# Patient Record
Sex: Female | Born: 1965 | Race: White | Hispanic: No | Marital: Married | Smoking: Never smoker
Health system: Southern US, Community
[De-identification: ages and names within clinical notes are randomized; demographics above are authoritative.]

## PROBLEM LIST (undated history)

## (undated) DIAGNOSIS — Z9889 Other specified postprocedural states: Secondary | ICD-10-CM

## (undated) DIAGNOSIS — R918 Other nonspecific abnormal finding of lung field: Secondary | ICD-10-CM

## (undated) DIAGNOSIS — K219 Gastro-esophageal reflux disease without esophagitis: Secondary | ICD-10-CM

## (undated) DIAGNOSIS — F419 Anxiety disorder, unspecified: Secondary | ICD-10-CM

## (undated) DIAGNOSIS — G894 Chronic pain syndrome: Secondary | ICD-10-CM

## (undated) DIAGNOSIS — IMO0002 Reserved for concepts with insufficient information to code with codable children: Secondary | ICD-10-CM

## (undated) DIAGNOSIS — C541 Malignant neoplasm of endometrium: Secondary | ICD-10-CM

## (undated) DIAGNOSIS — F32A Depression, unspecified: Secondary | ICD-10-CM

## (undated) DIAGNOSIS — E079 Disorder of thyroid, unspecified: Secondary | ICD-10-CM

## (undated) DIAGNOSIS — R011 Cardiac murmur, unspecified: Secondary | ICD-10-CM

## (undated) DIAGNOSIS — E039 Hypothyroidism, unspecified: Secondary | ICD-10-CM

## (undated) DIAGNOSIS — C55 Malignant neoplasm of uterus, part unspecified: Secondary | ICD-10-CM

## (undated) DIAGNOSIS — Z5189 Encounter for other specified aftercare: Secondary | ICD-10-CM

## (undated) DIAGNOSIS — I10 Essential (primary) hypertension: Secondary | ICD-10-CM

## (undated) DIAGNOSIS — R112 Nausea with vomiting, unspecified: Secondary | ICD-10-CM

## (undated) DIAGNOSIS — M797 Fibromyalgia: Secondary | ICD-10-CM

## (undated) DIAGNOSIS — G510 Bell's palsy: Secondary | ICD-10-CM

## (undated) DIAGNOSIS — R519 Headache, unspecified: Secondary | ICD-10-CM

## (undated) DIAGNOSIS — E785 Hyperlipidemia, unspecified: Secondary | ICD-10-CM

## (undated) DIAGNOSIS — J449 Chronic obstructive pulmonary disease, unspecified: Secondary | ICD-10-CM

## (undated) DIAGNOSIS — N63 Unspecified lump in unspecified breast: Secondary | ICD-10-CM

## (undated) DIAGNOSIS — T7840XA Allergy, unspecified, initial encounter: Secondary | ICD-10-CM

## (undated) DIAGNOSIS — N289 Disorder of kidney and ureter, unspecified: Secondary | ICD-10-CM

## (undated) DIAGNOSIS — J189 Pneumonia, unspecified organism: Secondary | ICD-10-CM

## (undated) DIAGNOSIS — Z9884 Bariatric surgery status: Secondary | ICD-10-CM

## (undated) DIAGNOSIS — F329 Major depressive disorder, single episode, unspecified: Secondary | ICD-10-CM

## (undated) DIAGNOSIS — E042 Nontoxic multinodular goiter: Secondary | ICD-10-CM

## (undated) DIAGNOSIS — M1990 Unspecified osteoarthritis, unspecified site: Secondary | ICD-10-CM

## (undated) DIAGNOSIS — D649 Anemia, unspecified: Secondary | ICD-10-CM

## (undated) DIAGNOSIS — N189 Chronic kidney disease, unspecified: Secondary | ICD-10-CM

## (undated) HISTORY — DX: Allergy, unspecified, initial encounter: T78.40XA

## (undated) HISTORY — DX: Essential (primary) hypertension: I10

## (undated) HISTORY — DX: Malignant neoplasm of endometrium: C54.1

## (undated) HISTORY — PX: LITHOTRIPSY: SUR834

## (undated) HISTORY — DX: Other nonspecific abnormal finding of lung field: R91.8

## (undated) HISTORY — DX: Encounter for other specified aftercare: Z51.89

## (undated) HISTORY — DX: Bariatric surgery status: Z98.84

## (undated) HISTORY — DX: Unspecified osteoarthritis, unspecified site: M19.90

## (undated) HISTORY — PX: OTHER SURGICAL HISTORY: SHX169

## (undated) HISTORY — DX: Anemia, unspecified: D64.9

## (undated) HISTORY — DX: Hyperlipidemia, unspecified: E78.5

## (undated) HISTORY — PX: TRIGGER FINGER RELEASE: SHX641

## (undated) HISTORY — DX: Chronic kidney disease, unspecified: N18.9

## (undated) HISTORY — DX: Anxiety disorder, unspecified: F41.9

## (undated) HISTORY — PX: WISDOM TOOTH EXTRACTION: SHX21

## (undated) HISTORY — PX: DILATION AND CURETTAGE OF UTERUS: SHX78

## (undated) HISTORY — DX: Chronic obstructive pulmonary disease, unspecified: J44.9

## (undated) HISTORY — PX: CHOLECYSTECTOMY: SHX55

## (undated) HISTORY — PX: ABDOMINAL HYSTERECTOMY: SHX81

---

## 1997-07-09 ENCOUNTER — Inpatient Hospital Stay (HOSPITAL_COMMUNITY): Admission: AD | Admit: 1997-07-09 | Discharge: 1997-07-09 | Payer: Self-pay | Admitting: *Deleted

## 2003-02-07 ENCOUNTER — Observation Stay (HOSPITAL_COMMUNITY): Admission: EM | Admit: 2003-02-07 | Discharge: 2003-02-08 | Payer: Self-pay | Admitting: Emergency Medicine

## 2003-02-07 ENCOUNTER — Encounter: Payer: Self-pay | Admitting: Emergency Medicine

## 2003-02-09 ENCOUNTER — Ambulatory Visit (HOSPITAL_COMMUNITY): Admission: RE | Admit: 2003-02-09 | Discharge: 2003-02-09 | Payer: Self-pay | Admitting: Internal Medicine

## 2003-02-09 ENCOUNTER — Encounter: Payer: Self-pay | Admitting: Internal Medicine

## 2003-02-14 ENCOUNTER — Encounter: Payer: Self-pay | Admitting: Internal Medicine

## 2003-02-14 ENCOUNTER — Ambulatory Visit (HOSPITAL_COMMUNITY): Admission: RE | Admit: 2003-02-14 | Discharge: 2003-02-14 | Payer: Self-pay | Admitting: Internal Medicine

## 2003-02-24 ENCOUNTER — Observation Stay (HOSPITAL_COMMUNITY): Admission: RE | Admit: 2003-02-24 | Discharge: 2003-02-25 | Payer: Self-pay | Admitting: General Surgery

## 2004-05-15 ENCOUNTER — Emergency Department (HOSPITAL_COMMUNITY): Admission: EM | Admit: 2004-05-15 | Discharge: 2004-05-16 | Payer: Self-pay | Admitting: *Deleted

## 2006-05-20 ENCOUNTER — Emergency Department (HOSPITAL_COMMUNITY): Admission: EM | Admit: 2006-05-20 | Discharge: 2006-05-20 | Payer: Self-pay | Admitting: Emergency Medicine

## 2008-06-10 ENCOUNTER — Emergency Department (HOSPITAL_COMMUNITY): Admission: EM | Admit: 2008-06-10 | Discharge: 2008-06-10 | Payer: Self-pay | Admitting: Emergency Medicine

## 2008-08-30 ENCOUNTER — Encounter: Admission: RE | Admit: 2008-08-30 | Discharge: 2008-08-30 | Payer: Self-pay | Admitting: Neurology

## 2009-03-26 ENCOUNTER — Emergency Department (HOSPITAL_COMMUNITY): Admission: EM | Admit: 2009-03-26 | Discharge: 2009-03-26 | Payer: Self-pay | Admitting: Emergency Medicine

## 2009-03-28 ENCOUNTER — Ambulatory Visit: Payer: Self-pay | Admitting: Cardiology

## 2009-03-28 ENCOUNTER — Encounter: Payer: Self-pay | Admitting: Emergency Medicine

## 2009-03-28 ENCOUNTER — Inpatient Hospital Stay (HOSPITAL_COMMUNITY): Admission: EM | Admit: 2009-03-28 | Discharge: 2009-04-03 | Payer: Self-pay | Admitting: Internal Medicine

## 2009-03-28 ENCOUNTER — Encounter (INDEPENDENT_AMBULATORY_CARE_PROVIDER_SITE_OTHER): Payer: Self-pay | Admitting: Internal Medicine

## 2009-03-28 ENCOUNTER — Ambulatory Visit: Payer: Self-pay | Admitting: Emergency Medicine

## 2009-04-03 ENCOUNTER — Encounter (INDEPENDENT_AMBULATORY_CARE_PROVIDER_SITE_OTHER): Payer: Self-pay | Admitting: Internal Medicine

## 2010-08-07 LAB — POCT I-STAT 3, ART BLOOD GAS (G3+)
Acid-Base Excess: 8 mmol/L — ABNORMAL HIGH (ref 0.0–2.0)
O2 Saturation: 91 %
Patient temperature: 99.3
TCO2: 34 mmol/L (ref 0–100)
pCO2 arterial: 44.9 mmHg (ref 35.0–45.0)
pCO2 arterial: 46.2 mmHg — ABNORMAL HIGH (ref 35.0–45.0)
pH, Arterial: 7.476 — ABNORMAL HIGH (ref 7.350–7.400)
pO2, Arterial: 59 mmHg — ABNORMAL LOW (ref 80.0–100.0)

## 2010-08-07 LAB — GLUCOSE, CAPILLARY
Glucose-Capillary: 111 mg/dL — ABNORMAL HIGH (ref 70–99)
Glucose-Capillary: 137 mg/dL — ABNORMAL HIGH (ref 70–99)
Glucose-Capillary: 143 mg/dL — ABNORMAL HIGH (ref 70–99)
Glucose-Capillary: 145 mg/dL — ABNORMAL HIGH (ref 70–99)
Glucose-Capillary: 151 mg/dL — ABNORMAL HIGH (ref 70–99)
Glucose-Capillary: 154 mg/dL — ABNORMAL HIGH (ref 70–99)
Glucose-Capillary: 158 mg/dL — ABNORMAL HIGH (ref 70–99)
Glucose-Capillary: 158 mg/dL — ABNORMAL HIGH (ref 70–99)
Glucose-Capillary: 179 mg/dL — ABNORMAL HIGH (ref 70–99)
Glucose-Capillary: 193 mg/dL — ABNORMAL HIGH (ref 70–99)
Glucose-Capillary: 203 mg/dL — ABNORMAL HIGH (ref 70–99)
Glucose-Capillary: 213 mg/dL — ABNORMAL HIGH (ref 70–99)
Glucose-Capillary: 215 mg/dL — ABNORMAL HIGH (ref 70–99)

## 2010-08-07 LAB — MAGNESIUM: Magnesium: 2.4 mg/dL (ref 1.5–2.5)

## 2010-08-07 LAB — CBC
HCT: 37.6 % (ref 36.0–46.0)
HCT: 30.2 % — ABNORMAL LOW (ref 36.0–46.0)
HCT: 30.4 % — ABNORMAL LOW (ref 36.0–46.0)
HCT: 32.2 % — ABNORMAL LOW (ref 36.0–46.0)
Hemoglobin: 12.5 g/dL (ref 12.0–15.0)
Hemoglobin: 10.3 g/dL — ABNORMAL LOW (ref 12.0–15.0)
Hemoglobin: 10.5 g/dL — ABNORMAL LOW (ref 12.0–15.0)
Hemoglobin: 10.6 g/dL — ABNORMAL LOW (ref 12.0–15.0)
Hemoglobin: 9.9 g/dL — ABNORMAL LOW (ref 12.0–15.0)
MCHC: 33.2 g/dL (ref 30.0–36.0)
MCHC: 33.2 g/dL (ref 30.0–36.0)
MCHC: 32.9 g/dL (ref 30.0–36.0)
MCHC: 33 g/dL (ref 30.0–36.0)
MCHC: 34 g/dL (ref 30.0–36.0)
MCV: 84.8 fL (ref 78.0–100.0)
MCV: 84.9 fL (ref 78.0–100.0)
MCV: 84.9 fL (ref 78.0–100.0)
MCV: 85 fL (ref 78.0–100.0)
MCV: 85.2 fL (ref 78.0–100.0)
Platelets: 372 10*3/uL (ref 150–400)
Platelets: 479 10*3/uL — ABNORMAL HIGH (ref 150–400)
Platelets: 585 10*3/uL — ABNORMAL HIGH (ref 150–400)
RBC: 3.58 MIL/uL — ABNORMAL LOW (ref 3.87–5.11)
RBC: 3.74 MIL/uL — ABNORMAL LOW (ref 3.87–5.11)
RBC: 3.77 MIL/uL — ABNORMAL LOW (ref 3.87–5.11)
RDW: 13.9 % (ref 11.5–15.5)
RDW: 13.9 % (ref 11.5–15.5)
RDW: 14.2 % (ref 11.5–15.5)
RDW: 14.4 % (ref 11.5–15.5)
WBC: 10.8 10*3/uL — ABNORMAL HIGH (ref 4.0–10.5)
WBC: 9.8 10*3/uL (ref 4.0–10.5)

## 2010-08-07 LAB — URINE CULTURE: Colony Count: >=100000

## 2010-08-07 LAB — BASIC METABOLIC PANEL
BUN: 6 mg/dL (ref 6–23)
BUN: 8 mg/dL (ref 6–23)
CO2: 26 mEq/L (ref 19–32)
CO2: 29 mEq/L (ref 19–32)
CO2: 29 mEq/L (ref 19–32)
CO2: 30 mEq/L (ref 19–32)
CO2: 31 mEq/L (ref 19–32)
Calcium: 8.7 mg/dL (ref 8.4–10.5)
Calcium: 9 mg/dL (ref 8.4–10.5)
Calcium: 8.7 mg/dL (ref 8.4–10.5)
Chloride: 101 mEq/L (ref 96–112)
Chloride: 96 mEq/L (ref 96–112)
Chloride: 97 mEq/L (ref 96–112)
Chloride: 97 mEq/L (ref 96–112)
Creatinine, Ser: 0.69 mg/dL (ref 0.4–1.2)
Creatinine, Ser: 0.59 mg/dL (ref 0.4–1.2)
Creatinine, Ser: 0.65 mg/dL (ref 0.4–1.2)
GFR calc Af Amer: >60 mL/min (ref >60)
GFR calc Af Amer: >60 mL/min (ref >60)
GFR calc Af Amer: >60 mL/min (ref >60)
GFR calc non Af Amer: >60 mL/min (ref >60)
GFR calc non Af Amer: >60 mL/min (ref >60)
Glucose, Bld: 156 mg/dL — ABNORMAL HIGH (ref 70–99)
Glucose, Bld: 234 mg/dL — ABNORMAL HIGH (ref 70–99)
Glucose, Bld: 201 mg/dL — ABNORMAL HIGH (ref 70–99)
Potassium: 3 mEq/L — ABNORMAL LOW (ref 3.5–5.1)
Potassium: 3.6 mEq/L (ref 3.5–5.1)
Potassium: 4.3 mEq/L (ref 3.5–5.1)
Sodium: 134 mEq/L — ABNORMAL LOW (ref 135–145)
Sodium: 133 mEq/L — ABNORMAL LOW (ref 135–145)
Sodium: 138 mEq/L (ref 135–145)

## 2010-08-07 LAB — BLOOD GAS, ARTERIAL
Acid-Base Excess: 0.2 mmol/L (ref 0.0–2.0)
Acid-Base Excess: 5.7 mmol/L — ABNORMAL HIGH (ref 0.0–2.0)
FIO2: 0.8 %
O2 Content: 4.5 L/min
O2 Saturation: 90.9 %
Patient temperature: 99.9
TCO2: 31.2 mmol/L (ref 0–100)
pCO2 arterial: 41.5 mmHg (ref 35.0–45.0)

## 2010-08-07 LAB — URINALYSIS, ROUTINE W REFLEX MICROSCOPIC
Bilirubin Urine: NEGATIVE
Glucose, UA: NEGATIVE mg/dL
Ketones, ur: NEGATIVE mg/dL
Leukocytes, UA: NEGATIVE
Nitrite: POSITIVE — AB
Protein, ur: NEGATIVE mg/dL
Specific Gravity, Urine: 1.03 (ref 1.005–1.030)
Urobilinogen, UA: 0.2 mg/dL (ref 0.0–1.0)
pH: 5.5 (ref 5.0–8.0)

## 2010-08-07 LAB — HEPATIC FUNCTION PANEL
Bilirubin, Direct: 0.1 mg/dL (ref 0.0–0.3)
Indirect Bilirubin: 0.8 mg/dL (ref 0.3–0.9)
Total Protein: 7 g/dL (ref 6.0–8.3)

## 2010-08-07 LAB — INFLUENZA A H1N1
Influenza A RNA: NOT DETECTED
Swine Influenza H1 Gene: NOT DETECTED

## 2010-08-07 LAB — CULTURE, BLOOD (ROUTINE X 2)
Culture: NO GROWTH
Report Status: 11272010
Report Status: 11292010

## 2010-08-07 LAB — DIFFERENTIAL
Basophils Absolute: 0 10*3/uL (ref 0.0–0.1)
Basophils Absolute: 0 10*3/uL (ref 0.0–0.1)
Basophils Relative: 0 % (ref 0–1)
Basophils Relative: 0 % (ref 0–1)
Eosinophils Relative: 1 % (ref 0–5)
Monocytes Absolute: 0.8 10*3/uL (ref 0.1–1.0)
Neutro Abs: 11.2 10*3/uL — ABNORMAL HIGH (ref 1.7–7.7)
Neutro Abs: 11.8 10*3/uL — ABNORMAL HIGH (ref 1.7–7.7)
Neutrophils Relative %: 88 % — ABNORMAL HIGH (ref 43–77)

## 2010-08-07 LAB — LACTIC ACID, PLASMA: Lactic Acid, Venous: 0.9 mmol/L (ref 0.5–2.2)

## 2010-08-07 LAB — URINE MICROSCOPIC-ADD ON

## 2010-08-07 LAB — COMPREHENSIVE METABOLIC PANEL
Albumin: 2.5 g/dL — ABNORMAL LOW (ref 3.5–5.2)
Alkaline Phosphatase: 79 U/L (ref 39–117)
BUN: 9 mg/dL (ref 6–23)
Creatinine, Ser: 0.53 mg/dL (ref 0.4–1.2)
Glucose, Bld: 126 mg/dL — ABNORMAL HIGH (ref 70–99)
Potassium: 3.9 mEq/L (ref 3.5–5.1)
Total Protein: 6.4 g/dL (ref 6.0–8.3)

## 2010-08-07 LAB — MRSA PCR SCREENING: MRSA by PCR: NEGATIVE

## 2010-08-07 LAB — PHOSPHORUS: Phosphorus: 2.8 mg/dL (ref 2.3–4.6)

## 2010-08-20 LAB — URINALYSIS, ROUTINE W REFLEX MICROSCOPIC
Glucose, UA: NEGATIVE mg/dL
Ketones, ur: NEGATIVE mg/dL
Leukocytes, UA: NEGATIVE
Nitrite: NEGATIVE
Protein, ur: NEGATIVE mg/dL
Urobilinogen, UA: 0.2 mg/dL (ref 0.0–1.0)

## 2010-08-20 LAB — POCT I-STAT, CHEM 8
BUN: 15 mg/dL (ref 6–23)
Calcium, Ion: 1.17 mmol/L (ref 1.12–1.32)
HCT: 43 % (ref 36.0–46.0)
Sodium: 138 mEq/L (ref 135–145)
TCO2: 29 mmol/L (ref 0–100)

## 2010-08-20 LAB — URINE MICROSCOPIC-ADD ON

## 2010-08-26 ENCOUNTER — Other Ambulatory Visit (HOSPITAL_COMMUNITY): Payer: Self-pay | Admitting: Family Medicine

## 2010-08-26 DIAGNOSIS — N6320 Unspecified lump in the left breast, unspecified quadrant: Secondary | ICD-10-CM

## 2010-09-04 ENCOUNTER — Encounter (HOSPITAL_COMMUNITY): Payer: Self-pay

## 2011-01-12 ENCOUNTER — Emergency Department (HOSPITAL_COMMUNITY): Payer: BC Managed Care – PPO

## 2011-01-12 ENCOUNTER — Emergency Department (HOSPITAL_COMMUNITY)
Admission: EM | Admit: 2011-01-12 | Discharge: 2011-01-12 | Disposition: A | Discharge status: Home / Self Care | Payer: BC Managed Care – PPO | Attending: Emergency Medicine | Admitting: Emergency Medicine

## 2011-01-12 DIAGNOSIS — T07XXXA Unspecified multiple injuries, initial encounter: Secondary | ICD-10-CM

## 2011-01-12 DIAGNOSIS — R51 Headache: Secondary | ICD-10-CM | POA: Insufficient documentation

## 2011-01-12 DIAGNOSIS — S060X0A Concussion without loss of consciousness, initial encounter: Secondary | ICD-10-CM | POA: Insufficient documentation

## 2011-01-12 DIAGNOSIS — M533 Sacrococcygeal disorders, not elsewhere classified: Secondary | ICD-10-CM | POA: Insufficient documentation

## 2011-01-12 DIAGNOSIS — M25519 Pain in unspecified shoulder: Secondary | ICD-10-CM | POA: Insufficient documentation

## 2011-01-12 DIAGNOSIS — S40019A Contusion of unspecified shoulder, initial encounter: Secondary | ICD-10-CM | POA: Insufficient documentation

## 2011-01-12 DIAGNOSIS — S060X9A Concussion with loss of consciousness of unspecified duration, initial encounter: Secondary | ICD-10-CM

## 2011-01-12 DIAGNOSIS — M542 Cervicalgia: Secondary | ICD-10-CM | POA: Insufficient documentation

## 2011-01-12 DIAGNOSIS — IMO0002 Reserved for concepts with insufficient information to code with codable children: Secondary | ICD-10-CM

## 2011-01-12 DIAGNOSIS — R42 Dizziness and giddiness: Secondary | ICD-10-CM | POA: Insufficient documentation

## 2011-01-12 HISTORY — DX: Major depressive disorder, single episode, unspecified: F32.9

## 2011-01-12 HISTORY — DX: Depression, unspecified: F32.A

## 2011-01-12 HISTORY — DX: Fibromyalgia: M79.7

## 2011-01-12 MED ORDER — OXYCODONE-ACETAMINOPHEN 5-325 MG PO TABS
1.0000 | ORAL_TABLET | Freq: Once | ORAL | Status: AC
Start: 2011-01-12 — End: 2011-01-12
  Administered 2011-01-12: 1 via ORAL
  Filled 2011-01-12: qty 1

## 2011-01-12 MED ORDER — ONDANSETRON 8 MG PO TBDP
8.0000 mg | ORAL_TABLET | Freq: Once | ORAL | Status: AC
  Administered 2011-01-12: 8 mg via ORAL
  Filled 2011-01-12: qty 1

## 2011-01-12 NOTE — ED Provider Notes (Signed)
History     CSN: [id number] Arrival date & time: 01/12/2011  9:44 AM  Chief Complaint  Patient presents with  . Fall  . Dizziness  . Nausea  . Blurred Vision   HPI Molly Berry is a 45 y.o. female who presents to the ED after being bucked off a horse yesterday morning. She reports hitting her head and right shoulder on the ground. Bystanders report that the horse may have kicked the patient in the back of the head. Since the injury the patient reports headache, coccyx pain, neck pain and dizziness and a knot to the back of the head. Denies abrasions or lacerations. Complains of feeling sore all over.  Past Medical History  Diagnosis Date  . Depression   . Fibromyalgia     Past Surgical History  Procedure Date  . Gastric bypass     History reviewed. No pertinent family history.  History  Substance Use Topics  . Smoking status: Never Smoker   . Smokeless tobacco: Not on file  . Alcohol Use: No    OB History    Grav Para Term Preterm Abortions TAB SAB Ect Mult Living                  Review of Systems  Constitutional: Positive for chills. Negative for fever, diaphoresis and fatigue.  HENT: Positive for neck pain and sinus pressure. Negative for ear pain, congestion, sore throat, facial swelling, neck stiffness and dental problem.   Eyes: Negative for photophobia, pain and discharge.  Respiratory: Negative for cough, chest tightness and wheezing.   Gastrointestinal: Positive for nausea. Negative for vomiting, abdominal pain, diarrhea, constipation and abdominal distention.  Genitourinary: Negative for dysuria, frequency, flank pain and difficulty urinating.  Musculoskeletal: Positive for back pain. Negative for myalgias and gait problem.       Difficulty walking due to pain.  Skin: Negative for color change, rash and wound.  Neurological: Positive for dizziness, light-headedness and headaches. Negative for speech difficulty, weakness and numbness.    Psychiatric/Behavioral: Negative for confusion and agitation.    Physical Exam  BP 122/63  Pulse 55  Temp(Src) 98.1 F (36.7 C) (Oral)  Resp 16  Ht 5\' 5"  (1.651 m)  Wt 211 lb (95.709 kg)  BMI 35.11 kg/m2  SpO2 100%  LMP 11/11/2010  Physical Exam  Nursing note and vitals reviewed. Constitutional: She is oriented to person, place, and time. She appears well-developed and well-nourished.  HENT:  Head: Head is with contusion.  Right Ear: Hearing, tympanic membrane, external ear and ear canal normal. No drainage.  Left Ear: Hearing, tympanic membrane, external ear and ear canal normal. No drainage.  Nose: Nose normal.  Mouth/Throat: Uvula is midline.       Hematoma parietal area. Tender on palpation.  Eyes: EOM are normal.  Neck: Neck supple.       Pain with ROM  Pulmonary/Chest: Effort normal.  Abdominal: Soft. There is no tenderness.  Musculoskeletal: Normal range of motion. She exhibits no edema.       Right shoulder tender with palpation. Tender coccyx area.  Neurological: She is alert and oriented to person, place, and time. No cranial nerve deficit.  Skin: Skin is warm and dry.  Psychiatric: She has a normal mood and affect.    ED Course  Procedures  Study Result     *RADIOLOGY REPORT*  Clinical Data: Fall with dizziness, nausea, blurred vision,  headache and neck pain.  CT HEAD WITHOUT CONTRAST  CT CERVICAL  SPINE WITHOUT CONTRAST  Technique: Multidetector CT imaging of the head and cervical spine  was performed following the standard protocol without intravenous  contrast. Multiplanar CT image reconstructions of the cervical  spine were also generated.  Comparison: Prior MRI of brain dated 08/30/2008.  CT HEAD  Findings: The brain has a normal appearance without evidence for  hemorrhage, acute infarction, hydrocephalus, or mass lesion. There  is no extra axial fluid collection. The skull and paranasal  sinuses are normal.  IMPRESSION:  Normal CT of the  head without contrast.  CT CERVICAL SPINE  Findings: The cervical spine shows normal alignment and no evidence  of fracture or subluxation. No significant degenerative changes  are present. No soft tissue swelling. Incidental note of  thyromegaly with heterogeneous appearance of the thyroid gland.  Findings are likely consistent with thyroid goiter.  IMPRESSION:  Normal CT of the cervical spine. Incidental thyroid goiter.  Original Report Authenticated By: Reola Calkins, M.D.   Assessment:  Concussion   Contusion right shoulder              Contusion coccyx  Plan:  Patient has percocet at home for her fibromyalgia   Follow up with Dr. Regino Schultze tomorrow   Return here for any problems   Discussed with patient's husband signs and symptoms to watch for       [facility], NP 01/12/11 1241

## 2011-01-12 NOTE — ED Notes (Signed)
Pt brought in after fall from a horse yesterday. Pt states she thinks horse was possibley kicked in back of head by horse. Pt denies LOC during fall. Pt states she has pain to head, tailbone,and general soreness. Pt denies numbness but c/o dizziness. Pt with bump to back of head. Pt A/Ox4 and able to speak in full sentences. No lacerations or wounds noted.

## 2011-01-12 NOTE — ED Notes (Signed)
c-collar applied per protocol [place] short. No neurologic deficits before or after application,

## 2011-01-12 NOTE — ED Notes (Signed)
Pt left the er stating no needs 

## 2011-01-12 NOTE — ED Provider Notes (Signed)
Medical screening examination/treatment/procedure(s) were performed by non-physician practitioner and as supervising physician I was immediately available for consultation/collaboration.   Charles B. Bernette Mayers, MD 01/12/11 1247

## 2011-02-07 ENCOUNTER — Emergency Department (HOSPITAL_COMMUNITY): Payer: BC Managed Care – PPO

## 2011-02-07 ENCOUNTER — Encounter (HOSPITAL_COMMUNITY): Payer: Self-pay | Admitting: *Deleted

## 2011-02-07 ENCOUNTER — Emergency Department (HOSPITAL_COMMUNITY)
Admission: EM | Admit: 2011-02-07 | Discharge: 2011-02-07 | Disposition: A | Discharge status: Home / Self Care | Payer: BC Managed Care – PPO | Attending: Emergency Medicine | Admitting: Emergency Medicine

## 2011-02-07 DIAGNOSIS — K5900 Constipation, unspecified: Secondary | ICD-10-CM | POA: Insufficient documentation

## 2011-02-07 DIAGNOSIS — R109 Unspecified abdominal pain: Secondary | ICD-10-CM | POA: Insufficient documentation

## 2011-02-07 DIAGNOSIS — K6289 Other specified diseases of anus and rectum: Secondary | ICD-10-CM | POA: Insufficient documentation

## 2011-02-07 DIAGNOSIS — Z79899 Other long term (current) drug therapy: Secondary | ICD-10-CM | POA: Insufficient documentation

## 2011-02-07 HISTORY — DX: Disorder of thyroid, unspecified: E07.9

## 2011-02-07 LAB — COMPREHENSIVE METABOLIC PANEL
AST: 19 U/L (ref 0–37)
BUN: 13 mg/dL (ref 6–23)
CO2: 28 mEq/L (ref 19–32)
Calcium: 10 mg/dL (ref 8.4–10.5)
Chloride: 99 mEq/L (ref 96–112)
Creatinine, Ser: 0.69 mg/dL (ref 0.50–1.10)
GFR calc Af Amer: >90 mL/min (ref >90)
GFR calc non Af Amer: >90 mL/min (ref >90)
Glucose, Bld: 147 mg/dL — ABNORMAL HIGH (ref 70–99)
Total Bilirubin: 0.5 mg/dL (ref 0.3–1.2)

## 2011-02-07 LAB — CBC
Hemoglobin: 13.7 g/dL (ref 12.0–15.0)
MCH: 28.7 pg (ref 26.0–34.0)
Platelets: 358 10*3/uL (ref 150–400)
RBC: 4.77 MIL/uL (ref 3.87–5.11)

## 2011-02-07 LAB — DIFFERENTIAL
Basophils Relative: 0 % (ref 0–1)
Eosinophils Absolute: 0.1 10*3/uL (ref 0.0–0.7)
Lymphs Abs: 1.5 10*3/uL (ref 0.7–4.0)
Monocytes Relative: 7 % (ref 3–12)
Neutro Abs: 4.8 10*3/uL (ref 1.7–7.7)
Neutrophils Relative %: 69 % (ref 43–77)

## 2011-02-07 MED ORDER — CIPROFLOXACIN HCL 500 MG PO TABS: 500.0000 mg | ORAL_TABLET | Freq: Two times a day (BID) | ORAL | Status: AC

## 2011-02-07 MED ORDER — METRONIDAZOLE 500 MG PO TABS: 500.0000 mg | ORAL_TABLET | Freq: Two times a day (BID) | ORAL | Status: AC

## 2011-02-07 MED ORDER — BISACODYL 10 MG RE SUPP
20.0000 mg | Freq: Once | RECTAL | Status: AC
  Administered 2011-02-07: 20 mg via RECTAL
  Filled 2011-02-07: qty 2

## 2011-02-07 MED ORDER — POLYETHYLENE GLYCOL 3350 17 GM/SCOOP PO POWD: 17.0000 g | Freq: Every day | ORAL | Status: AC

## 2011-02-07 MED ORDER — MAGNESIUM CITRATE PO SOLN
296.0000 mL | Freq: Once | ORAL | Status: AC
  Administered 2011-02-07: 296 mL via ORAL
  Filled 2011-02-07: qty 296

## 2011-02-07 NOTE — ED Provider Notes (Signed)
History     CSN: [id number] Arrival date & time: 02/07/2011  6:21 PM  Chief Complaint  Patient presents with  . Constipation    (Consider location/radiation/quality/duration/timing/severity/associated sxs/prior treatment) HPI Comments: Gastric bypass surgery several years ago. Presents with 4 days of constipation. Has not taken her senna and Colace. Attempted Fleet enemas without relief.   Patient is a 45 y.o. female presenting with constipation. The history is provided by the patient. No language interpreter was used.  Constipation  The current episode started 3 to 5 days ago. The onset was gradual. The problem occurs continuously. The problem has been gradually worsening. The pain is moderate. The stool is described as hard. Prior successful therapies include stool softeners and laxatives. Associated symptoms include abdominal pain and rectal pain. Pertinent negatives include no anorexia, no fever, no diarrhea, no nausea, no vomiting, no vaginal bleeding, no vaginal discharge, no chest pain, no headaches, no coughing and no difficulty breathing. Her past medical history is significant for abdominal surgery. Her past medical history does not include inflammatory bowel disease or recent antibiotic use.    Past Medical History  Diagnosis Date  . Depression   . Fibromyalgia   . Diabetes mellitus   . Thyroid disease     Past Surgical History  Procedure Date  . Gastric bypass   . Cholecystectomy     History reviewed. No pertinent family history.  History  Substance Use Topics  . Smoking status: Never Smoker   . Smokeless tobacco: Not on file  . Alcohol Use: No    OB History    Grav Para Term Preterm Abortions TAB SAB Ect Mult Living                  Review of Systems  Constitutional: Negative for fever, activity change and appetite change.  HENT: Negative for congestion, sore throat, rhinorrhea, neck pain and neck stiffness.   Respiratory: Negative for cough and  shortness of breath.   Cardiovascular: Negative for chest pain and palpitations.  Gastrointestinal: Positive for abdominal pain, constipation and rectal pain. Negative for nausea, vomiting, diarrhea and anorexia.  Genitourinary: Negative for dysuria, urgency, frequency, flank pain, vaginal bleeding and vaginal discharge.  Neurological: Negative for dizziness, weakness, light-headedness, numbness and headaches.  All other systems reviewed and are negative.    Allergies  Keflex; Penicillins; and Bee venom  Home Medications   Current Outpatient Rx  Name Route Sig Dispense Refill  . ALPRAZOLAM 1 MG PO TABS Oral Take 2 mg by mouth at bedtime. **Sometimes takes one every afternoon and one at bedtime for sleep**    . B COMPLEX VITAMINS PO CAPS Oral Take 1 capsule by mouth daily.      Marland Kitchen VITAMIN D PO Oral Take 1 capsule by mouth 2 (two) times daily.      . DULOXETINE HCL 60 MG PO CPEP Oral Take 60 mg by mouth every morning.     Marland Kitchen FERROUS SULFATE 325 (65 FE) MG PO TABS Oral Take 325 mg by mouth 2 (two) times daily.      Marland Kitchen LAMOTRIGINE 100 MG PO TABS Oral Take 100 mg by mouth 2 (two) times daily. Take one tablet every morning and one tablet at bedtime    . MULTI-VITAMIN/MINERALS PO TABS Oral Take 1 tablet by mouth every morning.     . OXYCODONE HCL 15 MG PO TABS Oral Take 15 mg by mouth every 4 (four) hours as needed. For pain    . TIZANIDINE HCL  4 MG PO TABS Oral Take 4 mg by mouth every 12 (twelve) hours.     Marland Kitchen ZINC GLUCONATE 50 MG PO TABS Oral Take 50 mg by mouth 2 (two) times daily.     . ALBUTEROL SULFATE HFA 108 (90 BASE) MCG/ACT IN AERS Inhalation Inhale 2 puffs into the lungs every 6 (six) hours as needed. For asthma     . CIPROFLOXACIN HCL 500 MG PO TABS Oral Take 1 tablet (500 mg total) by mouth every 12 (twelve) hours. 14 tablet 0  . DOCUSATE SODIUM 100 MG PO CAPS Oral Take 100 mg by mouth 2 (two) times daily.      Marland Kitchen METRONIDAZOLE 500 MG PO TABS Oral Take 1 tablet (500 mg total) by mouth  2 (two) times daily. 14 tablet 0  . POLYETHYLENE GLYCOL 3350 PO POWD Oral Take 17 g by mouth daily. 255 g 0    BP 164/87  Pulse 66  Temp(Src) 98.5 F (36.9 C) (Oral)  Resp 18  SpO2 100%  LMP 12/06/2010  Physical Exam  Nursing note and vitals reviewed. Constitutional: She is oriented to person, place, and time. She appears well-developed and well-nourished. She appears distressed (uncomfortable appearing).  HENT:  Head: Normocephalic and atraumatic.  Mouth/Throat: Oropharynx is clear and moist.  Eyes: Conjunctivae and EOM are normal. Pupils are equal, round, and reactive to light.  Neck: Normal range of motion. Neck supple.  Cardiovascular: Normal rate, regular rhythm, normal heart sounds and intact distal pulses.  Exam reveals no gallop and no friction rub.   No murmur heard. Pulmonary/Chest: Effort normal and breath sounds normal. No respiratory distress.  Abdominal: Soft. Bowel sounds are normal. There is tenderness (diffusely). There is no rebound and no guarding.  Genitourinary: Rectal exam shows tenderness. Rectal exam shows no external hemorrhoid and no internal hemorrhoid.       Patient with a stool impaction. She was manually disimpacted with serial digital rectal exams. Hard stool was palpated and removed  Musculoskeletal: Normal range of motion. She exhibits no tenderness.  Neurological: She is alert and oriented to person, place, and time.  Skin: Skin is warm and dry. No rash noted.    ED Course  Procedures (including critical care time)  Labs Reviewed  COMPREHENSIVE METABOLIC PANEL - Abnormal; Notable for the following:    Glucose, Bld 147 (*)    Alkaline Phosphatase 119 (*)    All other components within normal limits  CBC  DIFFERENTIAL   Ct Abdomen Pelvis Wo Contrast  02/07/2011  *RADIOLOGY REPORT*  Clinical Data: Abdominal pain, evaluate for small bowel obstruction.  CT ABDOMEN AND PELVIS WITHOUT CONTRAST  Technique:  Multidetector CT imaging of the abdomen  and pelvis was performed following the standard protocol without intravenous contrast.  Comparison: 03/28/2009  Findings: Limited images through the lung bases demonstrate no significant appreciable abnormality. The heart size is within normal limits. No pleural or pericardial effusion.  Abdominal organ evaluation is limited without intravenous contrast. Within this limitation, unremarkable liver, spleen, pancreas, adrenal glands.  Status post cholecystectomy.  No biliary ductal dilatation.  Bilateral lobular renal contours, similar to prior.  Bilateral nonobstructing renal calculi.  No hydronephrosis or hydroureter. No ureteral calculi.  There are surgical changes of gastric bypass.  No bowel obstruction.  Circumferential thickening of the rectum with stranding of the mesorectal fat and fascia.  No free or loculated fluid collection.  No free intraperitoneal air.  The colon otherwise demonstrates mild diverticulosis without acute abnormality.  Normal appendix.  No lymphadenopathy.  No aneurysmal dilatation of the aorta. Retroaortic left renal vein.  Thin-walled bladder.  Unremarkable uterus and adnexa within limitations of noncontrast CT.  No acute osseous abnormality.  IMPRESSION: Circumferential rectal wall thickening with stranding of the mesorectal fat.  This may represent proctitis. Colonoscopy should be considered when symptoms resolve to exclude an underlying lesion.  Status post gastric bypass.  No bowel obstruction.  Bilateral nonobstructing renal stones.  No hydronephrosis.  Original Report Authenticated By: Waneta Martins, M.D.   Dg Abd Acute W/chest  02/07/2011  *RADIOLOGY REPORT*  Clinical Data: Constipation.  ACUTE ABDOMEN SERIES (ABDOMEN 2 VIEW & CHEST 1 VIEW)  Comparison: Chest x-ray 03/31/2009  Findings: Heart is borderline in size.  Lungs are clear.  No effusions.  Prior cholecystectomy.  Prominent left abdominal small bowel loops concerning for small bowel obstruction.  Stool and gas  within the colon.  No free air.  No organomegaly or suspicious calcification. No acute bony abnormality.  IMPRESSION: Prominent left abdominal small bowel loops concerning for small bowel obstruction.  Prior cholecystectomy.  Original Report Authenticated By: Cyndie Chime, M.D.     1. Constipation       MDM  Laboratory studies were performed relatively unremarkable. An acute abdominal series was performed and showed dilated loops small bowel. The comment on the possibility of obstruction therefore a CT abdomen and pelvis with by mouth contrast only was obtained. There is no evidence of obstruction however there was some inflammation in the distal rectum. This is likely secondary to constipation however there is a possibility of proctitis. I did prescribe Cipro and Flagyl. Instructed her to followup with her primary care physician. The manual disimpaction was performed in the emergency department followed by dulcolax suppository and magnesium citrate. The patient had a large bowel movement following the manual disimpaction. She'll be discharged home with an aggressive course of MiraLAX. I will also discharge her home with Cipro and Flagyl. She is provided signs and symptoms for which to return to the emergency department. I encouraged her to to continue taking her prescribed senna and Colace as directed        Dayton Bailiff, MD 02/07/11 2211

## 2011-02-07 NOTE — ED Notes (Signed)
Passed a large amount of stool after the suppossitory

## 2011-02-07 NOTE — ED Notes (Signed)
Iv attempted and decided CT to be done without contrast due to poor vein selection

## 2011-02-07 NOTE — ED Notes (Signed)
Pt c/o constipation x 4 days. Also c/o pain at rectal area and lower abdomen. Pt had fever and vomiting 2 days ago.

## 2011-05-06 HISTORY — PX: GASTRIC BYPASS: SHX52

## 2011-06-11 ENCOUNTER — Other Ambulatory Visit: Payer: Self-pay | Admitting: Obstetrics & Gynecology

## 2011-06-12 NOTE — Patient Instructions (Addendum)
20 [name]  06/12/2011   Your procedure is scheduled on:  06/18/2011  Report to [facility] at  615  AM.  Call this number if you have problems the morning of surgery: [phone]   Remember:   Do not eat food:After Midnight.  May have clear liquids:until Midnight .  Clear liquids include soda, tea, black coffee, apple or grape juice, broth.  Take these medicines the morning of surgery with A SIP OF WATER: none   Do not wear jewelry, make-up or nail polish.  Do not wear lotions, powders, or perfumes. You may wear deodorant.  Do not shave 48 hours prior to surgery.  Do not bring valuables to the hospital.  Contacts, dentures or bridgework may not be worn into surgery.  Leave suitcase in the car. After surgery it may be brought to your room.  For patients admitted to the hospital, checkout time is 11:00 AM the day of discharge.   Patients discharged the day of surgery will not be allowed to drive home.  Name and phone number of your driver: family  Special Instructions: CHG Shower Use Special Wash: 1/2 bottle night before surgery and 1/2 bottle morning of surgery.   Please read over the following fact sheets that you were given: Pain Booklet, MRSA Information, Surgical Site Infection Prevention, Anesthesia Post-op Instructions and Care and Recovery After Surgery Endometrial Ablation Endometrial ablation removes the lining of the uterus (endometrium). It is usually a same day, outpatient treatment. Ablation helps avoid major surgery (such as a hysterectomy). A hysterectomy is removal of the cervix and uterus. Endometrial ablation has less risk and complications, has a shorter recovery period and is less expensive. After endometrial ablation, most women will have little or no menstrual bleeding. You may not keep your fertility. Pregnancy is no longer likely after this procedure but if you are pre-menopausal, you still need to use a reliable method of birth control following the procedure  because pregnancy can occur. REASONS TO HAVE THE PROCEDURE MAY INCLUDE:  Heavy periods.   Bleeding that is causing anemia.   Anovulatory bleeding, very irregular, bleeding.   Bleeding submucous fibroids (on the lining inside the uterus) if they are smaller than 3 centimeters.  REASONS NOT TO HAVE THE PROCEDURE MAY INCLUDE:  You wish to have more children.   You have a pre-cancerous or cancerous problem. The cause of any abnormal bleeding must be diagnosed before having the procedure.   You have pain coming from the uterus.   You have a submucus fibroid larger than 3 centimeters.   You recently had a baby.   You recently had an infection in the uterus.   You have a severe retro-flexed, tipped uterus and cannot insert the instrument to do the ablation.   You had a Cesarean section or deep major surgery on the uterus.   The inner cavity of the uterus is too large for the endometrial ablation instrument.  RISKS AND COMPLICATIONS   Perforation of the uterus.   Bleeding.   Infection of the uterus, bladder or vagina.   Injury to surrounding organs.   Cutting the cervix.   An air bubble to the lung (air embolus).   Pregnancy following the procedure.   Failure of the procedure to help the problem requiring hysterectomy.   Decreased ability to diagnose cancer in the lining of the uterus.  BEFORE THE PROCEDURE  The lining of the uterus must be tested to make sure there is no pre-cancerous or cancer  cells present.   Medications may be given to make the lining of the uterus thinner.   Ultrasound may be used to evaluate the size and look for abnormalities of the uterus.   Future pregnancy is not desired.  PROCEDURE  There are different ways to destroy the lining of the uterus.   Resectoscope - radio frequency-alternating electric current is the most common one used.   Cryotherapy - freezing the lining of the uterus.   Heated Free Liquid - heated salt (saline)  solution inserted into the uterus.   Microwave - uses high energy microwaves in the uterus.   Thermal Balloon - a catheter with a balloon tip is inserted into the uterus and filled with heated fluid.  Your caregiver will talk with you about the method used in this clinic. They will also instruct you on the pros and cons of the procedure. Endometrial ablation is performed along with a procedure called operative hysteroscopy. A narrow viewing tube is inserted through the birth canal (vagina) and through the cervix into the uterus. A tiny camera attached to the viewing tube (hysteroscope) allows the uterine cavity to be shown on a TV monitor during surgery. Your uterus is filled with a harmless liquid to make the procedure easier. The lining of the uterus is then removed. The lining can also be removed with a resectoscope which allows your surgeon to cut away the lining of the uterus under direct vision. Usually, you will be able to go home within an hour after the procedure. HOME CARE INSTRUCTIONS   Do not drive for 24 hours.   No tampons, douching or intercourse for 2 weeks or until your caregiver approves.   Rest at home for 24 to 48 hours. You may then resume normal activities unless told differently by your caregiver.   Take your temperature two times a day for 4 days, and record it.   Take any medications your caregiver has ordered, as directed.   Use some form of contraception if you are pre-menopausal and do not want to get pregnant.  Bleeding after the procedure is normal. It varies from light spotting and mildly watery to bloody discharge for 4 to 6 weeks. You may also have mild cramping. Only take over-the-counter or prescription medicines for pain, discomfort, or fever as directed by your caregiver. Do not use aspirin, as this may aggravate bleeding. Frequent urination during the first 24 hours is normal. You will not know how effective your surgery is until at least 3 months after the  surgery. SEEK IMMEDIATE MEDICAL CARE IF:   Bleeding is heavier than a normal menstrual cycle.   An oral temperature above 102 F (38.9 C) develops.   You have increasing cramps or pains not relieved with medication or develop belly (abdominal) pain which does not seem to be related to the same area of earlier cramping and pain.   You are light headed, weak or have fainting episodes.   You develop pain in the shoulder strap areas.   You have chest or leg pain.   You have abnormal vaginal discharge.   You have painful urination.  Document Released: 02/29/2004 Document Revised: 01/01/2011 Document Reviewed: 05/29/2007 [facility] Patient Information 2012 [place], [place].Hysteroscopy Hysteroscopy is a procedure used for looking inside the womb (uterus). It may be done for many different reasons, including:  To evaluate abnormal bleeding, fibroid (benign, noncancerous) tumors, polyps, scar tissue (adhesions), and possibly cancer of the uterus.   To look for lumps (tumors) and other  uterine growths.   To look for causes of why a woman cannot get pregnant (infertility), causes of recurrent loss of pregnancy (miscarriages), or a lost intrauterine device (IUD).   To perform a sterilization by blocking the fallopian tubes from inside the uterus.  A hysteroscopy should be done right after a menstrual period to be sure you are not pregnant. LET YOUR CAREGIVER KNOW ABOUT:   Allergies.   Medicines taken, including herbs, eyedrops, over-the-counter medicines, and creams.   Use of steroids (by mouth or creams).   Previous problems with anesthetics or numbing medicines.   History of bleeding or blood problems.   History of blood clots.   Possibility of pregnancy, if this applies.   Previous surgery.   Other health problems.  RISKS AND COMPLICATIONS   Putting a hole in the uterus.   Excessive bleeding.   Infection.   Damage to the cervix.   Injury to other organs.   Allergic  reaction to medicines.   Too much fluid used in the uterus for the procedure.  BEFORE THE PROCEDURE   Do not take aspirin or blood thinners for a week before the procedure, or as directed. It can cause bleeding.   Arrive at least 60 minutes before the procedure or as directed to read and sign the necessary forms.   Arrange for someone to take you home after the procedure.   If you smoke, do not smoke for 2 weeks before the procedure.  PROCEDURE   Your caregiver may give you medicine to relax you. He or she may also give you a medicine that numbs the area around the cervix (local anesthetic) or a medicine that makes you sleep (general anesthesia).   Sometimes, a medicine is placed in the cervix the day before the procedure. This medicine makes the cervix have a larger opening (dilate). This makes it easier for the instrument to be inserted into the uterus.   A small instrument (hysteroscope) is inserted through the vagina into the uterus. This instrument is similar to a pencil-sized telescope with a light.   During the procedure, air or a liquid is put into the uterus, which allows the surgeon to see better.   Sometimes, tissue is gently scraped from inside the uterus. These tissue samples are sent to a specialist who looks at tissue samples (pathologist). The pathologist will give a report to your caregiver. This will help your caregiver decide if further treatment is necessary. The report will also help your caregiver decide on the best treatment if the test comes back abnormal.  AFTER THE PROCEDURE   If you had a general anesthetic, you may be groggy for a couple hours after the procedure.   If you had a local anesthetic, you will be advised to rest at the surgical center or caregiver's office until you are stable and feel ready to go home.   You may have some cramping for a couple days.   You may have bleeding, which varies from light spotting for a few days to menstrual-like  bleeding for up to 3 to 7 days. This is normal.   Have someone take you home.  FINDING OUT THE RESULTS OF YOUR TEST Not all test results are available during your visit. If your test results are not back during the visit, make an appointment with your caregiver to find out the results. Do not assume everything is normal if you have not heard from your caregiver or the medical facility. It is important for  you to follow up on all of your test results. HOME CARE INSTRUCTIONS   Do not drive for 24 hours or as instructed.   Only take over-the-counter or prescription medicines for pain, discomfort, or fever as directed by your caregiver.   Do not take aspirin. It can cause or aggravate bleeding.   Do not drive or drink alcohol while taking pain medicine.   You may resume your usual diet.   Do not use tampons, douche, or have sexual intercourse for 2 weeks, or as advised by your caregiver.   Rest and sleep for the first 24 to 48 hours.   Take your temperature twice a day for 4 to 5 days. Write it down. Give these temperatures to your caregiver if they are abnormal (above 98.6 F or 37.0 C).   Take medicines your caregiver has ordered as directed.   Follow your caregiver's advice regarding diet, exercise, lifting, driving, and general activities.   Take showers instead of baths for 2 weeks, or as recommended by your caregiver.   If you develop constipation:   Take a mild laxative with the advice of your caregiver.   Eat bran foods.   Drink enough water and fluids to keep your urine clear or pale yellow.   Try to have someone with you or available to you for the first 24 to 48 hours, especially if you had a general anesthetic.   Make sure you and your family understand everything about your operation and recovery.   Follow your caregiver's advice regarding follow-up appointments and Pap smears.  SEEK MEDICAL CARE IF:   You feel dizzy or lightheaded.   You feel sick to your  stomach (nauseous).   You develop abnormal vaginal discharge.   You develop a rash.   You have an abnormal reaction or allergy to your medicine.   You need stronger pain medicine.  SEEK IMMEDIATE MEDICAL CARE IF:   Bleeding is heavier than a normal menstrual period or you have blood clots.   You have an oral temperature above 102 F (38.9 C), not controlled by medicine.   You have increasing cramps or pains not relieved with medicine.   You develop belly (abdominal) pain that does not seem to be related to the same area of earlier cramping and pain.   You pass out.   You develop pain in the tops of your shoulders (shoulder strap areas).   You develop shortness of breath.  MAKE SURE YOU:   Understand these instructions.   Will watch your condition.   Will get help right away if you are not doing well or get worse.  Document Released: 07/28/2000 Document Revised: 01/01/2011 Document Reviewed: 11/20/2008 [facility] Patient Information 2012 [place], [place].PATIENT INSTRUCTIONS POST-ANESTHESIA  IMMEDIATELY FOLLOWING SURGERY:  Do not drive or operate machinery for the first twenty four hours after surgery.  Do not make any important decisions for twenty four hours after surgery or while taking narcotic pain medications or sedatives.  If you develop intractable nausea and vomiting or a severe headache please notify your doctor immediately.  FOLLOW-UP:  Please make an appointment with your surgeon as instructed. You do not need to follow up with anesthesia unless specifically instructed to do so.  WOUND CARE INSTRUCTIONS (if applicable):  Keep a dry clean dressing on the anesthesia/puncture wound site if there is drainage.  Once the wound has quit draining you may leave it open to air.  Generally you should leave the bandage intact for twenty four  hours unless there is drainage.  If the epidural site drains for more than 36-48 hours please call the anesthesia department.  QUESTIONS?:   Please feel free to call your physician or the hospital operator if you have any questions, and they will be happy to assist you.     [facility] Anesthesia Department [address] [place] [phone]

## 2011-06-13 ENCOUNTER — Encounter (HOSPITAL_COMMUNITY): Payer: Self-pay | Admitting: Pharmacy Technician

## 2011-06-13 ENCOUNTER — Encounter (HOSPITAL_COMMUNITY): Payer: Self-pay

## 2011-06-13 ENCOUNTER — Encounter (HOSPITAL_COMMUNITY)
Admission: RE | Admit: 2011-06-13 | Discharge: 2011-06-13 | Disposition: A | Discharge status: Home / Self Care | Payer: BC Managed Care – PPO | Source: Ambulatory Visit | Attending: Obstetrics & Gynecology | Admitting: Obstetrics & Gynecology

## 2011-06-13 HISTORY — DX: Nausea with vomiting, unspecified: R11.2

## 2011-06-13 HISTORY — DX: Hypothyroidism, unspecified: E03.9

## 2011-06-13 HISTORY — DX: Other specified postprocedural states: R11.2

## 2011-06-13 HISTORY — DX: Other specified postprocedural states: Z98.890

## 2011-06-13 LAB — COMPREHENSIVE METABOLIC PANEL
ALT: 16 U/L (ref 0–35)
AST: 20 U/L (ref 0–37)
Albumin: 3.3 g/dL — ABNORMAL LOW (ref 3.5–5.2)
Alkaline Phosphatase: 94 U/L (ref 39–117)
Chloride: 102 mEq/L (ref 96–112)
Potassium: 4.4 mEq/L (ref 3.5–5.1)
Sodium: 138 mEq/L (ref 135–145)
Total Protein: 6 g/dL (ref 6.0–8.3)

## 2011-06-13 LAB — URINALYSIS, ROUTINE W REFLEX MICROSCOPIC
Bilirubin Urine: NEGATIVE
Glucose, UA: NEGATIVE mg/dL
Hgb urine dipstick: NEGATIVE
Nitrite: POSITIVE — AB
Specific Gravity, Urine: 1.02 (ref 1.005–1.030)
pH: 5.5 (ref 5.0–8.0)

## 2011-06-13 LAB — CBC
Hemoglobin: 12.2 g/dL (ref 12.0–15.0)
MCHC: 33 g/dL (ref 30.0–36.0)
RDW: 13.4 % (ref 11.5–15.5)
WBC: 5.1 10*3/uL (ref 4.0–10.5)

## 2011-06-13 LAB — URINE MICROSCOPIC-ADD ON

## 2011-06-13 LAB — SURGICAL PCR SCREEN
MRSA, PCR: NEGATIVE
Staphylococcus aureus: NEGATIVE

## 2011-06-18 ENCOUNTER — Other Ambulatory Visit: Payer: Self-pay | Admitting: Obstetrics & Gynecology

## 2011-06-18 ENCOUNTER — Encounter (HOSPITAL_COMMUNITY): Payer: Self-pay

## 2011-06-18 ENCOUNTER — Encounter (HOSPITAL_COMMUNITY)
Admission: RE | Disposition: A | Discharge status: Home / Self Care | Payer: Self-pay | Source: Ambulatory Visit | Attending: Obstetrics & Gynecology

## 2011-06-18 ENCOUNTER — Ambulatory Visit (HOSPITAL_COMMUNITY): Payer: BC Managed Care – PPO | Admitting: Anesthesiology

## 2011-06-18 ENCOUNTER — Ambulatory Visit (HOSPITAL_COMMUNITY)
Admission: RE | Admit: 2011-06-18 | Discharge: 2011-06-18 | Disposition: A | Discharge status: Home / Self Care | Payer: BC Managed Care – PPO | Source: Ambulatory Visit | Attending: Obstetrics & Gynecology | Admitting: Obstetrics & Gynecology

## 2011-06-18 ENCOUNTER — Encounter (HOSPITAL_COMMUNITY): Payer: Self-pay | Admitting: Anesthesiology

## 2011-06-18 DIAGNOSIS — Z01812 Encounter for preprocedural laboratory examination: Secondary | ICD-10-CM | POA: Insufficient documentation

## 2011-06-18 DIAGNOSIS — Z9889 Other specified postprocedural states: Secondary | ICD-10-CM

## 2011-06-18 DIAGNOSIS — N920 Excessive and frequent menstruation with regular cycle: Secondary | ICD-10-CM | POA: Insufficient documentation

## 2011-06-18 DIAGNOSIS — N8500 Endometrial hyperplasia, unspecified: Secondary | ICD-10-CM | POA: Insufficient documentation

## 2011-06-18 DIAGNOSIS — N840 Polyp of corpus uteri: Secondary | ICD-10-CM | POA: Insufficient documentation

## 2011-06-18 DIAGNOSIS — N946 Dysmenorrhea, unspecified: Secondary | ICD-10-CM | POA: Insufficient documentation

## 2011-06-18 HISTORY — PX: HYSTEROSCOPY WITH D & C: SHX1775

## 2011-06-18 HISTORY — PX: DILATION AND CURETTAGE OF UTERUS: SHX78

## 2011-06-18 LAB — GLUCOSE, CAPILLARY: Glucose-Capillary: 87 mg/dL (ref 70–99)

## 2011-06-18 SURGERY — DILATATION AND CURETTAGE /HYSTEROSCOPY
Anesthesia: General | Wound class: Clean Contaminated

## 2011-06-18 MED ORDER — MIDAZOLAM HCL 2 MG/2ML IJ SOLN
INTRAMUSCULAR | Status: AC
  Filled 2011-06-18: qty 2

## 2011-06-18 MED ORDER — ONDANSETRON HCL 8 MG PO TABS: 8.0000 mg | ORAL_TABLET | Freq: Three times a day (TID) | ORAL | Status: AC | PRN

## 2011-06-18 MED ORDER — MIDAZOLAM HCL 2 MG/2ML IJ SOLN
1.0000 mg | INTRAMUSCULAR | Status: DC | PRN
  Administered 2011-06-18 (×2): 2 mg via INTRAVENOUS

## 2011-06-18 MED ORDER — FENTANYL CITRATE 0.05 MG/ML IJ SOLN
INTRAMUSCULAR | Status: AC
  Filled 2011-06-18: qty 2

## 2011-06-18 MED ORDER — KETOROLAC TROMETHAMINE 10 MG PO TABS: 10.0000 mg | ORAL_TABLET | Freq: Three times a day (TID) | ORAL | Status: AC | PRN

## 2011-06-18 MED ORDER — CLINDAMYCIN PHOSPHATE 900 MG/50ML IV SOLN: 900.0000 mg | INTRAVENOUS | Status: DC

## 2011-06-18 MED ORDER — MIDAZOLAM HCL 2 MG/2ML IJ SOLN
INTRAMUSCULAR | Status: AC
  Administered 2011-06-18: 2 mg via INTRAVENOUS
  Filled 2011-06-18: qty 2

## 2011-06-18 MED ORDER — PROPOFOL 10 MG/ML IV EMUL
INTRAVENOUS | Status: DC | PRN
  Administered 2011-06-18: 150 mg via INTRAVENOUS
  Administered 2011-06-18: 30 mg via INTRAVENOUS

## 2011-06-18 MED ORDER — SCOPOLAMINE 1 MG/3DAYS TD PT72
MEDICATED_PATCH | TRANSDERMAL | Status: AC
  Administered 2011-06-18: 1.5 mg via TRANSDERMAL
  Filled 2011-06-18: qty 1

## 2011-06-18 MED ORDER — KETOROLAC TROMETHAMINE 30 MG/ML IJ SOLN
INTRAMUSCULAR | Status: AC
  Administered 2011-06-18: 30 mg via INTRAVENOUS
  Filled 2011-06-18: qty 1

## 2011-06-18 MED ORDER — KETOROLAC TROMETHAMINE 30 MG/ML IJ SOLN
30.0000 mg | Freq: Once | INTRAMUSCULAR | Status: AC
  Administered 2011-06-18: 30 mg via INTRAVENOUS

## 2011-06-18 MED ORDER — FENTANYL CITRATE 0.05 MG/ML IJ SOLN
25.0000 ug | INTRAMUSCULAR | Status: DC | PRN
  Administered 2011-06-18: 25 ug via INTRAVENOUS

## 2011-06-18 MED ORDER — CLINDAMYCIN PHOSPHATE 900 MG/50ML IV SOLN
INTRAVENOUS | Status: AC
  Administered 2011-06-18: 900 mg via INTRAVENOUS
  Filled 2011-06-18: qty 50

## 2011-06-18 MED ORDER — DEXAMETHASONE SODIUM PHOSPHATE 4 MG/ML IJ SOLN
INTRAMUSCULAR | Status: AC
  Administered 2011-06-18: 4 mg via INTRAVENOUS
  Filled 2011-06-18: qty 1

## 2011-06-18 MED ORDER — DEXAMETHASONE SODIUM PHOSPHATE 4 MG/ML IJ SOLN
4.0000 mg | Freq: Once | INTRAMUSCULAR | Status: AC
  Administered 2011-06-18: 4 mg via INTRAVENOUS

## 2011-06-18 MED ORDER — CIPROFLOXACIN IN D5W 400 MG/200ML IV SOLN: 400.0000 mg | INTRAVENOUS | Status: DC

## 2011-06-18 MED ORDER — SCOPOLAMINE 1 MG/3DAYS TD PT72
1.0000 | MEDICATED_PATCH | Freq: Once | TRANSDERMAL | Status: DC
  Administered 2011-06-18: 1.5 mg via TRANSDERMAL

## 2011-06-18 MED ORDER — CIPROFLOXACIN IN D5W 400 MG/200ML IV SOLN
INTRAVENOUS | Status: AC
  Administered 2011-06-18: 400 mg via INTRAVENOUS
  Filled 2011-06-18: qty 200

## 2011-06-18 MED ORDER — LACTATED RINGERS IV SOLN
INTRAVENOUS | Status: DC
  Administered 2011-06-18: 09:00:00 via INTRAVENOUS

## 2011-06-18 MED ORDER — LIDOCAINE HCL (PF) 1 % IJ SOLN
INTRAMUSCULAR | Status: AC
  Filled 2011-06-18: qty 5

## 2011-06-18 MED ORDER — FENTANYL CITRATE 0.05 MG/ML IJ SOLN
INTRAMUSCULAR | Status: AC
  Administered 2011-06-18: 25 ug via INTRAVENOUS
  Filled 2011-06-18: qty 2

## 2011-06-18 MED ORDER — ONDANSETRON HCL 4 MG/2ML IJ SOLN: 4.0000 mg | Freq: Once | INTRAMUSCULAR | Status: DC | PRN

## 2011-06-18 MED ORDER — PROPOFOL 10 MG/ML IV EMUL
INTRAVENOUS | Status: AC
  Filled 2011-06-18: qty 20

## 2011-06-18 MED ORDER — FENTANYL CITRATE 0.05 MG/ML IJ SOLN
INTRAMUSCULAR | Status: DC | PRN
Start: 2011-06-18 — End: 2011-06-18
  Administered 2011-06-18 (×2): 25 ug via INTRAVENOUS
  Administered 2011-06-18: 50 ug via INTRAVENOUS

## 2011-06-18 MED ORDER — ONDANSETRON HCL 4 MG/2ML IJ SOLN
INTRAMUSCULAR | Status: AC
  Administered 2011-06-18: 4 mg via INTRAVENOUS
  Filled 2011-06-18: qty 2

## 2011-06-18 MED ORDER — ONDANSETRON HCL 4 MG/2ML IJ SOLN
4.0000 mg | Freq: Once | INTRAMUSCULAR | Status: AC
  Administered 2011-06-18: 4 mg via INTRAVENOUS

## 2011-06-18 MED ORDER — SODIUM CHLORIDE 0.9 % IR SOLN
Status: DC | PRN
  Administered 2011-06-18: 3000 mL

## 2011-06-18 SURGICAL SUPPLY — 31 items
BAG DECANTER FOR FLEXI CONT (MISCELLANEOUS) ×2 IMPLANT
BAG HAMPER (MISCELLANEOUS) ×2 IMPLANT
CATH THERMACHOICE III (CATHETERS) ×1 IMPLANT
CLOTH BEACON ORANGE TIMEOUT ST (SAFETY) ×2 IMPLANT
COVER LIGHT HANDLE STERIS (MISCELLANEOUS) ×4 IMPLANT
FORMALIN 10 PREFIL 120ML (MISCELLANEOUS) ×2 IMPLANT
GAUZE SPONGE 4X4 16PLY XRAY LF (GAUZE/BANDAGES/DRESSINGS) ×2 IMPLANT
GLOVE BIOGEL PI IND STRL 8 (GLOVE) ×1 IMPLANT
GLOVE BIOGEL PI INDICATOR 8 (GLOVE) ×1
GLOVE ECLIPSE 6.5 STRL STRAW (GLOVE) ×1 IMPLANT
GLOVE ECLIPSE 7.0 STRL STRAW (GLOVE) ×1 IMPLANT
GLOVE ECLIPSE 8.0 STRL XLNG CF (GLOVE) ×2 IMPLANT
GLOVE INDICATOR 7.0 STRL GRN (GLOVE) ×2 IMPLANT
GOWN STRL REIN XL XLG (GOWN DISPOSABLE) ×4 IMPLANT
INST SET HYSTEROSCOPY (KITS) ×2 IMPLANT
IV D5W 500ML (IV SOLUTION) ×2 IMPLANT
IV NS IRRIG 3000ML ARTHROMATIC (IV SOLUTION) ×2 IMPLANT
KIT ROOM TURNOVER APOR (KITS) ×2 IMPLANT
MANIFOLD NEPTUNE II (INSTRUMENTS) ×2 IMPLANT
MARKER SKIN DUAL TIP RULER LAB (MISCELLANEOUS) ×2 IMPLANT
NS IRRIG 1000ML POUR BTL (IV SOLUTION) ×2 IMPLANT
PACK BASIC III (CUSTOM PROCEDURE TRAY) ×2
PACK SRG BSC III STRL LF ECLPS (CUSTOM PROCEDURE TRAY) ×1 IMPLANT
PAD ARMBOARD 7.5X6 YLW CONV (MISCELLANEOUS) ×2 IMPLANT
PAD TELFA 3X4 1S STER (GAUZE/BANDAGES/DRESSINGS) ×2 IMPLANT
SET BASIN LINEN APH (SET/KITS/TRAYS/PACK) ×2 IMPLANT
SET BERKELEY SUCTION TUBING (SUCTIONS) ×1 IMPLANT
SET IRRIG Y TYPE TUR BLADDER L (SET/KITS/TRAYS/PACK) ×2 IMPLANT
SHEET LAVH (DRAPES) ×2 IMPLANT
VACURETTE 8MM (CANNULA) ×1 IMPLANT
YANKAUER SUCT BULB TIP 10FT TU (MISCELLANEOUS) ×2 IMPLANT

## 2011-06-18 NOTE — H&P (Signed)
Molly Berry is an 46 y.o. female with menometrorrahgia and dysmenorrhea that has worsened over the past year.  On sonogram it appears she has 5 or 6 endometrial polyps.  As a result she is brought in for diagnostic and operative hysteroscopy with removal of polyps.  i also recommended an endometrial ablation but patient is refusing to have the ablation done because she wants to p-reserve fertility options.  i once again voiced my recommendation that the best course of action would be the ablation but patient declines.  She understands she may require further surgery in the future as a result and she understands and declines the ablation.     Past Medical History  Diagnosis Date  . Depression   . Fibromyalgia   . Diabetes mellitus   . Thyroid disease   . PONV (postoperative nausea and vomiting)   . Hypothyroidism   . Asthma     Past Surgical History  Procedure Date  . Gastric bypass   . Cholecystectomy   . Dilation and curettage of uterus 12 yrs ago    Family History  Problem Relation Age of Onset  . Anesthesia problems Neg Hx   . Hypotension Neg Hx   . Malignant hyperthermia Neg Hx   . Pseudochol deficiency Neg Hx     Social History:  reports that she has never smoked. She does not have any smokeless tobacco history on file. She reports that she does not drink alcohol or use illicit drugs.  Allergies:  Allergies  Allergen Reactions  . Keflex Hives  . Penicillins Hives  . Bee Venom Swelling and Rash    Prescriptions prior to admission  Medication Sig Dispense Refill  . ALPRAZolam (XANAX) 1 MG tablet Take 1 mg by mouth 2 (two) times daily as needed. **Sometimes takes one every afternoon and one at bedtime for sleep**      . b complex vitamins capsule Take 1 capsule by mouth daily.        . Cholecalciferol (VITAMIN D PO) Take 1 capsule by mouth 2 (two) times daily.        Marland Kitchen docusate sodium (COLACE) 100 MG capsule Take 100 mg by mouth 2 (two) times daily.        .  DULoxetine (CYMBALTA) 60 MG capsule Take 60 mg by mouth every morning.       . fish oil-omega-3 fatty acids 1000 MG capsule Take 3 g by mouth 2 (two) times daily.      Marland Kitchen lamoTRIgine (LAMICTAL) 100 MG tablet Take 100 mg by mouth 2 (two) times daily.       . Multiple Vitamins-Minerals (MULTIVITAMIN WITH MINERALS) tablet Take 1 tablet by mouth every morning.       Marland Kitchen oxyCODONE (ROXICODONE) 15 MG immediate release tablet Take 15 mg by mouth 2 (two) times daily.       Marland Kitchen zinc gluconate 50 MG tablet Take 50 mg by mouth 2 (two) times daily.       Marland Kitchen albuterol (PROVENTIL HFA;VENTOLIN HFA) 108 (90 BASE) MCG/ACT inhaler Inhale 2 puffs into the lungs every 6 (six) hours as needed. For asthma         ROS  Review of Systems  Constitutional: Negative for fever, chills, weight loss, malaise/fatigue and diaphoresis.  HENT: Negative for hearing loss, ear pain, nosebleeds, congestion, sore throat, neck pain, tinnitus and ear discharge.   Eyes: Negative for blurred vision, double vision, photophobia, pain, discharge and redness.  Respiratory: Negative for cough, hemoptysis, sputum  production, shortness of breath, wheezing and stridor.   Cardiovascular: Negative for chest pain, palpitations, orthopnea, claudication, leg swelling and PND.  Gastrointestinal: Negative for abdominal pain. Negative for heartburn, nausea, vomiting, diarrhea, constipation, blood in stool and melena.  Genitourinary: Negative for dysuria, urgency, frequency, hematuria and flank pain.  Musculoskeletal: Negative for myalgias, back pain, joint pain and falls.  Skin: Negative for itching and rash.  Neurological: Negative for dizziness, tingling, tremors, sensory change, speech change, focal weakness, seizures, loss of consciousness, weakness and headaches.  Endo/Heme/Allergies: Negative for environmental allergies and polydipsia. Does not bruise/bleed easily.  Psychiatric/Behavioral: Negative for depression, suicidal ideas, hallucinations,  memory loss and substance abuse. The patient is not nervous/anxious and does not have insomnia.      Blood pressure 156/96, pulse 65, temperature 98.3 F (36.8 C), temperature source Oral, resp. rate 17, SpO2 99.00%. Physical Exam Physical Exam  Vitals reviewed. Constitutional: She is oriented to person, place, and time. She appears well-developed and well-nourished.  HENT:  Head: Normocephalic and atraumatic.  Right Ear: External ear normal.  Left Ear: External ear normal.  Nose: Nose normal.  Mouth/Throat: Oropharynx is clear and moist.  Eyes: Conjunctivae and EOM are normal. Pupils are equal, round, and reactive to light. Right eye exhibits no discharge. Left eye exhibits no discharge. No scleral icterus.  Neck: Normal range of motion. Neck supple. No tracheal deviation present. No thyromegaly present.  Cardiovascular: Normal rate, regular rhythm, normal heart sounds and intact distal pulses.  Exam reveals no gallop and no friction rub.   No murmur heard. Respiratory: Effort normal and breath sounds normal. No respiratory distress. She has no wheezes. She has no rales. She exhibits no tenderness.  GI: Soft. Bowel sounds are normal. She exhibits no distension and no mass. There is tenderness. There is no rebound and no guarding.  Genitourinary:       Vulva is normal without lesions Vagina is pink moist without discharge Cervix normal in appearance and pap is normal Uterus is normal sized with endometrial polyps multiple by sonogram Adnexa normal by sonogram al range of motion. She exhibits no edema and no tenderness.  Neurological: She is alert and oriented to person, place, and time. She has normal reflexes. She displays normal reflexes. No cranial nerve deficit. She exhibits normal muscle tone. Coordination normal.  Skin: Skin is warm and dry. No rash noted. No erythema. No pallor.  Psychiatric: She has a normal mood and affect. Her behavior is normal. Judgment and thought content  normal.   Recent Results (from the past 336 hour(s))  SURGICAL PCR SCREEN   Collection Time   06/13/11 12:59 PM      Component Value Range   MRSA, PCR NEGATIVE  NEGATIVE    Staphylococcus aureus NEGATIVE  NEGATIVE   URINALYSIS, ROUTINE W REFLEX MICROSCOPIC   Collection Time   06/13/11 12:59 PM      Component Value Range   Color, Urine YELLOW  YELLOW    APPearance CLEAR  CLEAR    Specific Gravity, Urine 1.020  1.005 - 1.030    pH 5.5  5.0 - 8.0    Glucose, UA NEGATIVE  NEGATIVE (mg/dL)   Hgb urine dipstick NEGATIVE  NEGATIVE    Bilirubin Urine NEGATIVE  NEGATIVE    Ketones, ur NEGATIVE  NEGATIVE (mg/dL)   Protein, ur NEGATIVE  NEGATIVE (mg/dL)   Urobilinogen, UA 0.2  0.0 - 1.0 (mg/dL)   Nitrite POSITIVE (*) NEGATIVE    Leukocytes, UA TRACE (*) NEGATIVE  URINE MICROSCOPIC-ADD ON   Collection Time   06/13/11 12:59 PM      Component Value Range   Squamous Epithelial / LPF MANY (*) RARE    WBC, UA 7-10  <3 (WBC/hpf)   Bacteria, UA MANY (*) RARE   CBC   Collection Time   06/13/11  1:30 PM      Component Value Range   WBC 5.1  4.0 - 10.5 (K/uL)   RBC 4.15  3.87 - 5.11 (MIL/uL)   Hemoglobin 12.2  12.0 - 15.0 (g/dL)   HCT 95.6  21.3 - 08.6 (%)   MCV 89.2  78.0 - 100.0 (fL)   MCH 29.4  26.0 - 34.0 (pg)   MCHC 33.0  30.0 - 36.0 (g/dL)   RDW 57.8  46.9 - 62.9 (%)   Platelets 311  150 - 400 (K/uL)  COMPREHENSIVE METABOLIC PANEL   Collection Time   06/13/11  1:30 PM      Component Value Range   Sodium 138  135 - 145 (mEq/L)   Potassium 4.4  3.5 - 5.1 (mEq/L)   Chloride 102  96 - 112 (mEq/L)   CO2 28  19 - 32 (mEq/L)   Glucose, Bld 88  70 - 99 (mg/dL)   BUN 11  6 - 23 (mg/dL)   Creatinine, Ser 5.28  0.50 - 1.10 (mg/dL)   Calcium 9.2  8.4 - 41.3 (mg/dL)   Total Protein 6.0  6.0 - 8.3 (g/dL)   Albumin 3.3 (*) 3.5 - 5.2 (g/dL)   AST 20  0 - 37 (U/L)   ALT 16  0 - 35 (U/L)   Alkaline Phosphatase 94  39 - 117 (U/L)   Total Bilirubin 0.4  0.3 - 1.2 (mg/dL)   GFR calc non Af Amer >90   >90 (mL/min)   GFR calc Af Amer >90  >90 (mL/min)  HCG, QUANTITATIVE, PREGNANCY   Collection Time   06/13/11  1:30 PM      Component Value Range   hCG, Beta Chain, Quant, S <1  <5 (mIU/mL)  GLUCOSE, CAPILLARY   Collection Time   06/18/11  9:17 AM      Component Value Range   Glucose-Capillary 87  70 - 99 (mg/dL)      Results for orders placed during the hospital encounter of 06/18/11 (from the past 24 hour(s))  GLUCOSE, CAPILLARY     Status: Normal   Collection Time   06/18/11  9:17 AM      Component Value Range   Glucose-Capillary 87  70 - 99 (mg/dL)      Assessment/Plan: 1.  Endometrial polyps causing menometrorrhagia and dysmenorrhea  Patient to proceed with hysteroscopy D&C.  She declines endometrial ablation. Pascual Mantel H 06/18/2011, 9:43 AM

## 2011-06-18 NOTE — Discharge Instructions (Signed)
Hysteroscopy Hysteroscopy is a procedure used for looking inside the womb (uterus). It may be done for many different reasons, including:  To evaluate abnormal bleeding, fibroid (benign, noncancerous) tumors, polyps, scar tissue (adhesions), and possibly cancer of the uterus.   To look for lumps (tumors) and other uterine growths.   To look for causes of why a woman cannot get pregnant (infertility), causes of recurrent loss of pregnancy (miscarriages), or a lost intrauterine device (IUD).   To perform a sterilization by blocking the fallopian tubes from inside the uterus.  A hysteroscopy should be done right after a menstrual period to be sure you are not pregnant. LET YOUR CAREGIVER KNOW ABOUT:   Allergies.   Medicines taken, including herbs, eyedrops, over-the-counter medicines, and creams.   Use of steroids (by mouth or creams).   Previous problems with anesthetics or numbing medicines.   History of bleeding or blood problems.   History of blood clots.   Possibility of pregnancy, if this applies.   Previous surgery.   Other health problems.  RISKS AND COMPLICATIONS   Putting a hole in the uterus.   Excessive bleeding.   Infection.   Damage to the cervix.   Injury to other organs.   Allergic reaction to medicines.   Too much fluid used in the uterus for the procedure.  BEFORE THE PROCEDURE   Do not take aspirin or blood thinners for a week before the procedure, or as directed. It can cause bleeding.   Arrive at least 60 minutes before the procedure or as directed to read and sign the necessary forms.   Arrange for someone to take you home after the procedure.   If you smoke, do not smoke for 2 weeks before the procedure.  PROCEDURE   Your caregiver may give you medicine to relax you. He or she may also give you a medicine that numbs the area around the cervix (local anesthetic) or a medicine that makes you sleep (general anesthesia).   Sometimes, a  medicine is placed in the cervix the day before the procedure. This medicine makes the cervix have a larger opening (dilate). This makes it easier for the instrument to be inserted into the uterus.   A small instrument (hysteroscope) is inserted through the vagina into the uterus. This instrument is similar to a pencil-sized telescope with a light.   During the procedure, air or a liquid is put into the uterus, which allows the surgeon to see better.   Sometimes, tissue is gently scraped from inside the uterus. These tissue samples are sent to a specialist who looks at tissue samples (pathologist). The pathologist will give a report to your caregiver. This will help your caregiver decide if further treatment is necessary. The report will also help your caregiver decide on the best treatment if the test comes back abnormal.  AFTER THE PROCEDURE   If you had a general anesthetic, you may be groggy for a couple hours after the procedure.   If you had a local anesthetic, you will be advised to rest at the surgical center or caregiver's office until you are stable and feel ready to go home.   You may have some cramping for a couple days.   You may have bleeding, which varies from light spotting for a few days to menstrual-like bleeding for up to 3 to 7 days. This is normal.   Have someone take you home.  FINDING OUT THE RESULTS OF YOUR TEST Not all test results   are available during your visit. If your test results are not back during the visit, make an appointment with your caregiver to find out the results. Do not assume everything is normal if you have not heard from your caregiver or the medical facility. It is important for you to follow up on all of your test results. HOME CARE INSTRUCTIONS   Do not drive for 24 hours or as instructed.   Only take over-the-counter or prescription medicines for pain, discomfort, or fever as directed by your caregiver.   Do not take aspirin. It can cause or  aggravate bleeding.   Do not drive or drink alcohol while taking pain medicine.   You may resume your usual diet.   Do not use tampons, douche, or have sexual intercourse for 2 weeks, or as advised by your caregiver.   Rest and sleep for the first 24 to 48 hours.   Take your temperature twice a day for 4 to 5 days. Write it down. Give these temperatures to your caregiver if they are abnormal (above 98.6 F or 37.0 C).   Take medicines your caregiver has ordered as directed.   Follow your caregiver's advice regarding diet, exercise, lifting, driving, and general activities.   Take showers instead of baths for 2 weeks, or as recommended by your caregiver.   If you develop constipation:   Take a mild laxative with the advice of your caregiver.   Eat bran foods.   Drink enough water and fluids to keep your urine clear or pale yellow.   Try to have someone with you or available to you for the first 24 to 48 hours, especially if you had a general anesthetic.   Make sure you and your family understand everything about your operation and recovery.   Follow your caregiver's advice regarding follow-up appointments and Pap smears.  SEEK MEDICAL CARE IF:   You feel dizzy or lightheaded.   You feel sick to your stomach (nauseous).   You develop abnormal vaginal discharge.   You develop a rash.   You have an abnormal reaction or allergy to your medicine.   You need stronger pain medicine.  SEEK IMMEDIATE MEDICAL CARE IF:   Bleeding is heavier than a normal menstrual period or you have blood clots.   You have an oral temperature above 102 F (38.9 C), not controlled by medicine.   You have increasing cramps or pains not relieved with medicine.   You develop belly (abdominal) pain that does not seem to be related to the same area of earlier cramping and pain.   You pass out.   You develop pain in the tops of your shoulders (shoulder strap areas).   You develop shortness of  breath.  MAKE SURE YOU:   Understand these instructions.   Will watch your condition.   Will get help right away if you are not doing well or get worse.  Document Released: 07/28/2000 Document Revised: 01/01/2011 Document Reviewed: 11/20/2008 [facility] Patient Information 2012 [address], [place].

## 2011-06-18 NOTE — Anesthesia Procedure Notes (Signed)
Procedure Name: LMA Insertion Date/Time: 06/18/2011 10:28 AM Performed by: Minerva Areola Pre-anesthesia Checklist: Patient identified, Patient being monitored, Emergency Drugs available, Timeout performed and Suction available Patient Re-evaluated:Patient Re-evaluated prior to inductionOxygen Delivery Method: Circle System Utilized Preoxygenation: Pre-oxygenation with 100% oxygen Intubation Type: IV induction Ventilation: Mask ventilation without difficulty LMA: LMA inserted LMA Size: 4.0 Number of attempts: 1 Placement Confirmation: positive ETCO2 and breath sounds checked- equal and bilateral

## 2011-06-18 NOTE — Anesthesia Postprocedure Evaluation (Signed)
Anesthesia Post Note  Patient: Molly Berry  Procedure(s) Performed: Procedure(s) (LRB): DILATATION AND CURETTAGE /HYSTEROSCOPY (N/A) DILATATION AND CURETTAGE (N/A)  Anesthesia type: General  Patient location: PACU  Post pain: Pain level controlled  Post assessment: Post-op Vital signs reviewed, Patient's Cardiovascular Status Stable, Respiratory Function Stable, Patent Airway, No signs of Nausea or vomiting and Pain level controlled  Last Vitals:  Filed Vitals:   06/18/11 1124  BP: 156/90  Pulse: 66  Temp: 36.9 C  Resp: 12    Post vital signs: Reviewed and stable  Level of consciousness: awake and alert   Complications: No apparent anesthesia complications

## 2011-06-18 NOTE — Op Note (Signed)
Preoperative diagnosis:  Endometrial polyps, multiple                                         Menometrorrhagia                                         Dysmenorrhea   Postoperative diagnoses: Same as above   Procedure: Hysteroscopy, uterine curettage  Surgeon: Despina Hidden MD  Anesthesia: Laryngeal mask airway  Findings: Sonogram from the office reveals multiple, 5-6, endometrial polyps, mostly small.   There were no fibroid or other abnormalities.  Description of operation: The patient was taken to the operating room and placed in the supine position. She underwent general anesthesia using the laryngeal mask airway. She was placed in the dorsal lithotomy position and prepped and draped in the usual sterile fashion. A Graves speculum was placed and the anterior cervical lip was grasped with a single-tooth tenaculum. However, because of the patients depth, I had to use a long weighted speculum and deaver retractors in order to perform the hysteroscopy and uterine curettage. The cervix was dilated serially to allow passage of the hysteroscope. Diagnostic hysteroscopy was performed and was found to reflect the sonographic findings with multiple small endometrial polyps.  A vigorous uterine curettage was then performed and all tissue sent to pathology for evaluation. I also used suction curettage to completely clean out the endometrial cavity. There was good hemostasis and all polyps were removed. The patient was awakened from anesthesia and taken to the recovery room in good stable condition all counts were correct. She received 900 of Cleocin and 400 of Cipro f and 30 mg of Toradol preoperatively. She will be discharged from the recovery room and followed up in the office next week.  EBL was 25 cc.  Landynn Dupler H 11:18 AM 06/18/2011

## 2011-06-18 NOTE — Transfer of Care (Signed)
Immediate Anesthesia Transfer of Care Note  Patient: Molly Berry  Procedure(s) Performed: Procedure(s) (LRB): DILATATION AND CURETTAGE /HYSTEROSCOPY (N/A) DILATATION AND CURETTAGE (N/A)  Patient Location: PACU  Anesthesia Type: General  Level of Consciousness: awake  Airway & Oxygen Therapy: Patient Spontanous Breathing and non-rebreather face mask  Post-op Assessment: Report given to PACU RN, Post -op Vital signs reviewed and stable and Patient moving all extremities  Post vital signs: Reviewed and stable  Complications: No apparent anesthesia complications

## 2011-06-18 NOTE — Anesthesia Preprocedure Evaluation (Signed)
Anesthesia Evaluation  Patient identified by MRN, date of birth, ID band Patient awake    History of Anesthesia Complications (+) PONV  Airway Mallampati: III      Dental  (+) Teeth Intact   Pulmonary asthma ,  clear to auscultation        Cardiovascular neg cardio ROS Regular Normal    Neuro/Psych    GI/Hepatic   Endo/Other  Diabetes mellitus- (has resolved after weight loss), Well Controlled, Type 2Hypothyroidism   Renal/GU      Musculoskeletal  (+) Fibromyalgia -  Abdominal   Peds  Hematology   Anesthesia Other Findings   Reproductive/Obstetrics                           Anesthesia Physical Anesthesia Plan  ASA: II  Anesthesia Plan: General   Post-op Pain Management:    Induction: Intravenous  Airway Management Planned: LMA  Additional Equipment:   Intra-op Plan:   Post-operative Plan: Extubation in OR  Informed Consent: I have reviewed the patients History and Physical, chart, labs and discussed the procedure including the risks, benefits and alternatives for the proposed anesthesia with the patient or authorized representative who has indicated his/her understanding and acceptance.     Plan Discussed with:   Anesthesia Plan Comments:         Anesthesia Quick Evaluation

## 2011-06-19 ENCOUNTER — Encounter (HOSPITAL_COMMUNITY): Payer: Self-pay | Admitting: Obstetrics & Gynecology

## 2011-06-25 ENCOUNTER — Other Ambulatory Visit (HOSPITAL_COMMUNITY): Payer: Self-pay | Admitting: "Endocrinology

## 2011-06-25 DIAGNOSIS — E049 Nontoxic goiter, unspecified: Secondary | ICD-10-CM

## 2011-06-27 ENCOUNTER — Ambulatory Visit (HOSPITAL_COMMUNITY)
Admission: RE | Admit: 2011-06-27 | Discharge: 2011-06-27 | Disposition: A | Discharge status: Home / Self Care | Payer: BC Managed Care – PPO | Source: Ambulatory Visit | Attending: "Endocrinology | Admitting: "Endocrinology

## 2011-06-27 DIAGNOSIS — E049 Nontoxic goiter, unspecified: Secondary | ICD-10-CM

## 2011-07-09 ENCOUNTER — Other Ambulatory Visit (HOSPITAL_COMMUNITY): Payer: Self-pay | Admitting: "Endocrinology

## 2011-07-09 DIAGNOSIS — E049 Nontoxic goiter, unspecified: Secondary | ICD-10-CM

## 2011-08-29 ENCOUNTER — Emergency Department (HOSPITAL_COMMUNITY): Payer: BC Managed Care – PPO

## 2011-08-29 ENCOUNTER — Emergency Department (HOSPITAL_COMMUNITY)
Admission: EM | Admit: 2011-08-29 | Discharge: 2011-08-29 | Disposition: A | Discharge status: Home / Self Care | Payer: BC Managed Care – PPO | Attending: Emergency Medicine | Admitting: Emergency Medicine

## 2011-08-29 ENCOUNTER — Encounter (HOSPITAL_COMMUNITY): Payer: Self-pay | Admitting: *Deleted

## 2011-08-29 DIAGNOSIS — J45909 Unspecified asthma, uncomplicated: Secondary | ICD-10-CM | POA: Insufficient documentation

## 2011-08-29 DIAGNOSIS — IMO0001 Reserved for inherently not codable concepts without codable children: Secondary | ICD-10-CM | POA: Insufficient documentation

## 2011-08-29 DIAGNOSIS — N200 Calculus of kidney: Secondary | ICD-10-CM

## 2011-08-29 DIAGNOSIS — E119 Type 2 diabetes mellitus without complications: Secondary | ICD-10-CM | POA: Insufficient documentation

## 2011-08-29 DIAGNOSIS — Z79899 Other long term (current) drug therapy: Secondary | ICD-10-CM | POA: Insufficient documentation

## 2011-08-29 DIAGNOSIS — E039 Hypothyroidism, unspecified: Secondary | ICD-10-CM | POA: Insufficient documentation

## 2011-08-29 DIAGNOSIS — R109 Unspecified abdominal pain: Secondary | ICD-10-CM | POA: Insufficient documentation

## 2011-08-29 HISTORY — DX: Nontoxic multinodular goiter: E04.2

## 2011-08-29 HISTORY — DX: Malignant neoplasm of uterus, part unspecified: C55

## 2011-08-29 LAB — BASIC METABOLIC PANEL
Calcium: 9.1 mg/dL (ref 8.4–10.5)
GFR calc Af Amer: >90 mL/min (ref >90)
GFR calc non Af Amer: >90 mL/min (ref >90)
Glucose, Bld: 128 mg/dL — ABNORMAL HIGH (ref 70–99)
Sodium: 139 mEq/L (ref 135–145)

## 2011-08-29 LAB — CBC
MCH: 29.1 pg (ref 26.0–34.0)
MCHC: 33.6 g/dL (ref 30.0–36.0)
MCV: 86.6 fL (ref 78.0–100.0)
Platelets: 306 10*3/uL (ref 150–400)
RDW: 12.9 % (ref 11.5–15.5)

## 2011-08-29 LAB — URINALYSIS, ROUTINE W REFLEX MICROSCOPIC
Nitrite: NEGATIVE
Protein, ur: NEGATIVE mg/dL
Urobilinogen, UA: 0.2 mg/dL (ref 0.0–1.0)

## 2011-08-29 LAB — URINE MICROSCOPIC-ADD ON

## 2011-08-29 LAB — DIFFERENTIAL
Basophils Absolute: 0 10*3/uL (ref 0.0–0.1)
Basophils Relative: 1 % (ref 0–1)
Eosinophils Absolute: 0.2 10*3/uL (ref 0.0–0.7)
Eosinophils Relative: 4 % (ref 0–5)

## 2011-08-29 MED ORDER — HYDROMORPHONE HCL PF 1 MG/ML IJ SOLN
1.0000 mg | Freq: Once | INTRAMUSCULAR | Status: AC
  Administered 2011-08-29: 1 mg via INTRAVENOUS

## 2011-08-29 MED ORDER — ONDANSETRON HCL 4 MG/2ML IJ SOLN
INTRAMUSCULAR | Status: AC
  Administered 2011-08-29: 4 mg via INTRAVENOUS
  Filled 2011-08-29: qty 2

## 2011-08-29 MED ORDER — HYDROMORPHONE HCL PF 1 MG/ML IJ SOLN
INTRAMUSCULAR | Status: AC
  Administered 2011-08-29: 1 mg via INTRAVENOUS
  Filled 2011-08-29: qty 1

## 2011-08-29 MED ORDER — HYDROMORPHONE HCL PF 1 MG/ML IJ SOLN
INTRAMUSCULAR | Status: AC
  Filled 2011-08-29: qty 1

## 2011-08-29 MED ORDER — IBUPROFEN 800 MG PO TABS: 800.0000 mg | ORAL_TABLET | Freq: Three times a day (TID) | ORAL | Status: AC | PRN

## 2011-08-29 MED ORDER — KETOROLAC TROMETHAMINE 30 MG/ML IJ SOLN
INTRAMUSCULAR | Status: AC
  Administered 2011-08-29: 30 mg via INTRAVENOUS
  Filled 2011-08-29: qty 1

## 2011-08-29 MED ORDER — ONDANSETRON HCL 8 MG PO TABS: 8.0000 mg | ORAL_TABLET | Freq: Three times a day (TID) | ORAL | Status: AC | PRN

## 2011-08-29 MED ORDER — ONDANSETRON HCL 4 MG/2ML IJ SOLN
4.0000 mg | Freq: Once | INTRAMUSCULAR | Status: AC
  Administered 2011-08-29: 4 mg via INTRAVENOUS

## 2011-08-29 MED ORDER — SODIUM CHLORIDE 0.9 % IV SOLN
Freq: Once | INTRAVENOUS | Status: AC
  Administered 2011-08-29: 1000 mL via INTRAVENOUS

## 2011-08-29 MED ORDER — HYDROMORPHONE HCL 2 MG PO TABS: 2.0000 mg | ORAL_TABLET | ORAL | Status: AC | PRN

## 2011-08-29 MED ORDER — KETOROLAC TROMETHAMINE 30 MG/ML IJ SOLN
30.0000 mg | Freq: Once | INTRAMUSCULAR | Status: AC
  Administered 2011-08-29: 30 mg via INTRAVENOUS

## 2011-08-29 NOTE — ED Notes (Addendum)
Pt presents to er with sudden onset of left flank pain that radiates to left lower abd pain, admits to nausea, denies any vomiting, pt tearful in tx room skin clammy,  pt states that she has finished antibiotics two days ago for uti.

## 2011-08-29 NOTE — Discharge Instructions (Signed)

## 2011-08-29 NOTE — ED Provider Notes (Signed)
History    This chart was scribed for Molly Bonier, MD, MD by Smitty Pluck. The patient was seen in room APA14 and the patient's care was started at 10:33AM.   CSN: [id number]  Arrival date & time 08/29/11  0915   First MD Initiated Contact with Patient 08/29/11 [phone]      Chief Complaint  Patient presents with  . Flank Pain    (Consider location/radiation/quality/duration/timing/severity/associated sxs/prior treatment) Patient is a 46 y.o. female presenting with flank pain. The history is provided by the patient.  Flank Pain   CYRILLA DURKIN is a 46 y.o. female who presents to the Emergency Department complaining of severe left flank pain radiating to lower left abdomen. Sudden onset of symptoms. Symptoms have been constant since onset. Pt rated the pain 10/10 upon arrival but now is 0-1/10 in intensity. Pt reports the pain as sharp. She states that she could not urinate when the pain started. She states she has hx of kidney stones and that her current symptoms are similar to prior renal colic. Pt denies having fever and chills but reports diaphoresis with the pain onset and nausea. Pt was diagnosed with stage 1 uterine cancer (Dr. Lily Peer at [facility]) recently.  She is scheduled for hysterectomy May 15. She reports going to PCP and dx with UTI. She was given abx for treatment with relief and she finished 2 days ago. Denies hematuria. On arrival the patient was given narcotic/NSAID, antiemetic IV with near complete resolution of pain.  Documented examination reflects findings after pain control achieved. PCP is Dr. Regino Schultze Past Medical History  Diagnosis Date  . Depression   . Fibromyalgia   . Diabetes mellitus   . PONV (postoperative nausea and vomiting)   . Asthma   . Cancer   . Uterine cancer   . Thyroid disease   . Hypothyroidism   . Multiple thyroid nodules   . Kidney stones     Past Surgical History  Procedure Date  . Gastric bypass   . Cholecystectomy   .  Dilation and curettage of uterus 12 yrs ago  . Hysteroscopy w/d&c 06/18/2011    Procedure: DILATATION AND CURETTAGE /HYSTEROSCOPY;  Surgeon: Lazaro Arms, MD;  Location: [facility];  Service: Gynecology;  Laterality: N/A;  . Dilation and curettage of uterus 06/18/2011    Procedure: DILATATION AND CURETTAGE;  Surgeon: Lazaro Arms, MD;  Location: [facility];  Service: Gynecology;  Laterality: N/A;  Suction Dilation and Curettage    Family History  Problem Relation Age of Onset  . Anesthesia problems Neg Hx   . Hypotension Neg Hx   . Malignant hyperthermia Neg Hx   . Pseudochol deficiency Neg Hx     History  Substance Use Topics  . Smoking status: Never Smoker   . Smokeless tobacco: Not on file  . Alcohol Use: No    OB History    Grav Para Term Preterm Abortions TAB SAB Ect Mult Living                  Review of Systems  Genitourinary: Positive for flank pain.  All other systems reviewed and are negative.   10 Systems reviewed and all are negative for acute change except as noted in the HPI.   Allergies  Keflex; Penicillins; and Bee venom  Home Medications   Current Outpatient Rx  Name Route Sig Dispense Refill  . ALBUTEROL SULFATE HFA 108 (90 BASE) MCG/ACT IN AERS Inhalation Inhale 2 puffs into the  lungs every 6 (six) hours as needed. For asthma     . ALPRAZOLAM 1 MG PO TABS Oral Take 1 mg by mouth 2 (two) times daily as needed. **Sometimes takes one every afternoon and one at bedtime for sleep**    . B COMPLEX VITAMINS PO CAPS Oral Take 1 capsule by mouth daily.      Marland Kitchen VITAMIN D PO Oral Take 1 capsule by mouth 2 (two) times daily.      Marland Kitchen DOCUSATE SODIUM 100 MG PO CAPS Oral Take 100 mg by mouth 2 (two) times daily.      . DULOXETINE HCL 60 MG PO CPEP Oral Take 60 mg by mouth every morning.     Marland Kitchen OMEGA-3 FATTY ACIDS 1000 MG PO CAPS Oral Take 3 g by mouth 2 (two) times daily.    Marland Kitchen LAMOTRIGINE 100 MG PO TABS Oral Take 100 mg by mouth 2 (two) times daily.     .  MULTI-VITAMIN/MINERALS PO TABS Oral Take 1 tablet by mouth every morning.     . OXYCODONE HCL 15 MG PO TABS Oral Take 15 mg by mouth 2 (two) times daily.     Marland Kitchen ZINC GLUCONATE 50 MG PO TABS Oral Take 50 mg by mouth 2 (two) times daily.       BP 178/87  Pulse 61  Temp 97.8 F (36.6 C)  Resp 24  Ht 5\' 4"  (1.626 m)  Wt 210 lb (95.255 kg)  BMI 36.05 kg/m2  SpO2 100%  Physical Exam  Nursing note and vitals reviewed. Constitutional: She is oriented to person, place, and time. She appears well-developed and well-nourished. No distress.       No significant stress  HENT:  Head: Normocephalic and atraumatic.  Eyes: EOM are normal. Pupils are equal, round, and reactive to light.  Neck: Normal range of motion. Neck supple. No JVD present. No thyromegaly present.  Cardiovascular: Normal rate, regular rhythm, normal heart sounds and intact distal pulses.  Exam reveals no gallop and no friction rub.   No murmur heard. Pulmonary/Chest: Effort normal and breath sounds normal. No respiratory distress. She has no wheezes. She has no rales. She exhibits no tenderness.  Abdominal: Soft. Bowel sounds are normal. She exhibits no distension, no fluid wave, no ascites and no mass. There is no hepatosplenomegaly. There is tenderness (mild LLQ tender radiating from left flank) in the left lower quadrant. There is no rebound, no guarding and no CVA tenderness.  Musculoskeletal: Normal range of motion. She exhibits no edema and no tenderness.  Neurological: She is alert and oriented to person, place, and time. She has normal reflexes. No cranial nerve deficit. She exhibits normal muscle tone. Coordination normal.  Skin: Skin is warm and dry. No rash noted. She is not diaphoretic. No erythema. No pallor.  Psychiatric: She has a normal mood and affect. Her behavior is normal. Judgment and thought content normal.    ED Course  Procedures (including critical care time) DIAGNOSTIC STUDIES: Oxygen Saturation is  100% on room air, normal by my interpretation.    COORDINATION OF CARE: 9:45AM EDP orders medication: zofran 4 mg, dilaudid 1 mg, toradol 30 mg, 0.9% NaCl infusion  10:45AM EDP discusses pt ED treatment with pt.    Labs Reviewed  BASIC METABOLIC PANEL - Abnormal; Notable for the following:    Glucose, Bld 128 (*)    All other components within normal limits  URINALYSIS, ROUTINE W REFLEX MICROSCOPIC - Abnormal; Notable for the following:  Hgb urine dipstick LARGE (*)    Leukocytes, UA MODERATE (*)    All other components within normal limits  CBC  DIFFERENTIAL  URINE MICROSCOPIC-ADD ON  URINE CULTURE   Ct Abdomen Pelvis Wo Contrast  08/29/2011  *RADIOLOGY REPORT*  Clinical Data: Left flank pain.  Similar to previous urinary colic.  CT ABDOMEN AND PELVIS WITHOUT CONTRAST  Technique:  Multidetector CT imaging of the abdomen and pelvis was performed following the standard protocol without intravenous contrast.  Comparison: 02/07/2011  Findings: No focal abnormalities seen in the liver or spleen on this study performed without intravenous contrast material. Postsurgical change in the stomach is compatible with reported history of previous gastric bypass procedure.  Duodenum and pancreas are unremarkable.  Gallbladder is surgically absent.  No adrenal mass.  Adjacent 1 mm nonobstructing stones are seen in the upper pole of the right kidney which is otherwise unremarkable.  5 mm nonobstructing stone is seen in the upper pole of the left kidney.  2 mm nonobstructing upper pole left renal stone is associated with a 1 mm nonobstructing stone in the interpolar left kidney.  There is some mild fullness of the left intrarenal collecting system.  3 x 3 x 3 mm stone is identified at the left ureterovesical junction.  No evidence for right ureteral stone.  No evidence for bladder stone.  No abdominal aortic aneurysm.  No lymphadenopathy or free fluid in the abdomen.  Imaging through the pelvis shows no free  intraperitoneal fluid.  No pelvic sidewall lymphadenopathy.  Uterus is unremarkable.  There is no adnexal mass.  Terminal ileum and appendix are normal.  Bone windows reveal no worrisome lytic or sclerotic osseous lesions.  IMPRESSION: 3 x 3 x 3 mm stone at the left ureteropelvic junction causes mild secondary changes in the left kidney.  Other nonobstructing renal stones bilaterally.  Original Report Authenticated By: ERIC A. MANSELL, M.D.   Images and results reviewed by me with the patient.  Based on size of the kidney stone it should pass without intervention given symptomatic treatment in the interim for comfort.  Urology referral should symptoms persist.  Pt./spouse states understanding/agreement with plan.  No diagnosis found.    MDM  Renal colic from 3 mm left ureteral calculus.  Symptoms controlled.  No UTI.  Preserved renal function.  The patient will be treated symptomatically after discharge from the ED.   I personally performed the services described in this documentation, which was scribed in my presence. The recorded information has been reviewed and considered.         Molly Bonier, MD 08/30/11 [phone]

## 2011-08-31 LAB — URINE CULTURE

## 2011-09-17 DIAGNOSIS — N200 Calculus of kidney: Secondary | ICD-10-CM | POA: Insufficient documentation

## 2011-09-23 ENCOUNTER — Ambulatory Visit (HOSPITAL_COMMUNITY)
Admission: RE | Admit: 2011-09-23 | Discharge: 2011-09-23 | Disposition: A | Discharge status: Home / Self Care | Payer: BC Managed Care – PPO | Source: Ambulatory Visit | Attending: Internal Medicine | Admitting: Internal Medicine

## 2011-09-23 ENCOUNTER — Other Ambulatory Visit (HOSPITAL_COMMUNITY): Payer: Self-pay | Admitting: Internal Medicine

## 2011-09-23 DIAGNOSIS — R05 Cough: Secondary | ICD-10-CM | POA: Insufficient documentation

## 2011-09-23 DIAGNOSIS — R059 Cough, unspecified: Secondary | ICD-10-CM | POA: Insufficient documentation

## 2011-09-23 DIAGNOSIS — J069 Acute upper respiratory infection, unspecified: Secondary | ICD-10-CM

## 2011-10-01 ENCOUNTER — Other Ambulatory Visit: Payer: Self-pay | Admitting: Urology

## 2011-10-01 DIAGNOSIS — N200 Calculus of kidney: Secondary | ICD-10-CM

## 2011-10-09 ENCOUNTER — Ambulatory Visit (HOSPITAL_COMMUNITY): Payer: BC Managed Care – PPO

## 2011-11-05 DIAGNOSIS — J039 Acute tonsillitis, unspecified: Secondary | ICD-10-CM

## 2011-12-01 ENCOUNTER — Ambulatory Visit (HOSPITAL_COMMUNITY): Payer: BC Managed Care – PPO

## 2011-12-05 ENCOUNTER — Ambulatory Visit: Payer: BC Managed Care – PPO | Admitting: Urology

## 2011-12-26 DIAGNOSIS — C55 Malignant neoplasm of uterus, part unspecified: Secondary | ICD-10-CM

## 2011-12-26 HISTORY — DX: Malignant neoplasm of uterus, part unspecified: C55

## 2012-08-18 ENCOUNTER — Emergency Department (HOSPITAL_COMMUNITY)
Admission: EM | Admit: 2012-08-18 | Discharge: 2012-08-19 | Disposition: A | Discharge status: Home / Self Care | Payer: BC Managed Care – PPO | Attending: Emergency Medicine | Admitting: Emergency Medicine

## 2012-08-18 ENCOUNTER — Encounter (HOSPITAL_COMMUNITY): Payer: Self-pay | Admitting: Emergency Medicine

## 2012-08-18 DIAGNOSIS — Z79899 Other long term (current) drug therapy: Secondary | ICD-10-CM | POA: Insufficient documentation

## 2012-08-18 DIAGNOSIS — Z87442 Personal history of urinary calculi: Secondary | ICD-10-CM | POA: Insufficient documentation

## 2012-08-18 DIAGNOSIS — F339 Major depressive disorder, recurrent, unspecified: Secondary | ICD-10-CM | POA: Insufficient documentation

## 2012-08-18 DIAGNOSIS — F3289 Other specified depressive episodes: Secondary | ICD-10-CM | POA: Insufficient documentation

## 2012-08-18 DIAGNOSIS — Z862 Personal history of diseases of the blood and blood-forming organs and certain disorders involving the immune mechanism: Secondary | ICD-10-CM | POA: Insufficient documentation

## 2012-08-18 DIAGNOSIS — N390 Urinary tract infection, site not specified: Secondary | ICD-10-CM | POA: Insufficient documentation

## 2012-08-18 DIAGNOSIS — Z8739 Personal history of other diseases of the musculoskeletal system and connective tissue: Secondary | ICD-10-CM | POA: Insufficient documentation

## 2012-08-18 DIAGNOSIS — E119 Type 2 diabetes mellitus without complications: Secondary | ICD-10-CM | POA: Insufficient documentation

## 2012-08-18 DIAGNOSIS — Z8639 Personal history of other endocrine, nutritional and metabolic disease: Secondary | ICD-10-CM | POA: Insufficient documentation

## 2012-08-18 DIAGNOSIS — J45909 Unspecified asthma, uncomplicated: Secondary | ICD-10-CM | POA: Insufficient documentation

## 2012-08-18 DIAGNOSIS — F329 Major depressive disorder, single episode, unspecified: Secondary | ICD-10-CM | POA: Insufficient documentation

## 2012-08-18 DIAGNOSIS — Z8542 Personal history of malignant neoplasm of other parts of uterus: Secondary | ICD-10-CM | POA: Insufficient documentation

## 2012-08-18 DIAGNOSIS — E039 Hypothyroidism, unspecified: Secondary | ICD-10-CM | POA: Insufficient documentation
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
## 2012-08-19 LAB — URINALYSIS, ROUTINE W REFLEX MICROSCOPIC
Bilirubin Urine: NEGATIVE
Glucose, UA: NEGATIVE mg/dL
Ketones, ur: NEGATIVE mg/dL
Nitrite: NEGATIVE
Protein, ur: NEGATIVE mg/dL

## 2012-08-19 LAB — RAPID URINE DRUG SCREEN, HOSP PERFORMED
Amphetamines: NOT DETECTED
Barbiturates: NOT DETECTED
Benzodiazepines: POSITIVE — AB
Cocaine: NOT DETECTED

## 2012-08-19 LAB — URINE MICROSCOPIC-ADD ON

## 2012-08-19 MED ORDER — OXYCODONE-ACETAMINOPHEN 5-325 MG PO TABS
1.0000 | ORAL_TABLET | Freq: Once | ORAL | Status: AC
  Administered 2012-08-19: 1 via ORAL
  Filled 2012-08-19: qty 1

## 2012-08-19 MED ORDER — NITROFURANTOIN MONOHYD MACRO 100 MG PO CAPS: 100.0000 mg | ORAL_CAPSULE | Freq: Two times a day (BID) | ORAL | Status: DC

## 2012-08-19 NOTE — ED Notes (Signed)
Psychiatrist and EDP have spoken with patient and with spouse.  Patient will be discharged home to follow up with her personal psychiatrist.
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
## 2012-08-19 NOTE — ED Notes (Signed)
Tele-psych monitor placed in room and explanation of device given to patient.  Acknowledged and states she understands.  [facility] observing patient from doorway.

## 2012-08-19 NOTE — ED Notes (Signed)
Spouse here with patient.  [facility] has left and patient is under observation of sitter.

## 2012-08-20 LAB — URINE CULTURE

## 2012-08-21 ENCOUNTER — Telehealth (HOSPITAL_COMMUNITY): Payer: Self-pay | Admitting: Emergency Medicine

## 2012-08-21 NOTE — ED Notes (Signed)
[facility] positive for Diphtheroids- no sensitivity report. Chart sent to EDP office for review.

## 2012-10-15 ENCOUNTER — Other Ambulatory Visit: Payer: Self-pay | Admitting: Urology

## 2012-10-15 ENCOUNTER — Ambulatory Visit (INDEPENDENT_AMBULATORY_CARE_PROVIDER_SITE_OTHER): Payer: BC Managed Care – PPO | Admitting: Urology

## 2012-10-15 DIAGNOSIS — N200 Calculus of kidney: Secondary | ICD-10-CM

## 2012-10-15 DIAGNOSIS — N3020 Other chronic cystitis without hematuria: Secondary | ICD-10-CM

## 2012-10-22 ENCOUNTER — Ambulatory Visit (HOSPITAL_COMMUNITY): Payer: BC Managed Care – PPO

## 2012-10-27 ENCOUNTER — Ambulatory Visit (HOSPITAL_COMMUNITY)
Admission: RE | Admit: 2012-10-27 | Discharge: 2012-10-27 | Disposition: A | Discharge status: Home / Self Care | Payer: BC Managed Care – PPO | Source: Ambulatory Visit | Attending: Family Medicine | Admitting: Family Medicine

## 2012-10-27 ENCOUNTER — Other Ambulatory Visit (HOSPITAL_COMMUNITY): Payer: Self-pay | Admitting: Family Medicine

## 2012-10-27 DIAGNOSIS — R0602 Shortness of breath: Secondary | ICD-10-CM | POA: Insufficient documentation

## 2012-10-29 ENCOUNTER — Ambulatory Visit (HOSPITAL_COMMUNITY)
Admission: RE | Admit: 2012-10-29 | Discharge: 2012-10-29 | Disposition: A | Discharge status: Home / Self Care | Payer: BC Managed Care – PPO | Source: Ambulatory Visit | Attending: Urology | Admitting: Urology

## 2012-10-29 DIAGNOSIS — R109 Unspecified abdominal pain: Secondary | ICD-10-CM | POA: Insufficient documentation

## 2012-10-29 DIAGNOSIS — N200 Calculus of kidney: Secondary | ICD-10-CM | POA: Insufficient documentation

## 2012-10-29 DIAGNOSIS — N159 Renal tubulo-interstitial disease, unspecified: Secondary | ICD-10-CM | POA: Insufficient documentation

## 2012-11-19 ENCOUNTER — Ambulatory Visit (INDEPENDENT_AMBULATORY_CARE_PROVIDER_SITE_OTHER): Payer: BC Managed Care – PPO | Admitting: Urology

## 2012-11-19 DIAGNOSIS — N200 Calculus of kidney: Secondary | ICD-10-CM

## 2012-11-19 DIAGNOSIS — R911 Solitary pulmonary nodule: Secondary | ICD-10-CM

## 2012-11-19 DIAGNOSIS — N3020 Other chronic cystitis without hematuria: Secondary | ICD-10-CM

## 2012-11-30 ENCOUNTER — Ambulatory Visit (INDEPENDENT_AMBULATORY_CARE_PROVIDER_SITE_OTHER): Payer: BC Managed Care – PPO | Admitting: Internal Medicine

## 2012-11-30 ENCOUNTER — Encounter: Payer: Self-pay | Admitting: Internal Medicine

## 2012-11-30 VITALS — BP 138/84 | HR 63 | Temp 97.8°F | Ht 63.5 in | Wt 256.0 lb

## 2012-11-30 DIAGNOSIS — R918 Other nonspecific abnormal finding of lung field: Secondary | ICD-10-CM

## 2012-11-30 DIAGNOSIS — R0602 Shortness of breath: Secondary | ICD-10-CM

## 2012-11-30 HISTORY — DX: Other nonspecific abnormal finding of lung field: R91.8

## 2012-11-30 NOTE — Assessment & Plan Note (Addendum)
Symptoms are markedly disproportionate to objective findings and not clear this is a lung problem but pt does appear to have difficult airway management issues. DDX of  difficult airways managment all start with A and  include Adherence, Ace Inhibitors, Acid Reflux, Active Sinus Disease, Alpha 1 Antitripsin deficiency, Anxiety masquerading as Airways dz,  ABPA,  allergy(esp in young), Aspiration (esp in elderly), Adverse effects of DPI,  Active smokers, plus two Bs  = Bronchiectasis and Beta blocker use..and one C= CHF   Adherence is always the initial "prime suspect" and is a multilayered concern that requires a "trust but verify" approach in every patient - starting with knowing how to use medications, especially inhalers, correctly, keeping up with refills and understanding the fundamental difference between maintenance and prns vs those medications only taken for a very short course and then stopped and not refilled. Not clear she's taking what her med list shows she's taking but probably this is not asthma so ok to take saba prn  ? Acid reflux > rec diet (no mint) and Take Prilosec 30-60 min before first meal of the day   ? Anxiety > strongly suggested by lack of symptoms with activity or sleeping   F/u pft's needed given h/o ? Chronic/ recurrent asthma but for now leave off advair and just use saba prn

## 2012-11-30 NOTE — Patient Instructions (Addendum)
GERD (REFLUX)  is an extremely common cause of respiratory symptoms, many times with no significant heartburn at all.    It can be treated with medication, but also with lifestyle changes including avoidance of late meals, excessive alcohol, smoking cessation, and avoid fatty foods, chocolate, peppermint, colas, red wine, and acidic juices such as orange juice.  NO MINT OR MENTHOL PRODUCTS SO NO COUGH DROPS  USE SUGARLESS CANDY INSTEAD (jolley ranchers or Stover's)  NO OIL BASED VITAMINS - use powdered substitutes.    Prilosec 20 mg Take 30-60 min before first meal of the day      Please schedule a follow up office visit in 6 weeks, call sooner if needed with pfts

## 2012-11-30 NOTE — Assessment & Plan Note (Signed)
-   See CT abd  10/29/12  New right lower lobe pulmonary nodularity, primarily ground-  glass in density. This could reflect an inflammatory process,  although follow-up is necessary to exclude atypical neoplasm. Full  chest CT should be considered to evaluate for other pulmonary  Findings  Never smoker but does have h/o uterine ca with f/u CT due in Oct 2014 anyway at [facility] Discussed in detail all the  indications, usual  risks and alternatives  relative to the benefits with patient who agrees to proceed with repeat CT chest then.  Assured here these were most likely benign incidentalomas in a never smoker but definitely need f/u as planned

## 2012-11-30 NOTE — Progress Notes (Signed)
  Subjective:    Patient ID: Molly Berry, female    DOB: 1966-03-09   MRN: [id number]  HPI  29 yowf with h/o "colds go into chest" and need prn albuterol since around 2004 but no regular use then onset sob 2013 referred 11/30/2012 to pulmonary clinic by [facility] with abn ct abd showing mpn on R in w/u of kidney stones  11/30/2012 1st pulmonary eval in EMR cc paroxysms of sob at rest from an hour to all day long sev times a week but never while sleeping,  Not as likely to occur walking, not necessarily responding to albuterol using maybe once a week , never hs or while sleeping.   Med list has Nexium, advair, and acei included but denies taking anything but omeprazole prn in pm   No obvious pattern to daytime variabilty or assoc chronic cough or cp or chest tightness, subjective wheeze overt sinus or hb symptoms. No unusual exp hx or h/o childhood pna/ asthma or knowledge of premature birth.   Sleeping ok without nocturnal  or early am exacerbation  of respiratory  c/o's or need for noct saba. Also denies any obvious fluctuation of symptoms with weather or environmental changes or other aggravating or alleviating factors except as outlined above   H/o uterine ca at [facility], "totally cured" with f/u due Oct 2014    Review of Systems  Constitutional: Negative for fever and unexpected weight change.  HENT: Negative for ear pain, nosebleeds, congestion, sore throat, rhinorrhea, sneezing, trouble swallowing, dental problem, postnasal drip and sinus pressure.   Eyes: Negative for redness and itching.  Respiratory: Positive for shortness of breath. Negative for cough, chest tightness and wheezing.   Cardiovascular: Positive for palpitations ( irregular heartbeats) and leg swelling ( feet swelling).  Gastrointestinal: Negative for nausea and vomiting.  Genitourinary: Negative for dysuria.  Musculoskeletal: Positive for joint swelling and arthralgias.  Skin: Negative for rash.  Neurological:  Positive for headaches.  Hematological: Does not bruise/bleed easily.  Psychiatric/Behavioral: Positive for dysphoric mood. The patient is nervous/anxious.        Objective:   Physical Exam  Pleasant wf  Wt Readings from Last 3 Encounters:  11/30/12 256 lb (116.121 kg)  08/19/12 243 lb (110.224 kg)  08/29/11 210 lb (95.255 kg)    HEENT: nl dentition, turbinates, and orophanx. Nl external ear canals without cough reflex   NECK :  without JVD/Nodes/TM/ nl carotid upstrokes bilaterally   LUNGS: no acc muscle use, clear to A and P bilaterally without cough on insp or exp maneuvers   CV:  RRR  no s3 or murmur or increase in P2, no edema   ABD:  soft and nontender with nl excursion in the supine position. No bruits or organomegaly, bowel sounds nl  MS:  warm without deformities, calf tenderness, cyanosis or clubbing  SKIN: warm and dry without lesions    NEURO:  alert, approp, no deficits    CXR 10/27/12  No acute abnormalities.        Assessment & Plan:

## 2012-12-21 ENCOUNTER — Encounter: Payer: Self-pay | Admitting: Internal Medicine

## 2013-01-11 ENCOUNTER — Ambulatory Visit: Payer: BC Managed Care – PPO | Admitting: Internal Medicine

## 2013-02-25 ENCOUNTER — Encounter (INDEPENDENT_AMBULATORY_CARE_PROVIDER_SITE_OTHER): Payer: Self-pay

## 2013-02-25 ENCOUNTER — Ambulatory Visit (INDEPENDENT_AMBULATORY_CARE_PROVIDER_SITE_OTHER): Payer: BC Managed Care – PPO | Admitting: Urology

## 2013-02-25 DIAGNOSIS — N200 Calculus of kidney: Secondary | ICD-10-CM

## 2013-02-25 DIAGNOSIS — N3020 Other chronic cystitis without hematuria: Secondary | ICD-10-CM

## 2013-04-14 ENCOUNTER — Encounter (HOSPITAL_COMMUNITY): Payer: Self-pay | Admitting: Emergency Medicine

## 2013-04-14 ENCOUNTER — Emergency Department (HOSPITAL_COMMUNITY): Payer: BC Managed Care – PPO

## 2013-04-14 ENCOUNTER — Other Ambulatory Visit: Payer: Self-pay

## 2013-04-14 ENCOUNTER — Emergency Department (HOSPITAL_COMMUNITY)
Admission: EM | Admit: 2013-04-14 | Discharge: 2013-04-14 | Disposition: A | Discharge status: Home / Self Care | Payer: BC Managed Care – PPO | Attending: Emergency Medicine | Admitting: Emergency Medicine

## 2013-04-14 DIAGNOSIS — R209 Unspecified disturbances of skin sensation: Secondary | ICD-10-CM | POA: Insufficient documentation

## 2013-04-14 DIAGNOSIS — Z91040 Latex allergy status: Secondary | ICD-10-CM | POA: Insufficient documentation

## 2013-04-14 DIAGNOSIS — Z8542 Personal history of malignant neoplasm of other parts of uterus: Secondary | ICD-10-CM | POA: Insufficient documentation

## 2013-04-14 DIAGNOSIS — H538 Other visual disturbances: Secondary | ICD-10-CM | POA: Insufficient documentation

## 2013-04-14 DIAGNOSIS — R202 Paresthesia of skin: Secondary | ICD-10-CM

## 2013-04-14 DIAGNOSIS — Z8739 Personal history of other diseases of the musculoskeletal system and connective tissue: Secondary | ICD-10-CM | POA: Insufficient documentation

## 2013-04-14 DIAGNOSIS — R200 Anesthesia of skin: Secondary | ICD-10-CM

## 2013-04-14 DIAGNOSIS — R5381 Other malaise: Secondary | ICD-10-CM | POA: Insufficient documentation

## 2013-04-14 DIAGNOSIS — Z8669 Personal history of other diseases of the nervous system and sense organs: Secondary | ICD-10-CM | POA: Insufficient documentation

## 2013-04-14 DIAGNOSIS — Z880 Allergy status to penicillin: Secondary | ICD-10-CM | POA: Insufficient documentation

## 2013-04-14 DIAGNOSIS — J45909 Unspecified asthma, uncomplicated: Secondary | ICD-10-CM | POA: Insufficient documentation

## 2013-04-14 DIAGNOSIS — E119 Type 2 diabetes mellitus without complications: Secondary | ICD-10-CM | POA: Insufficient documentation

## 2013-04-14 DIAGNOSIS — Z79899 Other long term (current) drug therapy: Secondary | ICD-10-CM | POA: Insufficient documentation

## 2013-04-14 DIAGNOSIS — Q674 Other congenital deformities of skull, face and jaw: Secondary | ICD-10-CM | POA: Insufficient documentation

## 2013-04-14 DIAGNOSIS — F3289 Other specified depressive episodes: Secondary | ICD-10-CM | POA: Insufficient documentation

## 2013-04-14 DIAGNOSIS — F329 Major depressive disorder, single episode, unspecified: Secondary | ICD-10-CM | POA: Insufficient documentation

## 2013-04-14 DIAGNOSIS — Z87442 Personal history of urinary calculi: Secondary | ICD-10-CM | POA: Insufficient documentation

## 2013-04-14 DIAGNOSIS — R0602 Shortness of breath: Secondary | ICD-10-CM | POA: Insufficient documentation

## 2013-04-14 LAB — CBC WITH DIFFERENTIAL/PLATELET
Basophils Absolute: 0 10*3/uL (ref 0.0–0.1)
Basophils Relative: 0 % (ref 0–1)
Eosinophils Absolute: 0.3 10*3/uL (ref 0.0–0.7)
Eosinophils Relative: 4 % (ref 0–5)
HCT: 35.7 % — ABNORMAL LOW (ref 36.0–46.0)
Hemoglobin: 11.1 g/dL — ABNORMAL LOW (ref 12.0–15.0)
Lymphocytes Relative: 26 % (ref 12–46)
Lymphs Abs: 1.8 10*3/uL (ref 0.7–4.0)
MCH: 27.5 pg (ref 26.0–34.0)
MCHC: 31.1 g/dL (ref 30.0–36.0)
MCV: 88.6 fL (ref 78.0–100.0)
Monocytes Absolute: 0.4 10*3/uL (ref 0.1–1.0)
Monocytes Relative: 6 % (ref 3–12)
Neutro Abs: 4.3 10*3/uL (ref 1.7–7.7)
Neutrophils Relative %: 63 % (ref 43–77)
Platelets: 324 10*3/uL (ref 150–400)
RBC: 4.03 MIL/uL (ref 3.87–5.11)
RDW: 13.3 % (ref 11.5–15.5)
WBC: 6.8 10*3/uL (ref 4.0–10.5)

## 2013-04-14 LAB — COMPREHENSIVE METABOLIC PANEL WITH GFR
ALT: 13 U/L (ref 0–35)
AST: 15 U/L (ref 0–37)
Albumin: 3.3 g/dL — ABNORMAL LOW (ref 3.5–5.2)
Alkaline Phosphatase: 111 U/L (ref 39–117)
BUN: 16 mg/dL (ref 6–23)
CO2: 25 meq/L (ref 19–32)
Calcium: 8.8 mg/dL (ref 8.4–10.5)
Chloride: 102 meq/L (ref 96–112)
Creatinine, Ser: 0.88 mg/dL (ref 0.50–1.10)
GFR calc Af Amer: 89 mL/min — ABNORMAL LOW
GFR calc non Af Amer: 77 mL/min — ABNORMAL LOW
Glucose, Bld: 106 mg/dL — ABNORMAL HIGH (ref 70–99)
Potassium: 3.9 meq/L (ref 3.5–5.1)
Sodium: 137 meq/L (ref 135–145)
Total Bilirubin: 0.3 mg/dL (ref 0.3–1.2)
Total Protein: 7 g/dL (ref 6.0–8.3)

## 2013-04-14 LAB — PROTIME-INR
INR: 1.04 (ref 0.00–1.49)
Prothrombin Time: 13.4 seconds (ref 11.6–15.2)

## 2013-04-14 LAB — TROPONIN I: Troponin I: <0.3 ng/mL (ref <0.30)

## 2013-04-14 NOTE — ED Notes (Signed)
Patient transported to MRI 

## 2013-04-14 NOTE — ED Notes (Signed)
Pt ambulates without distress.  

## 2013-04-14 NOTE — ED Notes (Signed)
Pt states she has numbness to left arm and leg. Speech clear. Alertx4

## 2013-04-14 NOTE — ED Notes (Signed)
See paper chart arrived during down time

## 2013-04-14 NOTE — ED Provider Notes (Signed)
CSN: [id number]     Arrival date & time 04/14/13  1849 History  This chart was scribed for Ward Givens, MD by Valera Castle, ED Scribe. This patient was seen in room APA09/APA09 and the patient's care was started at 7:05 PM.   Chief Complaint  Patient presents with  . Numbness   The history is provided by the patient and a friend. No language interpreter was used.   HPI Comments: Molly Berry is a 47 y.o. female brought in by her sister who presents to the Emergency Department complaining of sudden numbness over her left jaw, onset this morning around 11:00 AM. 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 She reports numbness and weakness to her left leg around 5:00 PM this evening, stating that her muscles in her leg were contracting, curling her toes upwards. She reports blurred vision and double vision onset yesterday. Her sister reports that the left side of her face is slightly sagging. Pt reports being right hand dominant. She reports being really upset with her son about school work this morning and receiving a phone call from school, having to meet with teachers about 9 am. She reports her numbness symptoms started about a 1.5 hours after the meetings. She reports SOB, but states that is from scar tissue in her lungs. She is being seen at [facility] and waiting on further treatment. 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 She denies loss of ambulation, trouble speaking, memory loss, nausea, emesis, chest pain, and any other associated symptoms. She reports occasional EtOH use, but denies h/o smoking. She reports that her father, P uncle, MGM, M uncle x 2 have h/o strok.. She reports that they  were all older when they had their strokes.    PCP - Kirk Ruths, MD  Past Medical History  Diagnosis Date  . Depression   . Fibromyalgia   . Diabetes mellitus   . PONV (postoperative nausea and vomiting)   . Asthma   . Cancer   . Uterine cancer   . Thyroid disease   . Hypothyroidism   . Multiple thyroid nodules   . Kidney stones    Past Surgical History  Procedure Laterality Date  . Gastric bypass    . Cholecystectomy    . Dilation and curettage of uterus  12 yrs ago  . Hysteroscopy w/d&c  06/18/2011    Procedure: DILATATION AND CURETTAGE /HYSTEROSCOPY;  Surgeon: Lazaro Arms, MD;  Location: [facility];  Service: Gynecology;  Laterality: N/A;  . Dilation and curettage of uterus  06/18/2011    Procedure: DILATATION AND CURETTAGE;  Surgeon: Lazaro Arms, MD;  Location: [facility];  Service: Gynecology;  Laterality: N/A;  Suction Dilation and Curettage   Family History  Problem Relation Age of Onset  . Anesthesia problems Neg Hx   . Hypotension Neg Hx   . Malignant hyperthermia Neg Hx   . Pseudochol deficiency Neg Hx    History  Substance Use Topics  . Smoking status: Never Smoker   . Smokeless tobacco: Not on file  . Alcohol Use: Yes     Comment: once a month   Lives at home  Stay at home Mother   OB History   Grav Para Term Preterm Abortions TAB SAB Ect Mult Living                 Review of Systems  Eyes: Positive for visual disturbance (blurred and double vision). Negative for pain.  Respiratory: Positive for shortness of breath (from h/o scar tissue in lungs). Negative for chest tightness.   Cardiovascular: Negative for chest pain.  Gastrointestinal: Negative for nausea and vomiting.  Musculoskeletal: Negative for arthralgias, gait problem and myalgias.  Neurological: Positive for facial asymmetry (left side sagging), weakness (left side of body, face, arm, leg) and numbness (Left jaw, left arm, left leg.). Negative for speech difficulty.   Psychiatric/Behavioral: Negative for confusion.  All other systems reviewed and are negative.    Allergies  Bee venom; Cephalexin; Penicillins; Adhesive; and Latex  Home Medications   Current Outpatient Rx  Name  Route  Sig  Dispense  Refill  . albuterol (PROVENTIL HFA;VENTOLIN HFA) 108 (90 BASE) MCG/ACT inhaler   Inhalation   Inhale 2 puffs into the lungs every 6 (six) hours as needed. For asthma          . ALPRAZolam (XANAX) 1 MG tablet   Oral   Take 1 mg by mouth 3 (three) times daily as needed. FOR ANXIETY AND SLEEP         . b complex vitamins capsule   Oral   Take 2 capsules by mouth every morning.          . Cholecalciferol (VITAMIN D PO)   Oral   Take 1 tablet by mouth 2 (two) times daily.          Marland Kitchen docusate sodium (COLACE) 100 MG capsule   Oral   Take 100 mg by mouth 2 (two) times daily.           . DULoxetine (CYMBALTA) 60 MG capsule   Oral   Take 120 mg by mouth every morning.          . Iron Combinations (IRON COMPLEX PO)   Oral   Take 2 tablets by mouth every morning.         . lamoTRIgine (LAMICTAL) 100 MG tablet   Oral   Take 100 mg by mouth 2 (two) times daily.          . Melatonin 10 MG TABS   Oral   Take 1 tablet by mouth at bedtime.          . Multiple Vitamins-Minerals (MULTIVITAMIN WITH MINERALS) tablet   Oral   Take 1 tablet by mouth every morning.          Marland Kitchen oxyCODONE (ROXICODONE) 15 MG immediate release tablet   Oral   Take 15 mg by mouth every 4 (four) hours as needed. FOR PAIN         . tiZANidine (ZANAFLEX) 4 MG tablet   Oral   Take 4 mg by mouth at bedtime. FOR SPASMS         . trimethoprim (TRIMPEX) 100 MG tablet   Oral   Take 1 tablet by mouth daily.         Marland Kitchen zinc gluconate 50 MG tablet   Oral   Take 50 mg by mouth 2 (two) times daily.           VS26   Physical Exam  Nursing note and vitals reviewed. Constitutional: She is oriented to person, place, and time. She appears  well-developed  and well-nourished.  Non-toxic appearance. She does not appear ill. No distress.  HENT:  Head: Normocephalic and atraumatic.  Right Ear: External ear normal.  Left Ear: External ear normal.  Nose: Nose normal. No mucosal edema or rhinorrhea.  Mouth/Throat: Oropharynx is clear and moist and mucous membranes are normal. No dental abscesses or uvula swelling.  Left facial asymmetry.   Eyes: Conjunctivae and EOM are normal. Pupils are equal, round, and reactive to light.  Neck: Normal range of motion and full passive range of motion without pain. Neck supple.  Cardiovascular: Normal rate, regular rhythm and normal heart sounds.  Exam reveals no gallop and no friction rub.   No murmur heard. Pulmonary/Chest: Effort normal and breath sounds normal. No respiratory distress. She has no wheezes. She has no rhonchi. She has no rales. She exhibits no tenderness and no crepitus.  Abdominal: Soft. Normal appearance and bowel sounds are normal. She exhibits no distension. There is no tenderness. There is no rebound and no guarding.  Musculoskeletal: Normal range of motion. She exhibits no edema and no tenderness.  Moves all extremities well.   Neurological: She is alert and oriented to person, place, and time. She has normal strength. No sensory deficit.  Grip strength equal bilaterally. Negative pronator drift. Difficulty holding left leg against gravity, but was able to do so. Strength normal in right leg.   Skin: Skin is warm, dry and intact. No rash noted. No erythema. No pallor.  Psychiatric: She has a normal mood and affect. Her speech is normal and behavior is normal. Her mood appears not anxious.    ED Course  Procedures (including critical care time)  COORDINATION OF CARE: 7:09 PM-Discussed treatment plan with pt at bedside and pt agreed to plan.   9:16 PM - Discussed with pt that her MRI was negative for stroke. Pt reports her leg numbness has subsided, her arm numbness is  improving, but her face is still numb. Will get CT cervical spine to r/o spinal etiology of her numbness.   Labs Review Results for orders placed during the hospital encounter of 04/14/13  CBC WITH DIFFERENTIAL      Result Value Range   WBC 6.8  4.0 - 10.5 K/uL   RBC 4.03  3.87 - 5.11 MIL/uL   Hemoglobin 11.1 (*) 12.0 - 15.0 g/dL   HCT 40.9 (*) 81.1 - 91.4 %   MCV 88.6  78.0 - 100.0 fL   MCH 27.5  26.0 - 34.0 pg   MCHC 31.1  30.0 - 36.0 g/dL   RDW 78.2  95.6 - 21.3 %   Platelets 324  150 - 400 K/uL   Neutrophils Relative % 63  43 - 77 %   Neutro Abs 4.3  1.7 - 7.7 K/uL   Lymphocytes Relative 26  12 - 46 %   Lymphs Abs 1.8  0.7 - 4.0 K/uL   Monocytes Relative 6  3 - 12 %   Monocytes Absolute 0.4  0.1 - 1.0 K/uL   Eosinophils Relative 4  0 - 5 %   Eosinophils Absolute 0.3  0.0 - 0.7 K/uL   Basophils Relative 0  0 - 1 %   Basophils Absolute 0.0  0.0 - 0.1 K/uL  COMPREHENSIVE METABOLIC PANEL      Result Value Range   Sodium 137  135 - 145 mEq/L   Potassium 3.9  3.5 - 5.1 mEq/L   Chloride 102  96 - 112 mEq/L   CO2 25  19 -  32 mEq/L   Glucose, Bld 106 (*) 70 - 99 mg/dL   BUN 16  6 - 23 mg/dL   Creatinine, Ser 0.16  0.50 - 1.10 mg/dL   Calcium 8.8  8.4 - 01.0 mg/dL   Total Protein 7.0  6.0 - 8.3 g/dL   Albumin 3.3 (*) 3.5 - 5.2 g/dL   AST 15  0 - 37 U/L   ALT 13  0 - 35 U/L   Alkaline Phosphatase 111  39 - 117 U/L   Total Bilirubin 0.3  0.3 - 1.2 mg/dL   GFR calc non Af Amer 77 (*) >90 mL/min   GFR calc Af Amer 89 (*) >90 mL/min  APTT      Result Value Range   aPTT 32  24 - 37 seconds  PROTIME-INR      Result Value Range   Prothrombin Time 13.4  11.6 - 15.2 seconds   INR 1.04  0.00 - 1.49  TROPONIN I      Result Value Range   Troponin I <0.30  <0.30 ng/mL   Laboratory interpretation all normal except mild anemia   Imaging Review Ct Cervical Spine Wo Contrast  04/14/2013   CLINICAL DATA:  Numbness in left face and arm  EXAM: CT CERVICAL SPINE WITHOUT CONTRAST   TECHNIQUE: Multidetector CT imaging of the cervical spine was performed without intravenous contrast. Multiplanar CT image reconstructions were also generated.  COMPARISON:  Prior CT from 01/12/2011.  FINDINGS: The vertebral bodies are normally aligned with preservation of the normal cervical lordosis. Vertebral body heights are well preserved. Normal C1-2 articulations are intact. No acute fracture or listhesis identified. There is no prevertebral soft tissue swelling.  No significant degenerative disc disease or facet arthropathy identified within the cervical spine. No significant canal or neural foraminal stenosis is identified to explain left arm numbness.  Visualized soft tissues of the neck are within normal limits. The thyroid gland is somewhat heterogeneous in appearance. A and 5 mm hypodense nodule is present within the left lobe of thyroid (series 3, image 74), not significantly changed relative to prior exam.  Visualized lung apices are clear.  IMPRESSION: Normal CT of the cervical spine without significant degenerative disc disease or facet arthropathy. No significant canal or neural foraminal narrowing identified to explain left arm numbness. Overall, the appearance of the spine is not significantly changed relative to prior CT from 01/12/2011.   Electronically Signed   By: Rise Mu M.D.   On: 04/14/2013 22:48   Mr Brain Wo Contrast  04/14/2013   CLINICAL DATA:  Left facial and left body numbness. Stroke risk factors include diabetes mellitus.  EXAM: MRI HEAD WITHOUT CONTRAST  TECHNIQUE: Multiplanar, multiecho pulse sequences of the brain and surrounding structures were obtained without intravenous contrast.  COMPARISON:  CT head 01/12/2011.  FINDINGS: No evidence for acute infarction, hemorrhage, mass lesion, hydrocephalus, or extra-axial fluid. Normal cerebral volume. No significant white matter disease. Flow voids are maintained throughout the carotid, basilar, and vertebral  arteries. There are no areas of chronic hemorrhage. Pituitary, pineal, and cerebellar tonsils unremarkable. No upper cervical lesions. Visualized calvarium, skull base, and upper cervical osseous structures unremarkable. Scalp and extracranial soft tissues, orbits, sinuses, and mastoids show no acute process.  IMPRESSION: Unremarkable cranial MRI.  No change from prior normal CT study.   Electronically Signed   By: Davonna Belling M.D.   On: 04/14/2013 20:53    EKG Interpretation   None  Date: 04/14/2013  Rate: 73  Rhythm: normal sinus rhythm  QRS Axis: normal  Intervals: normal  ST/T Wave abnormalities: normal  Conduction Disutrbances:none  Narrative Interpretation:   Old EKG Reviewed: none available    MDM   1. Paresthesia of left arm and leg   2. Lt facial numbness     Plan discharge  Devoria Albe, MD, FACEP    I personally performed the services described in this documentation, which was scribed in my presence. The recorded information has been reviewed and considered.  Devoria Albe, MD, Armando Gang    Ward Givens, MD 04/14/13 [phone]

## 2013-09-15 ENCOUNTER — Encounter: Payer: Self-pay | Admitting: Internal Medicine

## 2013-10-11 ENCOUNTER — Encounter (INDEPENDENT_AMBULATORY_CARE_PROVIDER_SITE_OTHER): Payer: Self-pay

## 2013-10-11 ENCOUNTER — Other Ambulatory Visit: Payer: Self-pay | Admitting: Internal Medicine

## 2013-10-11 ENCOUNTER — Ambulatory Visit (INDEPENDENT_AMBULATORY_CARE_PROVIDER_SITE_OTHER): Payer: BC Managed Care – PPO | Admitting: Gastroenterology

## 2013-10-11 VITALS — BP 134/84 | Temp 98.4°F | Resp 18 | Ht 64.0 in | Wt 280.6 lb

## 2013-10-11 DIAGNOSIS — R1013 Epigastric pain: Secondary | ICD-10-CM

## 2013-10-11 DIAGNOSIS — K921 Melena: Secondary | ICD-10-CM

## 2013-10-11 LAB — HEPATIC FUNCTION PANEL
ALT: 10 U/L (ref 0–35)
AST: 13 U/L (ref 0–37)
Albumin: 3.4 g/dL — ABNORMAL LOW (ref 3.5–5.2)
Alkaline Phosphatase: 122 U/L — ABNORMAL HIGH (ref 39–117)
Bilirubin, Direct: 0.2 mg/dL (ref 0.0–0.3)
Indirect Bilirubin: 0 mg/dL — ABNORMAL LOW (ref 0.2–1.2)
Total Bilirubin: 0.2 mg/dL — ABNORMAL LOW (ref 0.2–1.2)
Total Protein: 6.8 g/dL (ref 6.0–8.3)

## 2013-10-11 LAB — CBC
HEMATOCRIT: 34.7 % — AB (ref 36.0–46.0)
Hemoglobin: 10.7 g/dL — ABNORMAL LOW (ref 12.0–15.0)
MCH: 27.2 pg (ref 26.0–34.0)
MCHC: 30.8 g/dL (ref 30.0–36.0)
MCV: 88.3 fL (ref 78.0–100.0)
Platelets: 456 10*3/uL — ABNORMAL HIGH (ref 150–400)
RBC: 3.93 MIL/uL (ref 3.87–5.11)
RDW: 14.5 % (ref 11.5–15.5)
WBC: 6 10*3/uL (ref 4.0–10.5)

## 2013-10-11 MED ORDER — PANTOPRAZOLE SODIUM 40 MG PO TBEC: 40.0000 mg | DELAYED_RELEASE_TABLET | Freq: Every day | ORAL | Status: DC

## 2013-10-11 NOTE — Progress Notes (Signed)
Primary Care Physician:  Leonides Grills, MD Primary Gastroenterologist:  Dr. Gala Romney  Chief Complaint  Patient presents with  . Pain    severe  . GI Problem    passing very Large clots while having BM very dark Stool  with a very foul odor    HPI:   Molly Berry presents today as an urgent referral at the request of Dr. Ethlyn Gallery secondary to abdominal pain and evidence of possible GI bleeding. Patient has a history significant for gastric bypass 2 years ago at [facility]. She also notes what sounds like a possible ERCP in remote past by Dr. Gala Romney secondary to gallstone pancreatitis, subsequent cholecystectomy. No prior colonoscopy. Notes for the last few months has had some issues. Did a hemoccult test a few weeks ago through PCP and said it was positive. Yesterday had severe epigastric   abdominal pain, then pure black stool and large black "clot". No bright red blood.  States she has had epigastric pain intermittently for the past few months. Worsened with eating. After passing stool took 2 Prevacid, drank lots of Maalox. Took Zofran. Couldn't eat anything. Ate some breakfast this morning and noted epigastric pain. Took more Prevacid this morning. No further black stool. No hematochezia. No aspirin powders, NSAIDs. Notes weakness, fatigue for last 4-6 months. Taking 2-3 Prilosec a day due to epigastric pain. States last 6-8 weeks has had abdominal pain. Intermittent at first but now constant. Worsened by eating. No dysphagia.   Outside labs reviewed. Hgb noted to be 11.7 in March 2015, with recheck in mid May 2015 10.9. Normocytic anemia. Ferritin low normal at 35.   Past Medical History  Diagnosis Date  . Depression   . Fibromyalgia   . Diabetes mellitus   . PONV (postoperative nausea and vomiting)   . Asthma   . Cancer   . Uterine cancer   . Thyroid disease   . Hypothyroidism   . Multiple thyroid nodules   . Kidney stones     Past Surgical History  Procedure  Laterality Date  . Gastric bypass  2013    [facility]  . Cholecystectomy    . Dilation and curettage of uterus  12 yrs ago  . Hysteroscopy w/d&c  06/18/2011    Procedure: DILATATION AND CURETTAGE /HYSTEROSCOPY;  Surgeon: Florian Buff, MD;  Location: [facility];  Service: Gynecology;  Laterality: N/A;  . Dilation and curettage of uterus  06/18/2011    Procedure: DILATATION AND CURETTAGE;  Surgeon: Florian Buff, MD;  Location: [facility];  Service: Gynecology;  Laterality: N/A;  Suction Dilation and Curettage    Current Outpatient Prescriptions  Medication Sig Dispense Refill  . albuterol (PROVENTIL HFA;VENTOLIN HFA) 108 (90 BASE) MCG/ACT inhaler Inhale 2 puffs into the lungs every 6 (six) hours as needed. For asthma       . ALPRAZolam (XANAX) 1 MG tablet Take 1 mg by mouth 3 (three) times daily as needed. FOR ANXIETY AND SLEEP      . Cholecalciferol (VITAMIN D PO) Take 1 tablet by mouth 2 (two) times daily.       . DULoxetine (CYMBALTA) 60 MG capsule Take 120 mg by mouth every morning.       . lamoTRIgine (LAMICTAL) 100 MG tablet Take 100 mg by mouth 2 (two) times daily.       . Melatonin 10 MG TABS Take 1 tablet by mouth at bedtime.       . Multiple Vitamins-Minerals (MULTIVITAMIN  WITH MINERALS) tablet Take 1 tablet by mouth every morning.       Marland Kitchen omeprazole (PRILOSEC) 20 MG capsule Take 20 mg by mouth daily as needed.      . SUMAtriptan (IMITREX) 100 MG tablet Take 100 mg by mouth as needed for migraine.       Marland Kitchen tiZANidine (ZANAFLEX) 4 MG tablet Take 4 mg by mouth at bedtime. FOR SPASMS       No current facility-administered medications for this visit.    Allergies as of 10/11/2013 - Review Complete 10/11/2013  Allergen Reaction Noted  . Bee venom Shortness Of Breath, Swelling, and Rash 02/07/2011  . Cephalexin Anaphylaxis 01/12/2011  . Penicillins Hives 01/12/2011  . Adhesive [tape] Rash 04/14/2013  . Latex Rash 04/14/2013    Family History  Problem Relation Age of Onset  . Anesthesia  problems Neg Hx   . Hypotension Neg Hx   . Malignant hyperthermia Neg Hx   . Pseudochol deficiency Neg Hx   . Colon cancer Neg Hx     History   Social History  . Marital Status: Married    Spouse Name: N/A    Number of Children: N/A  . Years of Education: N/A   Occupational History  . seamstress(self employed)    Social History Main Topics  . Smoking status: Never Smoker   . Smokeless tobacco: Not on file  . Alcohol Use: Yes     Comment: once a month  . Drug Use: No  . Sexual Activity: Yes    Birth Control/ Protection: None   Other Topics Concern  . Not on file   Social History Narrative  . No narrative on file    Review of Systems: As mentioned in HPI  Physical Exam: BP 134/84  Temp(Src) 98.4 F (36.9 C) (Oral)  Resp 18  Ht 5\' 4"  (1.626 m)  Wt 280 lb 9.6 oz (127.279 kg)  BMI 48.14 kg/m2  LMP 05/06/2011 General:   Alert and oriented. Pleasant and cooperative. Well-nourished and well-developed.  Head:  Normocephalic and atraumatic. Eyes:  Without icterus, sclera clear and conjunctiva pink.  Ears:  Normal auditory acuity. Nose:  No deformity, discharge,  or lesions. Mouth:  No deformity or lesions, oral mucosa pink.  Lungs:  Clear to auscultation bilaterally. No wheezes, rales, or rhonchi. No distress.  Heart:  S1, S2 present without murmurs appreciated.  Abdomen:  +BS, soft, TTP epigastric region and non-distended. Obese. Unable to appreciate HSM. Rectal:  Deferred  Msk:  Symmetrical without gross deformities. Normal posture. Extremities:  Without clubbing or edema. Neurologic:  Alert and  oriented x4;  grossly normal neurologically. Skin:  Intact without significant lesions or rashes. Psych:  Alert and cooperative. Normal mood and affect.   Outside labs to be abstracted. In HPI.

## 2013-10-11 NOTE — Assessment & Plan Note (Signed)
48 year old female with several month history of intermittent epigastric pain that is aggravated with oral intake, with surgical history notable for gastric bypass at [facility] in 2013. With evidence of large, black stool yesterday, concern for upper GI bleed. With the history of Roux-en-Y anatomy, could have anastomotic ulceration. She avoids NSAIDs and aspirin powders. Does not appear acutely ill at this visit, and she is taking large amounts of OTC PPIs with only some improvement in overall discomfort.   Needs EGD with Dr. Gala Romney on 6/10. Risks and benefits discussed in detail with stated understanding. She is aware to contact us or present to the ED if any worsening of abdominal pain or further possible melena.  Stop OTC agents. Start Protonix daily. STAT CBC and HFP.  Colonoscopy will need to be pursued in a more elective setting due to reported heme positive stool. No colonoscopy planned for tomorrow, as it appears clinically she has an upper GI process.

## 2013-10-11 NOTE — Patient Instructions (Addendum)
We have scheduled you for an upper endoscopy with Dr. Gala Romney as soon as possible. If you have any more evidence of bleeding or severe abdominal pain, go to the emergency room.   Please complete blood work today. We will contact you if your blood count has dropped significantly. Otherwise, proceed with your endoscopy as planned.   You will need a colonoscopy in a more elective setting once these acute issues are addressed.

## 2013-10-11 NOTE — Assessment & Plan Note (Signed)
Stat CBC now.

## 2013-10-12 ENCOUNTER — Encounter (HOSPITAL_COMMUNITY): Payer: Self-pay | Admitting: *Deleted

## 2013-10-12 ENCOUNTER — Ambulatory Visit (HOSPITAL_COMMUNITY)
Admission: RE | Admit: 2013-10-12 | Discharge: 2013-10-12 | Disposition: A | Discharge status: Home / Self Care | Payer: BC Managed Care – PPO | Source: Ambulatory Visit | Attending: Internal Medicine | Admitting: Internal Medicine

## 2013-10-12 ENCOUNTER — Encounter (HOSPITAL_COMMUNITY)
Admission: RE | Disposition: A | Discharge status: Home / Self Care | Payer: Self-pay | Source: Ambulatory Visit | Attending: Internal Medicine

## 2013-10-12 DIAGNOSIS — Z87442 Personal history of urinary calculi: Secondary | ICD-10-CM | POA: Insufficient documentation

## 2013-10-12 DIAGNOSIS — E119 Type 2 diabetes mellitus without complications: Secondary | ICD-10-CM | POA: Insufficient documentation

## 2013-10-12 DIAGNOSIS — IMO0001 Reserved for inherently not codable concepts without codable children: Secondary | ICD-10-CM | POA: Insufficient documentation

## 2013-10-12 DIAGNOSIS — J45909 Unspecified asthma, uncomplicated: Secondary | ICD-10-CM | POA: Insufficient documentation

## 2013-10-12 DIAGNOSIS — Z91038 Other insect allergy status: Secondary | ICD-10-CM | POA: Insufficient documentation

## 2013-10-12 DIAGNOSIS — K259 Gastric ulcer, unspecified as acute or chronic, without hemorrhage or perforation: Secondary | ICD-10-CM

## 2013-10-12 DIAGNOSIS — F3289 Other specified depressive episodes: Secondary | ICD-10-CM | POA: Insufficient documentation

## 2013-10-12 DIAGNOSIS — Z8542 Personal history of malignant neoplasm of other parts of uterus: Secondary | ICD-10-CM | POA: Insufficient documentation

## 2013-10-12 DIAGNOSIS — R1013 Epigastric pain: Secondary | ICD-10-CM

## 2013-10-12 DIAGNOSIS — K921 Melena: Secondary | ICD-10-CM

## 2013-10-12 DIAGNOSIS — Z880 Allergy status to penicillin: Secondary | ICD-10-CM | POA: Insufficient documentation

## 2013-10-12 DIAGNOSIS — E039 Hypothyroidism, unspecified: Secondary | ICD-10-CM | POA: Insufficient documentation

## 2013-10-12 DIAGNOSIS — Z91040 Latex allergy status: Secondary | ICD-10-CM | POA: Insufficient documentation

## 2013-10-12 DIAGNOSIS — Z9884 Bariatric surgery status: Secondary | ICD-10-CM

## 2013-10-12 DIAGNOSIS — F329 Major depressive disorder, single episode, unspecified: Secondary | ICD-10-CM | POA: Insufficient documentation

## 2013-10-12 DIAGNOSIS — K289 Gastrojejunal ulcer, unspecified as acute or chronic, without hemorrhage or perforation: Secondary | ICD-10-CM | POA: Insufficient documentation

## 2013-10-12 DIAGNOSIS — Z79899 Other long term (current) drug therapy: Secondary | ICD-10-CM | POA: Insufficient documentation

## 2013-10-12 DIAGNOSIS — Z881 Allergy status to other antibiotic agents status: Secondary | ICD-10-CM | POA: Insufficient documentation

## 2013-10-12 HISTORY — DX: Gastro-esophageal reflux disease without esophagitis: K21.9

## 2013-10-12 HISTORY — PX: ESOPHAGOGASTRODUODENOSCOPY: SHX5428

## 2013-10-12 SURGERY — EGD (ESOPHAGOGASTRODUODENOSCOPY)
Anesthesia: Moderate Sedation

## 2013-10-12 MED ORDER — LIDOCAINE VISCOUS 2 % MT SOLN
OROMUCOSAL | Status: AC
  Filled 2013-10-12: qty 15

## 2013-10-12 MED ORDER — MEPERIDINE HCL 100 MG/ML IJ SOLN
INTRAMUSCULAR | Status: AC
  Filled 2013-10-12: qty 2

## 2013-10-12 MED ORDER — LIDOCAINE VISCOUS 2 % MT SOLN
OROMUCOSAL | Status: DC | PRN
  Administered 2013-10-12 (×2): 2 mL via OROMUCOSAL

## 2013-10-12 MED ORDER — ONDANSETRON HCL 4 MG/2ML IJ SOLN
INTRAMUSCULAR | Status: AC
  Filled 2013-10-12: qty 2

## 2013-10-12 MED ORDER — MIDAZOLAM HCL 5 MG/5ML IJ SOLN
INTRAMUSCULAR | Status: DC | PRN
  Administered 2013-10-12 (×3): 2 mg via INTRAVENOUS
  Administered 2013-10-12: 1 mg via INTRAVENOUS

## 2013-10-12 MED ORDER — MEPERIDINE HCL 100 MG/ML IJ SOLN
INTRAMUSCULAR | Status: DC | PRN
  Administered 2013-10-12: 50 mg via INTRAVENOUS
  Administered 2013-10-12: 25 mg via INTRAVENOUS

## 2013-10-12 MED ORDER — MIDAZOLAM HCL 5 MG/5ML IJ SOLN
INTRAMUSCULAR | Status: AC
  Filled 2013-10-12: qty 10

## 2013-10-12 MED ORDER — PROMETHAZINE HCL 25 MG/ML IJ SOLN
25.0000 mg | Freq: Once | INTRAMUSCULAR | Status: AC
  Administered 2013-10-12: 25 mg via INTRAVENOUS

## 2013-10-12 MED ORDER — MEPERIDINE HCL 100 MG/ML IJ SOLN
INTRAMUSCULAR | Status: AC
  Filled 2013-10-12: qty 1

## 2013-10-12 MED ORDER — PROMETHAZINE HCL 25 MG/ML IJ SOLN
INTRAMUSCULAR | Status: AC
  Filled 2013-10-12: qty 1

## 2013-10-12 MED ORDER — STERILE WATER FOR IRRIGATION IR SOLN
Status: DC | PRN
  Administered 2013-10-12: 16:00:00

## 2013-10-12 MED ORDER — SODIUM CHLORIDE 0.9 % IV SOLN
INTRAVENOUS | Status: DC
  Administered 2013-10-12: 1000 mL via INTRAVENOUS

## 2013-10-12 NOTE — Op Note (Signed)
[facility] [address] [place], [address]   ENDOSCOPY PROCEDURE REPORT  PATIENT: Molly Berry, Molly Berry  MR#: [id number] BIRTHDATE: Nov 13, 1965 , 47  yrs. old GENDER: Female ENDOSCOPIST: R.  Garfield Cornea, MD FACP FACG REFERRED BY:  Micheline Rough, M.D. PROCEDURE DATE:  10/12/2013 PROCEDURE:     EGD with gastric biopsy  INDICATIONS:     epigastric pain; melena drop in hemoglobin  INFORMED CONSENT:   The risks, benefits, limitations, alternatives and imponderables have been discussed.  The potential for biopsy, esophogeal dilation, etc. have also been reviewed.  Questions have been answered.  All parties agreeable.  Please see the history and physical in the medical record for more information.  MEDICATIONS:       Versed 7 mg IV and Demerol  75 mg IV in divided doses. Phenergan 25 mg IV. Zofran 4 mg IV, Xylocaine gel orally  DESCRIPTION OF PROCEDURE:   The [id number] ([id number])  endoscope was introduced through the mouth and advanced to the second portion of the duodenum without difficulty or limitations.  The mucosal surfaces were surveyed very carefully during advancement of the scope and upon withdrawal.  Retroflexion view of the proximal stomach and esophagogastric junction was performed.      FINDINGS: Normal esophagus. Surgically altered stomach  with small gastric pouch which led to anastomosis with the small bowel; there was one widely patent efferent limb; at the anastomosis there was a 1.5 cm deep anastomotic ulcer with a surgical staples in the ulcer bed. This ulcer had a clean base. It appeared to be benign.  THERAPEUTIC / DIAGNOSTIC MANEUVERS PERFORMED:  2 biopsies of the ulcer base periphery performed for histologic study. There wasminimal bleeding with this maneuver.  COMPLICATIONS:  None  IMPRESSION:  Status post bariatric surgery. Anastomotic ulcer as described above-likely cause of GI bleeding. Likely ischemic  in origin.  RECOMMENDATIONS:   Avoid all nonsteroidal agents. Increase PPI to twice a day;  add Carafate 1 g suspension 4 times a day. Followup on pathology. Mild bump in LFTs-unexplained. We'll go ahead and get a right upper quadrant ultrasound as well.    _______________________________ R. Garfield Cornea, MD FACP [facility] eSigned:  R. Garfield Cornea, MD FACP [facility] 10/12/2013 4:25 PM     CC:  PATIENT NAME:  [name], [name] MR#: [id number]

## 2013-10-12 NOTE — Progress Notes (Signed)
Witnessed wasting 3 mg versed with Lurline Del, RN at [phone].

## 2013-10-12 NOTE — Interval H&P Note (Signed)
History and Physical Interval Note:  10/12/2013 3:30 PM  Molly Berry  has presented today for surgery, with the diagnosis of ABDOMINAL PAIN, MELENA  The various methods of treatment have been discussed with the patient and family. After consideration of risks, benefits and other options for treatment, the patient has consented to  Procedure(s) with comments: ESOPHAGOGASTRODUODENOSCOPY (EGD) (N/A) - 2:00 as a surgical intervention .  The patient's history has been reviewed, patient examined, no change in status, stable for surgery.  I have reviewed the patient's chart and labs.  Questions were answered to the patient's satisfaction.     Epigastric pain better today. EGD per plan.The risks, benefits, limitations, alternatives and imponderables have been reviewed with the patient. Potential for esophageal dilation, biopsy, etc. have also been reviewed.  Questions have been answered. All parties agreeable.  Manus Rudd

## 2013-10-12 NOTE — Progress Notes (Signed)
Pulled Versed 10mg  on Molly Berry instead of Samanda, Buske. Tried to return to pyxis. Would not let me. Wasted 3mg ; gave 7mg . Documented on MAR. Witnessed by Lupe Carney.

## 2013-10-12 NOTE — Discharge Instructions (Addendum)
EGD Discharge instructions Please read the instructions outlined below and refer to this sheet in the next few weeks. These discharge instructions provide you with general information on caring for yourself after you leave the hospital. Your doctor may also give you specific instructions. While your treatment has been planned according to the most current medical practices available, unavoidable complications occasionally occur. If you have any problems or questions after discharge, please call your doctor. ACTIVITY  You may resume your regular activity but move at a slower pace for the next 24 hours.   Take frequent rest periods for the next 24 hours.   Walking will help expel (get rid of) the air and reduce the bloated feeling in your abdomen.   No driving for 24 hours (because of the anesthesia (medicine) used during the test).   You may shower.   Do not sign any important legal documents or operate any machinery for 24 hours (because of the anesthesia used during the test).  NUTRITION  Drink plenty of fluids.   You may resume your normal diet.   Begin with a light meal and progress to your normal diet.   Avoid alcoholic beverages for 24 hours or as instructed by your caregiver.  MEDICATIONS  You may resume your normal medications unless your caregiver tells you otherwise.  WHAT YOU CAN EXPECT TODAY  You may experience abdominal discomfort such as a feeling of fullness or gas pains.  FOLLOW-UP  Your doctor will discuss the results of your test with you.  SEEK IMMEDIATE MEDICAL ATTENTION IF ANY OF THE FOLLOWING OCCUR:  Excessive nausea (feeling sick to your stomach) and/or vomiting.   Severe abdominal pain and distention (swelling).   Trouble swallowing.   Temperature over 101 F (37.8 C).   Rectal bleeding or vomiting of blood.    Absolutely avoid all forms of aspirin nonsteroidal agents  Protonix 40 mg twice daily  Carafate 1 g suspension 4 times a  day  Right upper quadrant ultrasound to further evaluate minimally elevated liver enzymes  Further recommendations to follow pending review of pathology report

## 2013-10-12 NOTE — Progress Notes (Signed)
Quick Note:  Hgb down to 10.7 from 11.1 in Dec 2014. Overall not an impressive drop.  LFTs with just mild elevation of Alk Phos, non-specific. EGD today.    ______

## 2013-10-12 NOTE — H&P (View-Only) (Signed)
Primary Care Physician:  Leonides Grills, MD Primary Gastroenterologist:  Dr. Gala Romney  Chief Complaint  Patient presents with  . Pain    severe  . GI Problem    passing very Large clots while having BM very dark Stool  with a very foul odor    HPI:   Molly Berry presents today as an urgent referral at the request of Dr. Ethlyn Gallery secondary to abdominal pain and evidence of possible GI bleeding. Patient has a history significant for gastric bypass 2 years ago at [facility]. She also notes what sounds like a possible ERCP in remote past by Dr. Gala Romney secondary to gallstone pancreatitis, subsequent cholecystectomy. No prior colonoscopy. Notes for the last few months has had some issues. Did a hemoccult test a few weeks ago through PCP and said it was positive. Yesterday had severe epigastric   abdominal pain, then pure black stool and large black "clot". No bright red blood.  States she has had epigastric pain intermittently for the past few months. Worsened with eating. After passing stool took 2 Prevacid, drank lots of Maalox. Took Zofran. Couldn't eat anything. Ate some breakfast this morning and noted epigastric pain. Took more Prevacid this morning. No further black stool. No hematochezia. No aspirin powders, NSAIDs. Notes weakness, fatigue for last 4-6 months. Taking 2-3 Prilosec a day due to epigastric pain. States last 6-8 weeks has had abdominal pain. Intermittent at first but now constant. Worsened by eating. No dysphagia.   Outside labs reviewed. Hgb noted to be 11.7 in March 2015, with recheck in mid May 2015 10.9. Normocytic anemia. Ferritin low normal at 35.   Past Medical History  Diagnosis Date  . Depression   . Fibromyalgia   . Diabetes mellitus   . PONV (postoperative nausea and vomiting)   . Asthma   . Cancer   . Uterine cancer   . Thyroid disease   . Hypothyroidism   . Multiple thyroid nodules   . Kidney stones     Past Surgical History  Procedure  Laterality Date  . Gastric bypass  2013    [facility]  . Cholecystectomy    . Dilation and curettage of uterus  12 yrs ago  . Hysteroscopy w/d&c  06/18/2011    Procedure: DILATATION AND CURETTAGE /HYSTEROSCOPY;  Surgeon: Florian Buff, MD;  Location: [facility];  Service: Gynecology;  Laterality: N/A;  . Dilation and curettage of uterus  06/18/2011    Procedure: DILATATION AND CURETTAGE;  Surgeon: Florian Buff, MD;  Location: [facility];  Service: Gynecology;  Laterality: N/A;  Suction Dilation and Curettage    Current Outpatient Prescriptions  Medication Sig Dispense Refill  . albuterol (PROVENTIL HFA;VENTOLIN HFA) 108 (90 BASE) MCG/ACT inhaler Inhale 2 puffs into the lungs every 6 (six) hours as needed. For asthma       . ALPRAZolam (XANAX) 1 MG tablet Take 1 mg by mouth 3 (three) times daily as needed. FOR ANXIETY AND SLEEP      . Cholecalciferol (VITAMIN D PO) Take 1 tablet by mouth 2 (two) times daily.       . DULoxetine (CYMBALTA) 60 MG capsule Take 120 mg by mouth every morning.       . lamoTRIgine (LAMICTAL) 100 MG tablet Take 100 mg by mouth 2 (two) times daily.       . Melatonin 10 MG TABS Take 1 tablet by mouth at bedtime.       . Multiple Vitamins-Minerals (MULTIVITAMIN  WITH MINERALS) tablet Take 1 tablet by mouth every morning.       Marland Kitchen omeprazole (PRILOSEC) 20 MG capsule Take 20 mg by mouth daily as needed.      . SUMAtriptan (IMITREX) 100 MG tablet Take 100 mg by mouth as needed for migraine.       Marland Kitchen tiZANidine (ZANAFLEX) 4 MG tablet Take 4 mg by mouth at bedtime. FOR SPASMS       No current facility-administered medications for this visit.    Allergies as of 10/11/2013 - Review Complete 10/11/2013  Allergen Reaction Noted  . Bee venom Shortness Of Breath, Swelling, and Rash 02/07/2011  . Cephalexin Anaphylaxis 01/12/2011  . Penicillins Hives 01/12/2011  . Adhesive [tape] Rash 04/14/2013  . Latex Rash 04/14/2013    Family History  Problem Relation Age of Onset  . Anesthesia  problems Neg Hx   . Hypotension Neg Hx   . Malignant hyperthermia Neg Hx   . Pseudochol deficiency Neg Hx   . Colon cancer Neg Hx     History   Social History  . Marital Status: Married    Spouse Name: N/A    Number of Children: N/A  . Years of Education: N/A   Occupational History  . seamstress(self employed)    Social History Main Topics  . Smoking status: Never Smoker   . Smokeless tobacco: Not on file  . Alcohol Use: Yes     Comment: once a month  . Drug Use: No  . Sexual Activity: Yes    Birth Control/ Protection: None   Other Topics Concern  . Not on file   Social History Narrative  . No narrative on file    Review of Systems: As mentioned in HPI  Physical Exam: BP 134/84  Temp(Src) 98.4 F (36.9 C) (Oral)  Resp 18  Ht 5\' 4"  (1.626 m)  Wt 280 lb 9.6 oz (127.279 kg)  BMI 48.14 kg/m2  LMP 05/06/2011 General:   Alert and oriented. Pleasant and cooperative. Well-nourished and well-developed.  Head:  Normocephalic and atraumatic. Eyes:  Without icterus, sclera clear and conjunctiva pink.  Ears:  Normal auditory acuity. Nose:  No deformity, discharge,  or lesions. Mouth:  No deformity or lesions, oral mucosa pink.  Lungs:  Clear to auscultation bilaterally. No wheezes, rales, or rhonchi. No distress.  Heart:  S1, S2 present without murmurs appreciated.  Abdomen:  +BS, soft, TTP epigastric region and non-distended. Obese. Unable to appreciate HSM. Rectal:  Deferred  Msk:  Symmetrical without gross deformities. Normal posture. Extremities:  Without clubbing or edema. Neurologic:  Alert and  oriented x4;  grossly normal neurologically. Skin:  Intact without significant lesions or rashes. Psych:  Alert and cooperative. Normal mood and affect.   Outside labs to be abstracted. In HPI.

## 2013-10-13 ENCOUNTER — Telehealth: Payer: Self-pay

## 2013-10-13 NOTE — Telephone Encounter (Signed)
Pt called and left voicemail, called pt back, she was having some abd pain today and the carafate wasn't helping. She took her oxycodone and that helped her a lot and she is not having the pain right now. Advised her to stay away from nsaids, continue medications that RMR had prescribed. Also advised her to stick to clear liquids until her pain subsided. Informed her that if she got worse she should go to ED. Told her that I would let RMR know and if he had any further recommendations that I would call her and that we would be following up on her path report.   Per procedure note, RMR wanted pt to have an ultrasound. Serena Colonel, have you scheduled this?

## 2013-10-14 ENCOUNTER — Other Ambulatory Visit: Payer: Self-pay | Admitting: Internal Medicine

## 2013-10-14 ENCOUNTER — Encounter (HOSPITAL_COMMUNITY): Payer: Self-pay | Admitting: Internal Medicine

## 2013-10-14 DIAGNOSIS — K921 Melena: Secondary | ICD-10-CM

## 2013-10-14 DIAGNOSIS — R110 Nausea: Secondary | ICD-10-CM

## 2013-10-14 DIAGNOSIS — R1013 Epigastric pain: Secondary | ICD-10-CM

## 2013-10-14 NOTE — Telephone Encounter (Signed)
Routing to Julie.  

## 2013-10-14 NOTE — Telephone Encounter (Signed)
Molly Berry is aware of the U/S date & time

## 2013-10-14 NOTE — Telephone Encounter (Signed)
No further recommendations at this time.

## 2013-10-14 NOTE — Telephone Encounter (Signed)
U/S is scheduled for Tues June 16th patient to arrive at [facility] after midnight Monday at 9:45 am

## 2013-10-15 ENCOUNTER — Encounter: Payer: Self-pay | Admitting: Internal Medicine

## 2013-10-16 ENCOUNTER — Encounter: Payer: Self-pay | Admitting: Internal Medicine

## 2013-10-17 ENCOUNTER — Telehealth: Payer: Self-pay

## 2013-10-17 NOTE — Telephone Encounter (Signed)
Letter from: Daneil Dolin  Reason for Letter: Results Review  Send letter to patient.  Send copy of letter with path to referring provider and PCP.  Need to check HP serologies and OV w extender in about 10-11 weeks

## 2013-10-18 ENCOUNTER — Ambulatory Visit (HOSPITAL_COMMUNITY)
Admission: RE | Admit: 2013-10-18 | Discharge: 2013-10-18 | Disposition: A | Discharge status: Home / Self Care | Payer: BC Managed Care – PPO | Source: Ambulatory Visit | Attending: Internal Medicine | Admitting: Internal Medicine

## 2013-10-18 DIAGNOSIS — R1013 Epigastric pain: Secondary | ICD-10-CM

## 2013-10-18 DIAGNOSIS — Z9089 Acquired absence of other organs: Secondary | ICD-10-CM | POA: Insufficient documentation

## 2013-10-18 DIAGNOSIS — R110 Nausea: Secondary | ICD-10-CM

## 2013-10-18 DIAGNOSIS — R109 Unspecified abdominal pain: Secondary | ICD-10-CM | POA: Insufficient documentation

## 2013-10-18 NOTE — Telephone Encounter (Signed)
Results Cc to PCP  

## 2013-10-18 NOTE — Progress Notes (Signed)
cc'd to pcp 

## 2013-10-19 ENCOUNTER — Ambulatory Visit: Payer: BC Managed Care – PPO | Admitting: Gastroenterology

## 2013-10-19 ENCOUNTER — Other Ambulatory Visit: Payer: Self-pay

## 2013-10-19 ENCOUNTER — Other Ambulatory Visit: Payer: Self-pay | Admitting: Internal Medicine

## 2013-10-19 DIAGNOSIS — R1013 Epigastric pain: Secondary | ICD-10-CM

## 2013-10-19 NOTE — Telephone Encounter (Signed)
Letter and lab order mailed to pt. Pt needs ov

## 2013-10-20 ENCOUNTER — Telehealth: Payer: Self-pay | Admitting: Internal Medicine

## 2013-10-20 NOTE — Telephone Encounter (Signed)
Pt is anxious to know what her results are from her U/S and EGD. Please call her at [phone] or [phone]

## 2013-10-20 NOTE — Telephone Encounter (Signed)
Routing to RMR for U/S results.  Letter has already been mailed to pt about path results.

## 2013-10-26 NOTE — Telephone Encounter (Signed)
Pt is aware of OV on 8-24 at 10 with AS

## 2013-10-27 LAB — H. PYLORI ANTIBODY, IGG: H Pylori IgG: <0.4 {ISR}

## 2013-10-31 NOTE — Telephone Encounter (Signed)
Pt is returning a call to [place]. Please advise [phone]

## 2013-11-01 NOTE — Telephone Encounter (Signed)
Spoke with pt. Informed her that hpylori blood work was negative. She wants to know if RMR can tell her what he thinks caused the ulcer. Pt does not take any nsaids. She is still taking the carafate because it helps a lot.   Pt also wanted to know if she needed to pursue the tcs now or wait until she returns in August. She said she is not having any more bloody or dark stools and it is fine with her to wait until August if that is ok.

## 2013-11-01 NOTE — Telephone Encounter (Signed)
Yes, can wait till August for colonoscopy. Make appt with me prior to colonoscopy.   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 She will need to absolutely avoid any type of NSAIDs, like she is doing. Taking BID PPI therapy and carafate is exactly what she needs to be doing. Will need surveillance EGD at time of colonoscopy.

## 2013-11-02 NOTE — Telephone Encounter (Signed)
Pt is aware. Chelsey, please make sure pt is on recall list.

## 2013-11-02 NOTE — Telephone Encounter (Signed)
Noted  

## 2013-11-07 ENCOUNTER — Ambulatory Visit (HOSPITAL_COMMUNITY)
Admission: RE | Admit: 2013-11-07 | Discharge: 2013-11-07 | Disposition: A | Discharge status: Home / Self Care | Payer: BC Managed Care – PPO | Source: Ambulatory Visit | Attending: Family Medicine | Admitting: Family Medicine

## 2013-11-07 ENCOUNTER — Other Ambulatory Visit (HOSPITAL_COMMUNITY): Payer: Self-pay | Admitting: Family Medicine

## 2013-11-07 DIAGNOSIS — R079 Chest pain, unspecified: Secondary | ICD-10-CM

## 2013-11-12 ENCOUNTER — Other Ambulatory Visit: Payer: Self-pay | Admitting: Internal Medicine

## 2013-11-16 ENCOUNTER — Encounter (HOSPITAL_COMMUNITY): Payer: Self-pay | Admitting: Emergency Medicine

## 2013-11-16 ENCOUNTER — Emergency Department (HOSPITAL_COMMUNITY)
Admission: EM | Admit: 2013-11-16 | Discharge: 2013-11-16 | Disposition: A | Discharge status: Home / Self Care | Payer: BC Managed Care – PPO | Attending: Emergency Medicine | Admitting: Emergency Medicine

## 2013-11-16 ENCOUNTER — Emergency Department (HOSPITAL_COMMUNITY): Payer: BC Managed Care – PPO

## 2013-11-16 DIAGNOSIS — F3289 Other specified depressive episodes: Secondary | ICD-10-CM | POA: Insufficient documentation

## 2013-11-16 DIAGNOSIS — R109 Unspecified abdominal pain: Secondary | ICD-10-CM | POA: Insufficient documentation

## 2013-11-16 DIAGNOSIS — K219 Gastro-esophageal reflux disease without esophagitis: Secondary | ICD-10-CM | POA: Insufficient documentation

## 2013-11-16 DIAGNOSIS — E119 Type 2 diabetes mellitus without complications: Secondary | ICD-10-CM | POA: Insufficient documentation

## 2013-11-16 DIAGNOSIS — Z9889 Other specified postprocedural states: Secondary | ICD-10-CM | POA: Insufficient documentation

## 2013-11-16 DIAGNOSIS — F329 Major depressive disorder, single episode, unspecified: Secondary | ICD-10-CM | POA: Insufficient documentation

## 2013-11-16 DIAGNOSIS — Z79899 Other long term (current) drug therapy: Secondary | ICD-10-CM | POA: Insufficient documentation

## 2013-11-16 DIAGNOSIS — Z8542 Personal history of malignant neoplasm of other parts of uterus: Secondary | ICD-10-CM | POA: Insufficient documentation

## 2013-11-16 DIAGNOSIS — J45909 Unspecified asthma, uncomplicated: Secondary | ICD-10-CM | POA: Insufficient documentation

## 2013-11-16 DIAGNOSIS — Z91040 Latex allergy status: Secondary | ICD-10-CM | POA: Insufficient documentation

## 2013-11-16 DIAGNOSIS — Z90710 Acquired absence of both cervix and uterus: Secondary | ICD-10-CM | POA: Insufficient documentation

## 2013-11-16 DIAGNOSIS — Z880 Allergy status to penicillin: Secondary | ICD-10-CM | POA: Insufficient documentation

## 2013-11-16 DIAGNOSIS — Z872 Personal history of diseases of the skin and subcutaneous tissue: Secondary | ICD-10-CM | POA: Insufficient documentation

## 2013-11-16 DIAGNOSIS — Z87442 Personal history of urinary calculi: Secondary | ICD-10-CM | POA: Insufficient documentation

## 2013-11-16 DIAGNOSIS — Z9089 Acquired absence of other organs: Secondary | ICD-10-CM | POA: Insufficient documentation

## 2013-11-16 DIAGNOSIS — Z859 Personal history of malignant neoplasm, unspecified: Secondary | ICD-10-CM | POA: Insufficient documentation

## 2013-11-16 HISTORY — DX: Reserved for concepts with insufficient information to code with codable children: IMO0002

## 2013-11-16 LAB — URINALYSIS, ROUTINE W REFLEX MICROSCOPIC
Bilirubin Urine: NEGATIVE
Glucose, UA: NEGATIVE mg/dL
Ketones, ur: NEGATIVE mg/dL
Leukocytes, UA: NEGATIVE
Nitrite: NEGATIVE
PROTEIN: NEGATIVE mg/dL
Specific Gravity, Urine: 1.01 (ref 1.005–1.030)
Urobilinogen, UA: 0.2 mg/dL (ref 0.0–1.0)
pH: 7 (ref 5.0–8.0)

## 2013-11-16 LAB — CBC WITH DIFFERENTIAL/PLATELET
BASOS ABS: 0 10*3/uL (ref 0.0–0.1)
Basophils Relative: 0 % (ref 0–1)
EOS PCT: 3 % (ref 0–5)
Eosinophils Absolute: 0.2 10*3/uL (ref 0.0–0.7)
HCT: 37.7 % (ref 36.0–46.0)
Hemoglobin: 11.9 g/dL — ABNORMAL LOW (ref 12.0–15.0)
LYMPHS ABS: 1.2 10*3/uL (ref 0.7–4.0)
Lymphocytes Relative: 13 % (ref 12–46)
MCH: 26.6 pg (ref 26.0–34.0)
MCHC: 31.6 g/dL (ref 30.0–36.0)
MCV: 84.2 fL (ref 78.0–100.0)
MONO ABS: 0.6 10*3/uL (ref 0.1–1.0)
Monocytes Relative: 6 % (ref 3–12)
Neutro Abs: 7.3 10*3/uL (ref 1.7–7.7)
Neutrophils Relative %: 78 % — ABNORMAL HIGH (ref 43–77)
Platelets: 451 10*3/uL — ABNORMAL HIGH (ref 150–400)
RBC: 4.48 MIL/uL (ref 3.87–5.11)
RDW: 13.2 % (ref 11.5–15.5)
WBC: 9.4 10*3/uL (ref 4.0–10.5)

## 2013-11-16 LAB — BASIC METABOLIC PANEL
Anion gap: 12 (ref 5–15)
BUN: 16 mg/dL (ref 6–23)
CALCIUM: 9.1 mg/dL (ref 8.4–10.5)
CO2: 28 meq/L (ref 19–32)
Chloride: 100 mEq/L (ref 96–112)
Creatinine, Ser: 0.94 mg/dL (ref 0.50–1.10)
GFR calc Af Amer: 82 mL/min — ABNORMAL LOW (ref >90)
GFR calc non Af Amer: 71 mL/min — ABNORMAL LOW (ref >90)
GLUCOSE: 141 mg/dL — AB (ref 70–99)
Potassium: 4.2 mEq/L (ref 3.7–5.3)
Sodium: 140 mEq/L (ref 137–147)

## 2013-11-16 LAB — URINE MICROSCOPIC-ADD ON

## 2013-11-16 MED ORDER — SODIUM CHLORIDE 0.9 % IV SOLN
Freq: Once | INTRAVENOUS | Status: AC
  Administered 2013-11-16: 07:00:00 via INTRAVENOUS

## 2013-11-16 MED ORDER — ONDANSETRON 4 MG PO TBDP: ORAL_TABLET | ORAL | Status: DC

## 2013-11-16 MED ORDER — CIPROFLOXACIN HCL 500 MG PO TABS: 500.0000 mg | ORAL_TABLET | Freq: Two times a day (BID) | ORAL | Status: DC

## 2013-11-16 MED ORDER — HYDROMORPHONE HCL PF 1 MG/ML IJ SOLN
1.0000 mg | Freq: Once | INTRAMUSCULAR | Status: AC
  Administered 2013-11-16: 1 mg via INTRAVENOUS
  Filled 2013-11-16: qty 1

## 2013-11-16 MED ORDER — ONDANSETRON HCL 4 MG/2ML IJ SOLN
4.0000 mg | Freq: Once | INTRAMUSCULAR | Status: AC
  Administered 2013-11-16: 4 mg via INTRAVENOUS
  Filled 2013-11-16: qty 2

## 2013-11-16 MED ORDER — OXYCODONE-ACETAMINOPHEN 5-325 MG PO TABS: 1.0000 | ORAL_TABLET | Freq: Four times a day (QID) | ORAL | Status: DC | PRN

## 2013-11-16 NOTE — Discharge Instructions (Signed)
Follow up with dr. Roni Bread this week,  Or see one of his partners

## 2013-11-16 NOTE — ED Provider Notes (Signed)
CSN: [id number]     Arrival date & time 11/16/13  3810 History   First MD Initiated Contact with Patient 11/16/13 [phone]     Chief Complaint  Patient presents with  . Flank Pain     (Consider location/radiation/quality/duration/timing/severity/associated sxs/prior Treatment) Patient is a 48 y.o. female presenting with flank pain. The history is provided by the patient.  Flank Pain  She was awakened at 5:30 AM by severe pain in the left flank with radiation to the left lower abdomen. There is associated nausea and dry heaves. She denies fever, chills, sweats. Nothing makes pain better nothing makes it worse. She rates pain at 9/10. Pain is similar to what she has had with kidney stones in the past. She has not taken anything for pain.  Past Medical History  Diagnosis Date  . Depression   . Fibromyalgia   . Diabetes mellitus   . PONV (postoperative nausea and vomiting)   . Asthma   . Cancer   . Uterine cancer   . Thyroid disease   . Hypothyroidism   . Multiple thyroid nodules   . Kidney stones   . GERD (gastroesophageal reflux disease)   . Ulcer    Past Surgical History  Procedure Laterality Date  . Gastric bypass  2013    [facility]  . Cholecystectomy    . Dilation and curettage of uterus  12 yrs ago  . Hysteroscopy w/d&c  06/18/2011    Procedure: DILATATION AND CURETTAGE /HYSTEROSCOPY;  Surgeon: Florian Buff, MD;  Location: [facility];  Service: Gynecology;  Laterality: N/A;  . Dilation and curettage of uterus  06/18/2011    Procedure: DILATATION AND CURETTAGE;  Surgeon: Florian Buff, MD;  Location: [facility];  Service: Gynecology;  Laterality: N/A;  Suction Dilation and Curettage  . Abdominal hysterectomy    . Esophagogastroduodenoscopy N/A 10/12/2013    Procedure: ESOPHAGOGASTRODUODENOSCOPY (EGD);  Surgeon: Daneil Dolin, MD;  Location: [facility];  Service: Endoscopy;  Laterality: N/A;  2:00   Family History  Problem Relation Age of Onset  . Anesthesia problems Neg Hx   .  Hypotension Neg Hx   . Malignant hyperthermia Neg Hx   . Pseudochol deficiency Neg Hx   . Colon cancer Neg Hx   . Pneumonia Mother    History  Substance Use Topics  . Smoking status: Never Smoker   . Smokeless tobacco: Not on file  . Alcohol Use: Yes     Comment: once a month   OB History   Grav Para Term Preterm Abortions TAB SAB Ect Mult Living                 Review of Systems  Genitourinary: Positive for flank pain.  All other systems reviewed and are negative.     Allergies  Bee venom; Cephalexin; Penicillins; Adhesive; and Latex  Home Medications   Prior to Admission medications   Medication Sig Start Date End Date Taking? Authorizing Provider  albuterol (PROVENTIL HFA;VENTOLIN HFA) 108 (90 BASE) MCG/ACT inhaler Inhale 2 puffs into the lungs every 6 (six) hours as needed. For asthma    Yes Historical Provider, MD  ALPRAZolam Duanne Moron) 1 MG tablet Take 1 mg by mouth 3 (three) times daily as needed. FOR ANXIETY AND SLEEP   Yes Historical Provider, MD  CARAFATE 1 GM/10ML suspension TAKE 2 TEASPOONFULS (10ML) BY MOUTH BEFORE MEALS AND AT BEDTIME.   Yes Orvil Feil, NP  Cholecalciferol (VITAMIN D PO) Take 1 tablet by mouth  2 (two) times daily.    Yes Historical Provider, MD  DULoxetine (CYMBALTA) 60 MG capsule Take 120 mg by mouth every morning.    Yes Historical Provider, MD  HYDROcodone-acetaminophen (NORCO) 10-325 MG per tablet Take 1 tablet by mouth every 6 (six) hours as needed for moderate pain.   Yes Historical Provider, MD  lamoTRIgine (LAMICTAL) 100 MG tablet Take 100 mg by mouth 2 (two) times daily.    Yes Historical Provider, MD  Melatonin 10 MG TABS Take 1 tablet by mouth at bedtime.    Yes Historical Provider, MD  Multiple Vitamins-Minerals (MULTIVITAMIN WITH MINERALS) tablet Take 1 tablet by mouth every morning.    Yes Historical Provider, MD  omeprazole (PRILOSEC) 20 MG capsule Take 20 mg by mouth daily as needed. 01/10/13  Yes Historical Provider, MD  oxyCODONE  (ROXICODONE) 15 MG immediate release tablet Take 15 mg by mouth every 6 (six) hours as needed for pain.   Yes Historical Provider, MD  pantoprazole (PROTONIX) 40 MG tablet Take 1 tablet (40 mg total) by mouth daily. Take 30 minutes before breakfast. 10/11/13  Yes Orvil Feil, NP  SUMAtriptan (IMITREX) 100 MG tablet Take 100 mg by mouth as needed for migraine.  02/18/13  Yes Historical Provider, MD  tiZANidine (ZANAFLEX) 4 MG tablet Take 4 mg by mouth at bedtime. FOR SPASMS   Yes Historical Provider, MD   Pulse 70  Temp(Src) 98.4 F (36.9 C) (Oral)  Resp 20  Ht 5' 4.5" (1.638 m)  Wt 275 lb (124.739 kg)  BMI 46.49 kg/m2  SpO2 97%  LMP 05/06/2011 Physical Exam  Nursing note and vitals reviewed.  48 year old female, who appears uncomfortable, but is in no acute distress. Vital signs are normal. Oxygen saturation is 97%, which is normal. Head is normocephalic and atraumatic. PERRLA, EOMI. Oropharynx is clear. Neck is nontender and supple without adenopathy or JVD. Back is nontender in the midline. There is moderate left CVA tenderness. Lungs are clear without rales, wheezes, or rhonchi. Chest is nontender. Heart has regular rate and rhythm without murmur. Abdomen is soft, flat, with mild tenderness in the left lower abdomen. There is no rebound or guarding. There are no masses or hepatosplenomegaly and peristalsis is hypoactive. Extremities have no cyanosis or edema, full range of motion is present. Skin is warm and dry without rash. Neurologic: Mental status is normal, cranial nerves are intact, there are no motor or sensory deficits.   ED Course  Procedures (including critical care time)  MDM   Final diagnoses:  Left flank pain    Flank pain consistent with ureteral colic. Old records are reviewed and she has been seen for renal colic in the past. Last CT scan was a little bit over one year ago did show numerous residual calculi. She will be sent for CT to evaluate her urinary  collecting system. She's given IV fluids, IV hydromorphone, and IV ondansetron. Case is signed out to Dr. Roderic Palau.    Delora Fuel, MD 19/16/60 [id number]

## 2013-11-16 NOTE — ED Notes (Signed)
Pt reports left flank pain that started this morning. Pt has had N/V.

## 2013-11-16 NOTE — ED Notes (Signed)
Patients husband called stating patient was not getting any relief from the prescription written by Dr. Roderic Palau. Spoke with Roderic Palau informed family that patient could take 2 pills every six hours instead of 1 pill. If no relief than to take patient to [place].

## 2013-11-17 ENCOUNTER — Ambulatory Visit (HOSPITAL_COMMUNITY): Payer: BC Managed Care – PPO

## 2013-11-17 ENCOUNTER — Encounter (HOSPITAL_COMMUNITY): Payer: BC Managed Care – PPO | Admitting: Anesthesiology

## 2013-11-17 ENCOUNTER — Ambulatory Visit (HOSPITAL_COMMUNITY)
Admission: RE | Admit: 2013-11-17 | Discharge: 2013-11-17 | Disposition: A | Discharge status: Home / Self Care | Payer: BC Managed Care – PPO | Source: Ambulatory Visit | Attending: Urology | Admitting: Urology

## 2013-11-17 ENCOUNTER — Telehealth: Payer: Self-pay | Admitting: Emergency Medical Services

## 2013-11-17 ENCOUNTER — Ambulatory Visit (HOSPITAL_COMMUNITY): Payer: BC Managed Care – PPO | Admitting: Anesthesiology

## 2013-11-17 ENCOUNTER — Encounter (HOSPITAL_COMMUNITY): Payer: Self-pay | Admitting: *Deleted

## 2013-11-17 ENCOUNTER — Encounter (HOSPITAL_COMMUNITY)
Admission: RE | Disposition: A | Discharge status: Home / Self Care | Payer: Self-pay | Source: Ambulatory Visit | Attending: Urology

## 2013-11-17 ENCOUNTER — Other Ambulatory Visit: Payer: Self-pay | Admitting: Urology

## 2013-11-17 DIAGNOSIS — Z9884 Bariatric surgery status: Secondary | ICD-10-CM | POA: Diagnosis not present

## 2013-11-17 DIAGNOSIS — Z8542 Personal history of malignant neoplasm of other parts of uterus: Secondary | ICD-10-CM | POA: Diagnosis not present

## 2013-11-17 DIAGNOSIS — F3289 Other specified depressive episodes: Secondary | ICD-10-CM | POA: Insufficient documentation

## 2013-11-17 DIAGNOSIS — Z880 Allergy status to penicillin: Secondary | ICD-10-CM | POA: Diagnosis not present

## 2013-11-17 DIAGNOSIS — K219 Gastro-esophageal reflux disease without esophagitis: Secondary | ICD-10-CM | POA: Diagnosis not present

## 2013-11-17 DIAGNOSIS — E119 Type 2 diabetes mellitus without complications: Secondary | ICD-10-CM | POA: Diagnosis not present

## 2013-11-17 DIAGNOSIS — N200 Calculus of kidney: Secondary | ICD-10-CM | POA: Insufficient documentation

## 2013-11-17 DIAGNOSIS — N201 Calculus of ureter: Secondary | ICD-10-CM | POA: Diagnosis present

## 2013-11-17 DIAGNOSIS — F411 Generalized anxiety disorder: Secondary | ICD-10-CM | POA: Insufficient documentation

## 2013-11-17 DIAGNOSIS — J45909 Unspecified asthma, uncomplicated: Secondary | ICD-10-CM | POA: Diagnosis not present

## 2013-11-17 DIAGNOSIS — Z79899 Other long term (current) drug therapy: Secondary | ICD-10-CM | POA: Diagnosis not present

## 2013-11-17 DIAGNOSIS — F329 Major depressive disorder, single episode, unspecified: Secondary | ICD-10-CM | POA: Diagnosis not present

## 2013-11-17 DIAGNOSIS — IMO0001 Reserved for inherently not codable concepts without codable children: Secondary | ICD-10-CM | POA: Insufficient documentation

## 2013-11-17 DIAGNOSIS — Z6841 Body Mass Index (BMI) 40.0 and over, adult: Secondary | ICD-10-CM | POA: Insufficient documentation

## 2013-11-17 DIAGNOSIS — Z881 Allergy status to other antibiotic agents status: Secondary | ICD-10-CM | POA: Diagnosis not present

## 2013-11-17 SURGERY — LITHOTRIPSY, ESWL
Anesthesia: Monitor Anesthesia Care

## 2013-11-17 MED ORDER — DIPHENHYDRAMINE HCL 25 MG PO CAPS
25.0000 mg | ORAL_CAPSULE | ORAL | Status: DC
  Filled 2013-11-17: qty 1

## 2013-11-17 MED ORDER — FENTANYL CITRATE 0.05 MG/ML IJ SOLN: 25.0000 ug | INTRAMUSCULAR | Status: DC | PRN

## 2013-11-17 MED ORDER — ONDANSETRON HCL 4 MG/2ML IJ SOLN
INTRAMUSCULAR | Status: DC | PRN
  Administered 2013-11-17: 4 mg via INTRAVENOUS

## 2013-11-17 MED ORDER — FENTANYL CITRATE 0.05 MG/ML IJ SOLN
INTRAMUSCULAR | Status: AC
  Filled 2013-11-17: qty 2

## 2013-11-17 MED ORDER — OXYCODONE HCL 5 MG PO TABS: 5.0000 mg | ORAL_TABLET | ORAL | Status: DC | PRN

## 2013-11-17 MED ORDER — DIAZEPAM 5 MG PO TABS
10.0000 mg | ORAL_TABLET | ORAL | Status: DC
  Filled 2013-11-17: qty 2

## 2013-11-17 MED ORDER — SODIUM CHLORIDE 0.9 % IJ SOLN: 3.0000 mL | INTRAMUSCULAR | Status: DC | PRN

## 2013-11-17 MED ORDER — MIDAZOLAM HCL 2 MG/2ML IJ SOLN
INTRAMUSCULAR | Status: DC | PRN
  Administered 2013-11-17 (×2): 1 mg via INTRAVENOUS

## 2013-11-17 MED ORDER — MIDAZOLAM HCL 2 MG/2ML IJ SOLN
INTRAMUSCULAR | Status: AC
  Filled 2013-11-17: qty 2

## 2013-11-17 MED ORDER — CIPROFLOXACIN IN D5W 400 MG/200ML IV SOLN
400.0000 mg | INTRAVENOUS | Status: AC
  Administered 2013-11-17: 400 mg via INTRAVENOUS
  Filled 2013-11-17: qty 200

## 2013-11-17 MED ORDER — HYDRALAZINE HCL 20 MG/ML IJ SOLN
INTRAMUSCULAR | Status: DC | PRN
  Administered 2013-11-17 (×3): 5 mg via INTRAVENOUS

## 2013-11-17 MED ORDER — ACETAMINOPHEN 325 MG PO TABS: 650.0000 mg | ORAL_TABLET | ORAL | Status: DC | PRN

## 2013-11-17 MED ORDER — PROMETHAZINE HCL 25 MG/ML IJ SOLN
12.5000 mg | Freq: Four times a day (QID) | INTRAMUSCULAR | Status: DC | PRN
  Administered 2013-11-17: 12.5 mg via INTRAVENOUS
  Filled 2013-11-17: qty 1

## 2013-11-17 MED ORDER — LACTATED RINGERS IV SOLN
INTRAVENOUS | Status: DC | PRN
  Administered 2013-11-17: 15:00:00 via INTRAVENOUS

## 2013-11-17 MED ORDER — ACETAMINOPHEN 650 MG RE SUPP
650.0000 mg | RECTAL | Status: DC | PRN
  Filled 2013-11-17: qty 1

## 2013-11-17 MED ORDER — DEXTROSE-NACL 5-0.45 % IV SOLN
INTRAVENOUS | Status: DC
  Administered 2013-11-17: 13:00:00 via INTRAVENOUS

## 2013-11-17 MED ORDER — PROPOFOL 10 MG/ML IV BOLUS
INTRAVENOUS | Status: DC | PRN
  Administered 2013-11-17 (×8): 50 mg via INTRAVENOUS

## 2013-11-17 MED ORDER — PROPOFOL 10 MG/ML IV BOLUS
INTRAVENOUS | Status: AC
  Filled 2013-11-17: qty 20

## 2013-11-17 MED ORDER — LABETALOL HCL 5 MG/ML IV SOLN
INTRAVENOUS | Status: DC | PRN
  Administered 2013-11-17: 5 mg via INTRAVENOUS

## 2013-11-17 MED ORDER — SODIUM CHLORIDE 0.9 % IJ SOLN: 3.0000 mL | Freq: Two times a day (BID) | INTRAMUSCULAR | Status: DC

## 2013-11-17 MED ORDER — HYDROMORPHONE HCL PF 1 MG/ML IJ SOLN
1.0000 mg | Freq: Once | INTRAMUSCULAR | Status: AC
  Administered 2013-11-17: 1 mg via INTRAVENOUS
  Filled 2013-11-17: qty 1

## 2013-11-17 MED ORDER — SODIUM CHLORIDE 0.9 % IV SOLN: 250.0000 mL | INTRAVENOUS | Status: DC | PRN

## 2013-11-17 MED ORDER — FENTANYL CITRATE 0.05 MG/ML IJ SOLN
INTRAMUSCULAR | Status: DC | PRN
  Administered 2013-11-17: 50 ug via INTRAVENOUS
  Administered 2013-11-17 (×3): 25 ug via INTRAVENOUS
  Administered 2013-11-17: 50 ug via INTRAVENOUS
  Administered 2013-11-17: 25 ug via INTRAVENOUS

## 2013-11-17 MED ORDER — ONDANSETRON HCL 4 MG/2ML IJ SOLN
INTRAMUSCULAR | Status: AC
  Filled 2013-11-17: qty 2

## 2013-11-17 MED ORDER — PROPOFOL 10 MG/ML IV BOLUS
INTRAVENOUS | Status: AC
Start: 2013-11-17 — End: 2013-11-17
  Filled 2013-11-17: qty 20

## 2013-11-17 NOTE — Anesthesia Preprocedure Evaluation (Addendum)
Anesthesia Evaluation  Patient identified by MRN, date of birth, ID band Patient awake    Reviewed: Allergy & Precautions, H&P , NPO status , Patient's Chart, lab work & pertinent test results  History of Anesthesia Complications (+) PONV  Airway Mallampati: II TM Distance: >3 FB Neck ROM: full    Dental no notable dental hx. (+) Teeth Intact, Dental Advisory Given   Pulmonary shortness of breath and with exertion, asthma ,  breath sounds clear to auscultation  Pulmonary exam normal       Cardiovascular Exercise Tolerance: Good negative cardio ROS  Rhythm:regular Rate:Normal     Neuro/Psych negative neurological ROS  negative psych ROS   GI/Hepatic negative GI ROS, Neg liver ROS,   Endo/Other  diabetes, Well Controlled, Type 2Hypothyroidism Morbid obesity  Renal/GU negative Renal ROS  negative genitourinary   Musculoskeletal  (+) Fibromyalgia -  Abdominal (+) + obese,   Peds  Hematology negative hematology ROS (+)   Anesthesia Other Findings   Reproductive/Obstetrics negative OB ROS                          Anesthesia Physical Anesthesia Plan  ASA: III  Anesthesia Plan: MAC   Post-op Pain Management:    Induction:   Airway Management Planned: Simple Face Mask  Additional Equipment:   Intra-op Plan:   Post-operative Plan:   Informed Consent: I have reviewed the patients History and Physical, chart, labs and discussed the procedure including the risks, benefits and alternatives for the proposed anesthesia with the patient or authorized representative who has indicated his/her understanding and acceptance.   Dental Advisory Given  Plan Discussed with: CRNA and Surgeon  Anesthesia Plan Comments:        Anesthesia Quick Evaluation

## 2013-11-17 NOTE — Anesthesia Postprocedure Evaluation (Signed)
  Anesthesia Post-op Note  Patient: Molly Berry  Procedure(s) Performed: Procedure(s) (LRB): EXTRACORPOREAL SHOCK WAVE LITHOTRIPSY (ESWL) (N/A)  Patient Location: PACU  Anesthesia Type: MAC  Level of Consciousness: awake and alert   Airway and Oxygen Therapy: Patient Spontanous Breathing  Post-op Pain: mild  Post-op Assessment: Post-op Vital signs reviewed, Patient's Cardiovascular Status Stable, Respiratory Function Stable, Patent Airway and No signs of Nausea or vomiting  Last Vitals:  Filed Vitals:   11/17/13 1636  BP: 135/61  Pulse: 80  Temp: 36.5 C  Resp: 18    Post-op Vital Signs: stable   Complications: No apparent anesthesia complications

## 2013-11-17 NOTE — Telephone Encounter (Signed)
Pt husband called with post-op pain 3 hrs after her ESWL for left 26mm UPJ stone. She has chronic pain and takes 15mg  oxycodone at baseline. She has a bleeding ulcer and can't take NSAIDs. She took 30mg  oxycodone 1 hr ago without relief. She is without hematuria or fevers. Differential includes residual ureteral obstruction, renal hematoma, or acute on chronic pain. Recommended that if her pain persists in an hour or two, to proceed to [facility] ER for renal colic CT and evaluation. Pts Husband endorsed understanding.

## 2013-11-17 NOTE — Transfer of Care (Signed)
Immediate Anesthesia Transfer of Care Note  Patient: Molly Berry  Procedure(s) Performed: Procedure(s) (LRB): EXTRACORPOREAL SHOCK WAVE LITHOTRIPSY (ESWL) (N/A)  Patient Location: PACU  Anesthesia Type: MAC  Level of Consciousness: sedated, patient cooperative and responds to stimulation  Airway & Oxygen Therapy: Patient Spontanous Breathing and Patient connected to face mask oxgen  Post-op Assessment: Report given to PACU RN and Post -op Vital signs reviewed and stable  Post vital signs: Reviewed and stable  Complications: No apparent anesthesia complications

## 2013-11-17 NOTE — Discharge Instructions (Signed)
Lithotripsy, Care After °Refer to this sheet in the next few weeks. These instructions provide you with information on caring for yourself after your procedure. Your health care provider may also give you more specific instructions. Your treatment has been planned according to current medical practices, but problems sometimes occur. Call your health care provider if you have any problems or questions after your procedure. °WHAT TO EXPECT AFTER THE PROCEDURE  °· Your urine may have a red tinge for a few days after treatment. Blood loss is usually minimal. °· You may have soreness in the back or flank area. This usually goes away after a few days. The procedure can cause blotches or bruises on the back where the pressure wave enters the skin. These marks usually cause only minimal discomfort and should disappear in a short time. °· Stone fragments should begin to pass within 24 hours of treatment. However, a delayed passage is not unusual. °· You may have pain, discomfort, and feel sick to your stomach (nauseated) when the crushed fragments of stone are passed down the tube from the kidney to the bladder. Stone fragments can pass soon after the procedure and may last for up to 4-8 weeks. °· A small number of patients may have severe pain when stone fragments are not able to pass, which leads to an obstruction. °· If your stone is greater than 1 inch (2.5 cm) in diameter or if you have multiple stones that have a combined diameter greater than 1 inch (2.5 cm), you may require more than one treatment. °· If you had a stent placed prior to your procedure, you may experience some discomfort, especially during urination. You may experience the pain or discomfort in your flank or back, or you may experience a sharp pain or discomfort at the base of your penis or in your lower abdomen. The discomfort usually lasts only a few minutes after urinating. °HOME CARE INSTRUCTIONS  °· Rest at home until you feel your energy  improving. °· Only take over-the-counter or prescription medicines for pain, discomfort, or fever as directed by your health care provider. Depending on the type of lithotripsy, you may need to take antibiotics and anti-inflammatory medicines for a few days. °· Drink enough water and fluids to keep your urine clear or pale yellow. This helps "flush" your kidneys. It helps pass any remaining pieces of stone and prevents stones from coming back. °· Most people can resume daily activities within 1-2 days after standard lithotripsy. It can take longer to recover from laser and percutaneous lithotripsy. °· If the stones are in your urinary system, you may be asked to strain your urine at home to look for stones. Any stones that are found can be sent to a medical lab for examination. °· Visit your health care provider for a follow-up appointment in a few weeks. Your doctor may remove your stent if you have one. Your health care provider will also check to see whether stone particles still remain. °SEEK MEDICAL CARE IF:  °· Your pain is not relieved by medicine. °· You have a lasting nauseous feeling. °· You feel there is too much blood in the urine. °· You develop persistent problems with frequent or painful urination that does not at least partially improve after 2 days following the procedure. °· You have a congested cough. °· You feel lightheaded. °· You develop a rash or any other signs that might suggest an allergic problem. °· You develop any reaction or side effects to   your medicine(s). SEEK IMMEDIATE MEDICAL CARE IF:   You experience severe back or flank pain or both.  You see nothing but blood when you urinate.  You cannot pass any urine at all.  You have a fever or shaking chills.  You develop shortness of breath, difficulty breathing, or chest pain.  You develop vomiting that will not stop after 6-8 hours.  You have a fainting episode. Document Released: 05/11/2007 Document Revised: 02/09/2013  Document Reviewed: 11/04/2012 [facility] Patient Information 2015 [place], [place]. This information is not intended to replace advice given to you by your health care provider. Make sure you discuss any questions you have with your health care provider. Lithotripsy for Kidney Stones Lithotripsy is a treatment that can sometimes help eliminate kidney stones and pain that they cause. A form of lithotripsy, also known as extracorporeal shock wave lithotripsy, is a nonsurgical procedure that helps your body rid itself of the kidney stone when it is too big to pass on its own. Extracorporeal shock wave lithotripsy is a method of crushing a kidney stone with shock waves. These shock waves pass through your body and are focused on your stone. They cause the kidney stones to crumble while still in the urinary tract. It is then easier for the smaller pieces of stone to pass in the urine. Lithotripsy usually takes about an hour. It is done in a hospital, a lithotripsy center, or a mobile unit. It usually does not require an overnight stay. Your health care provider will instruct you on preparation for the procedure. Your health care provider will tell you what to expect afterward. LET [facility] CARE PROVIDER KNOW ABOUT:  Any allergies you have.  All medicines you are taking, including vitamins, herbs, eye drops, creams, and over-the-counter medicines.  Previous problems you or members of your family have had with the use of anesthetics.  Any blood disorders you have.  Previous surgeries you have had.  Medical conditions you have. RISKS AND COMPLICATIONS Generally, lithotripsy for kidney stones is a safe procedure. However, as with any procedure, complications can occur. Possible complications include:  Infection.  Bleeding of the kidney.  Bruising of the kidney or skin.  Obstruction of the ureter.  Failure of the stone to fragment. BEFORE THE PROCEDURE  Do not eat or drink for 6-8 hours prior  to the procedure. You may, however, take the medications with a sip of water that your physician instructs you to take  Do not take aspirin or aspirin-containing products for 7 days prior to your procedure  Do not take nonsteroidal anti-inflammatory products for 7 days prior to your procedure PROCEDURE A stent (flexible tube with holes) may be placed in your ureter. The ureter is the tube that transports the urine from the kidneys to the bladder. Your health care provider may place a stent before the procedure. This will help keep urine flowing from the kidney if the fragments of the stone block the ureter. You may have an IV tube placed in one of your veins to give you fluids and medicines. These medicines may help you relax or make you sleep. During the procedure, you will lie comfortably on a fluid-filled cushion or in a warm-water bath. After an X-ray or ultrasound exam to locate your stone, shock waves are aimed at the stone. If you are awake, you may feel a tapping sensation as the shock waves pass through your body. If large stone particles remain after treatment, a second procedure may be necessary at  a later date. For comfort during the test:  Relax as much as possible.  Try to remain still as much as possible.  Try to follow instructions to speed up the test.  Let your health care provider know if you are uncomfortable, anxious, or in pain. AFTER THE PROCEDURE  After surgery, you will be taken to the recovery area. A nurse will watch and check your progress. Once you're awake, stable, and taking fluids well, you will be allowed to go home as long as there are no problems. You will also be allowed to pass your urine before discharge.You may be given antibiotics to help prevent infection. You may also be prescribed pain medicine if needed. In a week or two, your health care provider may remove your stent, if you have one. You may first have an X-ray exam to check on how successful the  fragmentation of your stone has been and how much of the stone has passed. Your health care provider will check to see whether or not stone particles remain. SEEK IMMEDIATE MEDICAL CARE IF:  You develop a fever or shaking chills.  Your pain is not relieved by medicine.  You feel sick to your stomach (nauseated) and you vomit.  You develop heavy bleeding.  You have difficulty urinating.  You start to pass your stent from your penis. Document Released: 04/18/2000 Document Revised: 02/09/2013 Document Reviewed: 11/04/2012 [facility] Patient Information 2015 [place], [place]. This information is not intended to replace advice given to you by your health care provider. Make sure you discuss any questions you have with your health care provider.

## 2013-11-17 NOTE — H&P (Signed)
Active Problems Problems  1. Bilateral kidney stones (592.0) 2. Chronic cystitis (595.2) 3. Lump or mass in breast (611.72) 4. Solitary pulmonary nodule (793.11)  History of Present Illness Molly Berry returns today in f/u. she has a history or stones and recurrent UTI's. She was on TMP for suppression but that was stopped in February at [facility]. She was having some issues with her lungs and kidneys and a diagnosis hasn't been clear.  She has had a couple of UTI's since she stopped the med. She is back in today with the onset yesterday of severe left flank pain with nausea and vomiting and she went to the ER. She was found to have a 6 mm ureteral stone. She still has some pain but it has improved. She had her oxycodone doubled which has helped.  She had some incontinence last night.   Past Medical History Problems  1. History of Anxiety (300.00) 2. History of Arthritis (V13.4) 3. History of Asthma (493.90) 4. History of Fibromyalgia (729.1) 5. History of Gastric ulcer (531.90) 6. History of Gout (274.9) 7. History of depression (V11.8) 8. History of diabetes mellitus (V12.29) 9. History of esophageal reflux (V12.79) 10. History of hypercholesterolemia (V12.29) 11. History of hypertension (V12.59) 12. History of hypothyroidism (V12.29) 13. History of kidney stones (V13.01) 14. History of Urinary Tract Infection (V13.02) 15. History of Uterine Cancer (V10.42)  Surgical History Problems  1. History of Biopsy Thyroid Using Percutaneous Core Needle 2. History of Cholecystectomy 3. History of Dilation And Curettage 4. History of Dilation And Curettage 5. History of Gastric Surgery Roux-en-Y 6. History of Salpingo-oophorectomy Bilat Laparosc Removal Of Both Ovaries & Tubes 7. History of Vaginal Hysterectomy  Current Meds 1. ALPRAZolam 1 MG Oral Tablet;  Therapy: (Recorded:01May2013) to Recorded 2. Carafate 1 GM/10ML Oral Suspension;  Therapy: (Recorded:16Jul2015) to  Recorded 3. Cipro 500 MG Oral Tablet;  Therapy: (Recorded:16Jul2015) to Recorded 4. Cymbalta 60 MG Oral Capsule Delayed Release Particles;  Therapy: (Recorded:01May2013) to Recorded 5. Fish Oil CAPS;  Therapy: (Recorded:01May2013) to Recorded 6. Hydrocodone-Acetaminophen TABS;  Therapy: (Recorded:16Jul2015) to Recorded 7. Imitrex TABS;  Therapy: (Recorded:16Jul2015) to Recorded 8. LamoTRIgine 100 MG Oral Tablet;  Therapy: (Recorded:01May2013) to Recorded 9. Melatonin CAPS;  Therapy: (Recorded:01May2013) to Recorded 10. Multi-Vitamin TABS;   Therapy: (Recorded:01May2013) to Recorded 11. OxyCODONE HCl - 15 MG Oral Tablet;   Therapy: 23Jan2013 to Recorded 12. Protonix 20 MG Oral Tablet Delayed Release;   Therapy: (Recorded:16Jul2015) to Recorded 13. TiZANidine HCl - 4 MG Oral Tablet;   Therapy: 08Apr2013 to Recorded 14. Vitamin D TABS;   Therapy: (Recorded:16Jul2015) to Recorded  Allergies Medication  1. Keflex TABS 2. Penicillins Non-Medication  3. Latex 4. Tape  Family History Problems  1. Family history of Carcinoma Of The Pancreas : Mother 2. Family history of Death In The Family Mother   age  63 of pneumonia 3. Family history of Family Health Status Number Of Children   3 adopted sons 4. Family history of Nephrolithiasis : Father 5. Family history of Prostate Cancer (B01.75) : Father  Social History Problems  1. Alcohol Use   1-2 a month 2. Denied: History of Caffeine Use 3. Marital History - Currently Married 4. Never A Smoker 5. Denied: History of Tobacco Use (305.1)  Review of Systems  Genitourinary: no hematuria.  Gastrointestinal: nausea, vomiting and flank pain.  Constitutional: no fever.    Vitals Vital Signs [Data Includes: Last 1 Day]  Recorded: [id number] 08:51AM  Weight: 272 lb  BMI Calculated: 48.18  BSA Calculated: 2.2 Blood Pressure: 126 / 85 Temperature: 97 F Heart Rate: 72  Physical Exam Constitutional: Well nourished and well  developed . No acute distress.  Pulmonary: No respiratory distress and normal respiratory rhythm and effort.  Cardiovascular: Heart rate and rhythm are normal . No peripheral edema.  Abdomen: The abdomen is obese. moderate left CVA tenderness.    Results/Data Urine [Data Includes: Last 1 Day]   [id number]  COLOR YELLOW   APPEARANCE CLEAR   SPECIFIC GRAVITY 1.020   pH 5.5   GLUCOSE NEG mg/dL  BILIRUBIN NEG   KETONE NEG mg/dL  BLOOD SMALL   PROTEIN NEG mg/dL  UROBILINOGEN 0.2 mg/dL  NITRITE NEG   LEUKOCYTE ESTERASE NEG   SQUAMOUS EPITHELIAL/HPF MODERATE   WBC NONE SEEN WBC/hpf  RBC 7-10 RBC/hpf  BACTERIA FEW   CRYSTALS NONE SEEN   CASTS NONE SEEN    The following images/tracing/specimen were independently visualized:  CT films reviewed. She has a 6 mm radiopaque stone at the left UPJ and tiny renal stones. KUB today shows that the 39mm stone is unchanged. She has some lumbar degenerative disease. There are no other significant findings.  The following clinical lab reports were reviewed:  UA and ER labs reviewed.    Assessment Assessed  1. Calculus of left ureter (592.1)  She has a 72mm UPJ stone that is symptomatic.   Plan Calculus of left ureter  1. KUB; Status:Resulted - Requires Verification;   Done: [id number] 12:00AM Health Maintenance  2. UA With REFLEX; [Do Not Release]; Status:Resulted - Requires Verification;   Done:  [id number] 08:41AM  I discussed the options including observation, ureteroscopy and ESWL.  She would like to try ESWL if possible and I reviewed the risks of bleeding,infection, injury to the kidney or adjacent structures that can be life threatening, need for secondary procedures and sedation risks.  I would have anesthesia do a MAC since she reports being difficult to sedate.   Discussion/Summary Dr. Elsie Lincoln.

## 2013-11-17 NOTE — Interval H&P Note (Signed)
History and Physical Interval Note:  11/17/2013 3:32 PM  Molly Berry  has presented today for surgery, with the diagnosis of left upj stone  The various methods of treatment have been discussed with the patient and family. After consideration of risks, benefits and other options for treatment, the patient has consented to  Procedure(s): EXTRACORPOREAL SHOCK WAVE LITHOTRIPSY (ESWL) (N/A) as a surgical intervention .  The patient's history has been reviewed, patient examined, no change in status, stable for surgery.  I have reviewed the patient's chart and labs.  Questions were answered to the patient's satisfaction.     Molly Berry

## 2013-11-18 ENCOUNTER — Emergency Department (HOSPITAL_COMMUNITY)
Admission: EM | Admit: 2013-11-18 | Discharge: 2013-11-19 | Disposition: A | Discharge status: Home / Self Care | Payer: BC Managed Care – PPO | Attending: Emergency Medicine | Admitting: Emergency Medicine

## 2013-11-18 ENCOUNTER — Encounter (HOSPITAL_COMMUNITY): Payer: Self-pay | Admitting: Emergency Medicine

## 2013-11-18 ENCOUNTER — Emergency Department (HOSPITAL_COMMUNITY): Payer: BC Managed Care – PPO

## 2013-11-18 DIAGNOSIS — Z880 Allergy status to penicillin: Secondary | ICD-10-CM | POA: Insufficient documentation

## 2013-11-18 DIAGNOSIS — Z859 Personal history of malignant neoplasm, unspecified: Secondary | ICD-10-CM | POA: Insufficient documentation

## 2013-11-18 DIAGNOSIS — J45909 Unspecified asthma, uncomplicated: Secondary | ICD-10-CM | POA: Insufficient documentation

## 2013-11-18 DIAGNOSIS — K219 Gastro-esophageal reflux disease without esophagitis: Secondary | ICD-10-CM | POA: Insufficient documentation

## 2013-11-18 DIAGNOSIS — Z872 Personal history of diseases of the skin and subcutaneous tissue: Secondary | ICD-10-CM | POA: Insufficient documentation

## 2013-11-18 DIAGNOSIS — Z79899 Other long term (current) drug therapy: Secondary | ICD-10-CM | POA: Insufficient documentation

## 2013-11-18 DIAGNOSIS — N200 Calculus of kidney: Secondary | ICD-10-CM

## 2013-11-18 DIAGNOSIS — F329 Major depressive disorder, single episode, unspecified: Secondary | ICD-10-CM | POA: Insufficient documentation

## 2013-11-18 DIAGNOSIS — Z792 Long term (current) use of antibiotics: Secondary | ICD-10-CM | POA: Insufficient documentation

## 2013-11-18 DIAGNOSIS — F3289 Other specified depressive episodes: Secondary | ICD-10-CM | POA: Insufficient documentation

## 2013-11-18 DIAGNOSIS — Z8542 Personal history of malignant neoplasm of other parts of uterus: Secondary | ICD-10-CM | POA: Insufficient documentation

## 2013-11-18 DIAGNOSIS — Z91040 Latex allergy status: Secondary | ICD-10-CM | POA: Insufficient documentation

## 2013-11-18 DIAGNOSIS — E119 Type 2 diabetes mellitus without complications: Secondary | ICD-10-CM | POA: Insufficient documentation

## 2013-11-18 MED ORDER — HYDROMORPHONE HCL PF 1 MG/ML IJ SOLN
1.0000 mg | Freq: Once | INTRAMUSCULAR | Status: AC
  Administered 2013-11-18: 1 mg via INTRAVENOUS
  Filled 2013-11-18: qty 1

## 2013-11-18 MED ORDER — ONDANSETRON HCL 4 MG/2ML IJ SOLN
4.0000 mg | Freq: Once | INTRAMUSCULAR | Status: DC
Start: 2013-11-18 — End: 2013-11-18

## 2013-11-18 MED ORDER — KETOROLAC TROMETHAMINE 30 MG/ML IJ SOLN
30.0000 mg | Freq: Once | INTRAMUSCULAR | Status: AC
  Administered 2013-11-18: 30 mg via INTRAVENOUS
  Filled 2013-11-18: qty 1

## 2013-11-18 MED ORDER — ONDANSETRON HCL 4 MG/2ML IJ SOLN
4.0000 mg | Freq: Once | INTRAMUSCULAR | Status: AC
  Administered 2013-11-18: 4 mg via INTRAVENOUS
  Filled 2013-11-18: qty 2

## 2013-11-18 MED ORDER — KETOROLAC TROMETHAMINE 30 MG/ML IJ SOLN: 30.0000 mg | Freq: Once | INTRAMUSCULAR | Status: DC

## 2013-11-18 MED ORDER — HYDROMORPHONE HCL PF 1 MG/ML IJ SOLN: 1.0000 mg | Freq: Once | INTRAMUSCULAR | Status: DC

## 2013-11-18 NOTE — ED Notes (Signed)
Pt c/o severe L flank pain onset today, Lithotripsy yesterday. pt crying in triage, pt c/o difficulty urinating. Denies hematuria, denies n/v/d.

## 2013-11-18 NOTE — ED Provider Notes (Signed)
CSN: [id number]     Arrival date & time 11/18/13  2249 History   First MD Initiated Contact with Patient 11/18/13 2301     Chief Complaint  Patient presents with  . Flank Pain     (Consider location/radiation/quality/duration/timing/severity/associated sxs/prior Treatment) HPI Comments: Pt is a 48 y/o female - hx of frequent KS's int he past as well as frequent UTI's - had lithotripsy done yesterday for a 58mm stone at the UPJ on the L.  She was doing well last night but at 7 PM tonight - she had acute onset of severe pain in the L flank which is fluctuating, severe at worst and associated with nausea - Zofran taken prior to arrival and a total of 45 mg of Oxycodone with only minimal relief.  Patient is a 48 y.o. female presenting with flank pain. The history is provided by the patient, medical records and the spouse.  Flank Pain    Past Medical History  Diagnosis Date  . Depression   . Fibromyalgia   . Diabetes mellitus   . PONV (postoperative nausea and vomiting)   . Asthma   . Cancer   . Uterine cancer   . Thyroid disease   . Hypothyroidism   . Multiple thyroid nodules   . Kidney stones   . GERD (gastroesophageal reflux disease)   . Ulcer    Past Surgical History  Procedure Laterality Date  . Gastric bypass  2013    [facility]  . Cholecystectomy    . Dilation and curettage of uterus  12 yrs ago  . Hysteroscopy w/d&c  06/18/2011    Procedure: DILATATION AND CURETTAGE /HYSTEROSCOPY;  Surgeon: Florian Buff, MD;  Location: [facility];  Service: Gynecology;  Laterality: N/A;  . Dilation and curettage of uterus  06/18/2011    Procedure: DILATATION AND CURETTAGE;  Surgeon: Florian Buff, MD;  Location: [facility];  Service: Gynecology;  Laterality: N/A;  Suction Dilation and Curettage  . Abdominal hysterectomy    . Esophagogastroduodenoscopy N/A 10/12/2013    Procedure: ESOPHAGOGASTRODUODENOSCOPY (EGD);  Surgeon: Daneil Dolin, MD;  Location: [facility];  Service: Endoscopy;   Laterality: N/A;  2:00   Family History  Problem Relation Age of Onset  . Anesthesia problems Neg Hx   . Hypotension Neg Hx   . Malignant hyperthermia Neg Hx   . Pseudochol deficiency Neg Hx   . Colon cancer Neg Hx   . Pneumonia Mother    History  Substance Use Topics  . Smoking status: Never Smoker   . Smokeless tobacco: Not on file  . Alcohol Use: Yes     Comment: once a month   OB History   Grav Para Term Preterm Abortions TAB SAB Ect Mult Living                 Review of Systems  Genitourinary: Positive for flank pain.  All other systems reviewed and are negative.     Allergies  Bee venom; Cephalexin; Vancomycin; Penicillins; Adhesive; and Latex  Home Medications   Prior to Admission medications   Medication Sig Start Date End Date Taking? Authorizing Provider  albuterol (PROVENTIL HFA;VENTOLIN HFA) 108 (90 BASE) MCG/ACT inhaler Inhale 2 puffs into the lungs every 6 (six) hours as needed. For asthma    Yes Historical Provider, MD  ALPRAZolam Duanne Moron) 1 MG tablet Take 1 mg by mouth 3 (three) times daily as needed. FOR ANXIETY AND SLEEP   Yes Historical Provider, MD  Cholecalciferol (VITAMIN  D PO) Take 1 tablet by mouth 2 (two) times daily.    Yes Historical Provider, MD  ciprofloxacin (CIPRO) 500 MG tablet Take 500 mg by mouth 2 (two) times daily. 11/16/13 11/23/13 Yes Historical Provider, MD  DULoxetine (CYMBALTA) 60 MG capsule Take 120 mg by mouth every morning.    Yes Historical Provider, MD  HYDROcodone-acetaminophen (NORCO) 10-325 MG per tablet Take 1 tablet by mouth every 6 (six) hours as needed for moderate pain.   Yes Historical Provider, MD  lamoTRIgine (LAMICTAL) 100 MG tablet Take 100 mg by mouth 2 (two) times daily.    Yes Historical Provider, MD  Melatonin 10 MG TABS Take 1 tablet by mouth at bedtime.    Yes Historical Provider, MD  Multiple Vitamins-Minerals (MULTIVITAMIN WITH MINERALS) tablet Take 1 tablet by mouth every morning.    Yes Historical Provider,  MD  oxyCODONE (ROXICODONE) 15 MG immediate release tablet Take 15 mg by mouth every 6 (six) hours as needed for pain.   Yes Historical Provider, MD  pantoprazole (PROTONIX) 40 MG tablet Take 40 mg by mouth 2 (two) times daily.   Yes Historical Provider, MD  SUMAtriptan (IMITREX) 100 MG tablet Take 100 mg by mouth as needed for migraine.  02/18/13  Yes Historical Provider, MD  tiZANidine (ZANAFLEX) 4 MG tablet Take 4 mg by mouth at bedtime. For spasms.   Yes Historical Provider, MD   BP 169/110  Pulse 90  Temp(Src) 99.1 F (37.3 C) (Oral)  Resp 20  SpO2 100%  LMP 05/06/2011 Physical Exam  Nursing note and vitals reviewed. Constitutional: She appears well-developed and well-nourished. She appears distressed.  HENT:  Head: Normocephalic and atraumatic.  Mouth/Throat: Oropharynx is clear and moist. No oropharyngeal exudate.  Eyes: Conjunctivae and EOM are normal. Pupils are equal, round, and reactive to light. Right eye exhibits no discharge. Left eye exhibits no discharge. No scleral icterus.  Neck: Normal range of motion. Neck supple. No JVD present. No thyromegaly present.  Cardiovascular: Normal rate, regular rhythm, normal heart sounds and intact distal pulses.  Exam reveals no gallop and no friction rub.   No murmur heard. Pulmonary/Chest: Effort normal and breath sounds normal. No respiratory distress. She has no wheezes. She has no rales.  Abdominal: Soft. Bowel sounds are normal. She exhibits no distension and no mass. There is no tenderness.  L CVA ttp - no abd ttp, soft and obese  Musculoskeletal: Normal range of motion. She exhibits no edema and no tenderness.  Lymphadenopathy:    She has no cervical adenopathy.  Neurological: She is alert. Coordination normal.  Skin: Skin is warm and dry. No rash noted. No erythema.  Psychiatric: She has a normal mood and affect. Her behavior is normal.    ED Course  Procedures (including critical care time) Labs Review Labs Reviewed -  No data to display  Imaging Review Ct Abdomen Pelvis Wo Contrast  11/19/2013   CLINICAL DATA:  Flank pain  EXAM: CT ABDOMEN AND PELVIS WITHOUT CONTRAST  TECHNIQUE: Multidetector CT imaging of the abdomen and pelvis was performed following the standard protocol without IV contrast.  COMPARISON:  None.  FINDINGS: The lung bases are clear.  There is a 3.6 mm left UPJ calculus and a 4.3 cm mid left ureteral calculus, resulting in mild left hydronephrosis and perinephric stranding. There are bilateral nephrolithiasis. The kidneys are symmetric in size without evidence for exophytic mass. The bladder is unremarkable.  The liver demonstrates no focal abnormality. The gallbladder is unremarkable. The  spleen demonstrates no focal abnormality. The adrenal glands and pancreas are normal.  There is evidence of prior gastric bypass surgery with postsurgical changes. The unopacified duodenum, small intestine and large intestine are unremarkable, but evaluation is limited by lack of oral contrast. There is no pneumoperitoneum, pneumatosis, or portal venous gas. There is no abdominal or pelvic free fluid. There is no lymphadenopathy.  The abdominal aorta is normal in caliber .  The osseous structures are unremarkable.  IMPRESSION: 1. There is a 3.6 mm left UPJ calculus and a 4.3 cm mid left ureteral calculus, resulting in mild left hydronephrosis and perinephric stranding. 2. Bilateral nephrolithiasis.   Electronically Signed   By: Kathreen Devoid   On: 11/19/2013 00:11   Dg Abd 1 View  11/17/2013   CLINICAL DATA:  Pre lithotripsy, left-sided kidney stone by history  EXAM: ABDOMEN - 1 VIEW  COMPARISON:  CT abdomen pelvis of 11/16/2013  FINDINGS: The left UPJ calculus noted on CT appears unchanged in position, lying just to the left of the L2-3 interspace. A left upper pole renal calculus is noted. No definite right renal calculus is seen by plain film. Opacities in the right abdomen most likely represent ingested material i.e.  pills, within the bowel. The bowel gas pattern is nonspecific.  IMPRESSION: 1. No apparent change in position of the left UPJ calculus to the left of L2-3. 2. Left renal calculus. No definite right renal calculi are seen by plain film.   Electronically Signed   By: Ivar Drape M.D.   On: 11/17/2013 13:07      MDM   Final diagnoses:  Kidney stone on left side    The pt has ongoing pain - likely related to KS - VS show hypertension but no tachycardia or fever, check CT to eval for renal hematoma / injury - obstruction.  meds ordered for pain control.  Pain better with meds - CT shows that stone is smaller and is passable in size. Pt agreeable to d/c.  Meds given in ED:  Medications  HYDROmorphone (DILAUDID) injection 0.5 mg (not administered)  HYDROmorphone (DILAUDID) injection 1 mg (1 mg Intravenous Given 11/18/13 2330)  ondansetron (ZOFRAN) injection 4 mg (4 mg Intravenous Given 11/18/13 2330)  ketorolac (TORADOL) 30 MG/ML injection 30 mg (30 mg Intravenous Given 11/18/13 2330)    New Prescriptions   No medications on file      Johnna Acosta, MD 11/19/13 0020

## 2013-11-18 NOTE — ED Notes (Signed)
Pt arrived to the Ed with a complaint of flank pain.  Pt had her kidney stones blasted yesterday with positive results.  Pt has taken 45-60 mg of oxycodone since 1900 hrs.  Pt states the pain is located in the right lower flank area.

## 2013-11-19 MED ORDER — HYDROMORPHONE HCL PF 1 MG/ML IJ SOLN
0.5000 mg | Freq: Once | INTRAMUSCULAR | Status: AC
  Administered 2013-11-19: 0.5 mg via INTRAVENOUS
  Filled 2013-11-19: qty 1

## 2013-11-19 MED ORDER — TAMSULOSIN HCL 0.4 MG PO CAPS: 0.4000 mg | ORAL_CAPSULE | Freq: Two times a day (BID) | ORAL | Status: DC

## 2013-12-26 ENCOUNTER — Encounter: Payer: Self-pay | Admitting: Gastroenterology

## 2013-12-26 ENCOUNTER — Ambulatory Visit (INDEPENDENT_AMBULATORY_CARE_PROVIDER_SITE_OTHER): Payer: BC Managed Care – PPO | Admitting: Gastroenterology

## 2013-12-26 ENCOUNTER — Encounter (HOSPITAL_COMMUNITY): Payer: Self-pay | Admitting: Pharmacy Technician

## 2013-12-26 ENCOUNTER — Other Ambulatory Visit: Payer: Self-pay | Admitting: Internal Medicine

## 2013-12-26 VITALS — BP 121/68 | HR 95 | Temp 98.0°F | Ht 64.0 in | Wt 283.0 lb

## 2013-12-26 DIAGNOSIS — K289 Gastrojejunal ulcer, unspecified as acute or chronic, without hemorrhage or perforation: Secondary | ICD-10-CM

## 2013-12-26 DIAGNOSIS — D649 Anemia, unspecified: Secondary | ICD-10-CM

## 2013-12-26 DIAGNOSIS — R195 Other fecal abnormalities: Secondary | ICD-10-CM | POA: Insufficient documentation

## 2013-12-26 MED ORDER — PEG 3350-KCL-NA BICARB-NACL 420 G PO SOLR: 4000.0000 mL | ORAL | Status: DC

## 2013-12-26 NOTE — Progress Notes (Signed)
Referring Provider: Caren Macadam, MD Primary Care Physician:  [name], MD Primary GI: Dr. Gala Romney   Chief Complaint  Patient presents with  . Follow-up    HPI:   Molly Berry presents today with history of gastric bypass, found to have anastomotic ulcer on EGD in June 2015. H.pylori serology negative. Overall abdominal pain improved. No pain with eating. No N/V. Carafate QID, Protonix BID. A lot of gas but no constipation, diarrhea. No hematochezia. No melena. No FH of colon cancer. Heme positive stool noted at last visit. Needs colonoscopy with surveillance EGD now. Microcytic anemia noted. Unknown iron status. Not taking po iron s/p bariatric surgery. States she remembers the procedure.   Past Medical History  Diagnosis Date  . Depression   . Fibromyalgia   . Diabetes mellitus   . PONV (postoperative nausea and vomiting)   . Asthma   . Cancer   . Uterine cancer   . Thyroid disease   . Hypothyroidism   . Multiple thyroid nodules   . Kidney stones   . GERD (gastroesophageal reflux disease)   . Ulcer     Past Surgical History  Procedure Laterality Date  . Gastric bypass  2013    [facility]  . Cholecystectomy    . Dilation and curettage of uterus  12 yrs ago  . Hysteroscopy w/d&c  06/18/2011    Procedure: DILATATION AND CURETTAGE /HYSTEROSCOPY;  Surgeon: Florian Buff, MD;  Location: [facility];  Service: Gynecology;  Laterality: N/A;  . Dilation and curettage of uterus  06/18/2011    Procedure: DILATATION AND CURETTAGE;  Surgeon: Florian Buff, MD;  Location: [facility];  Service: Gynecology;  Laterality: N/A;  Suction Dilation and Curettage  . Abdominal hysterectomy    . Esophagogastroduodenoscopy N/A 10/12/2013    Dr. Rourk:anastomotic ulcer likely cause of bleeding. likely ischemic   . Lithotripsy      Current Outpatient Prescriptions  Medication Sig Dispense Refill  . albuterol (PROVENTIL HFA;VENTOLIN HFA) 108 (90 BASE) MCG/ACT inhaler Inhale 2  puffs into the lungs every 6 (six) hours as needed. For asthma       . ALPRAZolam (XANAX) 1 MG tablet Take 1 mg by mouth 3 (three) times daily as needed. FOR ANXIETY AND SLEEP      . Cholecalciferol (VITAMIN D PO) Take 1 tablet by mouth 2 (two) times daily.       . DULoxetine (CYMBALTA) 60 MG capsule Take 120 mg by mouth every morning.       Marland Kitchen HYDROcodone-acetaminophen (NORCO) 10-325 MG per tablet Take 1 tablet by mouth every 6 (six) hours as needed for moderate pain.      Marland Kitchen lamoTRIgine (LAMICTAL) 100 MG tablet Take 100 mg by mouth 2 (two) times daily.       . Melatonin 10 MG TABS Take 1 tablet by mouth at bedtime.       . Multiple Vitamins-Minerals (MULTIVITAMIN WITH MINERALS) tablet Take 1 tablet by mouth every morning.       Marland Kitchen oxyCODONE (ROXICODONE) 15 MG immediate release tablet Take 15 mg by mouth every 6 (six) hours as needed for pain.      . pantoprazole (PROTONIX) 40 MG tablet Take 40 mg by mouth 2 (two) times daily.      . SUMAtriptan (IMITREX) 100 MG tablet Take 100 mg by mouth as needed for migraine.       . tamsulosin (FLOMAX) 0.4 MG CAPS capsule Take 1 capsule (0.4  mg total) by mouth 2 (two) times daily.  10 capsule  0  . tiZANidine (ZANAFLEX) 4 MG tablet Take 4 mg by mouth at bedtime. For spasms.       No current facility-administered medications for this visit.    Allergies as of 12/26/2013 - Review Complete 12/26/2013  Allergen Reaction Noted  . Bee venom Shortness Of Breath, Swelling, and Rash 02/07/2011  . Cephalexin Anaphylaxis 01/12/2011  . Vancomycin Anaphylaxis and Hives 11/17/2013  . Penicillins Hives 01/12/2011  . Adhesive [tape] Rash 04/14/2013  . Latex Rash 04/14/2013    Family History  Problem Relation Age of Onset  . Anesthesia problems Neg Hx   . Hypotension Neg Hx   . Malignant hyperthermia Neg Hx   . Pseudochol deficiency Neg Hx   . Colon cancer Neg Hx   . Pneumonia Mother     History   Social History  . Marital Status: Married    Spouse Name:  N/A    Number of Children: N/A  . Years of Education: N/A   Occupational History  . seamstress(self employed)    Social History Main Topics  . Smoking status: Never Smoker   . Smokeless tobacco: None  . Alcohol Use: Yes     Comment: once a month  . Drug Use: No  . Sexual Activity: Yes    Birth Control/ Protection: None   Other Topics Concern  . None   Social History Narrative  . None    Review of Systems: Negative as mentioned in HPI  Physical Exam: BP 121/68  Pulse 95  Temp(Src) 98 F (36.7 C) (Oral)  Ht 5\' 4"  (1.626 m)  Wt 283 lb (128.368 kg)  BMI 48.55 kg/m2  LMP 05/06/2011 General:   Alert and oriented. No distress noted. Pleasant and cooperative.  Eyes:  Conjuctiva clear without scleral icterus. Mouth:  Oral mucosa pink and moist. Good dentition. No lesions. Heart:  S1, S2 present without murmurs, rubs, or gallops. Regular rate and rhythm. Abdomen:  +BS, soft, non-tender and non-distended. No rebound or guarding. No HSM or masses noted. Msk:  Symmetrical without gross deformities. Normal posture. Extremities:  Without edema. Neurologic:  Alert and  oriented x4;  grossly normal neurologically. Skin:  Intact without significant lesions or rashes. Psych:  Alert and cooperative. Normal mood and affect.  Lab Results  Component Value Date   WBC 9.4 11/16/2013   HGB 11.9* 11/16/2013   HCT 37.7 11/16/2013   MCV 84.2 11/16/2013   PLT 451* 11/16/2013

## 2013-12-26 NOTE — Patient Instructions (Signed)
We have scheduled you for a colonoscopy and upper endoscopy with Dr. Gala Romney.  Continue to take Protonix twice a day as you are doing.   Please complete blood work. I am checking your iron levels. You will probably need to take an iron supplement, but we will review this first.

## 2013-12-27 ENCOUNTER — Encounter: Payer: Self-pay | Admitting: Gastroenterology

## 2013-12-27 DIAGNOSIS — D509 Iron deficiency anemia, unspecified: Secondary | ICD-10-CM | POA: Insufficient documentation

## 2013-12-27 NOTE — Assessment & Plan Note (Signed)
Normocytic. Unknown iron levels. With history of malabsorptive procedure, check iron and ferritin. Will likely need iron supplementation.

## 2013-12-27 NOTE — Assessment & Plan Note (Signed)
On EGD June 2015. H.pylori serologies negative. As gastric bypass is ulcerogenic in nature, needs to continue avoidance of NSAIDs or any aspirin powders. Continue PPI BID. Needs surveillance EGD.   Proceed with upper endoscopy with PROPOFOL in the near future with Dr. Gala Romney. The risks, benefits, and alternatives have been discussed in detail with patient. They have stated understanding and desire to proceed.

## 2013-12-27 NOTE — Assessment & Plan Note (Signed)
No lower GI symptoms. No overt GI bleeding. No FH of colon cancer. Likely benign anorectal source but needs colonoscopy for further assessment.   Proceed with TCS with Dr. Gala Romney in near future: the risks, benefits, and alternatives have been discussed with the patient in detail. The patient states understanding and desires to proceed. PROPOFOL due to polypharmacy.

## 2013-12-28 NOTE — Progress Notes (Signed)
cc'd to pcp 

## 2014-01-06 ENCOUNTER — Inpatient Hospital Stay (HOSPITAL_COMMUNITY): Admission: RE | Admit: 2014-01-06 | Payer: BC Managed Care – PPO | Source: Ambulatory Visit

## 2014-01-13 ENCOUNTER — Other Ambulatory Visit (HOSPITAL_COMMUNITY): Payer: BC Managed Care – PPO

## 2014-01-16 ENCOUNTER — Encounter (HOSPITAL_COMMUNITY): Payer: Self-pay

## 2014-01-16 ENCOUNTER — Encounter (HOSPITAL_COMMUNITY)
Admission: RE | Admit: 2014-01-16 | Discharge: 2014-01-16 | Disposition: A | Discharge status: Home / Self Care | Payer: BC Managed Care – PPO | Source: Ambulatory Visit | Attending: Internal Medicine | Admitting: Internal Medicine

## 2014-01-16 DIAGNOSIS — Q438 Other specified congenital malformations of intestine: Secondary | ICD-10-CM | POA: Diagnosis not present

## 2014-01-16 DIAGNOSIS — Z9884 Bariatric surgery status: Secondary | ICD-10-CM | POA: Diagnosis not present

## 2014-01-16 DIAGNOSIS — K259 Gastric ulcer, unspecified as acute or chronic, without hemorrhage or perforation: Secondary | ICD-10-CM | POA: Diagnosis present

## 2014-01-16 DIAGNOSIS — IMO0001 Reserved for inherently not codable concepts without codable children: Secondary | ICD-10-CM | POA: Diagnosis not present

## 2014-01-16 DIAGNOSIS — F329 Major depressive disorder, single episode, unspecified: Secondary | ICD-10-CM | POA: Diagnosis not present

## 2014-01-16 DIAGNOSIS — K644 Residual hemorrhoidal skin tags: Secondary | ICD-10-CM | POA: Diagnosis not present

## 2014-01-16 DIAGNOSIS — F3289 Other specified depressive episodes: Secondary | ICD-10-CM | POA: Diagnosis not present

## 2014-01-16 DIAGNOSIS — K921 Melena: Secondary | ICD-10-CM | POA: Diagnosis not present

## 2014-01-16 DIAGNOSIS — Z79899 Other long term (current) drug therapy: Secondary | ICD-10-CM | POA: Diagnosis not present

## 2014-01-16 DIAGNOSIS — E119 Type 2 diabetes mellitus without complications: Secondary | ICD-10-CM | POA: Diagnosis not present

## 2014-01-16 DIAGNOSIS — E039 Hypothyroidism, unspecified: Secondary | ICD-10-CM | POA: Diagnosis not present

## 2014-01-16 DIAGNOSIS — Z09 Encounter for follow-up examination after completed treatment for conditions other than malignant neoplasm: Secondary | ICD-10-CM | POA: Diagnosis not present

## 2014-01-16 DIAGNOSIS — Z90710 Acquired absence of both cervix and uterus: Secondary | ICD-10-CM | POA: Diagnosis not present

## 2014-01-16 LAB — BASIC METABOLIC PANEL
Anion gap: 10 (ref 5–15)
BUN: 12 mg/dL (ref 6–23)
CHLORIDE: 100 meq/L (ref 96–112)
CO2: 33 meq/L — AB (ref 19–32)
Calcium: 8.6 mg/dL (ref 8.4–10.5)
Creatinine, Ser: 0.92 mg/dL (ref 0.50–1.10)
GFR calc Af Amer: 84 mL/min — ABNORMAL LOW (ref >90)
GFR calc non Af Amer: 72 mL/min — ABNORMAL LOW (ref >90)
Glucose, Bld: 121 mg/dL — ABNORMAL HIGH (ref 70–99)
POTASSIUM: 3.7 meq/L (ref 3.7–5.3)
Sodium: 143 mEq/L (ref 137–147)

## 2014-01-16 LAB — HEMOGLOBIN AND HEMATOCRIT, BLOOD
HCT: 32.5 % — ABNORMAL LOW (ref 36.0–46.0)
Hemoglobin: 9.7 g/dL — ABNORMAL LOW (ref 12.0–15.0)

## 2014-01-16 NOTE — Patient Instructions (Signed)
Molly Berry  01/16/2014   Your procedure is scheduled on:   01/19/2014  Report to [facility] at  745  AM.  Call this number if you have problems the morning of surgery: [phone]   Remember:   Do not eat food or drink liquids after midnight.   Take these medicines the morning of surgery with A SIP OF WATER:  Xanax, cymbalta, hydrocodone (or oxycodone), protonix, flomax. Take your proventil inhaler and bring it with you when you come.   Do not wear jewelry, make-up or nail polish.  Do not wear lotions, powders, or perfumes.   Do not shave 48 hours prior to surgery. Men may shave face and neck.  Do not bring valuables to the hospital.  [facility] is not responsible for any belongings or valuables.               Contacts, dentures or bridgework may not be worn into surgery.  Leave suitcase in the car. After surgery it may be brought to your room.  For patients admitted to the hospital, discharge time is determined by your treatment team.               Patients discharged the day of surgery will not be allowed to drive home.  Name and phone number of your driver: family  Special Instructions: N/A   Please read over the following fact sheets that you were given: Pain Booklet, Coughing and Deep Breathing, Surgical Site Infection Prevention, Anesthesia Post-op Instructions and Care and Recovery After Surgery Colonoscopy A colonoscopy is an exam to look at the entire large intestine (colon). This exam can help find problems such as tumors, polyps, inflammation, and areas of bleeding. The exam takes about 1 hour.  LET [facility] CARE PROVIDER KNOW ABOUT:   Any allergies you have.  All medicines you are taking, including vitamins, herbs, eye drops, creams, and over-the-counter medicines.  Previous problems you or members of your family have had with the use of anesthetics.  Any blood disorders you have.  Previous surgeries you have had.  Medical conditions you  have. RISKS AND COMPLICATIONS  Generally, this is a safe procedure. However, as with any procedure, complications can occur. Possible complications include:  Bleeding.  Tearing or rupture of the colon wall.  Reaction to medicines given during the exam.  Infection (rare). BEFORE THE PROCEDURE   Ask your health care provider about changing or stopping your regular medicines.  You may be prescribed an oral bowel prep. This involves drinking a large amount of medicated liquid, starting the day before your procedure. The liquid will cause you to have multiple loose stools until your stool is almost clear or light green. This cleans out your colon in preparation for the procedure.  Do not eat or drink anything else once you have started the bowel prep, unless your health care provider tells you it is safe to do so.  Arrange for someone to drive you home after the procedure. PROCEDURE   You will be given medicine to help you relax (sedative).  You will lie on your side with your knees bent.  A long, flexible tube with a light and camera on the end (colonoscope) will be inserted through the rectum and into the colon. The camera sends video back to a computer screen as it moves through the colon. The colonoscope also releases carbon dioxide gas to inflate the colon. This helps your health care provider  see the area better.  During the exam, your health care provider may take a small tissue sample (biopsy) to be examined under a microscope if any abnormalities are found.  The exam is finished when the entire colon has been viewed. AFTER THE PROCEDURE   Do not drive for 24 hours after the exam.  You may have a small amount of blood in your stool.  You may pass moderate amounts of gas and have mild abdominal cramping or bloating. This is caused by the gas used to inflate your colon during the exam.  Ask when your test results will be ready and how you will get your results. Make sure you  get your test results. Document Released: 04/18/2000 Document Revised: 02/09/2013 Document Reviewed: 12/27/2012 [facility] Patient Information 2015 [place], [place]. This information is not intended to replace advice given to you by your health care provider. Make sure you discuss any questions you have with your health care provider. Esophagogastroduodenoscopy Esophagogastroduodenoscopy (EGD) is a procedure to examine the lining of the esophagus, stomach, and first part of the small intestine (duodenum). A long, flexible, lighted tube with a camera attached (endoscope) is inserted down the throat to view these organs. This procedure is done to detect problems or abnormalities, such as inflammation, bleeding, ulcers, or growths, in order to treat them. The procedure lasts about 5-20 minutes. It is usually an outpatient procedure, but it may need to be performed in emergency cases in the hospital. LET YOUR CAREGIVER KNOW ABOUT:   Allergies to food or medicine.  All medicines you are taking, including vitamins, herbs, eyedrops, and over-the-counter medicines and creams.  Use of steroids (by mouth or creams).  Previous problems you or members of your family have had with the use of anesthetics.  Any blood disorders you have.  Previous surgeries you have had.  Other health problems you have.  Possibility of pregnancy, if this applies. RISKS AND COMPLICATIONS  Generally, EGD is a safe procedure. However, as with any procedure, complications can occur. Possible complications include:  Infection.  Bleeding.  Tearing (perforation) of the esophagus, stomach, or duodenum.  Difficulty breathing or not being able to breath.  Excessive sweating.  Spasms of the larynx.  Slowed heartbeat.  Low blood pressure. BEFORE THE PROCEDURE  Do not eat or drink anything for 6-8 hours before the procedure or as directed by your caregiver.  Ask your caregiver about changing or stopping your regular  medicines.  If you wear dentures, be prepared to remove them before the procedure.  Arrange for someone to drive you home after the procedure. PROCEDURE   A vein will be accessed to give medicines and fluids. A medicine to relax you (sedative) and a pain reliever will be given through that access into the vein.  A numbing medicine (local anesthetic) may be sprayed on your throat for comfort and to stop you from gagging or coughing.  A mouth guard may be placed in your mouth to protect your teeth and to keep you from biting on the endoscope.  You will be asked to lie on your left side.  The endoscope is inserted down your throat and into the esophagus, stomach, and duodenum.  Air is put through the endoscope to allow your caregiver to view the lining of your esophagus clearly.  The esophagus, stomach, and duodenum is then examined. During the exam, your caregiver may:  Remove tissue to be examined under a microscope (biopsy) for inflammation, infection, or other medical problems.  Remove  growths.  Remove objects (foreign bodies) that are stuck.  Treat any bleeding with medicines or other devices that stop tissues from bleeding (hot cautery, clipping devices).  Widen (dilate) or stretch narrowed areas of the esophagus and stomach.  The endoscope will then be withdrawn. AFTER THE PROCEDURE  You will be taken to a recovery area to be monitored. You will be able to go home once you are stable and alert.  Do not eat or drink anything until the local anesthetic and numbing medicines have worn off. You may choke.  It is normal to feel bloated, have pain with swallowing, or have a sore throat for a short time. This will wear off.  Your caregiver should be able to discuss his or her findings with you. It will take longer to discuss the test results if any biopsies were taken. Document Released: 08/22/2004 Document Revised: 09/05/2013 Document Reviewed: 03/24/2012 [facility] Patient  Information 2015 [place], [place]. This information is not intended to replace advice given to you by your health care provider. Make sure you discuss any questions you have with your health care provider. PATIENT INSTRUCTIONS POST-ANESTHESIA  IMMEDIATELY FOLLOWING SURGERY:  Do not drive or operate machinery for the first twenty four hours after surgery.  Do not make any important decisions for twenty four hours after surgery or while taking narcotic pain medications or sedatives.  If you develop intractable nausea and vomiting or a severe headache please notify your doctor immediately.  FOLLOW-UP:  Please make an appointment with your surgeon as instructed. You do not need to follow up with anesthesia unless specifically instructed to do so.  WOUND CARE INSTRUCTIONS (if applicable):  Keep a dry clean dressing on the anesthesia/puncture wound site if there is drainage.  Once the wound has quit draining you may leave it open to air.  Generally you should leave the bandage intact for twenty four hours unless there is drainage.  If the epidural site drains for more than 36-48 hours please call the anesthesia department.  QUESTIONS?:  Please feel free to call your physician or the hospital operator if you have any questions, and they will be happy to assist you.

## 2014-01-19 ENCOUNTER — Encounter (HOSPITAL_COMMUNITY): Payer: BC Managed Care – PPO | Admitting: Anesthesiology

## 2014-01-19 ENCOUNTER — Ambulatory Visit (HOSPITAL_COMMUNITY): Payer: BC Managed Care – PPO | Admitting: Anesthesiology

## 2014-01-19 ENCOUNTER — Encounter (HOSPITAL_COMMUNITY): Payer: Self-pay | Admitting: *Deleted

## 2014-01-19 ENCOUNTER — Encounter (HOSPITAL_COMMUNITY)
Admission: RE | Disposition: A | Discharge status: Home / Self Care | Payer: Self-pay | Source: Ambulatory Visit | Attending: Internal Medicine

## 2014-01-19 ENCOUNTER — Ambulatory Visit (HOSPITAL_COMMUNITY)
Admission: RE | Admit: 2014-01-19 | Discharge: 2014-01-19 | Disposition: A | Discharge status: Home / Self Care | Payer: BC Managed Care – PPO | Source: Ambulatory Visit | Attending: Internal Medicine | Admitting: Internal Medicine

## 2014-01-19 DIAGNOSIS — IMO0001 Reserved for inherently not codable concepts without codable children: Secondary | ICD-10-CM | POA: Insufficient documentation

## 2014-01-19 DIAGNOSIS — K259 Gastric ulcer, unspecified as acute or chronic, without hemorrhage or perforation: Secondary | ICD-10-CM | POA: Insufficient documentation

## 2014-01-19 DIAGNOSIS — R195 Other fecal abnormalities: Secondary | ICD-10-CM

## 2014-01-19 DIAGNOSIS — E039 Hypothyroidism, unspecified: Secondary | ICD-10-CM | POA: Insufficient documentation

## 2014-01-19 DIAGNOSIS — K648 Other hemorrhoids: Secondary | ICD-10-CM

## 2014-01-19 DIAGNOSIS — F3289 Other specified depressive episodes: Secondary | ICD-10-CM | POA: Insufficient documentation

## 2014-01-19 DIAGNOSIS — F329 Major depressive disorder, single episode, unspecified: Secondary | ICD-10-CM | POA: Insufficient documentation

## 2014-01-19 DIAGNOSIS — K921 Melena: Secondary | ICD-10-CM | POA: Insufficient documentation

## 2014-01-19 DIAGNOSIS — Z79899 Other long term (current) drug therapy: Secondary | ICD-10-CM | POA: Insufficient documentation

## 2014-01-19 DIAGNOSIS — Z90710 Acquired absence of both cervix and uterus: Secondary | ICD-10-CM | POA: Insufficient documentation

## 2014-01-19 DIAGNOSIS — Q438 Other specified congenital malformations of intestine: Secondary | ICD-10-CM | POA: Insufficient documentation

## 2014-01-19 DIAGNOSIS — Z9884 Bariatric surgery status: Secondary | ICD-10-CM | POA: Insufficient documentation

## 2014-01-19 DIAGNOSIS — K644 Residual hemorrhoidal skin tags: Secondary | ICD-10-CM | POA: Insufficient documentation

## 2014-01-19 DIAGNOSIS — E119 Type 2 diabetes mellitus without complications: Secondary | ICD-10-CM | POA: Insufficient documentation

## 2014-01-19 DIAGNOSIS — Z09 Encounter for follow-up examination after completed treatment for conditions other than malignant neoplasm: Secondary | ICD-10-CM | POA: Insufficient documentation

## 2014-01-19 HISTORY — PX: COLONOSCOPY WITH PROPOFOL: SHX5780

## 2014-01-19 HISTORY — PX: ESOPHAGOGASTRODUODENOSCOPY (EGD) WITH PROPOFOL: SHX5813

## 2014-01-19 LAB — CBC
HCT: 32.7 % — ABNORMAL LOW (ref 36.0–46.0)
Hemoglobin: 9.9 g/dL — ABNORMAL LOW (ref 12.0–15.0)
MCH: 24.5 pg — ABNORMAL LOW (ref 26.0–34.0)
MCHC: 30.3 g/dL (ref 30.0–36.0)
MCV: 80.9 fL (ref 78.0–100.0)
PLATELETS: 374 10*3/uL (ref 150–400)
RBC: 4.04 MIL/uL (ref 3.87–5.11)
RDW: 14.7 % (ref 11.5–15.5)
WBC: 5.6 10*3/uL (ref 4.0–10.5)

## 2014-01-19 LAB — GLUCOSE, CAPILLARY
GLUCOSE-CAPILLARY: 107 mg/dL — AB (ref 70–99)
Glucose-Capillary: 122 mg/dL — ABNORMAL HIGH (ref 70–99)

## 2014-01-19 SURGERY — ESOPHAGOGASTRODUODENOSCOPY (EGD) WITH PROPOFOL
Anesthesia: Monitor Anesthesia Care

## 2014-01-19 MED ORDER — LACTATED RINGERS IV SOLN
INTRAVENOUS | Status: DC
  Administered 2014-01-19: 1000 mL via INTRAVENOUS

## 2014-01-19 MED ORDER — ONDANSETRON HCL 4 MG/2ML IJ SOLN
4.0000 mg | Freq: Once | INTRAMUSCULAR | Status: AC
  Administered 2014-01-19: 4 mg via INTRAVENOUS

## 2014-01-19 MED ORDER — PROPOFOL 10 MG/ML IV EMUL
INTRAVENOUS | Status: AC
  Filled 2014-01-19: qty 20

## 2014-01-19 MED ORDER — BUTAMBEN-TETRACAINE-BENZOCAINE 2-2-14 % EX AERO
2.0000 | INHALATION_SPRAY | Freq: Once | CUTANEOUS | Status: AC
  Administered 2014-01-19: 2 via TOPICAL
  Filled 2014-01-19: qty 56

## 2014-01-19 MED ORDER — MIDAZOLAM HCL 2 MG/2ML IJ SOLN
1.0000 mg | INTRAMUSCULAR | Status: DC | PRN
  Administered 2014-01-19: 2 mg via INTRAVENOUS

## 2014-01-19 MED ORDER — FENTANYL CITRATE 0.05 MG/ML IJ SOLN
25.0000 ug | INTRAMUSCULAR | Status: AC
  Administered 2014-01-19 (×2): 25 ug via INTRAVENOUS

## 2014-01-19 MED ORDER — MIDAZOLAM HCL 2 MG/2ML IJ SOLN
INTRAMUSCULAR | Status: AC
  Filled 2014-01-19: qty 2

## 2014-01-19 MED ORDER — FENTANYL CITRATE 0.05 MG/ML IJ SOLN
25.0000 ug | INTRAMUSCULAR | Status: DC | PRN
Start: 2014-01-19 — End: 2014-01-19

## 2014-01-19 MED ORDER — FENTANYL CITRATE 0.05 MG/ML IJ SOLN
INTRAMUSCULAR | Status: AC
  Filled 2014-01-19: qty 2

## 2014-01-19 MED ORDER — ONDANSETRON HCL 4 MG/2ML IJ SOLN
INTRAMUSCULAR | Status: AC
  Filled 2014-01-19: qty 2

## 2014-01-19 MED ORDER — WATER FOR IRRIGATION, STERILE IR SOLN
Status: DC | PRN
  Administered 2014-01-19: 1000 mL

## 2014-01-19 MED ORDER — PROPOFOL INFUSION 10 MG/ML OPTIME
INTRAVENOUS | Status: DC | PRN
  Administered 2014-01-19: 90 ug/kg/min via INTRAVENOUS
  Administered 2014-01-19 (×2): via INTRAVENOUS

## 2014-01-19 MED ORDER — GLYCOPYRROLATE 0.2 MG/ML IJ SOLN
0.2000 mg | Freq: Once | INTRAMUSCULAR | Status: AC
  Administered 2014-01-19: 0.2 mg via INTRAVENOUS

## 2014-01-19 MED ORDER — ONDANSETRON HCL 4 MG/2ML IJ SOLN: 4.0000 mg | Freq: Once | INTRAMUSCULAR | Status: DC | PRN

## 2014-01-19 MED ORDER — PANTOPRAZOLE SODIUM 40 MG PO TBEC: 40.0000 mg | DELAYED_RELEASE_TABLET | Freq: Every day | ORAL | Status: DC

## 2014-01-19 MED ORDER — MIDAZOLAM HCL 5 MG/5ML IJ SOLN
INTRAMUSCULAR | Status: DC | PRN
  Administered 2014-01-19: 4 mg via INTRAVENOUS

## 2014-01-19 MED ORDER — SIMETHICONE 40 MG/0.6ML PO SUSP
ORAL | Status: DC | PRN
  Administered 2014-01-19: 10:00:00

## 2014-01-19 MED ORDER — GLYCOPYRROLATE 0.2 MG/ML IJ SOLN
INTRAMUSCULAR | Status: AC
  Filled 2014-01-19: qty 1

## 2014-01-19 MED ORDER — LIDOCAINE VISCOUS 2 % MT SOLN: 4.0000 mL | Freq: Once | OROMUCOSAL | Status: DC

## 2014-01-19 MED ORDER — BUTAMBEN-TETRACAINE-BENZOCAINE 2-2-14 % EX AERO
INHALATION_SPRAY | CUTANEOUS | Status: AC
  Administered 2014-01-19: 09:00:00
  Filled 2014-01-19: qty 56

## 2014-01-19 SURGICAL SUPPLY — 27 items
BLOCK BITE 60FR ADLT L/F BLUE (MISCELLANEOUS) ×1 IMPLANT
DEVICE CLIP HEMOSTAT 235CM (CLIP) IMPLANT
ELECT REM PT RETURN 9FT ADLT (ELECTROSURGICAL)
ELECTRODE REM PT RTRN 9FT ADLT (ELECTROSURGICAL) IMPLANT
FCP BXJMBJMB 240X2.8X (CUTTING FORCEPS)
FLOOR PAD 36X40 (MISCELLANEOUS) ×2
FORCEPS BIOP RAD 4 LRG CAP 4 (CUTTING FORCEPS) IMPLANT
FORCEPS BIOP RJ4 240 W/NDL (CUTTING FORCEPS)
FORCEPS BXJMBJMB 240X2.8X (CUTTING FORCEPS) IMPLANT
FORMALIN 10 PREFIL 20ML (MISCELLANEOUS) IMPLANT
INJECTOR/SNARE I SNARE (MISCELLANEOUS) IMPLANT
KIT CLEAN ENDO COMPLIANCE (KITS) ×2 IMPLANT
LUBRICANT JELLY 4.5OZ STERILE (MISCELLANEOUS) ×1 IMPLANT
MANIFOLD NEPTUNE II (INSTRUMENTS) ×1 IMPLANT
NDL SCLEROTHERAPY 25GX240 (NEEDLE) IMPLANT
NEEDLE SCLEROTHERAPY 25GX240 (NEEDLE) IMPLANT
PAD FLOOR 36X40 (MISCELLANEOUS) IMPLANT
PROBE APC STR FIRE (PROBE) IMPLANT
PROBE INJECTION GOLD (MISCELLANEOUS)
PROBE INJECTION GOLD 7FR (MISCELLANEOUS) IMPLANT
SNARE ROTATE MED OVAL 20MM (MISCELLANEOUS) IMPLANT
SNARE SHORT THROW 13M SML OVAL (MISCELLANEOUS) ×2 IMPLANT
SYR 50ML LL SCALE MARK (SYRINGE) ×2 IMPLANT
SYR INFLATION 60ML (SYRINGE) ×1 IMPLANT
TRAP SPECIMEN MUCOUS 40CC (MISCELLANEOUS) IMPLANT
TUBING IRRIGATION ENDOGATOR (MISCELLANEOUS) ×1 IMPLANT
WATER STERILE IRR 1000ML POUR (IV SOLUTION) ×1 IMPLANT

## 2014-01-19 NOTE — H&P (View-Only) (Signed)
Referring Provider: Caren Macadam, MD Primary Care Physician:  [name], MD Primary GI: Dr. Gala Romney   Chief Complaint  Patient presents with  . Follow-up    HPI:   Molly Berry presents today with history of gastric bypass, found to have anastomotic ulcer on EGD in June 2015. H.pylori serology negative. Overall abdominal pain improved. No pain with eating. No N/V. Carafate QID, Protonix BID. A lot of gas but no constipation, diarrhea. No hematochezia. No melena. No FH of colon cancer. Heme positive stool noted at last visit. Needs colonoscopy with surveillance EGD now. Microcytic anemia noted. Unknown iron status. Not taking po iron s/p bariatric surgery. States she remembers the procedure.   Past Medical History  Diagnosis Date  . Depression   . Fibromyalgia   . Diabetes mellitus   . PONV (postoperative nausea and vomiting)   . Asthma   . Cancer   . Uterine cancer   . Thyroid disease   . Hypothyroidism   . Multiple thyroid nodules   . Kidney stones   . GERD (gastroesophageal reflux disease)   . Ulcer     Past Surgical History  Procedure Laterality Date  . Gastric bypass  2013    [facility]  . Cholecystectomy    . Dilation and curettage of uterus  12 yrs ago  . Hysteroscopy w/d&c  06/18/2011    Procedure: DILATATION AND CURETTAGE /HYSTEROSCOPY;  Surgeon: Florian Buff, MD;  Location: [facility];  Service: Gynecology;  Laterality: N/A;  . Dilation and curettage of uterus  06/18/2011    Procedure: DILATATION AND CURETTAGE;  Surgeon: Florian Buff, MD;  Location: [facility];  Service: Gynecology;  Laterality: N/A;  Suction Dilation and Curettage  . Abdominal hysterectomy    . Esophagogastroduodenoscopy N/A 10/12/2013    Dr. Rourk:anastomotic ulcer likely cause of bleeding. likely ischemic   . Lithotripsy      Current Outpatient Prescriptions  Medication Sig Dispense Refill  . albuterol (PROVENTIL HFA;VENTOLIN HFA) 108 (90 BASE) MCG/ACT inhaler Inhale 2  puffs into the lungs every 6 (six) hours as needed. For asthma       . ALPRAZolam (XANAX) 1 MG tablet Take 1 mg by mouth 3 (three) times daily as needed. FOR ANXIETY AND SLEEP      . Cholecalciferol (VITAMIN D PO) Take 1 tablet by mouth 2 (two) times daily.       . DULoxetine (CYMBALTA) 60 MG capsule Take 120 mg by mouth every morning.       Marland Kitchen HYDROcodone-acetaminophen (NORCO) 10-325 MG per tablet Take 1 tablet by mouth every 6 (six) hours as needed for moderate pain.      Marland Kitchen lamoTRIgine (LAMICTAL) 100 MG tablet Take 100 mg by mouth 2 (two) times daily.       . Melatonin 10 MG TABS Take 1 tablet by mouth at bedtime.       . Multiple Vitamins-Minerals (MULTIVITAMIN WITH MINERALS) tablet Take 1 tablet by mouth every morning.       Marland Kitchen oxyCODONE (ROXICODONE) 15 MG immediate release tablet Take 15 mg by mouth every 6 (six) hours as needed for pain.      . pantoprazole (PROTONIX) 40 MG tablet Take 40 mg by mouth 2 (two) times daily.      . SUMAtriptan (IMITREX) 100 MG tablet Take 100 mg by mouth as needed for migraine.       . tamsulosin (FLOMAX) 0.4 MG CAPS capsule Take 1 capsule (0.4  mg total) by mouth 2 (two) times daily.  10 capsule  0  . tiZANidine (ZANAFLEX) 4 MG tablet Take 4 mg by mouth at bedtime. For spasms.       No current facility-administered medications for this visit.    Allergies as of 12/26/2013 - Review Complete 12/26/2013  Allergen Reaction Noted  . Bee venom Shortness Of Breath, Swelling, and Rash 02/07/2011  . Cephalexin Anaphylaxis 01/12/2011  . Vancomycin Anaphylaxis and Hives 11/17/2013  . Penicillins Hives 01/12/2011  . Adhesive [tape] Rash 04/14/2013  . Latex Rash 04/14/2013    Family History  Problem Relation Age of Onset  . Anesthesia problems Neg Hx   . Hypotension Neg Hx   . Malignant hyperthermia Neg Hx   . Pseudochol deficiency Neg Hx   . Colon cancer Neg Hx   . Pneumonia Mother     History   Social History  . Marital Status: Married    Spouse Name:  N/A    Number of Children: N/A  . Years of Education: N/A   Occupational History  . seamstress(self employed)    Social History Main Topics  . Smoking status: Never Smoker   . Smokeless tobacco: None  . Alcohol Use: Yes     Comment: once a month  . Drug Use: No  . Sexual Activity: Yes    Birth Control/ Protection: None   Other Topics Concern  . None   Social History Narrative  . None    Review of Systems: Negative as mentioned in HPI  Physical Exam: BP 121/68  Pulse 95  Temp(Src) 98 F (36.7 C) (Oral)  Ht 5\' 4"  (1.626 m)  Wt 283 lb (128.368 kg)  BMI 48.55 kg/m2  LMP 05/06/2011 General:   Alert and oriented. No distress noted. Pleasant and cooperative.  Eyes:  Conjuctiva clear without scleral icterus. Mouth:  Oral mucosa pink and moist. Good dentition. No lesions. Heart:  S1, S2 present without murmurs, rubs, or gallops. Regular rate and rhythm. Abdomen:  +BS, soft, non-tender and non-distended. No rebound or guarding. No HSM or masses noted. Msk:  Symmetrical without gross deformities. Normal posture. Extremities:  Without edema. Neurologic:  Alert and  oriented x4;  grossly normal neurologically. Skin:  Intact without significant lesions or rashes. Psych:  Alert and cooperative. Normal mood and affect.  Lab Results  Component Value Date   WBC 9.4 11/16/2013   HGB 11.9* 11/16/2013   HCT 37.7 11/16/2013   MCV 84.2 11/16/2013   PLT 451* 11/16/2013

## 2014-01-19 NOTE — Op Note (Signed)
[facility] [address] [place], [address]   COLONOSCOPY PROCEDURE REPORT  PATIENT: Molly Berry, Molly Berry  MR#:         [id number] BIRTHDATE: 10/22/1965 , 48  yrs. old GENDER: Female ENDOSCOPIST: R.  Garfield Cornea, MD FACP FACG REFERRED BY:  Micheline Rough, M.D. PROCEDURE DATE:  01/19/2014 PROCEDURE:     Diagnostic Ileocolonoscopy INDICATIONS: Hemoccult-positive stool  INFORMED CONSENT:  The risks, benefits, alternatives and imponderables including but not limited to bleeding, perforation as well as the possibility of a missed lesion have been reviewed.  The potential for biopsy, lesion removal, etc. have also been discussed.  Questions have been answered.  All parties agreeable. Please see the history and physical in the medical record for more information.  MEDICATIONS: deep sedation per Dr. [facility]  DESCRIPTION OF PROCEDURE:  After a digital rectal exam was performed, the     colonoscope was advanced from the anus through the rectum and colon to the area of the cecum, ileocecal valve and appendiceal orifice.  The cecum was deeply intubated.  These structures were well-seen and photographed for the record.  From the level of the cecum and ileocecal valve, the scope was slowly and cautiously withdrawn.  The mucosal surfaces were carefully surveyed utilizing scope tip deflection to facilitate fold flattening as needed.  The scope was pulled down into the rectum where a thorough examination including retroflexion was performed.     FINDINGS:  inadequate prep which eventually became adequate with quite a bit of washing and lavage. Normal rectum aside from anal canal hemorrhoids. Redundant, elongated colon. Normal-appearing colonic mucosa. Distal 10 cm of terminal ileal mucosa appeared normal.  THERAPEUTIC / DIAGNOSTIC MANEUVERS PERFORMED:  none  COMPLICATIONS: none  CECAL WITHDRAWAL TIME:  9 minutes  IMPRESSION:  Hemorrhoids. Redundant  but otherwise normal appearing:  RECOMMENDATIONS: See EGD report. CBC today. Recommend repeat colonoscopy for screening purposes in 10 years.  _______________________________ eSigned:  R. Garfield Cornea, MD FACP [facility] 01/19/2014 10:36 AM   CC:

## 2014-01-19 NOTE — Interval H&P Note (Signed)
History and Physical Interval Note:  01/19/2014 9:26 AM  Molly Berry  has presented today for surgery, with the diagnosis of ANASTOMOTIC ULCER, HEME POSITIVE STOOL  The various methods of treatment have been discussed with the patient and family. After consideration of risks, benefits and other options for treatment, the patient has consented to  Procedure(s): ESOPHAGOGASTRODUODENOSCOPY (EGD) WITH PROPOFOL (N/A) COLONOSCOPY WITH PROPOFOL (N/A) as a surgical intervention .  The patient's history has been reviewed, patient examined, no change in status, stable for surgery.  I have reviewed the patient's chart and labs.  Questions were answered to the patient's satisfaction.      No change. EGD and colonoscopy per plan under deep sedation.The risks, benefits, limitations, imponderables and alternatives regarding both EGD and colonoscopy have been reviewed with the patient. Questions have been answered. All parties agreeable.    Manus Rudd

## 2014-01-19 NOTE — Op Note (Signed)
[facility] [address] [place], [address]   ENDOSCOPY PROCEDURE REPORT  PATIENT: Molly, Berry  MR#: [id number] BIRTHDATE: [birth year] , 48  yrs. old GENDER: Female ENDOSCOPIST: R.  Garfield Cornea, MD FACP FACG REFERRED BY:  Micheline Rough, M.D. PROCEDURE DATE:  01/19/2014 PROCEDURE:     Surveillance EGD  INDICATIONS:     history of anastomotic ulcer  INFORMED CONSENT:   The risks, benefits, limitations, alternatives and imponderables have been discussed.  The potential for biopsy, esophogeal dilation, etc. have also been reviewed.  Questions have been answered.  All parties agreeable.  Please see the history and physical in the medical record for more information.  MEDICATIONS:    Deep sedation per Dr. [facility]  DESCRIPTION OF PROCEDURE:   The     endoscope was introduced through the mouth and advanced to the second portion of the duodenum without difficulty or limitations.  The mucosal surfaces were surveyed very carefully during advancement of the scope and upon withdrawal.  Retroflexion view of the proximal stomach and esophagogastric junction was performed.      FINDINGS: Normal esophagus. Surgically altered stomach with relatively small gastric pouch previously noted gastric ulcer completely healed. Patent eferent limb.  normal-appearing  small bowelmucosa  Plan.  THERAPEUTIC / DIAGNOSTIC MANEUVERS PERFORMED:   none   COMPLICATIONS:  None  IMPRESSION:  status post prior gastric surgery. Previously noted anastomotic ulcer completely healed.  RECOMMENDATIONS:  Avoid all nonsteroidal agents; may decrease Protonix to 40 mg once daily. See colonoscopy report.    _______________________________ R. Garfield Cornea, MD FACP [facility] eSigned:  R. Garfield Cornea, MD FACP [facility] 01/19/2014 10:33 AM     CC:

## 2014-01-19 NOTE — Anesthesia Preprocedure Evaluation (Signed)
Anesthesia Evaluation  Patient identified by MRN, date of birth, ID band Patient awake    Reviewed: Allergy & Precautions, H&P , NPO status , Patient's Chart, lab work & pertinent test results  History of Anesthesia Complications (+) PONV and history of anesthetic complications  Airway Mallampati: III TM Distance: >3 FB     Dental  (+) Teeth Intact   Pulmonary shortness of breath and with exertion, asthma ,  breath sounds clear to auscultation        Cardiovascular negative cardio ROS  Rhythm:Regular Rate:Normal     Neuro/Psych PSYCHIATRIC DISORDERS Depression    GI/Hepatic PUD, S/p gastric bypass   Endo/Other  diabetes, Well Controlled, Type 2Hypothyroidism Morbid obesity  Renal/GU      Musculoskeletal  (+) Fibromyalgia -  Abdominal   Peds  Hematology  (+) anemia ,   Anesthesia Other Findings   Reproductive/Obstetrics                           Anesthesia Physical Anesthesia Plan  ASA: III  Anesthesia Plan: MAC   Post-op Pain Management:    Induction: Intravenous  Airway Management Planned: Simple Face Mask  Additional Equipment:   Intra-op Plan:   Post-operative Plan:   Informed Consent: I have reviewed the patients History and Physical, chart, labs and discussed the procedure including the risks, benefits and alternatives for the proposed anesthesia with the patient or authorized representative who has indicated his/her understanding and acceptance.     Plan Discussed with:   Anesthesia Plan Comments:         Anesthesia Quick Evaluation

## 2014-01-19 NOTE — Transfer of Care (Signed)
Immediate Anesthesia Transfer of Care Note  Patient: Molly Berry  Procedure(s) Performed: Procedure(s) with comments: ESOPHAGOGASTRODUODENOSCOPY (EGD) WITH PROPOFOL (N/A) COLONOSCOPY WITH PROPOFOL (N/A) - in cecum at 1004; withdrawal time 9 minutes  Patient Location: PACU  Anesthesia Type:MAC  Level of Consciousness: awake, alert , oriented and patient cooperative  Airway & Oxygen Therapy: Patient Spontanous Breathing and Patient connected to face mask oxygen  Post-op Assessment: Report given to PACU RN, Post -op Vital signs reviewed and stable and Patient moving all extremities  Post vital signs: Reviewed and stable  Complications: No apparent anesthesia complications

## 2014-01-19 NOTE — Anesthesia Postprocedure Evaluation (Signed)
  Anesthesia Post-op Note  Patient: Molly Berry  Procedure(s) Performed: Procedure(s) with comments: ESOPHAGOGASTRODUODENOSCOPY (EGD) WITH PROPOFOL (N/A) COLONOSCOPY WITH PROPOFOL (N/A) - in cecum at 1004; withdrawal time 9 minutes  Patient Location: PACU  Anesthesia Type:MAC  Level of Consciousness: awake, alert , oriented and patient cooperative  Airway and Oxygen Therapy: Patient Spontanous Breathing  Post-op Pain: none  Post-op Assessment: Post-op Vital signs reviewed, Patient's Cardiovascular Status Stable, Respiratory Function Stable, Patent Airway, No signs of Nausea or vomiting, Adequate PO intake and Pain level controlled  Post-op Vital Signs: Reviewed and stable  Last Vitals:  Filed Vitals:   01/19/14 0925  BP: 133/81  Pulse:   Temp:   Resp: 18    Complications: No apparent anesthesia complications

## 2014-01-19 NOTE — Discharge Instructions (Addendum)
Colonoscopy Discharge Instructions  Read the instructions outlined below and refer to this sheet in the next few weeks. These discharge instructions provide you with general information on caring for yourself after you leave the hospital. Your doctor may also give you specific instructions. While your treatment has been planned according to the most current medical practices available, unavoidable complications occasionally occur. If you have any problems or questions after discharge, call Dr. Gala Romney at [phone]. ACTIVITY  You may resume your regular activity, but move at a slower pace for the next 24 hours.   Take frequent rest periods for the next 24 hours.   Walking will help get rid of the air and reduce the bloated feeling in your belly (abdomen).   No driving for 24 hours (because of the medicine (anesthesia) used during the test).    Do not sign any important legal documents or operate any machinery for 24 hours (because of the anesthesia used during the test).  NUTRITION  Drink plenty of fluids.   You may resume your normal diet as instructed by your doctor.   Begin with a light meal and progress to your normal diet. Heavy or fried foods are harder to digest and may make you feel sick to your stomach (nauseated).   Avoid alcoholic beverages for 24 hours or as instructed.  MEDICATIONS  You may resume your normal medications unless your doctor tells you otherwise.  WHAT YOU CAN EXPECT TODAY  Some feelings of bloating in the abdomen.   Passage of more gas than usual.   Spotting of blood in your stool or on the toilet paper.  IF YOU HAD POLYPS REMOVED DURING THE COLONOSCOPY:  No aspirin products for 7 days or as instructed.   No alcohol for 7 days or as instructed.   Eat a soft diet for the next 24 hours.  FINDING OUT THE RESULTS OF YOUR TEST Not all test results are available during your visit. If your test results are not back during the visit, make an appointment  with your caregiver to find out the results. Do not assume everything is normal if you have not heard from your caregiver or the medical facility. It is important for you to follow up on all of your test results.  SEEK IMMEDIATE MEDICAL ATTENTION IF:  You have more than a spotting of blood in your stool.   Your belly is swollen (abdominal distention).   You are nauseated or vomiting.   You have a temperature over 101.  You have abdominal pain or discomfort that is severe or gets worse throughout the day. EGD Discharge instructions Please read the instructions outlined below and refer to this sheet in the next few weeks. These discharge instructions provide you with general information on caring for yourself after you leave the hospital. Your doctor may also give you specific instructions. While your treatment has been planned according to the most current medical practices available, unavoidable complications occasionally occur. If you have any problems or questions after discharge, please call your doctor. ACTIVITY You may resume your regular activity but move at a slower pace for the next 24 hours.  Take frequent rest periods for the next 24 hours.  Walking will help expel (get rid of) the air and reduce the bloated feeling in your abdomen.  No driving for 24 hours (because of the anesthesia (medicine) used during the test).  You may shower.  Do not sign any important legal documents or operate any machinery for 24  hours (because of the anesthesia used during the test).  NUTRITION Drink plenty of fluids.  You may resume your normal diet.  Begin with a light meal and progress to your normal diet.  Avoid alcoholic beverages for 24 hours or as instructed by your caregiver.  MEDICATIONS You may resume your normal medications unless your caregiver tells you otherwise.  WHAT YOU CAN EXPECT TODAY You may experience abdominal discomfort such as a feeling of fullness or gas pains.   FOLLOW-UP Your doctor will discuss the results of your test with you.  SEEK IMMEDIATE MEDICAL ATTENTION IF ANY OF THE FOLLOWING OCCUR: Excessive nausea (feeling sick to your stomach) and/or vomiting.  Severe abdominal pain and distention (swelling).  Trouble swallowing.  Temperature over 101 F (37.8 C).  Rectal bleeding or vomiting of blood.   May decrease Protonix to 40 mg once daily  Avoid all nonsteroidal agents.  Repeat colonoscopy for screening purposes in 10 years  CBC today

## 2014-01-20 ENCOUNTER — Telehealth: Payer: Self-pay | Admitting: Gastroenterology

## 2014-01-20 ENCOUNTER — Encounter (HOSPITAL_COMMUNITY): Payer: Self-pay | Admitting: Internal Medicine

## 2014-01-20 NOTE — Telephone Encounter (Signed)
PT CALLED HAVING LOWER ABDOMINAL PAIN, HAS Rx NORCO/ROXICODONE. RECOMMENDED SHE USE. IT PRN ABDOMINAL PAIN. EXPLAINED TO PT SHE LIKELY HAS BOWEL SPASM DUE TO HER ELONGATED COLON, BUT SHE COULD HAVE A HOLE ON HER COLON. I DISCUSSED TWO OPTIONS: 1. WAIT AND SEE AND CALL BACK IF SX ARE NOT BETTER OR 2. COME TO [facility] TO HAVE A CT SCAN NOW.  PT ELECTED TO WAIT AND SEE. SHE WILL CALL BACK WITH QUESTIONS.

## 2014-01-25 ENCOUNTER — Encounter: Payer: BC Managed Care – PPO | Attending: "Endocrinology | Admitting: Nutrition

## 2014-01-25 DIAGNOSIS — Z6841 Body Mass Index (BMI) 40.0 and over, adult: Secondary | ICD-10-CM | POA: Diagnosis not present

## 2014-01-25 DIAGNOSIS — E119 Type 2 diabetes mellitus without complications: Secondary | ICD-10-CM | POA: Diagnosis not present

## 2014-01-25 DIAGNOSIS — Z9884 Bariatric surgery status: Secondary | ICD-10-CM | POA: Insufficient documentation

## 2014-01-25 DIAGNOSIS — Z713 Dietary counseling and surveillance: Secondary | ICD-10-CM | POA: Insufficient documentation

## 2014-01-25 NOTE — Progress Notes (Signed)
  Medical Nutrition Therapy:  Appt start time: 1130 end time:  1230.   Assessment:  Primary concerns today is she has regained about 60 lbs since she had her gastric bypass surgery since 2013.. Lives with husband, has three adopted childrens.. Had gastric bypass 2013 at [facility]. Had uterine cancer in 2014 with multiple surgeries. Has gained 60 lbs back last 2 years. Gained 16 lbs in the last 2 weeks.  Can't make protein shakes because they make her throw up. Has Fibromyalgia which limits her activity due to pain in her body. Does her own shopping and cooking. Mostly eats baked and broiled foods. Drinks a lot of sugar free tea. She reports craving ice and unsweet tea. She notes she was taking only 1 gummy vitamin a day and knows she is suppose to be taking more. She reports taking oxycodone and other pain meds as needed for pain from Fibromyalgia.  Preferred Learning Style:   Auditory  Visual  Hands on   Learning Readiness:    Ready   MEDICATIONS: see list   DIETARY INTAKE:  24-hr recall:  B (7:45 AM): Pb crackers 4,water 20z Snk ( AM): none  L ( 11-1PM): roll ham and cheese, crackers 8, sf tea 32 that lasts all afternnon, sometimes skips but usually doesn' Snk ( PM): donuts; candy bars or D ( 7:30PM): Brocolli cream of soup, toss salad-onions, lettuce, cheese, ranch dressing, 2 crackers, SF Tea Snk ( PM): none Beverages: water, sf tea 32-64 oz per day   Usual physical activity: not much due to pain from walking  Estimated energy needs: 1500 calories 170 g carbohydrates 112 g protein 42 g fat  Progress Towards Goal(s):  In progress.   Nutritional Diagnosis:  Food and nutrition knowledge deficit related to recent weight gain and inaccurate information as evidenced by 60 lbs weight gain, diet history and inaccurate information verbalized..    Intervention:  Nutrition counseling for weight loss on meal planning,  MY Plate, post gastric bypass nutritional needs. portion sizes  and benefits of exercsie.. Goals:  Eat 3 meals/day, Avoid meal skipping   Increase protein rich foods  Follow "Plate Method" for portion control  Limit carbohydrate1-2 servings/meal   Choose more whole grains, lean protein, low-fat dairy, and fruits/non-starchy vegetables.   Aim for >30 min of physical activity daily  Limit sugar-sweetened beverages and concentrated sweets  Drinks liquids between meals and not with meals. Eat three meals per day, measuring foods out Avoid sweets, donuts, cakes and desserts.  Teaching Method Utilized:  Visual Auditory Hands on  Handouts given during visit include:  Carb Counting   Meal Plan Card   Barriers to learning/adherence to lifestyle change: depression and complicated chronic medical problems limiting mobility   Demonstrated degree of understanding via:  Teach Back   Monitoring/Evaluation:  Dietary intake, exercise, mea plan, My Plate, and body weight in 1 month.

## 2014-01-25 NOTE — Patient Instructions (Addendum)
Goals:  Eat 3 meals/day, Avoid meal skipping   Increase protein rich foods  Follow "Plate Method" for portion control  Limit carbohydrate1-2 servings/meal   Choose more whole grains, lean protein, low-fat dairy, and fruits/non-starchy vegetables.   Aim for >30 min of physical activity daily  Limit sugar-sweetened beverages and concentrated sweets  Keep a food journal and bring at each visit.  Only drink liquids after meals  Take vitamins as previously prescribed post op from [facility].

## 2014-01-26 LAB — FERRITIN: Ferritin: 10 ng/mL (ref 10–291)

## 2014-01-26 LAB — IRON: Iron: 18 ug/dL — ABNORMAL LOW (ref 42–145)

## 2014-01-26 NOTE — Progress Notes (Signed)
Quick Note:  IDA noted. She needs to be taking supplemental iron due to history of gastric bypass. Needs ferrous sulfate 325 mg po BID. Could also give samples of fusionplus if she would rather try this. Let's recheck CBC, iron and ferritin in 6 weeks. If no improvement with oral iron, may need to be seen by hematology. ______

## 2014-02-17 ENCOUNTER — Other Ambulatory Visit: Payer: Self-pay | Admitting: Urology

## 2014-02-17 ENCOUNTER — Other Ambulatory Visit: Payer: Self-pay | Admitting: Gastroenterology

## 2014-02-17 ENCOUNTER — Ambulatory Visit (HOSPITAL_COMMUNITY)
Admission: RE | Admit: 2014-02-17 | Discharge: 2014-02-17 | Disposition: A | Discharge status: Home / Self Care | Payer: BC Managed Care – PPO | Source: Ambulatory Visit | Attending: Urology | Admitting: Urology

## 2014-02-17 DIAGNOSIS — Z87442 Personal history of urinary calculi: Secondary | ICD-10-CM | POA: Insufficient documentation

## 2014-02-17 DIAGNOSIS — Z09 Encounter for follow-up examination after completed treatment for conditions other than malignant neoplasm: Secondary | ICD-10-CM | POA: Diagnosis present

## 2014-02-17 DIAGNOSIS — N200 Calculus of kidney: Secondary | ICD-10-CM

## 2014-02-17 DIAGNOSIS — D509 Iron deficiency anemia, unspecified: Secondary | ICD-10-CM

## 2014-02-17 NOTE — Progress Notes (Signed)
OV NIC'D FOR THREE MONTHS

## 2014-02-24 ENCOUNTER — Encounter: Payer: BC Managed Care – PPO | Admitting: Nutrition

## 2014-02-28 ENCOUNTER — Encounter (HOSPITAL_COMMUNITY): Payer: Self-pay

## 2014-02-28 ENCOUNTER — Encounter (HOSPITAL_COMMUNITY): Payer: BC Managed Care – PPO | Attending: Hematology and Oncology

## 2014-02-28 VITALS — BP 145/76 | HR 65 | Temp 99.0°F | Resp 20 | Ht 63.25 in | Wt 279.2 lb

## 2014-02-28 DIAGNOSIS — R55 Syncope and collapse: Secondary | ICD-10-CM | POA: Insufficient documentation

## 2014-02-28 DIAGNOSIS — Z9884 Bariatric surgery status: Secondary | ICD-10-CM | POA: Insufficient documentation

## 2014-02-28 DIAGNOSIS — E538 Deficiency of other specified B group vitamins: Secondary | ICD-10-CM | POA: Insufficient documentation

## 2014-02-28 DIAGNOSIS — C541 Malignant neoplasm of endometrium: Secondary | ICD-10-CM | POA: Diagnosis not present

## 2014-02-28 DIAGNOSIS — D508 Other iron deficiency anemias: Secondary | ICD-10-CM | POA: Diagnosis present

## 2014-02-28 DIAGNOSIS — K219 Gastro-esophageal reflux disease without esophagitis: Secondary | ICD-10-CM | POA: Insufficient documentation

## 2014-02-28 DIAGNOSIS — G629 Polyneuropathy, unspecified: Secondary | ICD-10-CM | POA: Insufficient documentation

## 2014-02-28 DIAGNOSIS — E611 Iron deficiency: Secondary | ICD-10-CM

## 2014-02-28 DIAGNOSIS — K909 Intestinal malabsorption, unspecified: Secondary | ICD-10-CM

## 2014-02-28 LAB — CBC WITH DIFFERENTIAL/PLATELET
BASOS PCT: 1 % (ref 0–1)
Basophils Absolute: 0 10*3/uL (ref 0.0–0.1)
Eosinophils Absolute: 0.3 10*3/uL (ref 0.0–0.7)
Eosinophils Relative: 4 % (ref 0–5)
HEMATOCRIT: 36.6 % (ref 36.0–46.0)
HEMOGLOBIN: 11 g/dL — AB (ref 12.0–15.0)
Lymphocytes Relative: 28 % (ref 12–46)
Lymphs Abs: 1.7 10*3/uL (ref 0.7–4.0)
MCH: 24.1 pg — ABNORMAL LOW (ref 26.0–34.0)
MCHC: 30.1 g/dL (ref 30.0–36.0)
MCV: 80.1 fL (ref 78.0–100.0)
MONO ABS: 0.4 10*3/uL (ref 0.1–1.0)
Monocytes Relative: 7 % (ref 3–12)
Neutro Abs: 3.9 10*3/uL (ref 1.7–7.7)
Neutrophils Relative %: 60 % (ref 43–77)
Platelets: 472 10*3/uL — ABNORMAL HIGH (ref 150–400)
RBC: 4.57 MIL/uL (ref 3.87–5.11)
RDW: 15.3 % (ref 11.5–15.5)
WBC: 6.3 10*3/uL (ref 4.0–10.5)

## 2014-02-28 LAB — COMPREHENSIVE METABOLIC PANEL
ALT: 9 U/L (ref 0–35)
ANION GAP: 11 (ref 5–15)
AST: 14 U/L (ref 0–37)
Albumin: 3.7 g/dL (ref 3.5–5.2)
Alkaline Phosphatase: 133 U/L — ABNORMAL HIGH (ref 39–117)
BILIRUBIN TOTAL: 0.3 mg/dL (ref 0.3–1.2)
BUN: 14 mg/dL (ref 6–23)
CHLORIDE: 101 meq/L (ref 96–112)
CO2: 28 mEq/L (ref 19–32)
CREATININE: 1.05 mg/dL (ref 0.50–1.10)
Calcium: 9.2 mg/dL (ref 8.4–10.5)
GFR calc Af Amer: 72 mL/min — ABNORMAL LOW (ref >90)
GFR calc non Af Amer: 62 mL/min — ABNORMAL LOW (ref >90)
Glucose, Bld: 100 mg/dL — ABNORMAL HIGH (ref 70–99)
Potassium: 4.5 mEq/L (ref 3.7–5.3)
Sodium: 140 mEq/L (ref 137–147)
Total Protein: 7.7 g/dL (ref 6.0–8.3)

## 2014-02-28 NOTE — Progress Notes (Signed)
Molly Berry presented for labwork. Labs per MD order drawn via Peripheral Line 23 gauge needle inserted in left AC  Good blood return present. Procedure without incident.  Needle removed intact. Patient tolerated procedure well.

## 2014-02-28 NOTE — Patient Instructions (Addendum)
[facility] Discharge Instructions  RECOMMENDATIONS MADE BY THE CONSULTANT AND ANY TEST RESULTS WILL BE SENT TO YOUR REFERRING PHYSICIAN.  EXAM FINDINGS BY THE PHYSICIAN TODAY AND SIGNS OR SYMPTOMS TO REPORT TO CLINIC OR PRIMARY PHYSICIAN: Exam and findings as discussed by Dr. Barnet Glasgow.  Will check some labs today and will schedule you for feraheme infusions.      INSTRUCTIONS/FOLLOW-UP: Feraheme infusions 10/29 & 11/5 Labs and office visit in 6 weeks.  Thank you for choosing [facility] to provide your oncology and hematology care.  To afford each patient quality time with our providers, please arrive at least 15 minutes before your scheduled appointment time.  With your help, our goal is to use those 15 minutes to complete the necessary work-up to ensure our physicians have the information they need to help with your evaluation and healthcare recommendations.    Effective January 1st, 2014, we ask that you re-schedule your appointment with our physicians should you arrive 10 or more minutes late for your appointment.  We strive to give you quality time with our providers, and arriving late affects you and other patients whose appointments are after yours.    Again, thank you for choosing [facility].  Our hope is that these requests will decrease the amount of time that you wait before being seen by our physicians.       _____________________________________________________________  Should you have questions after your visit to [facility], please contact our office at [phone] between the hours of 8:30 a.m. and 4:30 p.m.  Voicemails left after 4:30 p.m. will not be returned until the following business day.  For prescription refill requests, have your pharmacy contact our office with your prescription refill request.    _______________________________________________________________  We hope that we have given you very good  care.  You may receive a patient satisfaction survey in the mail, please complete it and return it as soon as possible.  We value your feedback!  _______________________________________________________________  Have you asked about our STAR program?  STAR stands for Survivorship Training and Rehabilitation, and this is a nationally recognized cancer care program that focuses on survivorship and rehabilitation.  Cancer and cancer treatments may cause problems, such as, pain, making you feel tired and keeping you from doing the things that you need or want to do. Cancer rehabilitation can help. Our goal is to reduce these troubling effects and help you have the best quality of life possible.  You may receive a survey from a nurse that asks questions about your current state of health.  Based on the survey results, all eligible patients will be referred to the [facility] program for an evaluation so we can better serve you!  A frequently asked questions sheet is available upon request.  Ferumoxytol injection What is this medicine? FERUMOXYTOL is an iron complex. Iron is used to make healthy red blood cells, which carry oxygen and nutrients throughout the body. This medicine is used to treat iron deficiency anemia in people with chronic kidney disease. This medicine may be used for other purposes; ask your health care provider or pharmacist if you have questions. COMMON BRAND NAME(S): Feraheme What should I tell my health care provider before I take this medicine? They need to know if you have any of these conditions: -anemia not caused by low iron levels -high levels of iron in the blood -magnetic resonance imaging (MRI) test scheduled -an unusual or allergic reaction  to iron, other medicines, foods, dyes, or preservatives -pregnant or trying to get pregnant -breast-feeding How should I use this medicine? This medicine is for injection into a vein. It is given by a health care professional in a  hospital or clinic setting. Talk to your pediatrician regarding the use of this medicine in children. Special care may be needed. Overdosage: If you think you've taken too much of this medicine contact a poison control center or emergency room at once. Overdosage: If you think you have taken too much of this medicine contact a poison control center or emergency room at once. NOTE: This medicine is only for you. Do not share this medicine with others. What if I miss a dose? It is important not to miss your dose. Call your doctor or health care professional if you are unable to keep an appointment. What may interact with this medicine? This medicine may interact with the following medications: -other iron products This list may not describe all possible interactions. Give your health care provider a list of all the medicines, herbs, non-prescription drugs, or dietary supplements you use. Also tell them if you smoke, drink alcohol, or use illegal drugs. Some items may interact with your medicine. What should I watch for while using this medicine? Visit your doctor or healthcare professional regularly. Tell your doctor or healthcare professional if your symptoms do not start to get better or if they get worse. You may need blood work done while you are taking this medicine. You may need to follow a special diet. Talk to your doctor. Foods that contain iron include: whole grains/cereals, dried fruits, beans, or peas, leafy green vegetables, and organ meats (liver, kidney). What side effects may I notice from receiving this medicine? Side effects that you should report to your doctor or health care professional as soon as possible: -allergic reactions like skin rash, itching or hives, swelling of the face, lips, or tongue -breathing problems -changes in blood pressure -feeling faint or lightheaded, falls -fever or chills -flushing, sweating, or hot feelings -swelling of the ankles or feet Side  effects that usually do not require medical attention (Report these to your doctor or health care professional if they continue or are bothersome.): -diarrhea -headache -nausea, vomiting -stomach pain This list may not describe all possible side effects. Call your doctor for medical advice about side effects. You may report side effects to FDA at 1-800-FDA-1088. Where should I keep my medicine? This drug is given in a hospital or clinic and will not be stored at home. NOTE: This sheet is a summary. It may not cover all possible information. If you have questions about this medicine, talk to your doctor, pharmacist, or health care provider.  2015, [employer]/Gold Standard. (2011-12-05 15:23:36)

## 2014-02-28 NOTE — Progress Notes (Signed)
[place] [facility]. [facility], M.D.  NEW PATIENT EVALUATION   Name: Molly Berry Date: 03/01/2014 MRN: [id number] DOB: 05/24/1965  PCP: [name], MD   REFERRING PHYSICIAN: Caren Macadam, MD  REASON FOR REFERRAL: Anemia, status post gastric bypass in 2013 at [place] is a 48 y.o. female who is referred by her family physician for management of anemia in the setting of previous gastric bypass surgery in 2013. She was found to have a stage I grade 1 endometrial carcinoma having undergone hysterectomy initially by robotic technique and then salpingo-oophorectomy on 12/26/2011 at [facility] receiving no postoperative treatment. She also undergone a gastric bypass operation in 2011 after which she lost 160 pounds but then gained 50 pounds back. She does suffer with craving for ice. She gets her fingernails painted with acrylic. She denies any vaginal bleeding, melena, hematochezia, hematuria, epistaxis, or hemoptysis. She does suffer with significant hot flashes and plans to see her gynecologist in the near future for recommendations. She has never had intravenous iron before.   PAST MEDICAL HISTORY:  has a past medical history of Depression; Fibromyalgia; Diabetes mellitus; PONV (postoperative nausea and vomiting); Asthma; Cancer; Uterine cancer; Thyroid disease; Hypothyroidism; Multiple thyroid nodules; GERD (gastroesophageal reflux disease); Ulcer; Pulmonary nodules; and COPD (chronic obstructive pulmonary disease).     PAST SURGICAL HISTORY: Past Surgical History  Procedure Laterality Date  . Gastric bypass  2013    [facility]  . Cholecystectomy    . Dilation and curettage of uterus  12 yrs ago  . Hysteroscopy w/d&c  06/18/2011    Procedure: DILATATION AND CURETTAGE /HYSTEROSCOPY;  Surgeon: Florian Buff, MD;  Location: [facility];  Service: Gynecology;  Laterality: N/A;  .  Dilation and curettage of uterus  06/18/2011    Procedure: DILATATION AND CURETTAGE;  Surgeon: Florian Buff, MD;  Location: [facility];  Service: Gynecology;  Laterality: N/A;  Suction Dilation and Curettage  . Abdominal hysterectomy    . Esophagogastroduodenoscopy N/A 10/12/2013    Dr. Rourk:anastomotic ulcer likely cause of bleeding. likely ischemic   . Lithotripsy    . Esophagogastroduodenoscopy (egd) with propofol N/A 01/19/2014    Procedure: ESOPHAGOGASTRODUODENOSCOPY (EGD) WITH PROPOFOL;  Surgeon: Daneil Dolin, MD;  Location: [facility];  Service: Endoscopy;  Laterality: N/A;  . Colonoscopy with propofol N/A 01/19/2014    Procedure: COLONOSCOPY WITH PROPOFOL;  Surgeon: Daneil Dolin, MD;  Location: [facility];  Service: Endoscopy;  Laterality: N/A;  in cecum at 1004; withdrawal time 9 minutes     CURRENT MEDICATIONS: has a current medication list which includes the following prescription(s): albuterol, alprazolam, carafate, cholecalciferol, clotrimazole-betamethasone, cyanocobalamin, diphenhydramine, duloxetine, gabapentin, lamotrigine, melatonin, multivitamin with minerals, ondansetron, oxycodone, pantoprazole, sumatriptan, tizanidine, hydrocodone-acetaminophen, and tamsulosin.   ALLERGIES: Bee venom; Cephalexin; Vancomycin; Penicillins; Adhesive; and Latex   SOCIAL HISTORY:  reports that she has never smoked. She has never used smokeless tobacco. She reports that she does not drink alcohol or use illicit drugs.   FAMILY HISTORY: family history includes Pneumonia in her mother. There is no history of Anesthesia problems, Hypotension, Malignant hyperthermia, Pseudochol deficiency, or Colon cancer.    REVIEW OF SYSTEMS:  Other than that discussed above is noncontributory.    PHYSICAL EXAM:  height is 5' 3.25" (1.607 m) and weight is 279 lb 3.2 oz (126.644 kg). Her temperature is 99 F (37.2 C). Her blood pressure  is 145/76 and her pulse is 65. Her respiration is 20 and oxygen saturation is  94%.    GENERAL:alert, no distress and comfortable. Morbidly obese, diaphoretic. SKIN: skin color, texture, turgor are normal, no rashes or significant lesions. Diaphoretic. EYES: normal, Conjunctiva are pink and non-injected, sclera clear OROPHARYNX:no exudate, no erythema and lips, buccal mucosa, and tongue normal  NECK: supple, thyroid normal size, non-tender, without nodularity CHEST: No breast masses. LYMPH:  no palpable lymphadenopathy in the cervical, axillary or inguinal LUNGS: clear to auscultation and percussion with normal breathing effort HEART: regular rate & rhythm and no murmurs ABDOMEN:abdomen soft, non-tender and normal bowel sounds. Mildly obese with no appreciation of organomegaly. No free fluid wave or shifting dullness. No CVA tenderness. MUSCULOSKELETALl:no cyanosis of digits, no clubbing or edema  NEURO: alert & oriented x 3 with fluent speech, no focal motor/sensory deficits    LABORATORY DATA:   01/19/2014: WBC 5.6, hemoglobin 9.9, weight latest ridge of 74,000, ferritin 10  Office Visit on 02/28/2014  Component Date Value Ref Range Status  . WBC 02/28/2014 6.3  4.0 - 10.5 K/uL Final  . RBC 02/28/2014 4.57  3.87 - 5.11 MIL/uL Final  . Hemoglobin 02/28/2014 11.0* 12.0 - 15.0 g/dL Final  . HCT 02/28/2014 36.6  36.0 - 46.0 % Final  . MCV 02/28/2014 80.1  78.0 - 100.0 fL Final  . MCH 02/28/2014 24.1* 26.0 - 34.0 pg Final  . MCHC 02/28/2014 30.1  30.0 - 36.0 g/dL Final  . RDW 02/28/2014 15.3  11.5 - 15.5 % Final  . Platelets 02/28/2014 472* 150 - 400 K/uL Final  . Neutrophils Relative % 02/28/2014 60  43 - 77 % Final  . Neutro Abs 02/28/2014 3.9  1.7 - 7.7 K/uL Final  . Lymphocytes Relative 02/28/2014 28  12 - 46 % Final  . Lymphs Abs 02/28/2014 1.7  0.7 - 4.0 K/uL Final  . Monocytes Relative 02/28/2014 7  3 - 12 % Final  . Monocytes Absolute 02/28/2014 0.4  0.1 - 1.0 K/uL Final  . Eosinophils Relative 02/28/2014 4  0 - 5 % Final  . Eosinophils Absolute  02/28/2014 0.3  0.0 - 0.7 K/uL Final  . Basophils Relative 02/28/2014 1  0 - 1 % Final  . Basophils Absolute 02/28/2014 0.0  0.0 - 0.1 K/uL Final  . Sodium 02/28/2014 140  137 - 147 mEq/L Final  . Potassium 02/28/2014 4.5  3.7 - 5.3 mEq/L Final  . Chloride 02/28/2014 101  96 - 112 mEq/L Final  . CO2 02/28/2014 28  19 - 32 mEq/L Final  . Glucose, Bld 02/28/2014 100* 70 - 99 mg/dL Final  . BUN 02/28/2014 14  6 - 23 mg/dL Final  . Creatinine, Ser 02/28/2014 1.05  0.50 - 1.10 mg/dL Final  . Calcium 02/28/2014 9.2  8.4 - 10.5 mg/dL Final  . Total Protein 02/28/2014 7.7  6.0 - 8.3 g/dL Final  . Albumin 02/28/2014 3.7  3.5 - 5.2 g/dL Final  . AST 02/28/2014 14  0 - 37 U/L Final  . ALT 02/28/2014 9  0 - 35 U/L Final  . Alkaline Phosphatase 02/28/2014 133* 39 - 117 U/L Final  . Total Bilirubin 02/28/2014 0.3  0.3 - 1.2 mg/dL Final  . GFR calc non Af Amer 02/28/2014 62* >90 mL/min Final  . GFR calc Af Amer 02/28/2014 72* >90 mL/min Final   Comment: (NOTE)  The eGFR has been calculated using the CKD EPI equation.                          This calculation has not been validated in all clinical situations.                          eGFR's persistently <90 mL/min signify possible Chronic Kidney                          Disease.  . Anion gap 02/28/2014 11  5 - 15 Final  . Ferritin 02/28/2014 12  10 - 291 ng/mL Final   Performed at [facility]  . Iron 02/28/2014 20* 42 - 135 ug/dL Final  . TIBC 02/28/2014 428  250 - 470 ug/dL Final  . Saturation Ratios 02/28/2014 5* 20 - 55 % Final  . UIBC 02/28/2014 408* 125 - 400 ug/dL Final   Performed at [facility]    Urinalysis    Component Value Date/Time   COLORURINE YELLOW 11/16/2013 [place] 11/16/2013 0655   LABSPEC 1.010 11/16/2013 0655   PHURINE 7.0 11/16/2013 0655   GLUCOSEU NEGATIVE 11/16/2013 0655   HGBUR TRACE* 11/16/2013 0655   BILIRUBINUR NEGATIVE 11/16/2013 0655   KETONESUR NEGATIVE  11/16/2013 0655   PROTEINUR NEGATIVE 11/16/2013 0655   UROBILINOGEN 0.2 11/16/2013 0655   NITRITE NEGATIVE 11/16/2013 0655   LEUKOCYTESUR NEGATIVE 11/16/2013 0655      '@RADIOGRAPHY' : Dg Abd 1 View  02/17/2014   CLINICAL DATA:  History of kidney stones, lithotripsy 2 months ago  EXAM: ABDOMEN - 1 VIEW  COMPARISON:  KUB of 12/03/2011 and CT abdomen pelvis of 11/18/2013  FINDINGS: There is a moderately large amount of bowel gas and feces overlying both renal outlines, and therefore renal calculi cannot be assessed. No definite renal or ureteral calculi are noted. A probable phlebolith is noted low in the right bony pelvis. The bowel gas pattern is nonspecific. Surgical clips are present in the right upper quadrant from prior cholecystectomy.  IMPRESSION: The renal outlines are obscured by overlying bowel gas and feces. No definite renal or ureteral calculi are noted.   Electronically Signed   By: Ivar Drape M.D.   On: 02/17/2014 09:13    PATHOLOGY: Grade 1 adenocarcinoma of the uterus.   IMPRESSION:  #1. Iron deficiency anemia secondary to malabsorption. #2. Status post gastric bypass surgery. #3. Endometrial carcinoma, stage I-A, status post TAH/BSO at separate occasions, no evidence of disease. #4. Vasomotor instability. #5. Peripheral neuropathy, on treatment. #6. Gastroesophageal reflux disease, on treatment. #7. Vitamin B-12 deficiency secondary to malabsorption, iron regular injections monthly.  PLAN:  #1. IV Feraheme on 03/06/2014 and 03/13/2014. #2. Follow-up in 6 weeks with CBC, ferritin. #3. Patient has undergone colonoscopy and gets regular mammograms  Doroteo Bradford, MD 03/01/2014 7:20 AM   DISCLAIMER:  This note was dictated with voice recognition softwre.  Similar sounding words can inadvertently be transcribed inaccurately and may not be corrected upon review.

## 2014-03-01 LAB — IRON AND TIBC
Iron: 20 ug/dL — ABNORMAL LOW (ref 42–135)
Saturation Ratios: 5 % — ABNORMAL LOW (ref 20–55)
TIBC: 428 ug/dL (ref 250–470)
UIBC: 408 ug/dL — ABNORMAL HIGH (ref 125–400)

## 2014-03-01 LAB — FERRITIN: Ferritin: 12 ng/mL (ref 10–291)

## 2014-03-02 ENCOUNTER — Encounter (HOSPITAL_COMMUNITY): Payer: Self-pay

## 2014-03-02 ENCOUNTER — Encounter (HOSPITAL_BASED_OUTPATIENT_CLINIC_OR_DEPARTMENT_OTHER): Payer: BC Managed Care – PPO

## 2014-03-02 VITALS — BP 130/62 | HR 82 | Temp 98.4°F | Resp 18

## 2014-03-02 DIAGNOSIS — D508 Other iron deficiency anemias: Secondary | ICD-10-CM

## 2014-03-02 DIAGNOSIS — D509 Iron deficiency anemia, unspecified: Secondary | ICD-10-CM

## 2014-03-02 DIAGNOSIS — K912 Postsurgical malabsorption, not elsewhere classified: Secondary | ICD-10-CM

## 2014-03-02 DIAGNOSIS — Z9884 Bariatric surgery status: Secondary | ICD-10-CM

## 2014-03-02 MED ORDER — SODIUM CHLORIDE 0.9 % IV SOLN
510.0000 mg | Freq: Once | INTRAVENOUS | Status: AC
  Administered 2014-03-02: 510 mg via INTRAVENOUS
  Filled 2014-03-02: qty 17

## 2014-03-02 MED ORDER — SODIUM CHLORIDE 0.9 % IJ SOLN: 10.0000 mL | INTRAMUSCULAR | Status: DC | PRN

## 2014-03-02 MED ORDER — SODIUM CHLORIDE 0.9 % IV SOLN
Freq: Once | INTRAVENOUS | Status: AC
  Administered 2014-03-02: 12:00:00 via INTRAVENOUS

## 2014-03-02 NOTE — Patient Instructions (Signed)
[place] Discharge Instructions  RECOMMENDATIONS MADE BY THE CONSULTANT AND ANY TEST RESULTS WILL BE SENT TO YOUR REFERRING PHYSICIAN.  You had an iron transfusion today.  Please call teh clinic if you have any concerns or questions  Thank you for choosing [facility] to provide your oncology and hematology care.  To afford each patient quality time with our providers, please arrive at least 15 minutes before your scheduled appointment time.  With your help, our goal is to use those 15 minutes to complete the necessary work-up to ensure our physicians have the information they need to help with your evaluation and healthcare recommendations.    Effective January 1st, 2014, we ask that you re-schedule your appointment with our physicians should you arrive 10 or more minutes late for your appointment.  We strive to give you quality time with our providers, and arriving late affects you and other patients whose appointments are after yours.    Again, thank you for choosing [facility].  Our hope is that these requests will decrease the amount of time that you wait before being seen by our physicians.       _____________________________________________________________  Should you have questions after your visit to [facility], please contact our office at [phone] between the hours of 8:30 a.m. and 5:00 p.m.  Voicemails left after 4:30 p.m. will not be returned until the following business day.  For prescription refill requests, have your pharmacy contact our office with your prescription refill request.

## 2014-03-02 NOTE — Progress Notes (Signed)
Molly Berry Had an iron transfusion today

## 2014-03-03 ENCOUNTER — Ambulatory Visit: Payer: BC Managed Care – PPO | Admitting: Urology

## 2014-03-09 ENCOUNTER — Encounter (HOSPITAL_COMMUNITY): Payer: Self-pay

## 2014-03-09 ENCOUNTER — Encounter (HOSPITAL_COMMUNITY): Payer: BC Managed Care – PPO | Attending: Hematology and Oncology

## 2014-03-09 DIAGNOSIS — D509 Iron deficiency anemia, unspecified: Secondary | ICD-10-CM | POA: Insufficient documentation

## 2014-03-09 MED ORDER — SODIUM CHLORIDE 0.9 % IJ SOLN
10.0000 mL | INTRAMUSCULAR | Status: DC | PRN
  Administered 2014-03-09: 10 mL
  Filled 2014-03-09: qty 10

## 2014-03-09 MED ORDER — SODIUM CHLORIDE 0.9 % IV SOLN
510.0000 mg | Freq: Once | INTRAVENOUS | Status: AC
  Administered 2014-03-09: 510 mg via INTRAVENOUS
  Filled 2014-03-09: qty 17

## 2014-03-09 MED ORDER — SODIUM CHLORIDE 0.9 % IV SOLN
Freq: Once | INTRAVENOUS | Status: AC
  Administered 2014-03-09: 11:00:00 via INTRAVENOUS

## 2014-03-09 NOTE — Patient Instructions (Signed)
[place] Discharge Instructions  RECOMMENDATIONS MADE BY THE CONSULTANT AND ANY TEST RESULTS WILL BE SENT TO YOUR REFERRING PHYSICIAN.  Today you received feraheme (dose 2). Return as scheduled for blood work and office visit.  Thank you for choosing [facility] to provide your oncology and hematology care.  To afford each patient quality time with our providers, please arrive at least 15 minutes before your scheduled appointment time.  With your help, our goal is to use those 15 minutes to complete the necessary work-up to ensure our physicians have the information they need to help with your evaluation and healthcare recommendations.    Effective January 1st, 2014, we ask that you re-schedule your appointment with our physicians should you arrive 10 or more minutes late for your appointment.  We strive to give you quality time with our providers, and arriving late affects you and other patients whose appointments are after yours.    Again, thank you for choosing [facility].  Our hope is that these requests will decrease the amount of time that you wait before being seen by our physicians.       _____________________________________________________________  Should you have questions after your visit to [facility], please contact our office at [phone] between the hours of 8:30 a.m. and 4:30 p.m.  Voicemails left after 4:30 p.m. will not be returned until the following business day.  For prescription refill requests, have your pharmacy contact our office with your prescription refill request.    _______________________________________________________________  We hope that we have given you very good care.  You may receive a patient satisfaction survey in the mail, please complete it and return it as soon as possible.  We value your feedback!  _______________________________________________________________  Have you asked  about our STAR program?  STAR stands for Survivorship Training and Rehabilitation, and this is a nationally recognized cancer care program that focuses on survivorship and rehabilitation.  Cancer and cancer treatments may cause problems, such as, pain, making you feel tired and keeping you from doing the things that you need or want to do. Cancer rehabilitation can help. Our goal is to reduce these troubling effects and help you have the best quality of life possible.  You may receive a survey from a nurse that asks questions about your current state of health.  Based on the survey results, all eligible patients will be referred to the [facility] program for an evaluation so we can better serve you!  A frequently asked questions sheet is available upon request. Ferumoxytol injection What is this medicine? FERUMOXYTOL is an iron complex. Iron is used to make healthy red blood cells, which carry oxygen and nutrients throughout the body. This medicine is used to treat iron deficiency anemia in people with chronic kidney disease. This medicine may be used for other purposes; ask your health care provider or pharmacist if you have questions. COMMON BRAND NAME(S): Feraheme What should I tell my health care provider before I take this medicine? They need to know if you have any of these conditions: -anemia not caused by low iron levels -high levels of iron in the blood -magnetic resonance imaging (MRI) test scheduled -an unusual or allergic reaction to iron, other medicines, foods, dyes, or preservatives -pregnant or trying to get pregnant -breast-feeding How should I use this medicine? This medicine is for injection into a vein. It is given by a health care professional in a hospital or clinic  setting. Talk to your pediatrician regarding the use of this medicine in children. Special care may be needed. Overdosage: If you think you've taken too much of this medicine contact a poison control center or  emergency room at once. Overdosage: If you think you have taken too much of this medicine contact a poison control center or emergency room at once. NOTE: This medicine is only for you. Do not share this medicine with others. What if I miss a dose? It is important not to miss your dose. Call your doctor or health care professional if you are unable to keep an appointment. What may interact with this medicine? This medicine may interact with the following medications: -other iron products This list may not describe all possible interactions. Give your health care provider a list of all the medicines, herbs, non-prescription drugs, or dietary supplements you use. Also tell them if you smoke, drink alcohol, or use illegal drugs. Some items may interact with your medicine. What should I watch for while using this medicine? Visit your doctor or healthcare professional regularly. Tell your doctor or healthcare professional if your symptoms do not start to get better or if they get worse. You may need blood work done while you are taking this medicine. You may need to follow a special diet. Talk to your doctor. Foods that contain iron include: whole grains/cereals, dried fruits, beans, or peas, leafy green vegetables, and organ meats (liver, kidney). What side effects may I notice from receiving this medicine? Side effects that you should report to your doctor or health care professional as soon as possible: -allergic reactions like skin rash, itching or hives, swelling of the face, lips, or tongue -breathing problems -changes in blood pressure -feeling faint or lightheaded, falls -fever or chills -flushing, sweating, or hot feelings -swelling of the ankles or feet Side effects that usually do not require medical attention (Report these to your doctor or health care professional if they continue or are bothersome.): -diarrhea -headache -nausea, vomiting -stomach pain This list may not describe all  possible side effects. Call your doctor for medical advice about side effects. You may report side effects to FDA at 1-800-FDA-1088. Where should I keep my medicine? This drug is given in a hospital or clinic and will not be stored at home. NOTE: This sheet is a summary. It may not cover all possible information. If you have questions about this medicine, talk to your doctor, pharmacist, or health care provider.  2015, [employer] Standard. (2011-12-05 15:23:36)

## 2014-03-16 ENCOUNTER — Telehealth (HOSPITAL_COMMUNITY): Payer: Self-pay | Admitting: Hematology and Oncology

## 2014-03-16 NOTE — Telephone Encounter (Signed)
AUTH NOT NEEDED FOR FERAHEME [address] @ [place] [phone]

## 2014-03-21 ENCOUNTER — Encounter: Payer: Self-pay | Admitting: *Deleted

## 2014-03-21 ENCOUNTER — Other Ambulatory Visit: Payer: Self-pay | Admitting: *Deleted

## 2014-03-21 DIAGNOSIS — D509 Iron deficiency anemia, unspecified: Secondary | ICD-10-CM

## 2014-04-06 ENCOUNTER — Other Ambulatory Visit (HOSPITAL_COMMUNITY): Payer: Self-pay

## 2014-04-06 DIAGNOSIS — D509 Iron deficiency anemia, unspecified: Secondary | ICD-10-CM

## 2014-04-12 ENCOUNTER — Ambulatory Visit (HOSPITAL_COMMUNITY): Payer: BC Managed Care – PPO

## 2014-04-12 ENCOUNTER — Other Ambulatory Visit (HOSPITAL_COMMUNITY): Payer: BC Managed Care – PPO

## 2014-04-17 ENCOUNTER — Encounter (HOSPITAL_COMMUNITY): Payer: Self-pay

## 2014-04-24 ENCOUNTER — Other Ambulatory Visit: Payer: Self-pay | Admitting: Gastroenterology

## 2014-05-02 ENCOUNTER — Encounter: Payer: Self-pay | Admitting: Internal Medicine

## 2014-05-06 ENCOUNTER — Encounter (HOSPITAL_COMMUNITY): Payer: Self-pay | Admitting: Oncology

## 2014-05-06 NOTE — Progress Notes (Signed)
[name], MD [place] [address]  IDA (iron deficiency anemia) - Plan: Anti-parietal antibody, Intrinsic Factor Antibodies, CBC with Differential, Iron and TIBC, Ferritin, CBC with Differential, Iron and TIBC, Ferritin, ferumoxytol (FERAHEME) 510 mg in sodium chloride 0.9 % 100 mL IVPB, ferumoxytol (FERAHEME) 510 mg in sodium chloride 0.9 % 100 mL IVPB  H/O gastric bypass - Plan: Vitamin D 25 hydroxy, Copper, serum, Vitamin B12, Folate, ferumoxytol (FERAHEME) 510 mg in sodium chloride 0.9 % 100 mL IVPB, ferumoxytol (FERAHEME) 510 mg in sodium chloride 0.9 % 100 mL IVPB  Other fatigue - Plan: Comprehensive metabolic panel, Comprehensive metabolic panel  CURRENT THERAPY: S/P IV Feraheme 510 mg on 10/29 and 03/09/2014.  INTERVAL HISTORY: Molly Berry 49 y.o. female returns for followup of iron deficiency anemia secondary to malabsorption after undergoing gastric bypass surgery at [facility] in 2011 with a resultant weight loss of 160 lbs, but gaining back 50 lbs.    I personally reviewed and went over laboratory results with the patient.  The results are noted within this dictation.  Her hemoglobin is at the lower limits of normal 12.2 g/dL, but her ferritin is only 59. I provided the patient education regarding iron deficiency anemia. Hers is likely secondary from her gastric bypass. Due to her gastric bypass surgery, we'll monitor other vitamins including vitamin D and copper level.  She notes a resolution of ice craving.  Hematologically, the patient denies any complaints and review of systems questioning is negative.  She reports that she's had CTs of chest in the past at [facility] and she reports that she was told she has nodules in her lungs. She wonders if she needs follow-up x-rays. Using care everywhere, I reviewed her imaging scans. There is no mention of nodules, but there are mentions of opacity and atelectasis  secondary to infectious process. She also has some small vessel disease associated with asthma. I do not see any mention of nodules or lesions and therefore I will defer any follow-up imaging to her physicians at [place].  She is agreeable to the plan above which will include another dose of 510 mg of IV Feraheme on days 1 and 8 to get her ferritin above 100 and hopefully improve her hemoglobin further.   Past Medical History  Diagnosis Date  . Depression   . Fibromyalgia   . Diabetes mellitus   . PONV (postoperative nausea and vomiting)   . Asthma   . Endometrial cancer   . Uterine cancer 12/26/2011    Stage 1, grade 1, S/P hysterectomy initially by robotic technique and then salpingo-oophorectomy at [facility] receiving no postoperative treatment.  . Thyroid disease   . Hypothyroidism   . Multiple thyroid nodules   . GERD (gastroesophageal reflux disease)   . Ulcer   . Pulmonary nodules   . COPD (chronic obstructive pulmonary disease)   . 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     is allergic to bee venom; cephalexin; vancomycin; penicillins; adhesive; and latex.  Ms. Chisholm had no medications administered during this visit.  Past Surgical History  Procedure Laterality Date  . Gastric bypass  2013    [facility]  . Cholecystectomy    .  Dilation and curettage of uterus  12 yrs ago  . Hysteroscopy w/d&c  06/18/2011    Procedure: DILATATION AND CURETTAGE /HYSTEROSCOPY;  Surgeon: Florian Buff, MD;  Location: [facility];  Service: Gynecology;  Laterality: N/A;  . Dilation and curettage of uterus  06/18/2011    Procedure: DILATATION AND CURETTAGE;  Surgeon: Florian Buff, MD;  Location: [facility];  Service: Gynecology;  Laterality:  N/A;  Suction Dilation and Curettage  . Abdominal hysterectomy    . Esophagogastroduodenoscopy N/A 10/12/2013    Dr. Rourk:anastomotic ulcer likely cause of bleeding. likely ischemic   . Lithotripsy    . Esophagogastroduodenoscopy (egd) with propofol N/A 01/19/2014    Procedure: ESOPHAGOGASTRODUODENOSCOPY (EGD) WITH PROPOFOL;  Surgeon: Daneil Dolin, MD;  Location: [facility];  Service: Endoscopy;  Laterality: N/A;  . Colonoscopy with propofol N/A 01/19/2014    Procedure: COLONOSCOPY WITH PROPOFOL;  Surgeon: Daneil Dolin, MD;  Location: [facility];  Service: Endoscopy;  Laterality: N/A;  in cecum at 1004; withdrawal time 9 minutes    Denies any headaches, dizziness, double vision, fevers, chills, night sweats, nausea, vomiting, diarrhea, constipation, chest pain, heart palpitations, shortness of breath, blood in stool, black tarry stool, urinary pain, urinary burning, urinary frequency, hematuria.   PHYSICAL EXAMINATION  ECOG PERFORMANCE STATUS: 1 - Symptomatic but completely ambulatory  Filed Vitals:   05/09/14 1152  BP: 174/94  Pulse: 90  Temp: 98.2 F (36.8 C)  Resp: 18    GENERAL:alert, no distress, well nourished, well developed, comfortable, cooperative, obese, smiling and malodorous body odor SKIN: skin color, texture, turgor are normal, no rashes or significant lesions HEAD: Normocephalic, No masses, lesions, tenderness or abnormalities EYES: normal, PERRLA, EOMI, Conjunctiva are pink and non-injected EARS: External ears normal OROPHARYNX:mucous membranes are moist  NECK: supple, no adenopathy, trachea midline LYMPH:  no palpable lymphadenopathy BREAST:not examined LUNGS: clear to auscultation  HEART: regular rate & rhythm ABDOMEN:abdomen soft, obese and normal bowel sounds BACK: Back symmetric, no curvature. EXTREMITIES:less then 2 second capillary refill, no joint deformities, effusion, or inflammation, no cyanosis  NEURO: alert & oriented x 3 with fluent speech, no focal  motor/sensory deficits, gait normal   LABORATORY DATA: CBC    Component Value Date/Time   WBC 5.5 05/08/2014 1233   RBC 4.67 05/08/2014 1233   HGB 12.2 05/08/2014 1233   HCT 40.2 05/08/2014 1233   PLT 359 05/08/2014 1233   MCV 86.1 05/08/2014 1233   MCH 26.1 05/08/2014 1233   MCHC 30.3 05/08/2014 1233   RDW 17.0* 05/08/2014 1233   LYMPHSABS 1.7 02/28/2014 1535   MONOABS 0.4 02/28/2014 1535   EOSABS 0.3 02/28/2014 1535   BASOSABS 0.0 02/28/2014 1535      Chemistry      Component Value Date/Time   NA 141 05/08/2014 1233   K 3.0* 05/08/2014 1233   CL 104 05/08/2014 1233   CO2 29 05/08/2014 1233   BUN 10 05/08/2014 1233   CREATININE 0.81 05/08/2014 1233      Component Value Date/Time   CALCIUM 8.4 05/08/2014 1233   ALKPHOS 101 05/08/2014 1233   AST 18 05/08/2014 1233   ALT 12 05/08/2014 1233   BILITOT 0.3 05/08/2014 1233     Lab Results  Component Value Date   IRON 20* 02/28/2014   TIBC 428 02/28/2014   FERRITIN 59 05/08/2014      ASSESSMENT AND PLAN:  IDA (iron deficiency anemia) Secondary to malabsorption after undergoing gastric bypass surgery at [facility] in 2011.  S/P IV Feraheme 510 mg on 03/02/2014 and 03/09/2014.  Ferritin on 05/08/2014 is less than 100 with a normal Hgb.  Will set patient up for IV Feraheme 510 mg on days 1 and 8 in the near future to get ferritin >100.  Labs in 6 weeks and 12 weeks: CBC diff, iron/TIBC, ferritin.  H/O gastric bypass Will monitor other vitamins to rule out deficiencies periodically.  Labs in 6 weeks: serum copper level and vit D level.  Labs in 12 weeks: Folate and Vit B12.   THERAPY PLAN:  We will continue to monitor her blood counts and iron studies.  We are prepared to administer IV iron when needed.  All questions were answered. The patient knows to call the clinic with any problems, questions or concerns. We can certainly see the patient much sooner if necessary.  Patient and plan discussed with Dr. Ancil Linsey and  she is in agreement with the aforementioned.   Ligia Duguay 05/09/2014

## 2014-05-06 NOTE — Assessment & Plan Note (Addendum)
Secondary to malabsorption after undergoing gastric bypass surgery at [facility] in 2011.  S/P IV Feraheme 510 mg on 03/02/2014 and 03/09/2014.  Ferritin on 05/08/2014 is less than 100 with a normal Hgb.  Will set patient up for IV Feraheme 510 mg on days 1 and 8 in the near future to get ferritin >100.  Labs in 6 weeks and 12 weeks: CBC diff, iron/TIBC, ferritin.

## 2014-05-08 ENCOUNTER — Encounter (HOSPITAL_COMMUNITY): Payer: Self-pay | Attending: Hematology and Oncology

## 2014-05-08 DIAGNOSIS — D509 Iron deficiency anemia, unspecified: Secondary | ICD-10-CM | POA: Insufficient documentation

## 2014-05-08 DIAGNOSIS — K909 Intestinal malabsorption, unspecified: Secondary | ICD-10-CM

## 2014-05-08 LAB — CBC
HCT: 40.2 % (ref 36.0–46.0)
Hemoglobin: 12.2 g/dL (ref 12.0–15.0)
MCH: 26.1 pg (ref 26.0–34.0)
MCHC: 30.3 g/dL (ref 30.0–36.0)
MCV: 86.1 fL (ref 78.0–100.0)
Platelets: 359 K/uL (ref 150–400)
RBC: 4.67 MIL/uL (ref 3.87–5.11)
RDW: 17 % — ABNORMAL HIGH (ref 11.5–15.5)
WBC: 5.5 K/uL (ref 4.0–10.5)

## 2014-05-08 NOTE — Progress Notes (Signed)
LABS FOR APARIETAL AB,IFAB,CBC,FERR

## 2014-05-09 ENCOUNTER — Encounter (HOSPITAL_COMMUNITY): Payer: Self-pay | Admitting: Oncology

## 2014-05-09 ENCOUNTER — Encounter (HOSPITAL_BASED_OUTPATIENT_CLINIC_OR_DEPARTMENT_OTHER): Payer: Self-pay | Admitting: Oncology

## 2014-05-09 VITALS — BP 174/94 | HR 90 | Temp 98.2°F | Resp 18 | Wt 278.6 lb

## 2014-05-09 DIAGNOSIS — D509 Iron deficiency anemia, unspecified: Secondary | ICD-10-CM

## 2014-05-09 DIAGNOSIS — Z9884 Bariatric surgery status: Secondary | ICD-10-CM

## 2014-05-09 DIAGNOSIS — R5383 Other fatigue: Secondary | ICD-10-CM

## 2014-05-09 DIAGNOSIS — D500 Iron deficiency anemia secondary to blood loss (chronic): Secondary | ICD-10-CM

## 2014-05-09 HISTORY — DX: Bariatric surgery status: Z98.84

## 2014-05-09 LAB — FERRITIN: Ferritin: 59 ng/mL (ref 10–291)

## 2014-05-09 LAB — COMPREHENSIVE METABOLIC PANEL
ALK PHOS: 101 U/L (ref 39–117)
ALT: 12 U/L (ref 0–35)
AST: 18 U/L (ref 0–37)
Albumin: 3.8 g/dL (ref 3.5–5.2)
Anion gap: 8 (ref 5–15)
BUN: 10 mg/dL (ref 6–23)
CO2: 29 mmol/L (ref 19–32)
Calcium: 8.4 mg/dL (ref 8.4–10.5)
Chloride: 104 mEq/L (ref 96–112)
Creatinine, Ser: 0.81 mg/dL (ref 0.50–1.10)
GFR calc non Af Amer: 85 mL/min — ABNORMAL LOW (ref >90)
GLUCOSE: 126 mg/dL — AB (ref 70–99)
Potassium: 3 mmol/L — ABNORMAL LOW (ref 3.5–5.1)
SODIUM: 141 mmol/L (ref 135–145)
Total Bilirubin: 0.3 mg/dL (ref 0.3–1.2)
Total Protein: 6.8 g/dL (ref 6.0–8.3)

## 2014-05-09 NOTE — Assessment & Plan Note (Signed)
Will monitor other vitamins to rule out deficiencies periodically.  Labs in 6 weeks: serum copper level and vit D level.  Labs in 12 weeks: Folate and Vit B12.

## 2014-05-09 NOTE — Patient Instructions (Signed)
[place] Discharge Instructions  RECOMMENDATIONS MADE BY THE CONSULTANT AND ANY TEST RESULTS WILL BE SENT TO YOUR REFERRING PHYSICIAN.  Labs today are better.  However, your iron level is 59 and we would like that at 100 or greater.   Lab results follow below.  Therefore, we will get you set up for IV iron again in the near future.  You will get two doses, just as before.  We will monitor your labs and repeat your iron test 6 and 12 week after your iron infusion. Return in 12 weeks for follow-up.  Please call sooner with any questions or concerns related to your iron deficiency.   Happy New Year.   CBC    Component Value Date/Time   WBC 5.5 05/08/2014 1233   RBC 4.67 05/08/2014 1233   HGB 12.2 05/08/2014 1233   HCT 40.2 05/08/2014 1233   PLT 359 05/08/2014 1233   MCV 86.1 05/08/2014 1233   MCH 26.1 05/08/2014 1233   MCHC 30.3 05/08/2014 1233   RDW 17.0* 05/08/2014 1233   LYMPHSABS 1.7 02/28/2014 1535   MONOABS 0.4 02/28/2014 1535   EOSABS 0.3 02/28/2014 1535   BASOSABS 0.0 02/28/2014 1535    Lab Results  Component Value Date   FERRITIN 59 05/08/2014     Thank you for choosing [place] to provide your oncology and hematology care.  To afford each patient quality time with our providers, please arrive at least 15 minutes before your scheduled appointment time.  With your help, our goal is to use those 15 minutes to complete the necessary work-up to ensure our physicians have the information they need to help with your evaluation and healthcare recommendations.    Effective January 1st, 2014, we ask that you re-schedule your appointment with our physicians should you arrive 10 or more minutes late for your appointment.  We strive to give you quality time with our providers, and arriving late affects you and other patients whose appointments are after yours.    Again, thank you for choosing [facility].  Our hope is that these  requests will decrease the amount of time that you wait before being seen by our physicians.       _____________________________________________________________  Should you have questions after your visit to [facility], please contact our office at [phone] between the hours of 8:30 a.m. and 5:00 p.m.  Voicemails left after 4:30 p.m. will not be returned until the following business day.  For prescription refill requests, have your pharmacy contact our office with your prescription refill request.     Iron Deficiency Anemia Anemia is a condition in which there are less red blood cells or hemoglobin in the blood than normal. Hemoglobin is the part of red blood cells that carries oxygen. Iron deficiency anemia is anemia caused by too little iron. It is the most common type of anemia. It may leave you tired and short of breath. CAUSES   Lack of iron in the diet.  Poor absorption of iron, as seen with intestinal disorders.  Intestinal bleeding.  Heavy periods. SIGNS AND SYMPTOMS  Mild anemia may not be noticeable. Symptoms may include:  Fatigue.  Headache.  Pale skin.  Weakness.  Tiredness.  Shortness of breath.  Dizziness.  Cold hands and feet.  Fast or irregular heartbeat. DIAGNOSIS  Diagnosis requires a thorough evaluation and physical exam by your health care provider. Blood tests are generally used to confirm  iron deficiency anemia. Additional tests may be done to find the underlying cause of your anemia. These may include:  Testing for blood in the stool (fecal occult blood test).  A procedure to see inside the colon and rectum (colonoscopy).  A procedure to see inside the esophagus and stomach (endoscopy). TREATMENT  Iron deficiency anemia is treated by correcting the cause of the deficiency. Treatment may involve:  Adding iron-rich foods to your diet.  Taking iron supplements. Pregnant or breastfeeding women need to take extra iron because  their normal diet usually does not provide the required amount.  Taking vitamins. Vitamin C improves the absorption of iron. Your health care provider may recommend that you take your iron tablets with a glass of orange juice or vitamin C supplement.  Medicines to make heavy menstrual flow lighter.  Surgery. HOME CARE INSTRUCTIONS   Take iron as directed by your health care provider.  If you cannot tolerate taking iron supplements by mouth, talk to your health care provider about taking them through a vein (intravenously) or an injection into a muscle.  For the best iron absorption, iron supplements should be taken on an empty stomach. If you cannot tolerate them on an empty stomach, you may need to take them with food.  Do not drink milk or take antacids at the same time as your iron supplements. Milk and antacids may interfere with the absorption of iron.  Iron supplements can cause constipation. Make sure to include fiber in your diet to prevent constipation. A stool softener may also be recommended.  Take vitamins as directed by your health care provider.  Eat a diet rich in iron. Foods high in iron include liver, lean beef, whole-grain bread, eggs, dried fruit, and dark green leafy vegetables. SEEK IMMEDIATE MEDICAL CARE IF:   You faint. If this happens, do not drive. Call your local emergency services (911 in [place] if no other help is available.  You have chest pain.  You feel nauseous or vomit.  You have severe or increased shortness of breath with activity.  You feel weak.  You have a rapid heartbeat.  You have unexplained sweating.  You become light-headed when getting up from a chair or bed. MAKE SURE YOU:   Understand these instructions.  Will watch your condition.  Will get help right away if you are not doing well or get worse. Document Released: 04/18/2000 Document Revised: 04/26/2013 Document Reviewed: 12/27/2012 [facility] Patient Information 2015  [place], [place]. This information is not intended to replace advice given to you by your health care provider. Make sure you discuss any questions you have with your health care provider.

## 2014-05-10 LAB — INTRINSIC FACTOR ANTIBODIES: Intrinsic Factor: NEGATIVE

## 2014-05-11 LAB — ANTI-PARIETAL ANTIBODY: Parietal Cell Antibody-IgG: NEGATIVE

## 2014-05-16 ENCOUNTER — Encounter (HOSPITAL_COMMUNITY): Payer: Self-pay

## 2014-05-16 ENCOUNTER — Encounter (HOSPITAL_BASED_OUTPATIENT_CLINIC_OR_DEPARTMENT_OTHER): Payer: Self-pay

## 2014-05-16 VITALS — BP 128/66 | HR 72 | Temp 98.6°F | Resp 18

## 2014-05-16 DIAGNOSIS — D508 Other iron deficiency anemias: Secondary | ICD-10-CM

## 2014-05-16 DIAGNOSIS — D509 Iron deficiency anemia, unspecified: Secondary | ICD-10-CM

## 2014-05-16 DIAGNOSIS — Z9884 Bariatric surgery status: Secondary | ICD-10-CM

## 2014-05-16 DIAGNOSIS — K912 Postsurgical malabsorption, not elsewhere classified: Secondary | ICD-10-CM

## 2014-05-16 MED ORDER — SODIUM CHLORIDE 0.9 % IV SOLN
510.0000 mg | Freq: Once | INTRAVENOUS | Status: AC
  Administered 2014-05-16: 510 mg via INTRAVENOUS
  Filled 2014-05-16: qty 17

## 2014-05-16 MED ORDER — SODIUM CHLORIDE 0.9 % IJ SOLN
10.0000 mL | Freq: Once | INTRAMUSCULAR | Status: AC
  Administered 2014-05-16: 10 mL via INTRAVENOUS

## 2014-05-16 MED ORDER — SODIUM CHLORIDE 0.9 % IV SOLN
Freq: Once | INTRAVENOUS | Status: AC
  Administered 2014-05-16: 12:00:00 via INTRAVENOUS

## 2014-05-16 NOTE — Progress Notes (Signed)
Tolerated well

## 2014-05-23 ENCOUNTER — Ambulatory Visit (HOSPITAL_COMMUNITY): Payer: Self-pay

## 2014-05-25 ENCOUNTER — Encounter (HOSPITAL_COMMUNITY): Payer: Self-pay

## 2014-05-25 ENCOUNTER — Encounter (HOSPITAL_BASED_OUTPATIENT_CLINIC_OR_DEPARTMENT_OTHER): Payer: Self-pay

## 2014-05-25 VITALS — BP 143/80 | HR 81 | Temp 98.2°F | Resp 18

## 2014-05-25 DIAGNOSIS — Z9884 Bariatric surgery status: Secondary | ICD-10-CM

## 2014-05-25 DIAGNOSIS — K909 Intestinal malabsorption, unspecified: Secondary | ICD-10-CM

## 2014-05-25 DIAGNOSIS — D509 Iron deficiency anemia, unspecified: Secondary | ICD-10-CM

## 2014-05-25 MED ORDER — SODIUM CHLORIDE 0.9 % IV SOLN
INTRAVENOUS | Status: DC
  Administered 2014-05-25: 14:00:00 via INTRAVENOUS

## 2014-05-25 MED ORDER — FERUMOXYTOL INJECTION 510 MG/17 ML
510.0000 mg | Freq: Once | INTRAVENOUS | Status: AC
  Administered 2014-05-25: 510 mg via INTRAVENOUS
  Filled 2014-05-25: qty 17

## 2014-05-25 NOTE — Progress Notes (Signed)
Molly Berry Tolerated iron infusion Well today.  Discharged ambulatory

## 2014-05-25 NOTE — Patient Instructions (Signed)
[place] at [facility]  Discharge Instructions:  You had an iron infusion today.  Follow up as scheduled call the clinic if you have any questions or concerns _______________________________________________________________  Thank you for choosing [place] at [facility] to provide your oncology and hematology care.  To afford each patient quality time with our providers, please arrive at least 15 minutes before your scheduled appointment.  You need to re-schedule your appointment if you arrive 10 or more minutes late.  We strive to give you quality time with our providers, and arriving late affects you and other patients whose appointments are after yours.  Also, if you no show three or more times for appointments you may be dismissed from the clinic.  Again, thank you for choosing [place] at [place] hope is that these requests will allow you access to exceptional care and in a timely manner. _______________________________________________________________  If you have questions after your visit, please contact our office at [phone] between the hours of 8:30 a.m. and 5:00 p.m. Voicemails left after 4:30 p.m. will not be returned until the following business day. _______________________________________________________________  For prescription refill requests, have your pharmacy contact our office. _______________________________________________________________  Recommendations made by the consultant and any test results will be sent to your referring physician. _______________________________________________________________

## 2014-05-26 ENCOUNTER — Ambulatory Visit (HOSPITAL_COMMUNITY): Payer: Self-pay

## 2014-06-20 ENCOUNTER — Encounter (HOSPITAL_COMMUNITY): Payer: Self-pay

## 2014-06-21 ENCOUNTER — Encounter (HOSPITAL_COMMUNITY): Payer: Self-pay

## 2014-06-21 ENCOUNTER — Encounter (HOSPITAL_COMMUNITY): Payer: BLUE CROSS/BLUE SHIELD | Attending: Hematology and Oncology

## 2014-06-21 DIAGNOSIS — D509 Iron deficiency anemia, unspecified: Secondary | ICD-10-CM

## 2014-06-21 DIAGNOSIS — Z9884 Bariatric surgery status: Secondary | ICD-10-CM

## 2014-06-21 DIAGNOSIS — D508 Other iron deficiency anemias: Secondary | ICD-10-CM

## 2014-06-21 LAB — CBC WITH DIFFERENTIAL/PLATELET
Basophils Absolute: 0 10*3/uL (ref 0.0–0.1)
Basophils Relative: 1 % (ref 0–1)
Eosinophils Absolute: 0.2 10*3/uL (ref 0.0–0.7)
Eosinophils Relative: 3 % (ref 0–5)
HCT: 44.8 % (ref 36.0–46.0)
HEMOGLOBIN: 14 g/dL (ref 12.0–15.0)
LYMPHS ABS: 1.3 10*3/uL (ref 0.7–4.0)
LYMPHS PCT: 20 % (ref 12–46)
MCH: 28.6 pg (ref 26.0–34.0)
MCHC: 31.3 g/dL (ref 30.0–36.0)
MCV: 91.4 fL (ref 78.0–100.0)
MONOS PCT: 5 % (ref 3–12)
Monocytes Absolute: 0.4 10*3/uL (ref 0.1–1.0)
NEUTROS PCT: 71 % (ref 43–77)
Neutro Abs: 4.8 10*3/uL (ref 1.7–7.7)
Platelets: 316 10*3/uL (ref 150–400)
RBC: 4.9 MIL/uL (ref 3.87–5.11)
RDW: 14.4 % (ref 11.5–15.5)
WBC: 6.7 10*3/uL (ref 4.0–10.5)

## 2014-06-21 LAB — FERRITIN: FERRITIN: 394 ng/mL — AB (ref 10–291)

## 2014-06-21 LAB — IRON AND TIBC
Iron: 92 ug/dL (ref 42–145)
SATURATION RATIOS: 30 % (ref 20–55)
TIBC: 305 ug/dL (ref 250–470)
UIBC: 213 ug/dL (ref 125–400)

## 2014-06-21 NOTE — Progress Notes (Signed)
Lab draw

## 2014-06-22 LAB — VITAMIN D 25 HYDROXY (VIT D DEFICIENCY, FRACTURES): Vit D, 25-Hydroxy: 20.6 ng/mL — ABNORMAL LOW (ref 30.0–100.0)

## 2014-06-24 LAB — COPPER, SERUM: COPPER: 130 ug/dL (ref 70–175)

## 2014-06-28 ENCOUNTER — Other Ambulatory Visit (HOSPITAL_COMMUNITY): Payer: Self-pay | Admitting: Oncology

## 2014-06-28 DIAGNOSIS — E559 Vitamin D deficiency, unspecified: Secondary | ICD-10-CM

## 2014-06-28 MED ORDER — ERGOCALCIFEROL 1.25 MG (50000 UT) PO CAPS: 50000.0000 [IU] | ORAL_CAPSULE | ORAL | Status: DC

## 2014-07-27 ENCOUNTER — Encounter: Payer: Self-pay | Admitting: Neurology

## 2014-07-27 ENCOUNTER — Ambulatory Visit (INDEPENDENT_AMBULATORY_CARE_PROVIDER_SITE_OTHER): Payer: BLUE CROSS/BLUE SHIELD | Admitting: Neurology

## 2014-07-27 VITALS — BP 148/88 | HR 82 | Ht 63.25 in | Wt 272.4 lb

## 2014-07-27 DIAGNOSIS — R269 Unspecified abnormalities of gait and mobility: Secondary | ICD-10-CM

## 2014-07-27 DIAGNOSIS — R202 Paresthesia of skin: Secondary | ICD-10-CM | POA: Diagnosis not present

## 2014-07-27 DIAGNOSIS — E0842 Diabetes mellitus due to underlying condition with diabetic polyneuropathy: Secondary | ICD-10-CM | POA: Diagnosis not present

## 2014-07-27 DIAGNOSIS — G3184 Mild cognitive impairment, so stated: Secondary | ICD-10-CM | POA: Diagnosis not present

## 2014-07-27 NOTE — Progress Notes (Signed)
Done
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
## 2014-07-27 NOTE — Patient Instructions (Addendum)
We will schedule you for EMG of the arms and call you with the results of it. I would recommend that you start using a cane, especially since you are falling frequently.  If this worsen, you may benefit from physical therapy for gait training.

## 2014-08-01 ENCOUNTER — Encounter (HOSPITAL_COMMUNITY): Payer: Self-pay | Attending: Hematology and Oncology

## 2014-08-01 DIAGNOSIS — D509 Iron deficiency anemia, unspecified: Secondary | ICD-10-CM | POA: Insufficient documentation

## 2014-08-08 ENCOUNTER — Encounter (HOSPITAL_BASED_OUTPATIENT_CLINIC_OR_DEPARTMENT_OTHER): Payer: BLUE CROSS/BLUE SHIELD

## 2014-08-08 ENCOUNTER — Encounter (HOSPITAL_COMMUNITY): Payer: BLUE CROSS/BLUE SHIELD | Attending: Hematology and Oncology | Admitting: Oncology

## 2014-08-08 ENCOUNTER — Encounter (HOSPITAL_COMMUNITY): Payer: Self-pay | Admitting: Oncology

## 2014-08-08 VITALS — BP 111/71 | HR 79 | Temp 98.7°F | Resp 18 | Wt 275.7 lb

## 2014-08-08 DIAGNOSIS — D509 Iron deficiency anemia, unspecified: Secondary | ICD-10-CM | POA: Insufficient documentation

## 2014-08-08 DIAGNOSIS — Z9884 Bariatric surgery status: Secondary | ICD-10-CM | POA: Diagnosis not present

## 2014-08-08 DIAGNOSIS — R070 Pain in throat: Secondary | ICD-10-CM | POA: Diagnosis not present

## 2014-08-08 DIAGNOSIS — D508 Other iron deficiency anemias: Secondary | ICD-10-CM

## 2014-08-08 DIAGNOSIS — K909 Intestinal malabsorption, unspecified: Secondary | ICD-10-CM | POA: Diagnosis not present

## 2014-08-08 LAB — CBC WITH DIFFERENTIAL/PLATELET
BASOS PCT: 1 % (ref 0–1)
Basophils Absolute: 0 10*3/uL (ref 0.0–0.1)
EOS ABS: 0.2 10*3/uL (ref 0.0–0.7)
EOS PCT: 4 % (ref 0–5)
HCT: 44.2 % (ref 36.0–46.0)
HEMOGLOBIN: 14 g/dL (ref 12.0–15.0)
LYMPHS ABS: 1.4 10*3/uL (ref 0.7–4.0)
Lymphocytes Relative: 21 % (ref 12–46)
MCH: 29.3 pg (ref 26.0–34.0)
MCHC: 31.7 g/dL (ref 30.0–36.0)
MCV: 92.5 fL (ref 78.0–100.0)
MONOS PCT: 6 % (ref 3–12)
Monocytes Absolute: 0.4 10*3/uL (ref 0.1–1.0)
NEUTROS PCT: 68 % (ref 43–77)
Neutro Abs: 4.8 10*3/uL (ref 1.7–7.7)
Platelets: 364 10*3/uL (ref 150–400)
RBC: 4.78 MIL/uL (ref 3.87–5.11)
RDW: 13.6 % (ref 11.5–15.5)
WBC: 6.9 10*3/uL (ref 4.0–10.5)

## 2014-08-08 LAB — IRON AND TIBC
IRON: 96 ug/dL (ref 42–145)
Saturation Ratios: 33 % (ref 20–55)
TIBC: 294 ug/dL (ref 250–470)
UIBC: 198 ug/dL (ref 125–400)

## 2014-08-08 NOTE — Progress Notes (Signed)
LABS DRAWN

## 2014-08-08 NOTE — Progress Notes (Signed)
Molly Morel, MD Molly Berry  IDA (iron deficiency anemia) - Plan: CBC with Differential, Iron and TIBC, Ferritin, CBC with Differential, Iron and TIBC, Ferritin  H/O gastric bypass - Plan: Vitamin B12, Folate, Vitamin D 25 hydroxy  CURRENT THERAPY: S/P IV Feraheme 510 mg on 1/12 and 05/25/2014.  INTERVAL HISTORY: Molly Berry 49 y.o. female returns for followup of iron deficiency anemia secondary to malabsorption after undergoing gastric bypass surgery at [facility] in 2011 with a resultant weight loss of 160 lbs, but gaining back weight.    I personally reviewed and went over laboratory results with the patient.  The results are noted within this dictation.  Her labs from Feb 2016 were solid.  She notes a sore throat that began today.  She denies any other symptoms.  Therefore she can take anything OTC she wishes for this symptom, and if it gets worse or fails to improve, she can call us on Friday AM with an update.  Hematologically, she denies any complaints and ROS questioning is negative.   Past Medical History  Diagnosis Date  . Depression   . Fibromyalgia   . Diabetes mellitus   . PONV (postoperative nausea and vomiting)   . Asthma   . Endometrial cancer   . Uterine cancer 12/26/2011    Stage 1, grade 1, S/P hysterectomy initially by robotic technique and then salpingo-oophorectomy at [facility] receiving no postoperative treatment.  . Thyroid disease   . Hypothyroidism   . Multiple thyroid nodules   . GERD (gastroesophageal reflux disease)   . Ulcer   . Pulmonary nodules   . COPD (chronic obstructive pulmonary disease)   . H/O gastric bypass 05/09/2014    At [facility]    has SOB (shortness of breath); Multiple pulmonary nodules; Abdominal pain, epigastric; Melena; Ureteral calculus, left; Anastomotic ulcer; Heme positive stool; IDA (iron deficiency anemia); Multiple thyroid nodules; and H/O gastric bypass on her problem list.       is allergic to bee venom; cephalexin; vancomycin; penicillins; adhesive; and latex.  Molly Berry had no medications administered during this visit.  Past Surgical History  Procedure Laterality Date  . Gastric bypass  2013    [facility]  . Cholecystectomy    . Dilation and curettage of uterus  12 yrs ago  . Hysteroscopy w/d&c  06/18/2011    Procedure: DILATATION AND CURETTAGE /HYSTEROSCOPY;  Surgeon: Florian Buff, MD;  Location: [facility];  Service: Gynecology;  Laterality: N/A;  . Dilation and curettage of uterus  06/18/2011    Procedure: DILATATION AND CURETTAGE;  Surgeon: Florian Buff, MD;  Location: [facility];  Service: Gynecology;  Laterality: N/A;  Suction Dilation and Curettage  . Abdominal hysterectomy    . Esophagogastroduodenoscopy N/A 10/12/2013    Dr. Rourk:anastomotic ulcer likely cause of bleeding. likely ischemic   . Lithotripsy    . Esophagogastroduodenoscopy (egd) with propofol N/A 01/19/2014    Procedure: ESOPHAGOGASTRODUODENOSCOPY (EGD) WITH PROPOFOL;  Surgeon: Daneil Dolin, MD;  Location: [facility];  Service: Endoscopy;  Laterality: N/A;  . Colonoscopy with propofol N/A 01/19/2014    Procedure: COLONOSCOPY WITH PROPOFOL;  Surgeon: Daneil Dolin, MD;  Location: [facility];  Service: Endoscopy;  Laterality: N/A;  in cecum at 1004; withdrawal time 9 minutes    Denies any headaches, dizziness, double vision, fevers, chills, night sweats, nausea, vomiting, diarrhea, constipation, chest pain, heart palpitations, shortness of breath, blood in stool, black tarry stool,  urinary pain, urinary burning, urinary frequency, hematuria.   PHYSICAL EXAMINATION  ECOG PERFORMANCE STATUS: 0 - Asymptomatic  Filed Vitals:   08/08/14 1227  BP: 111/71  Pulse: 79  Temp: 98.7 F (37.1 C)  Resp: 18    GENERAL:alert, no distress, well nourished, well developed, comfortable, cooperative, obese and smiling SKIN: skin color, texture, turgor are normal, no rashes or significant lesions HEAD:  Normocephalic, No masses, lesions, tenderness or abnormalities, facial hair EYES: normal, PERRLA, EOMI, Conjunctiva are pink and non-injected EARS: External ears normal OROPHARYNX:lips, buccal mucosa, and tongue normal and mucous membranes are moist  NECK: supple, no adenopathy, trachea midline LYMPH:  not examined BREAST:not examined LUNGS: clear to auscultation  HEART: regular rate & rhythm, no murmurs and no gallops ABDOMEN:abdomen soft and normal bowel sounds, obese BACK: Back symmetric, no curvature. EXTREMITIES:less then 2 second capillary refill, no joint deformities, effusion, or inflammation, no skin discoloration, no clubbing, no cyanosis  NEURO: alert & oriented x 3 with fluent speech, no focal motor/sensory deficits, gait normal   LABORATORY DATA: CBC    Component Value Date/Time   WBC 6.7 06/21/2014 1030   RBC 4.90 06/21/2014 1030   HGB 14.0 06/21/2014 1030   HCT 44.8 06/21/2014 1030   PLT 316 06/21/2014 1030   MCV 91.4 06/21/2014 1030   MCH 28.6 06/21/2014 1030   MCHC 31.3 06/21/2014 1030   RDW 14.4 06/21/2014 1030   LYMPHSABS 1.3 06/21/2014 1030   MONOABS 0.4 06/21/2014 1030   EOSABS 0.2 06/21/2014 1030   BASOSABS 0.0 06/21/2014 1030      Chemistry      Component Value Date/Time   NA 141 05/08/2014 1233   K 3.0* 05/08/2014 1233   CL 104 05/08/2014 1233   CO2 29 05/08/2014 1233   BUN 10 05/08/2014 1233   CREATININE 0.81 05/08/2014 1233      Component Value Date/Time   CALCIUM 8.4 05/08/2014 1233   ALKPHOS 101 05/08/2014 1233   AST 18 05/08/2014 1233   ALT 12 05/08/2014 1233   BILITOT 0.3 05/08/2014 1233     Lab Results  Component Value Date   IRON 92 06/21/2014   TIBC 305 06/21/2014   FERRITIN 394* 06/21/2014     ASSESSMENT AND PLAN:  IDA (iron deficiency anemia) Secondary to malabsorption after undergoing gastric bypass surgery at [facility] in 2011.    S/P IV Feraheme 510 mg on 05/16/2014 and 05/25/2014.    Ferritin on 2/17//2016  Was 394  with a normal Hgb.   Labs today: CBC diff, anemia panel.  Labs in 6 weeks and 12 weeks: CBC diff, iron/TIBC, ferritin.  Return in 12 weeks for follow-up  She notes a 6 hour history of sore throat.  She denies any other symptoms related to a URI.  She may take OTC medication and if progresses or does not improve, she will call us on Friday with an update.   THERAPY PLAN:  Will monitor labs and iron studies and provide IV iron support as needed to maintain a ferritin of 100 or greater.  All questions were answered. The patient knows to call the clinic with any problems, questions or concerns. We can certainly see the patient much sooner if necessary.  Patient and plan discussed with Dr. Ancil Linsey and she is in agreement with the aforementioned.   This note is electronically signed by: Robynn Pane 08/08/2014 12:59 PM

## 2014-08-08 NOTE — Patient Instructions (Signed)
[place] at [facility]  Discharge Instructions:  You saw Molly Berry today. We did lab work today.   Lab work in 6 weeks and 12 weeks. Return to see the doctor in 12 weeks.   Please call the clinic if you have any questions or concerns.  _______________________________________________________________  Thank you for choosing [place] at [facility] to provide your oncology and hematology care.  To afford each patient quality time with our providers, please arrive at least 15 minutes before your scheduled appointment.  You need to re-schedule your appointment if you arrive 10 or more minutes late.  We strive to give you quality time with our providers, and arriving late affects you and other patients whose appointments are after yours.  Also, if you no show three or more times for appointments you may be dismissed from the clinic.  Again, thank you for choosing [place] [facility] [place] hope is that these requests will allow you access to exceptional care and in a timely manner. _______________________________________________________________  If you have questions after your visit, please contact our office at [phone] between the hours of 8:30 a.m. and 5:00 p.m. Voicemails left after 4:30 p.m. will not be returned until the following business day. _______________________________________________________________  For prescription refill requests, have your pharmacy contact our office. _______________________________________________________________  Recommendations made by the consultant and any test results will be sent to your referring physician. _______________________________________________________________

## 2014-08-08 NOTE — Assessment & Plan Note (Addendum)
Secondary to malabsorption after undergoing gastric bypass surgery at [facility] in 2011.    S/P IV Feraheme 510 mg on 05/16/2014 and 05/25/2014.    Ferritin on 2/17//2016  Was 394 with a normal Hgb.   Labs today: CBC diff, anemia panel.  Labs in 6 weeks and 12 weeks: CBC diff, iron/TIBC, ferritin.  Return in 12 weeks for follow-up  She notes a 6 hour history of sore throat.  She denies any other symptoms related to a URI.  She may take OTC medication and if progresses or does not improve, she will call us on Friday with an update.

## 2014-08-09 LAB — FOLATE: Folate: 9.1 ng/mL

## 2014-08-09 LAB — VITAMIN B12: VITAMIN B 12: 620 pg/mL (ref 211–911)

## 2014-08-09 LAB — FERRITIN: FERRITIN: 278 ng/mL (ref 10–291)

## 2014-08-24 ENCOUNTER — Encounter: Payer: BLUE CROSS/BLUE SHIELD | Admitting: Neurology

## 2014-09-03 ENCOUNTER — Other Ambulatory Visit (HOSPITAL_COMMUNITY): Payer: Self-pay | Admitting: Oncology

## 2014-09-08 ENCOUNTER — Other Ambulatory Visit (HOSPITAL_COMMUNITY): Payer: Self-pay | Admitting: Hematology & Oncology

## 2014-09-08 ENCOUNTER — Encounter (HOSPITAL_COMMUNITY): Payer: BLUE CROSS/BLUE SHIELD | Attending: Hematology & Oncology

## 2014-09-08 DIAGNOSIS — D508 Other iron deficiency anemias: Secondary | ICD-10-CM | POA: Diagnosis not present

## 2014-09-08 DIAGNOSIS — D509 Iron deficiency anemia, unspecified: Secondary | ICD-10-CM

## 2014-09-08 LAB — CBC WITH DIFFERENTIAL/PLATELET
BASOS PCT: 0 % (ref 0–1)
Basophils Absolute: 0 10*3/uL (ref 0.0–0.1)
Eosinophils Absolute: 0.3 10*3/uL (ref 0.0–0.7)
Eosinophils Relative: 4 % (ref 0–5)
HEMATOCRIT: 44.8 % (ref 36.0–46.0)
Hemoglobin: 14.1 g/dL (ref 12.0–15.0)
Lymphocytes Relative: 22 % (ref 12–46)
Lymphs Abs: 1.5 10*3/uL (ref 0.7–4.0)
MCH: 29.3 pg (ref 26.0–34.0)
MCHC: 31.5 g/dL (ref 30.0–36.0)
MCV: 92.9 fL (ref 78.0–100.0)
MONO ABS: 0.3 10*3/uL (ref 0.1–1.0)
MONOS PCT: 4 % (ref 3–12)
Neutro Abs: 4.7 10*3/uL (ref 1.7–7.7)
Neutrophils Relative %: 70 % (ref 43–77)
Platelets: 331 10*3/uL (ref 150–400)
RBC: 4.82 MIL/uL (ref 3.87–5.11)
RDW: 13 % (ref 11.5–15.5)
WBC: 6.7 10*3/uL (ref 4.0–10.5)

## 2014-09-08 LAB — IRON AND TIBC
IRON: 63 ug/dL (ref 28–170)
SATURATION RATIOS: 19 % (ref 10.4–31.8)
TIBC: 332 ug/dL (ref 250–450)
UIBC: 269 ug/dL

## 2014-09-08 LAB — FERRITIN: Ferritin: 139 ng/mL (ref 11–307)

## 2014-09-11 NOTE — Progress Notes (Signed)
Labs drawn

## 2014-09-15 ENCOUNTER — Telehealth (HOSPITAL_COMMUNITY): Payer: Self-pay

## 2014-09-15 NOTE — Telephone Encounter (Signed)
No, that is incorrect.  I said we will maintain her ferritin above 100.  I cannot give her IV iron with a ferritin level 100 or greater because insurance dose not approve.  June appointment is appropriate.  From an anemia standpoint, I have nothing more to offer in the way of her fatigue.  She should follow-up with her primary care provider.  Norbert Malkin 09/15/2014 4:40 PM

## 2014-09-15 NOTE — Telephone Encounter (Signed)
Patient notified that she needs to see her PCP regarding her fatigue.  Verbalized understanding of instructions.

## 2014-09-15 NOTE — Telephone Encounter (Signed)
Call from patient stating "when I was seen last, I was told by Gershon Mussel that we would try to keep my ferritin level in the 300's as that was where I felt the best.  I had my blood work done early and it was 139.  My decreased energy and weakness is getting worse and he told me I could be seen sooner if I needed to come.  My next appointment is not until June and just feel like I need to be seen sooner."

## 2014-09-19 ENCOUNTER — Other Ambulatory Visit (HOSPITAL_COMMUNITY): Payer: BLUE CROSS/BLUE SHIELD

## 2014-10-20 ENCOUNTER — Ambulatory Visit (INDEPENDENT_AMBULATORY_CARE_PROVIDER_SITE_OTHER): Payer: BLUE CROSS/BLUE SHIELD | Admitting: Urology

## 2014-10-20 DIAGNOSIS — N200 Calculus of kidney: Secondary | ICD-10-CM

## 2014-10-20 DIAGNOSIS — N3020 Other chronic cystitis without hematuria: Secondary | ICD-10-CM | POA: Diagnosis not present

## 2014-10-30 ENCOUNTER — Other Ambulatory Visit: Payer: Self-pay

## 2014-10-31 ENCOUNTER — Encounter (HOSPITAL_BASED_OUTPATIENT_CLINIC_OR_DEPARTMENT_OTHER): Payer: BLUE CROSS/BLUE SHIELD

## 2014-10-31 ENCOUNTER — Encounter (HOSPITAL_COMMUNITY): Payer: BLUE CROSS/BLUE SHIELD | Attending: Oncology | Admitting: Oncology

## 2014-10-31 ENCOUNTER — Other Ambulatory Visit (HOSPITAL_COMMUNITY): Payer: BLUE CROSS/BLUE SHIELD

## 2014-10-31 ENCOUNTER — Ambulatory Visit (HOSPITAL_COMMUNITY): Payer: BLUE CROSS/BLUE SHIELD | Admitting: Oncology

## 2014-10-31 VITALS — BP 143/75 | HR 105 | Temp 98.7°F | Resp 22 | Wt 277.1 lb

## 2014-10-31 DIAGNOSIS — Z9884 Bariatric surgery status: Secondary | ICD-10-CM

## 2014-10-31 DIAGNOSIS — D509 Iron deficiency anemia, unspecified: Secondary | ICD-10-CM

## 2014-10-31 LAB — CBC WITH DIFFERENTIAL/PLATELET
BASOS ABS: 0 10*3/uL (ref 0.0–0.1)
Basophils Relative: 0 % (ref 0–1)
EOS PCT: 2 % (ref 0–5)
Eosinophils Absolute: 0.1 10*3/uL (ref 0.0–0.7)
HCT: 46.2 % — ABNORMAL HIGH (ref 36.0–46.0)
Hemoglobin: 14.2 g/dL (ref 12.0–15.0)
LYMPHS ABS: 1.8 10*3/uL (ref 0.7–4.0)
Lymphocytes Relative: 30 % (ref 12–46)
MCH: 29.5 pg (ref 26.0–34.0)
MCHC: 30.7 g/dL (ref 30.0–36.0)
MCV: 96 fL (ref 78.0–100.0)
Monocytes Absolute: 0.2 10*3/uL (ref 0.1–1.0)
Monocytes Relative: 4 % (ref 3–12)
NEUTROS PCT: 64 % (ref 43–77)
Neutro Abs: 3.9 10*3/uL (ref 1.7–7.7)
PLATELETS: 311 10*3/uL (ref 150–400)
RBC: 4.81 MIL/uL (ref 3.87–5.11)
RDW: 12.6 % (ref 11.5–15.5)
WBC: 6 10*3/uL (ref 4.0–10.5)

## 2014-10-31 LAB — IRON AND TIBC
IRON: 95 ug/dL (ref 28–170)
Saturation Ratios: 30 % (ref 10.4–31.8)
TIBC: 315 ug/dL (ref 250–450)
UIBC: 220 ug/dL

## 2014-10-31 LAB — FERRITIN: FERRITIN: 132 ng/mL (ref 11–307)

## 2014-10-31 NOTE — Patient Instructions (Addendum)
[place] at [facility] Discharge Instructions  RECOMMENDATIONS MADE BY THE CONSULTANT AND ANY TEST RESULTS WILL BE SENT TO YOUR REFERRING PHYSICIAN.  Exam and discussion today with Kirby Crigler, PA. Return every 6 weeks for labs. Office visit in 18 weeks.  Thank you for choosing [place] at [facility] to provide your oncology and hematology care.  To afford each patient quality time with our provider, please arrive at least 15 minutes before your scheduled appointment time.    You need to re-schedule your appointment should you arrive 10 or more minutes late.  We strive to give you quality time with our providers, and arriving late affects you and other patients whose appointments are after yours.  Also, if you no show three or more times for appointments you may be dismissed from the clinic at the providers discretion.     Again, thank you for choosing [facility]  Our hope is that these requests will decrease the amount of time that you wait before being seen by our physicians.       _____________________________________________________________  Should you have questions after your visit to [facility], please contact our office at [phone] between the hours of 8:30 a.m. and 4:30 p.m.  Voicemails left after 4:30 p.m. will not be returned until the following business day.  For prescription refill requests, have your pharmacy contact our office.

## 2014-10-31 NOTE — Progress Notes (Signed)
Molly Morel, MD [place] [address]  IDA (iron deficiency anemia) - Plan: atorvastatin (LIPITOR) 20 MG tablet, EMBEDA 30-1.2 MG CPCR, CARAFATE 1 GM/10ML suspension, CBC with Differential, Iron and TIBC, Ferritin  CURRENT THERAPY: S/P IV Feraheme 510 mg on 1/12 and 05/25/2014.  INTERVAL HISTORY: Molly Berry 49 y.o. female returns for followup of iron deficiency anemia secondary to malabsorption after undergoing gastric bypass surgery at [facility] in 2011 with a resultant weight loss of 160 lbs, but gaining back weight.    I personally reviewed and went over laboratory results with the patient.  The results are noted within this dictation.  Her hemoglobin is stable, but her ferritin is showing sings of decline.  It is not yet below 100 at this time, but updated labs are pending.  She reports increased fatigue.  She notes that when her ferritin was in the 200 range, her energy is was much improved.  Given her ferritin trend, I suspect her ferritin is less than 100 at which time we can schedule her for IV iron.  Her ferritin is pending at this moment.  Otherwise, she denies ay complaints.   Past Medical History  Diagnosis Date  . Depression   . Fibromyalgia   . Diabetes mellitus   . PONV (postoperative nausea and vomiting)   . Asthma   . Endometrial cancer   . Uterine cancer 12/26/2011    Stage 1, grade 1, S/P hysterectomy initially by robotic technique and then salpingo-oophorectomy at [facility] receiving no postoperative treatment.  . Thyroid disease   . Hypothyroidism   . Multiple thyroid nodules   . GERD (gastroesophageal reflux disease)   . Ulcer   . Pulmonary nodules   . COPD (chronic obstructive pulmonary disease)   . 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     is allergic to bee venom; cephalexin; vancomycin; penicillins; adhesive; and latex.  Molly Berry had no medications administered during this visit.  Past Surgical History  Procedure Laterality Date  . Gastric bypass  2013    [facility]  . Cholecystectomy    . Dilation and curettage of uterus  12 yrs ago  . Hysteroscopy w/d&c  06/18/2011    Procedure: DILATATION AND CURETTAGE /HYSTEROSCOPY;  Surgeon: Florian Buff, MD;  Location: [facility];  Service: Gynecology;  Laterality: N/A;  . Dilation and curettage of uterus  06/18/2011    Procedure: DILATATION AND CURETTAGE;  Surgeon: Florian Buff, MD;  Location: [facility];  Service: Gynecology;  Laterality: N/A;  Suction Dilation and Curettage  . Abdominal hysterectomy    . Esophagogastroduodenoscopy N/A 10/12/2013    Dr. Rourk:anastomotic ulcer likely cause of bleeding. likely ischemic   . Lithotripsy    . Esophagogastroduodenoscopy (egd) with propofol N/A 01/19/2014    Procedure: ESOPHAGOGASTRODUODENOSCOPY (EGD) WITH PROPOFOL;  Surgeon: Daneil Dolin, MD;  Location: [facility];  Service: Endoscopy;  Laterality: N/A;  . Colonoscopy with propofol N/A 01/19/2014    Procedure: COLONOSCOPY WITH PROPOFOL;  Surgeon: Daneil Dolin, MD;  Location: [facility];  Service: Endoscopy;  Laterality: N/A;  in cecum at 1004; withdrawal time 9 minutes    Denies any headaches, dizziness, double vision, fevers, chills, night sweats, nausea, vomiting,  diarrhea, constipation, chest pain, heart palpitations, shortness of breath, blood in stool, black tarry stool, urinary pain, urinary burning, urinary frequency, hematuria.   PHYSICAL EXAMINATION  ECOG PERFORMANCE STATUS: 1  Filed Vitals:   10/31/14 1028  BP: 143/75  Pulse: 105  Temp: 98.7 F (37.1 C)  Resp: 22    GENERAL:alert, no distress, well nourished, well developed, comfortable, cooperative, obese and smiling, smelling of  feces/flatulence. SKIN: skin color, texture, turgor are normal, no rashes or significant lesions HEAD: Normocephalic, No masses, lesions, tenderness or abnormalities, facial hair EYES: normal, PERRLA, EOMI, Conjunctiva are pink and non-injected EARS: External ears normal OROPHARYNX:lips, buccal mucosa, and tongue normal and mucous membranes are moist  NECK: supple, no adenopathy, trachea midline LYMPH:  not examined BREAST:not examined LUNGS: clear to auscultation  HEART: regular rate & rhythm, no murmurs and no gallops ABDOMEN:abdomen soft and normal bowel sounds, obese BACK: Back symmetric, no curvature. EXTREMITIES:less then 2 second capillary refill, no joint deformities, effusion, or inflammation, no skin discoloration, no clubbing, no cyanosis  NEURO: alert & oriented x 3 with fluent speech, no focal motor/sensory deficits, gait normal   LABORATORY DATA: CBC    Component Value Date/Time   WBC 6.0 10/31/2014 1037   RBC 4.81 10/31/2014 1037   HGB 14.2 10/31/2014 1037   HCT 46.2* 10/31/2014 1037   PLT 311 10/31/2014 1037   MCV 96.0 10/31/2014 1037   MCH 29.5 10/31/2014 1037   MCHC 30.7 10/31/2014 1037   RDW 12.6 10/31/2014 1037   LYMPHSABS 1.8 10/31/2014 1037   MONOABS 0.2 10/31/2014 1037   EOSABS 0.1 10/31/2014 1037   BASOSABS 0.0 10/31/2014 1037      Chemistry      Component Value Date/Time   NA 141 05/08/2014 1233   K 3.0* 05/08/2014 1233   CL 104 05/08/2014 1233   CO2 29 05/08/2014 1233   BUN 10 05/08/2014 1233   CREATININE 0.81 05/08/2014 1233      Component Value Date/Time   CALCIUM 8.4 05/08/2014 1233   ALKPHOS 101 05/08/2014 1233   AST 18 05/08/2014 1233   ALT 12 05/08/2014 1233   BILITOT 0.3 05/08/2014 1233     Lab Results  Component Value Date   IRON 63 09/08/2014   TIBC 332 09/08/2014   FERRITIN 139 09/08/2014     ASSESSMENT AND PLAN:  IDA (iron deficiency anemia) Secondary to malabsorption after undergoing gastric bypass surgery at [facility]  in 2011.    S/P IV Feraheme 510 mg on 05/16/2014 and 05/25/2014.    Ferritin over the past few months has demonstrated a declined, but has not yet dropped below 100.   Labs today: CBC diff, iron/TIBC, ferritin.  Labs in 6 weeks, 12 weeks, and 18 weeks: CBC diff, iron/TIBC, ferritin.  Return in 18 weeks for follow-up.     THERAPY PLAN:  Will monitor labs and iron studies and provide IV iron support as needed to maintain a ferritin of 100 or greater.  All questions were answered. The patient knows to call the clinic with any problems, questions or concerns. We can certainly see the patient much sooner if necessary.  Patient and plan discussed with Dr. Ancil Linsey and she is in agreement with the aforementioned.   This note is electronically signed by: Robynn Pane 10/31/2014 11:39 AM

## 2014-10-31 NOTE — Assessment & Plan Note (Addendum)
Secondary to malabsorption after undergoing gastric bypass surgery at [facility] in 2011.    S/P IV Feraheme 510 mg on 05/16/2014 and 05/25/2014.    Ferritin over the past few months has demonstrated a declined, but has not yet dropped below 100.   Labs today: CBC diff, iron/TIBC, ferritin.  Labs in 6 weeks, 12 weeks, and 18 weeks: CBC diff, iron/TIBC, ferritin.  Return in 18 weeks for follow-up.

## 2014-10-31 NOTE — Progress Notes (Signed)
Labs drawn

## 2014-11-01 ENCOUNTER — Other Ambulatory Visit: Payer: Self-pay | Admitting: Internal Medicine

## 2014-11-01 ENCOUNTER — Other Ambulatory Visit (HOSPITAL_COMMUNITY): Payer: Self-pay | Admitting: Oncology

## 2014-11-01 ENCOUNTER — Encounter (HOSPITAL_COMMUNITY): Payer: Self-pay

## 2014-11-01 DIAGNOSIS — E559 Vitamin D deficiency, unspecified: Secondary | ICD-10-CM

## 2014-11-01 LAB — VITAMIN D 25 HYDROXY (VIT D DEFICIENCY, FRACTURES): Vit D, 25-Hydroxy: 23.2 ng/mL — ABNORMAL LOW (ref 30.0–100.0)

## 2014-11-01 MED ORDER — ERGOCALCIFEROL 1.25 MG (50000 UT) PO CAPS: 50000.0000 [IU] | ORAL_CAPSULE | ORAL | Status: DC

## 2014-11-01 NOTE — Telephone Encounter (Signed)
Per RMR's note at time of EGD, decrease protonix to once daily. New RX done accordingly.

## 2014-11-02 ENCOUNTER — Other Ambulatory Visit: Payer: Self-pay

## 2014-11-02 MED ORDER — PANTOPRAZOLE SODIUM 40 MG PO TBEC: 40.0000 mg | DELAYED_RELEASE_TABLET | Freq: Every day | ORAL | Status: DC

## 2014-12-12 ENCOUNTER — Encounter (HOSPITAL_COMMUNITY): Payer: BLUE CROSS/BLUE SHIELD | Attending: Oncology

## 2014-12-12 DIAGNOSIS — D509 Iron deficiency anemia, unspecified: Secondary | ICD-10-CM | POA: Insufficient documentation

## 2014-12-22 ENCOUNTER — Encounter (HOSPITAL_COMMUNITY): Payer: Self-pay

## 2014-12-28 ENCOUNTER — Encounter (HOSPITAL_COMMUNITY): Payer: BLUE CROSS/BLUE SHIELD | Attending: Oncology

## 2014-12-28 DIAGNOSIS — D509 Iron deficiency anemia, unspecified: Secondary | ICD-10-CM | POA: Diagnosis not present

## 2014-12-28 LAB — CBC WITH DIFFERENTIAL/PLATELET
Basophils Absolute: 0.1 10*3/uL (ref 0.0–0.1)
Basophils Relative: 1 % (ref 0–1)
Eosinophils Absolute: 0.2 10*3/uL (ref 0.0–0.7)
Eosinophils Relative: 3 % (ref 0–5)
HCT: 45.2 % (ref 36.0–46.0)
HEMOGLOBIN: 14.5 g/dL (ref 12.0–15.0)
LYMPHS ABS: 1.7 10*3/uL (ref 0.7–4.0)
LYMPHS PCT: 26 % (ref 12–46)
MCH: 28.9 pg (ref 26.0–34.0)
MCHC: 32.1 g/dL (ref 30.0–36.0)
MCV: 90.2 fL (ref 78.0–100.0)
Monocytes Absolute: 0.4 10*3/uL (ref 0.1–1.0)
Monocytes Relative: 6 % (ref 3–12)
Neutro Abs: 4.2 10*3/uL (ref 1.7–7.7)
Neutrophils Relative %: 64 % (ref 43–77)
Platelets: 366 10*3/uL (ref 150–400)
RBC: 5.01 MIL/uL (ref 3.87–5.11)
RDW: 12.4 % (ref 11.5–15.5)
WBC: 6.5 10*3/uL (ref 4.0–10.5)

## 2014-12-28 LAB — IRON AND TIBC
IRON: 46 ug/dL (ref 28–170)
Saturation Ratios: 16 % (ref 10.4–31.8)
TIBC: 288 ug/dL (ref 250–450)
UIBC: 242 ug/dL

## 2014-12-28 LAB — FERRITIN: FERRITIN: 161 ng/mL (ref 11–307)

## 2014-12-28 NOTE — Progress Notes (Signed)
LABS DRAWN

## 2015-01-01 ENCOUNTER — Other Ambulatory Visit: Payer: Self-pay | Admitting: Nurse Practitioner

## 2015-01-12 ENCOUNTER — Ambulatory Visit (INDEPENDENT_AMBULATORY_CARE_PROVIDER_SITE_OTHER): Payer: BLUE CROSS/BLUE SHIELD | Admitting: Urology

## 2015-01-12 ENCOUNTER — Ambulatory Visit (HOSPITAL_COMMUNITY)
Admission: RE | Admit: 2015-01-12 | Discharge: 2015-01-12 | Disposition: A | Discharge status: Home / Self Care | Payer: BLUE CROSS/BLUE SHIELD | Source: Ambulatory Visit | Attending: Urology | Admitting: Urology

## 2015-01-12 ENCOUNTER — Ambulatory Visit (HOSPITAL_COMMUNITY)
Admission: RE | Admit: 2015-01-12 | Discharge: 2015-01-12 | Disposition: A | Discharge status: Home / Self Care | Payer: BLUE CROSS/BLUE SHIELD | Source: Ambulatory Visit | Attending: Physical Medicine and Rehabilitation | Admitting: Physical Medicine and Rehabilitation

## 2015-01-12 ENCOUNTER — Other Ambulatory Visit: Payer: Self-pay | Admitting: Urology

## 2015-01-12 ENCOUNTER — Other Ambulatory Visit (HOSPITAL_COMMUNITY): Payer: Self-pay | Admitting: Physical Medicine and Rehabilitation

## 2015-01-12 DIAGNOSIS — M2578 Osteophyte, vertebrae: Secondary | ICD-10-CM | POA: Insufficient documentation

## 2015-01-12 DIAGNOSIS — R934 Abnormal findings on diagnostic imaging of urinary organs: Secondary | ICD-10-CM | POA: Insufficient documentation

## 2015-01-12 DIAGNOSIS — M542 Cervicalgia: Secondary | ICD-10-CM

## 2015-01-12 DIAGNOSIS — N3000 Acute cystitis without hematuria: Secondary | ICD-10-CM

## 2015-01-12 DIAGNOSIS — M545 Low back pain, unspecified: Secondary | ICD-10-CM

## 2015-01-12 DIAGNOSIS — N200 Calculus of kidney: Secondary | ICD-10-CM

## 2015-01-15 ENCOUNTER — Emergency Department (HOSPITAL_COMMUNITY)
Admission: EM | Admit: 2015-01-15 | Discharge: 2015-01-16 | Disposition: A | Discharge status: Home / Self Care | Payer: BLUE CROSS/BLUE SHIELD | Attending: Emergency Medicine | Admitting: Emergency Medicine

## 2015-01-15 ENCOUNTER — Encounter (HOSPITAL_COMMUNITY): Payer: Self-pay | Admitting: Emergency Medicine

## 2015-01-15 ENCOUNTER — Emergency Department (HOSPITAL_COMMUNITY)
Admission: EM | Admit: 2015-01-15 | Discharge: 2015-01-15 | Discharge status: Absent without leave | Payer: BLUE CROSS/BLUE SHIELD

## 2015-01-15 DIAGNOSIS — T370X5A Adverse effect of sulfonamides, initial encounter: Secondary | ICD-10-CM | POA: Insufficient documentation

## 2015-01-15 DIAGNOSIS — N390 Urinary tract infection, site not specified: Secondary | ICD-10-CM

## 2015-01-15 DIAGNOSIS — M797 Fibromyalgia: Secondary | ICD-10-CM | POA: Diagnosis not present

## 2015-01-15 DIAGNOSIS — Z9884 Bariatric surgery status: Secondary | ICD-10-CM | POA: Diagnosis not present

## 2015-01-15 DIAGNOSIS — Z91040 Latex allergy status: Secondary | ICD-10-CM | POA: Insufficient documentation

## 2015-01-15 DIAGNOSIS — Z79899 Other long term (current) drug therapy: Secondary | ICD-10-CM | POA: Diagnosis not present

## 2015-01-15 DIAGNOSIS — E876 Hypokalemia: Secondary | ICD-10-CM | POA: Insufficient documentation

## 2015-01-15 DIAGNOSIS — E119 Type 2 diabetes mellitus without complications: Secondary | ICD-10-CM | POA: Insufficient documentation

## 2015-01-15 DIAGNOSIS — J449 Chronic obstructive pulmonary disease, unspecified: Secondary | ICD-10-CM | POA: Diagnosis not present

## 2015-01-15 DIAGNOSIS — Z8542 Personal history of malignant neoplasm of other parts of uterus: Secondary | ICD-10-CM | POA: Diagnosis not present

## 2015-01-15 DIAGNOSIS — R509 Fever, unspecified: Secondary | ICD-10-CM | POA: Diagnosis present

## 2015-01-15 DIAGNOSIS — T7840XA Allergy, unspecified, initial encounter: Secondary | ICD-10-CM

## 2015-01-15 DIAGNOSIS — K219 Gastro-esophageal reflux disease without esophagitis: Secondary | ICD-10-CM | POA: Diagnosis not present

## 2015-01-15 DIAGNOSIS — Z792 Long term (current) use of antibiotics: Secondary | ICD-10-CM | POA: Diagnosis not present

## 2015-01-15 DIAGNOSIS — R21 Rash and other nonspecific skin eruption: Secondary | ICD-10-CM | POA: Diagnosis not present

## 2015-01-15 DIAGNOSIS — Z791 Long term (current) use of non-steroidal anti-inflammatories (NSAID): Secondary | ICD-10-CM | POA: Diagnosis not present

## 2015-01-15 DIAGNOSIS — Z880 Allergy status to penicillin: Secondary | ICD-10-CM | POA: Insufficient documentation

## 2015-01-15 LAB — CBC WITH DIFFERENTIAL/PLATELET
BASOS ABS: 0 10*3/uL (ref 0.0–0.1)
Basophils Relative: 0 % (ref 0–1)
EOS ABS: 0.2 10*3/uL (ref 0.0–0.7)
EOS PCT: 3 % (ref 0–5)
HCT: 42 % (ref 36.0–46.0)
Hemoglobin: 13.5 g/dL (ref 12.0–15.0)
LYMPHS ABS: 1.5 10*3/uL (ref 0.7–4.0)
LYMPHS PCT: 20 % (ref 12–46)
MCH: 28.9 pg (ref 26.0–34.0)
MCHC: 32.1 g/dL (ref 30.0–36.0)
MCV: 89.9 fL (ref 78.0–100.0)
MONO ABS: 0.8 10*3/uL (ref 0.1–1.0)
Monocytes Relative: 10 % (ref 3–12)
Neutro Abs: 5.2 10*3/uL (ref 1.7–7.7)
Neutrophils Relative %: 67 % (ref 43–77)
PLATELETS: 301 10*3/uL (ref 150–400)
RBC: 4.67 MIL/uL (ref 3.87–5.11)
RDW: 12.7 % (ref 11.5–15.5)
WBC: 7.7 10*3/uL (ref 4.0–10.5)

## 2015-01-15 LAB — COMPREHENSIVE METABOLIC PANEL
ALT: 13 U/L — ABNORMAL LOW (ref 14–54)
AST: 17 U/L (ref 15–41)
Albumin: 3.8 g/dL (ref 3.5–5.0)
Alkaline Phosphatase: 102 U/L (ref 38–126)
Anion gap: 9 (ref 5–15)
BUN: 21 mg/dL — ABNORMAL HIGH (ref 6–20)
CHLORIDE: 101 mmol/L (ref 101–111)
CO2: 23 mmol/L (ref 22–32)
Calcium: 8.4 mg/dL — ABNORMAL LOW (ref 8.9–10.3)
Creatinine, Ser: 1.01 mg/dL — ABNORMAL HIGH (ref 0.44–1.00)
Glucose, Bld: 172 mg/dL — ABNORMAL HIGH (ref 65–99)
POTASSIUM: 3.2 mmol/L — AB (ref 3.5–5.1)
SODIUM: 133 mmol/L — AB (ref 135–145)
Total Bilirubin: 0.6 mg/dL (ref 0.3–1.2)
Total Protein: 7.4 g/dL (ref 6.5–8.1)

## 2015-01-15 MED ORDER — SODIUM CHLORIDE 0.9 % IV SOLN
1000.0000 mL | Freq: Once | INTRAVENOUS | Status: AC
  Administered 2015-01-16: 1000 mL via INTRAVENOUS

## 2015-01-15 MED ORDER — GENTAMICIN SULFATE 40 MG/ML IJ SOLN
160.0000 mg | Freq: Once | INTRAMUSCULAR | Status: AC
  Administered 2015-01-16: 160 mg via INTRAMUSCULAR
  Filled 2015-01-15: qty 4

## 2015-01-15 MED ORDER — SODIUM CHLORIDE 0.9 % IV SOLN
1000.0000 mL | INTRAVENOUS | Status: DC
  Administered 2015-01-16: 1000 mL via INTRAVENOUS

## 2015-01-15 MED ORDER — FAMOTIDINE IN NACL 20-0.9 MG/50ML-% IV SOLN
20.0000 mg | Freq: Once | INTRAVENOUS | Status: AC
  Administered 2015-01-16: 20 mg via INTRAVENOUS
  Filled 2015-01-15: qty 50

## 2015-01-15 MED ORDER — METHYLPREDNISOLONE SODIUM SUCC 125 MG IJ SOLR
125.0000 mg | Freq: Once | INTRAMUSCULAR | Status: AC
  Administered 2015-01-16: 125 mg via INTRAVENOUS
  Filled 2015-01-15: qty 2

## 2015-01-15 MED ORDER — POTASSIUM CHLORIDE CRYS ER 20 MEQ PO TBCR
40.0000 meq | EXTENDED_RELEASE_TABLET | Freq: Once | ORAL | Status: AC
  Administered 2015-01-16: 40 meq via ORAL
  Filled 2015-01-15: qty 2

## 2015-01-15 MED ORDER — MORPHINE SULFATE (PF) 4 MG/ML IV SOLN
4.0000 mg | Freq: Once | INTRAVENOUS | Status: AC
  Administered 2015-01-16: 4 mg via INTRAVENOUS
  Filled 2015-01-15: qty 1

## 2015-01-15 MED ORDER — DIPHENHYDRAMINE HCL 50 MG/ML IJ SOLN
25.0000 mg | Freq: Once | INTRAMUSCULAR | Status: AC
  Administered 2015-01-16: 25 mg via INTRAVENOUS
  Filled 2015-01-15: qty 1

## 2015-01-15 NOTE — ED Notes (Signed)
Pt was being treated for UTI with bactrim DS - for UTI - on sat night/sunday am started to develop a rash all over body - Stopped taking the med-- last night during night developed fever - today feels generally unwell

## 2015-01-15 NOTE — ED Provider Notes (Signed)
CSN: [id number]     Arrival date & time 01/15/15  1742 History   First MD Initiated Contact with Patient 01/15/15 2331     Chief Complaint  Patient presents with  . Fever  . Allergic Reaction     (Consider location/radiation/quality/duration/timing/severity/associated sxs/prior Treatment) Patient is a 49 y.o. female presenting with fever and allergic reaction. The history is provided by the patient.  Fever Allergic Reaction She was seen by her physician for routine physical 3 days ago and found to have a urinary tract infection and started on trimethoprim-sulfamethoxazole. The next day, she started developing a rash on her trunk. Rash consists is red areas which are very itchy. She stopped taking the antibiotic and has been taking Benadryl which is not helping. She denies any pain. She has had subjective fever and did have some chills but no sweats. Should she denies abdominal pain or nausea or vomiting. Her physician contacted her today and that culture showed initiated to be on a different anabiotic and they prescribed nitrofurantoin. She was advised to calm to the office for an injection of gentamicin as well as the nitrofurantoin prescription. When she went to the office, the no suppression told her to go to the ED after denies any difficulty breathing or swallowing.  Past Medical History  Diagnosis Date  . Depression   . Fibromyalgia   . Diabetes mellitus   . PONV (postoperative nausea and vomiting)   . Asthma   . Endometrial cancer   . Uterine cancer 12/26/2011    Stage 1, grade 1, S/P hysterectomy initially by robotic technique and then salpingo-oophorectomy at [facility] receiving no postoperative treatment.  . Thyroid disease   . Hypothyroidism   . Multiple thyroid nodules   . GERD (gastroesophageal reflux disease)   . Ulcer   . Pulmonary nodules   . COPD (chronic obstructive pulmonary disease)   . 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 Gastric bypass  2013    [facility]  . Cholecystectomy    . Dilation and curettage of uterus  12 yrs ago  . Hysteroscopy w/d&c  06/18/2011    Procedure: DILATATION AND CURETTAGE /HYSTEROSCOPY;  Surgeon: Florian Buff, MD;  Location: [facility];  Service: Gynecology;  Laterality: N/A;  . Dilation and curettage of uterus  06/18/2011    Procedure: DILATATION AND CURETTAGE;  Surgeon: Florian Buff, MD;  Location: [facility];  Service: Gynecology;  Laterality: N/A;  Suction Dilation and Curettage  . Abdominal hysterectomy    . Esophagogastroduodenoscopy N/A 10/12/2013    Dr. Rourk:anastomotic ulcer likely cause of bleeding. likely ischemic   . Lithotripsy    . Esophagogastroduodenoscopy (egd) with propofol N/A 01/19/2014    Procedure: ESOPHAGOGASTRODUODENOSCOPY (EGD) WITH PROPOFOL;  Surgeon: Daneil Dolin, MD;  Location: [facility];  Service: Endoscopy;  Laterality: N/A;  . Colonoscopy with propofol N/A 01/19/2014    Procedure: COLONOSCOPY WITH PROPOFOL;  Surgeon: Daneil Dolin, MD;  Location: [facility];  Service: Endoscopy;  Laterality: N/A;  in cecum at 1004; withdrawal time 9 minutes   Family History  Problem Relation Age of Onset  . Anesthesia problems Neg Hx   . Hypotension Neg Hx   . Malignant hyperthermia Neg Hx   . Pseudochol deficiency Neg Hx   . Colon cancer Neg Hx   . Pneumonia Mother     Deceased  . Liver cancer Father     Living  .  Mental illness Sister    Social History  Substance Use Topics  . Smoking status: Never Smoker   . Smokeless tobacco: Never Used  . Alcohol Use: No   OB History    No data available     Review of Systems  Constitutional: Positive for fever.  All other systems reviewed and are negative.     Allergies  Bee venom; Cephalexin; Vancomycin; Penicillins; Adhesive; Latex; and Sulfa antibiotics  Home Medications   Prior to Admission medications   Medication Sig Start Date End Date Taking? Authorizing Provider  albuterol (PROVENTIL  HFA;VENTOLIN HFA) 108 (90 BASE) MCG/ACT inhaler Inhale 2 puffs into the lungs every 6 (six) hours as needed. For asthma    Yes Historical Provider, MD  ALPRAZolam Duanne Moron) 1 MG tablet Take 1 mg by mouth 3 (three) times daily as needed. FOR ANXIETY AND SLEEP   Yes Historical Provider, MD  atorvastatin (LIPITOR) 40 MG tablet Take 40 mg by mouth every evening. 01/11/15  Yes Historical Provider, MD  canagliflozin (INVOKANA) 100 MG TABS tablet Take 100 mg by mouth daily.   Yes Historical Provider, MD  CARAFATE 1 GM/10ML suspension TAKE 2 TEASPOONFULS BEFORE MEALS AND AT BEDTIME. 01/03/15  Yes Mahala Menghini, PA-C  clotrimazole-betamethasone (LOTRISONE) cream Apply 1 application topically as needed (for skin irritation).  08/25/13  Yes Historical Provider, MD  cyanocobalamin (,VITAMIN B-12,) 1000 MCG/ML injection Inject 1,000 mcg into the muscle once.   Yes Historical Provider, MD  diphenhydrAMINE (BENADRYL) 25 mg capsule Take 25-75 mg by mouth See admin instructions. Taking 1 Capsule daily as needed for allergic reaction and takes 3 capsules at bedtime   Yes Historical Provider, MD  DULoxetine (CYMBALTA) 60 MG capsule Take 120 mg by mouth every morning.    Yes Historical Provider, MD  ergocalciferol (VITAMIN D2) 50000 UNITS capsule Take 1 capsule (50,000 Units total) by mouth once a week. Patient taking differently: Take 50,000 Units by mouth 2 (two) times a week.  11/01/14  Yes Manon Hilding Kefalas, PA-C  furosemide (LASIX) 20 MG tablet Take 20 mg by mouth as needed. 02/15/14  Yes Historical Provider, MD  gabapentin (NEURONTIN) 600 MG tablet Take 600 mg by mouth at bedtime.  01/14/14  Yes Historical Provider, MD  lamoTRIgine (LAMICTAL) 100 MG tablet Take 100 mg by mouth 2 (two) times daily.    Yes Historical Provider, MD  lisinopril (PRINIVIL,ZESTRIL) 40 MG tablet 40 mg daily as needed. Take when blood pressure "spikes" per pt 03/27/14  Yes Historical Provider, MD  Melatonin 10 MG TABS Take 1 tablet by mouth at  bedtime.    Yes Historical Provider, MD  meloxicam (MOBIC) 7.5 MG tablet Take 7.5 mg by mouth 2 (two) times daily. 01/11/15  Yes Historical Provider, MD  Methylphenidate HCl ER 54 MG TB24 Take 1 capsule by mouth daily.  06/30/14  Yes Historical Provider, MD  Multiple Vitamins-Minerals (MULTIVITAMIN WITH MINERALS) tablet Take 1 tablet by mouth every morning.    Yes Historical Provider, MD  Nystatin (Morley) 100000 UNIT/GM POWD Apply topically as needed (for skin iritation).  04/13/14  Yes Historical Provider, MD  ondansetron (ZOFRAN) 8 MG tablet Take 8 mg by mouth every 8 (eight) hours as needed for nausea or vomiting.  11/24/13  Yes Historical Provider, MD  pantoprazole (PROTONIX) 40 MG tablet Take 1 tablet (40 mg total) by mouth daily. 11/02/14  Yes Mahala Menghini, PA-C  SUMAtriptan (IMITREX) 100 MG tablet Take 100 mg by mouth as needed for migraine.  02/18/13  Yes Historical Provider, MD  tiZANidine (ZANAFLEX) 4 MG tablet Take 4 mg by mouth at bedtime. For spasms.   Yes Historical Provider, MD  nitrofurantoin, macrocrystal-monohydrate, (MACROBID) 100 MG capsule Take 100 mg by mouth 2 (two) times daily. 08/02/14   Historical Provider, MD   BP 147/72 mmHg  Pulse 90  Temp(Src) 98.4 F (36.9 C) (Oral)  Resp 18  Ht 5\' 3"  (1.6 m)  Wt 265 lb (120.203 kg)  BMI 46.95 kg/m2  SpO2 99%  LMP 05/06/2011 Physical Exam  Nursing note and vitals reviewed.  Obese 49 year old female, resting comfortably and in no acute distress. Vital signs are significant for hypertension. Oxygen saturation is 99%, which is normal. Head is normocephalic and atraumatic. PERRLA, EOMI. Oropharynx is clear. Neck is nontender and supple without adenopathy or JVD. Back is nontender and there is no CVA tenderness. Lungs are clear without rales, wheezes, or rhonchi. Chest is nontender. Heart has regular rate and rhythm without murmur. Abdomen is soft, flat, nontender without masses or hepatosplenomegaly and peristalsis is  normoactive. Extremities have no cyanosis or edema, full range of motion is present. Skin is warm and dry: Rash is present on the trunk consisting of well circumscribed erythematous areas which are very minimally raised. There are approximately 6 of these of varying size. No urticaria is seen and no maculopapular rashes seen. Neurologic: Mental status is normal, cranial nerves are intact, there are no motor or sensory deficits.  ED Course  Procedures (including critical care time) Labs Review Results for orders placed or performed during the hospital encounter of 01/15/15  CBC with Differential  Result Value Ref Range   WBC 7.7 4.0 - 10.5 K/uL   RBC 4.67 3.87 - 5.11 MIL/uL   Hemoglobin 13.5 12.0 - 15.0 g/dL   HCT 42.0 36.0 - 46.0 %   MCV 89.9 78.0 - 100.0 fL   MCH 28.9 26.0 - 34.0 pg   MCHC 32.1 30.0 - 36.0 g/dL   RDW 12.7 11.5 - 15.5 %   Platelets 301 150 - 400 K/uL   Neutrophils Relative % 67 43 - 77 %   Neutro Abs 5.2 1.7 - 7.7 K/uL   Lymphocytes Relative 20 12 - 46 %   Lymphs Abs 1.5 0.7 - 4.0 K/uL   Monocytes Relative 10 3 - 12 %   Monocytes Absolute 0.8 0.1 - 1.0 K/uL   Eosinophils Relative 3 0 - 5 %   Eosinophils Absolute 0.2 0.0 - 0.7 K/uL   Basophils Relative 0 0 - 1 %   Basophils Absolute 0.0 0.0 - 0.1 K/uL  Comprehensive metabolic panel  Result Value Ref Range   Sodium 133 (L) 135 - 145 mmol/L   Potassium 3.2 (L) 3.5 - 5.1 mmol/L   Chloride 101 101 - 111 mmol/L   CO2 23 22 - 32 mmol/L   Glucose, Bld 172 (H) 65 - 99 mg/dL   BUN 21 (H) 6 - 20 mg/dL   Creatinine, Ser 1.01 (H) 0.44 - 1.00 mg/dL   Calcium 8.4 (L) 8.9 - 10.3 mg/dL   Total Protein 7.4 6.5 - 8.1 g/dL   Albumin 3.8 3.5 - 5.0 g/dL   AST 17 15 - 41 U/L   ALT 13 (L) 14 - 54 U/L   Alkaline Phosphatase 102 38 - 126 U/L   Total Bilirubin 0.6 0.3 - 1.2 mg/dL   GFR calc non Af Amer >60 >60 mL/min   GFR calc Af Amer >60 >60 mL/min  Anion gap 9 5 - 15  Urinalysis, Routine w reflex microscopic (not at [facility])   Result Value Ref Range   Color, Urine YELLOW YELLOW   APPearance HAZY (A) CLEAR   Specific Gravity, Urine 1.020 1.005 - 1.030   pH 6.0 5.0 - 8.0   Glucose, UA >1000 (A) NEGATIVE mg/dL   Hgb urine dipstick SMALL (A) NEGATIVE   Bilirubin Urine NEGATIVE NEGATIVE   Ketones, ur TRACE (A) NEGATIVE mg/dL   Protein, ur NEGATIVE NEGATIVE mg/dL   Urobilinogen, UA 1.0 0.0 - 1.0 mg/dL   Nitrite POSITIVE (A) NEGATIVE   Leukocytes, UA TRACE (A) NEGATIVE  Lactic acid, plasma  Result Value Ref Range   Lactic Acid, Venous 0.7 0.5 - 2.0 mmol/L  Urine microscopic-add on  Result Value Ref Range   Squamous Epithelial / LPF FEW (A) RARE   WBC, UA 11-20 <3 WBC/hpf   RBC / HPF 7-10 <3 RBC/hpf   Bacteria, UA MANY (A) RARE  I have personally reviewed and evaluated these lab results as part of my medical decision-making.   MDM   Final diagnoses:  Allergic reaction to drug  Urinary tract infection without hematuria, site unspecified    Rash which started after patient started taking sulfamethoxazole. It is not a typical drug rash but most likely is a drug rash. Lab work shows elevated BUN suggestive of dehydration. Mild hypokalemia is also present. She is allergic to several sport and is on. She will be given IV fluids and IV methylprednisolone as well as diphenhydramine and famotidine. She will be given a single dose of gentamicin and also oral potassium.  She only had partial relief of itching with above noted treatment and she is given a dose of lorazepam. Urinalysis does show ongoing infection. She is referred back to her urologist. Prescription is given for prednisone and lorazepam.  Delora Fuel, MD 95/28/41 [id number]

## 2015-01-16 LAB — URINALYSIS, ROUTINE W REFLEX MICROSCOPIC
Bilirubin Urine: NEGATIVE
Nitrite: POSITIVE — AB
Protein, ur: NEGATIVE mg/dL
SPECIFIC GRAVITY, URINE: 1.02 (ref 1.005–1.030)
Urobilinogen, UA: 1 mg/dL (ref 0.0–1.0)
pH: 6 (ref 5.0–8.0)

## 2015-01-16 LAB — URINE MICROSCOPIC-ADD ON

## 2015-01-16 LAB — LACTIC ACID, PLASMA: LACTIC ACID, VENOUS: 0.7 mmol/L (ref 0.5–2.0)

## 2015-01-16 MED ORDER — LORAZEPAM 1 MG PO TABS: 1.0000 mg | ORAL_TABLET | Freq: Three times a day (TID) | ORAL | Status: DC | PRN

## 2015-01-16 MED ORDER — PREDNISONE 50 MG PO TABS: 50.0000 mg | ORAL_TABLET | Freq: Every day | ORAL | Status: DC

## 2015-01-16 MED ORDER — LORAZEPAM 1 MG PO TABS
1.0000 mg | ORAL_TABLET | Freq: Once | ORAL | Status: AC
  Administered 2015-01-16: 1 mg via ORAL
  Filled 2015-01-16: qty 1

## 2015-01-16 NOTE — Discharge Instructions (Signed)
Continue taking diphenhydramine (Benadryl) as needed for itching. If itching is not being adequately controlled, then take lorazepam.  Allergies Allergies may happen from anything your body is sensitive to. This may be food, medicines, pollens, chemicals, and nearly anything around you in everyday life that produces allergens. An allergen is anything that causes an allergy producing substance. Heredity is often a factor in causing these problems. This means you may have some of the same allergies as your parents. Food allergies happen in all age groups. Food allergies are some of the most severe and life threatening. Some common food allergies are cow's milk, seafood, eggs, nuts, wheat, and soybeans. SYMPTOMS   Swelling around the mouth.  An itchy red rash or hives.  Vomiting or diarrhea.  Difficulty breathing. SEVERE ALLERGIC REACTIONS ARE LIFE-THREATENING. This reaction is called anaphylaxis. It can cause the mouth and throat to swell and cause difficulty with breathing and swallowing. In severe reactions only a trace amount of food (for example, peanut oil in a salad) may cause death within seconds. Seasonal allergies occur in all age groups. These are seasonal because they usually occur during the same season every year. They may be a reaction to molds, grass pollens, or tree pollens. Other causes of problems are house dust mite allergens, pet dander, and mold spores. The symptoms often consist of nasal congestion, a runny itchy nose associated with sneezing, and tearing itchy eyes. There is often an associated itching of the mouth and ears. The problems happen when you come in contact with pollens and other allergens. Allergens are the particles in the air that the body reacts to with an allergic reaction. This causes you to release allergic antibodies. Through a chain of events, these eventually cause you to release histamine into the blood stream. Although it is meant to be protective to the  body, it is this release that causes your discomfort. This is why you were given anti-histamines to feel better. If you are unable to pinpoint the offending allergen, it may be determined by skin or blood testing. Allergies cannot be cured but can be controlled with medicine. Hay fever is a collection of all or some of the seasonal allergy problems. It may often be treated with simple over-the-counter medicine such as diphenhydramine. Take medicine as directed. Do not drink alcohol or drive while taking this medicine. Check with your caregiver or package insert for child dosages. If these medicines are not effective, there are many new medicines your caregiver can prescribe. Stronger medicine such as nasal spray, eye drops, and corticosteroids may be used if the first things you try do not work well. Other treatments such as immunotherapy or desensitizing injections can be used if all else fails. Follow up with your caregiver if problems continue. These seasonal allergies are usually not life threatening. They are generally more of a nuisance that can often be handled using medicine. HOME CARE INSTRUCTIONS   If unsure what causes a reaction, keep a diary of foods eaten and symptoms that follow. Avoid foods that cause reactions.  If hives or rash are present:  Take medicine as directed.  You may use an over-the-counter antihistamine (diphenhydramine) for hives and itching as needed.  Apply cold compresses (cloths) to the skin or take baths in cool water. Avoid hot baths or showers. Heat will make a rash and itching worse.  If you are severely allergic:  Following a treatment for a severe reaction, hospitalization is often required for closer follow-up.  Wear a medic-alert  bracelet or necklace stating the allergy.  You and your family must learn how to give adrenaline or use an anaphylaxis kit.  If you have had a severe reaction, always carry your anaphylaxis kit or EpiPen with you. Use this  medicine as directed by your caregiver if a severe reaction is occurring. Failure to do so could have a fatal outcome. SEEK MEDICAL CARE IF:  You suspect a food allergy. Symptoms generally happen within 30 minutes of eating a food.  Your symptoms have not gone away within 2 days or are getting worse.  You develop new symptoms.  You want to retest yourself or your child with a food or drink you think causes an allergic reaction. Never do this if an anaphylactic reaction to that food or drink has happened before. Only do this under the care of a caregiver. SEEK IMMEDIATE MEDICAL CARE IF:   You have difficulty breathing, are wheezing, or have a tight feeling in your chest or throat.  You have a swollen mouth, or you have hives, swelling, or itching all over your body.  You have had a severe reaction that has responded to your anaphylaxis kit or an EpiPen. These reactions may return when the medicine has worn off. These reactions should be considered life threatening. MAKE SURE YOU:   Understand these instructions.  Will watch your condition.  Will get help right away if you are not doing well or get worse. Document Released: 07/15/2002 Document Revised: 08/16/2012 Document Reviewed: 12/20/2007 [facility] Patient Information 2015 [place], [place]. This information is not intended to replace advice given to you by your health care provider. Make sure you discuss any questions you have with your health care provider.  Prednisone tablets What is this medicine? PREDNISONE (PRED ni sone) is a corticosteroid. It is commonly used to treat inflammation of the skin, joints, lungs, and other organs. Common conditions treated include asthma, allergies, and arthritis. It is also used for other conditions, such as blood disorders and diseases of the adrenal glands. This medicine may be used for other purposes; ask your health care provider or pharmacist if you have questions. COMMON BRAND NAME(S):  Deltasone, Predone, Sterapred, Sterapred DS What should I tell my health care provider before I take this medicine? They need to know if you have any of these conditions: -Cushing's syndrome -diabetes -glaucoma -heart disease -high blood pressure -infection (especially a virus infection such as chickenpox, cold sores, or herpes) -kidney disease -liver disease -mental illness -myasthenia gravis -osteoporosis -seizures -stomach or intestine problems -thyroid disease -an unusual or allergic reaction to lactose, prednisone, other medicines, foods, dyes, or preservatives -pregnant or trying to get pregnant -breast-feeding How should I use this medicine? Take this medicine by mouth with a glass of water. Follow the directions on the prescription label. Take this medicine with food. If you are taking this medicine once a day, take it in the morning. Do not take more medicine than you are told to take. Do not suddenly stop taking your medicine because you may develop a severe reaction. Your doctor will tell you how much medicine to take. If your doctor wants you to stop the medicine, the dose may be slowly lowered over time to avoid any side effects. Talk to your pediatrician regarding the use of this medicine in children. Special care may be needed. Overdosage: If you think you have taken too much of this medicine contact a poison control center or emergency room at once. NOTE: This medicine is only for  you. Do not share this medicine with others. What if I miss a dose? If you miss a dose, take it as soon as you can. If it is almost time for your next dose, talk to your doctor or health care professional. You may need to miss a dose or take an extra dose. Do not take double or extra doses without advice. What may interact with this medicine? Do not take this medicine with any of the following medications: -metyrapone -mifepristone This medicine may also interact with the following  medications: -aminoglutethimide -amphotericin B -aspirin and aspirin-like medicines -barbiturates -certain medicines for diabetes, like glipizide or glyburide -cholestyramine -cholinesterase inhibitors -cyclosporine -digoxin -diuretics -ephedrine -female hormones, like estrogens and birth control pills -isoniazid -ketoconazole -NSAIDS, medicines for pain and inflammation, like ibuprofen or naproxen -phenytoin -rifampin -toxoids -vaccines -warfarin This list may not describe all possible interactions. Give your health care provider a list of all the medicines, herbs, non-prescription drugs, or dietary supplements you use. Also tell them if you smoke, drink alcohol, or use illegal drugs. Some items may interact with your medicine. What should I watch for while using this medicine? Visit your doctor or health care professional for regular checks on your progress. If you are taking this medicine over a prolonged period, carry an identification card with your name and address, the type and dose of your medicine, and your doctor's name and address. This medicine may increase your risk of getting an infection. Tell your doctor or health care professional if you are around anyone with measles or chickenpox, or if you develop sores or blisters that do not heal properly. If you are going to have surgery, tell your doctor or health care professional that you have taken this medicine within the last twelve months. Ask your doctor or health care professional about your diet. You may need to lower the amount of salt you eat. This medicine may affect blood sugar levels. If you have diabetes, check with your doctor or health care professional before you change your diet or the dose of your diabetic medicine. What side effects may I notice from receiving this medicine? Side effects that you should report to your doctor or health care professional as soon as possible: -allergic reactions like skin rash,  itching or hives, swelling of the face, lips, or tongue -changes in emotions or moods -changes in vision -depressed mood -eye pain -fever or chills, cough, sore throat, pain or difficulty passing urine -increased thirst -swelling of ankles, feet Side effects that usually do not require medical attention (report to your doctor or health care professional if they continue or are bothersome): -confusion, excitement, restlessness -headache -nausea, vomiting -skin problems, acne, thin and shiny skin -trouble sleeping -weight gain This list may not describe all possible side effects. Call your doctor for medical advice about side effects. You may report side effects to FDA at 1-800-FDA-1088. Where should I keep my medicine? Keep out of the reach of children. Store at room temperature between 15 and 30 degrees C (59 and 86 degrees F). Protect from light. Keep container tightly closed. Throw away any unused medicine after the expiration date. NOTE: This sheet is a summary. It may not cover all possible information. If you have questions about this medicine, talk to your doctor, pharmacist, or health care provider.  2015, Elsevier/Gold Standard. (2010-12-05 10:57:14)  Lorazepam tablets What is this medicine? LORAZEPAM (lor A ze pam) is a benzodiazepine. It is used to treat anxiety. This medicine may be used  for other purposes; ask your health care provider or pharmacist if you have questions. COMMON BRAND NAME(S): Ativan What should I tell my health care provider before I take this medicine? They need to know if you have any of these conditions: -alcohol or drug abuse problem -bipolar disorder, depression, psychosis or other mental health condition -glaucoma -kidney or liver disease -lung disease or breathing difficulties -myasthenia gravis -Parkinson's disease -seizures or a history of seizures -suicidal thoughts -an unusual or allergic reaction to lorazepam, other benzodiazepines,  foods, dyes, or preservatives -pregnant or trying to get pregnant -breast-feeding How should I use this medicine? Take this medicine by mouth with a glass of water. Follow the directions on the prescription label. If it upsets your stomach, take it with food or milk. Take your medicine at regular intervals. Do not take it more often than directed. Do not stop taking except on the advice of your doctor or health care professional. Talk to your pediatrician regarding the use of this medicine in children. Special care may be needed. Overdosage: If you think you have taken too much of this medicine contact a poison control center or emergency room at once. NOTE: This medicine is only for you. Do not share this medicine with others. What if I miss a dose? If you miss a dose, take it as soon as you can. If it is almost time for your next dose, take only that dose. Do not take double or extra doses. What may interact with this medicine? -barbiturate medicines for inducing sleep or treating seizures, like phenobarbital -clozapine -medicines for depression, mental problems or psychiatric disturbances -medicines for sleep -phenytoin -probenecid -theophylline -valproic acid This list may not describe all possible interactions. Give your health care provider a list of all the medicines, herbs, non-prescription drugs, or dietary supplements you use. Also tell them if you smoke, drink alcohol, or use illegal drugs. Some items may interact with your medicine. What should I watch for while using this medicine? Visit your doctor or health care professional for regular checks on your progress. Your body may become dependent on this medicine, ask your doctor or health care professional if you still need to take it. However, if you have been taking this medicine regularly for some time, do not suddenly stop taking it. You must gradually reduce the dose or you may get severe side effects. Ask your doctor or health  care professional for advice before increasing or decreasing the dose. Even after you stop taking this medicine it can still affect your body for several days. You may get drowsy or dizzy. Do not drive, use machinery, or do anything that needs mental alertness until you know how this medicine affects you. To reduce the risk of dizzy and fainting spells, do not stand or sit up quickly, especially if you are an older patient. Alcohol may increase dizziness and drowsiness. Avoid alcoholic drinks. Do not treat yourself for coughs, colds or allergies without asking your doctor or health care professional for advice. Some ingredients can increase possible side effects. What side effects may I notice from receiving this medicine? Side effects that you should report to your doctor or health care professional as soon as possible: -changes in vision -confusion -depression -mood changes, excitability or aggressive behavior -movement difficulty, staggering or jerky movements -muscle cramps -restlessness -weakness or tiredness Side effects that usually do not require medical attention (report to your doctor or health care professional if they continue or are bothersome): -constipation or diarrhea -difficulty  sleeping, nightmares -dizziness, drowsiness -headache -nausea, vomiting This list may not describe all possible side effects. Call your doctor for medical advice about side effects. You may report side effects to FDA at 1-800-FDA-1088. Where should I keep my medicine? Keep out of the reach of children. This medicine can be abused. Keep your medicine in a safe place to protect it from theft. Do not share this medicine with anyone. Selling or giving away this medicine is dangerous and against the law. Store at room temperature between 20 and 25 degrees C (68 and 77 degrees F). Protect from light. Keep container tightly closed. Throw away any unused medicine after the expiration date. NOTE: This sheet is  a summary. It may not cover all possible information. If you have questions about this medicine, talk to your doctor, pharmacist, or health care provider.  2015, Elsevier/Gold Standard. (2007-10-22 14:58:20)

## 2015-01-16 NOTE — ED Notes (Signed)
Discharge instructions given, pt demonstrated teach back and verbal understanding. No concerns voiced.  

## 2015-01-23 ENCOUNTER — Encounter (HOSPITAL_COMMUNITY): Payer: BLUE CROSS/BLUE SHIELD | Attending: Oncology

## 2015-01-23 DIAGNOSIS — D509 Iron deficiency anemia, unspecified: Secondary | ICD-10-CM | POA: Insufficient documentation

## 2015-01-29 ENCOUNTER — Encounter (HOSPITAL_COMMUNITY): Payer: Self-pay

## 2015-01-30 ENCOUNTER — Other Ambulatory Visit: Payer: Self-pay | Admitting: Gastroenterology

## 2015-01-31 ENCOUNTER — Other Ambulatory Visit: Payer: Self-pay

## 2015-01-31 MED ORDER — SUCRALFATE 1 GM/10ML PO SUSP: ORAL | Status: DC

## 2015-02-02 ENCOUNTER — Ambulatory Visit: Payer: BLUE CROSS/BLUE SHIELD | Admitting: Urology

## 2015-02-06 ENCOUNTER — Other Ambulatory Visit: Payer: Self-pay | Admitting: "Endocrinology

## 2015-02-12 ENCOUNTER — Other Ambulatory Visit: Payer: Self-pay | Admitting: Urology

## 2015-02-12 DIAGNOSIS — R39198 Other difficulties with micturition: Secondary | ICD-10-CM

## 2015-02-12 DIAGNOSIS — N3000 Acute cystitis without hematuria: Secondary | ICD-10-CM

## 2015-02-12 DIAGNOSIS — R8281 Pyuria: Secondary | ICD-10-CM

## 2015-02-15 ENCOUNTER — Ambulatory Visit (HOSPITAL_COMMUNITY)
Admission: RE | Admit: 2015-02-15 | Discharge: 2015-02-15 | Disposition: A | Discharge status: Home / Self Care | Payer: BLUE CROSS/BLUE SHIELD | Source: Ambulatory Visit | Attending: Urology | Admitting: Urology

## 2015-02-15 DIAGNOSIS — R8281 Pyuria: Secondary | ICD-10-CM

## 2015-02-15 DIAGNOSIS — Z87442 Personal history of urinary calculi: Secondary | ICD-10-CM | POA: Diagnosis not present

## 2015-02-15 DIAGNOSIS — N3000 Acute cystitis without hematuria: Secondary | ICD-10-CM | POA: Diagnosis not present

## 2015-02-15 DIAGNOSIS — R39198 Other difficulties with micturition: Secondary | ICD-10-CM

## 2015-02-16 ENCOUNTER — Ambulatory Visit (INDEPENDENT_AMBULATORY_CARE_PROVIDER_SITE_OTHER): Payer: BLUE CROSS/BLUE SHIELD | Admitting: Urology

## 2015-02-16 DIAGNOSIS — N200 Calculus of kidney: Secondary | ICD-10-CM

## 2015-02-16 DIAGNOSIS — N3000 Acute cystitis without hematuria: Secondary | ICD-10-CM | POA: Diagnosis not present

## 2015-02-19 ENCOUNTER — Other Ambulatory Visit: Payer: Self-pay | Admitting: "Endocrinology

## 2015-03-05 NOTE — Progress Notes (Signed)
Molly Morel, MD [place] [address]  IDA (iron deficiency anemia)  CURRENT THERAPY: S/P IV Feraheme 510 mg on 1/12 and 05/25/2014.  INTERVAL HISTORY: Molly Berry 48 y.o. 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    I personally reviewed and went over laboratory results with the patient.  The results are noted within this dictation.  Labs are being updated today.  She notes fatigue, which seems to be a chronic issue for her.  She denies any blood in her stool or dark stools.  She is seeing Dr. Jeffie Pollock and company for follow-up regarding recurrent UTIs.    Past Medical History  Diagnosis Date  . Depression   . Fibromyalgia   . Diabetes mellitus   . PONV (postoperative nausea and vomiting)   . Asthma   . Endometrial cancer ([place])   . Uterine cancer ([place]) 12/26/2011    Stage 1, grade 1, S/P hysterectomy initially by robotic technique and then salpingo-oophorectomy at [facility] receiving no postoperative treatment.  . Thyroid disease   . Hypothyroidism   . Multiple thyroid nodules   . GERD (gastroesophageal reflux disease)   . Ulcer   . Pulmonary nodules   . COPD (chronic obstructive pulmonary disease) ([place])   . 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 Chronic kidney disease     has SOB (shortness of breath); Multiple pulmonary nodules; Abdominal pain, epigastric; Melena; Ureteral calculus, left; Anastomotic ulcer; Heme positive stool; IDA (iron deficiency anemia); Multiple thyroid nodules; and H/O gastric bypass on her problem list.     is allergic to bee venom; cephalexin; vancomycin; penicillins; adhesive; latex; and sulfa antibiotics.  Ms. Fenn does not currently have medications on file.  Past Surgical History  Procedure Laterality Date  . Gastric bypass  2013   [facility]  . Cholecystectomy    . Dilation and curettage of uterus  12 yrs ago  . Hysteroscopy w/d&c  06/18/2011    Procedure: DILATATION AND CURETTAGE /HYSTEROSCOPY;  Surgeon: Florian Buff, MD;  Location: [facility];  Service: Gynecology;  Laterality: N/A;  . Dilation and curettage of uterus  06/18/2011    Procedure: DILATATION AND CURETTAGE;  Surgeon: Florian Buff, MD;  Location: [facility];  Service: Gynecology;  Laterality: N/A;  Suction Dilation and Curettage  . Abdominal hysterectomy    . Esophagogastroduodenoscopy N/A 10/12/2013    Dr. Rourk:anastomotic ulcer likely cause of bleeding. likely ischemic   . Lithotripsy    . Esophagogastroduodenoscopy (egd) with propofol N/A 01/19/2014    Procedure: ESOPHAGOGASTRODUODENOSCOPY (EGD) WITH PROPOFOL;  Surgeon: Daneil Dolin, MD;  Location: [facility];  Service: Endoscopy;  Laterality: N/A;  . Colonoscopy with propofol N/A 01/19/2014    Procedure: COLONOSCOPY WITH PROPOFOL;  Surgeon: Daneil Dolin, MD;  Location: [facility];  Service: Endoscopy;  Laterality: N/A;  in cecum at 1004; withdrawal time 9 minutes    Denies any headaches, dizziness, double vision, fevers, chills, night sweats, nausea, vomiting, diarrhea, constipation, chest pain, heart palpitations, shortness of breath, blood in stool, black tarry stool, urinary pain, urinary burning, urinary frequency, hematuria.   PHYSICAL EXAMINATION  ECOG PERFORMANCE STATUS: 1  Filed Vitals:   03/06/15 0900  BP: 94/56  Pulse: 61  Temp: 98.1 F (36.7 C)  Resp: 18    GENERAL:alert,  no distress, well nourished, well developed, comfortable, cooperative, obese and smiling, smelling of poor cleanliness. SKIN: skin color, texture, turgor are normal, no rashes or significant lesions HEAD: Normocephalic, No masses, lesions, tenderness or abnormalities, facial hair EYES: normal, PERRLA, EOMI, Conjunctiva are pink and non-injected EARS: External ears normal OROPHARYNX:lips, buccal mucosa, and tongue normal and  mucous membranes are moist  NECK: supple, no adenopathy, trachea midline LYMPH:  not examined BREAST:not examined LUNGS: clear to auscultation  HEART: regular rate & rhythm, no murmurs and no gallops ABDOMEN:abdomen soft and normal bowel sounds, obese BACK: Back symmetric, no curvature. EXTREMITIES:less then 2 second capillary refill, no joint deformities, effusion, or inflammation, no skin discoloration, no clubbing, no cyanosis  NEURO: alert & oriented x 3 with fluent speech, no focal motor/sensory deficits, gait normal   LABORATORY DATA: CBC    Component Value Date/Time   WBC 7.0 03/06/2015 0916   RBC 4.54 03/06/2015 0916   HGB 12.9 03/06/2015 0916   HCT 41.8 03/06/2015 0916   PLT 346 03/06/2015 0916   MCV 92.1 03/06/2015 0916   MCH 28.4 03/06/2015 0916   MCHC 30.9 03/06/2015 0916   RDW 13.3 03/06/2015 0916   LYMPHSABS 1.7 03/06/2015 0916   MONOABS 0.4 03/06/2015 0916   EOSABS 0.3 03/06/2015 0916   BASOSABS 0.0 03/06/2015 0916      Chemistry      Component Value Date/Time   NA 133* 01/15/2015 2145   K 3.2* 01/15/2015 2145   CL 101 01/15/2015 2145   CO2 23 01/15/2015 2145   BUN 21* 01/15/2015 2145   CREATININE 1.01* 01/15/2015 2145      Component Value Date/Time   CALCIUM 8.4* 01/15/2015 2145   ALKPHOS 102 01/15/2015 2145   AST 17 01/15/2015 2145   ALT 13* 01/15/2015 2145   BILITOT 0.6 01/15/2015 2145     Lab Results  Component Value Date   IRON 46 12/28/2014   TIBC 288 12/28/2014   FERRITIN 161 12/28/2014     ASSESSMENT AND PLAN:  IDA (iron deficiency anemia) Secondary to malabsorption after undergoing gastric bypass surgery at [facility] in 2011.   S/P IV Feraheme 510 mg on 05/16/2014 and 05/25/2014.    Ferritin over the past few months has demonstrated a declined, but has not yet dropped below 100.   Labs today: CBC diff, iron/TIBC, ferritin.  Labs in 6 weeks, 12 weeks, and 18 weeks: CBC diff, iron/TIBC, ferritin.  She is following with urology, Dr.  Jeffie Pollock, due to recurrent UTIs.    She declines influenza vaccine as she plans on having this completed within the week with her primary care provider, Dr. Ethlyn Gallery.  Return in 18 weeks for follow-up.    THERAPY PLAN:  Will monitor labs and iron studies and provide IV iron support as needed to maintain a ferritin of 100 or greater.  All questions were answered. The patient knows to call the clinic with any problems, questions or concerns. We can certainly see the patient much sooner if necessary.  Patient and plan discussed with Dr. Ancil Linsey and she is in agreement with the aforementioned.   This note is electronically signed by: Robynn Pane 03/06/2015 9:38 AM

## 2015-03-05 NOTE — Assessment & Plan Note (Addendum)
Secondary to malabsorption after undergoing gastric bypass surgery at [facility] in 2011.   S/P IV Feraheme 510 mg on 05/16/2014 and 05/25/2014.    Ferritin over the past few months has demonstrated a declined, but has not yet dropped below 100.   Labs today: CBC diff, iron/TIBC, ferritin.  Labs in 6 weeks, 12 weeks, and 18 weeks: CBC diff, iron/TIBC, ferritin.  She is following with urology, Dr. Jeffie Pollock, due to recurrent UTIs.    She declines influenza vaccine as she plans on having this completed within the week with her primary care provider, Dr. Ethlyn Gallery.  Return in 18 weeks for follow-up.

## 2015-03-06 ENCOUNTER — Encounter (HOSPITAL_COMMUNITY): Payer: Self-pay | Admitting: Oncology

## 2015-03-06 ENCOUNTER — Encounter (HOSPITAL_BASED_OUTPATIENT_CLINIC_OR_DEPARTMENT_OTHER): Payer: BLUE CROSS/BLUE SHIELD

## 2015-03-06 ENCOUNTER — Encounter (HOSPITAL_COMMUNITY): Payer: BLUE CROSS/BLUE SHIELD | Attending: Oncology | Admitting: Oncology

## 2015-03-06 ENCOUNTER — Other Ambulatory Visit (HOSPITAL_COMMUNITY)
Admission: RE | Admit: 2015-03-06 | Discharge: 2015-03-06 | Disposition: A | Discharge status: Home / Self Care | Payer: BLUE CROSS/BLUE SHIELD | Source: Ambulatory Visit | Attending: Urology | Admitting: Urology

## 2015-03-06 ENCOUNTER — Encounter (HOSPITAL_COMMUNITY): Payer: BLUE CROSS/BLUE SHIELD

## 2015-03-06 VITALS — BP 94/56 | HR 61 | Temp 98.1°F | Resp 18 | Wt 271.4 lb

## 2015-03-06 DIAGNOSIS — D509 Iron deficiency anemia, unspecified: Secondary | ICD-10-CM

## 2015-03-06 DIAGNOSIS — N200 Calculus of kidney: Secondary | ICD-10-CM | POA: Diagnosis not present

## 2015-03-06 DIAGNOSIS — Z9884 Bariatric surgery status: Secondary | ICD-10-CM | POA: Diagnosis not present

## 2015-03-06 DIAGNOSIS — R5383 Other fatigue: Secondary | ICD-10-CM | POA: Diagnosis not present

## 2015-03-06 DIAGNOSIS — K909 Intestinal malabsorption, unspecified: Secondary | ICD-10-CM | POA: Diagnosis not present

## 2015-03-06 LAB — CBC WITH DIFFERENTIAL/PLATELET
BASOS PCT: 0 %
Basophils Absolute: 0 10*3/uL (ref 0.0–0.1)
EOS ABS: 0.3 10*3/uL (ref 0.0–0.7)
EOS PCT: 4 %
HCT: 41.8 % (ref 36.0–46.0)
HEMOGLOBIN: 12.9 g/dL (ref 12.0–15.0)
Lymphocytes Relative: 24 %
Lymphs Abs: 1.7 10*3/uL (ref 0.7–4.0)
MCH: 28.4 pg (ref 26.0–34.0)
MCHC: 30.9 g/dL (ref 30.0–36.0)
MCV: 92.1 fL (ref 78.0–100.0)
Monocytes Absolute: 0.4 10*3/uL (ref 0.1–1.0)
Monocytes Relative: 5 %
NEUTROS PCT: 67 %
Neutro Abs: 4.7 10*3/uL (ref 1.7–7.7)
PLATELETS: 346 10*3/uL (ref 150–400)
RBC: 4.54 MIL/uL (ref 3.87–5.11)
RDW: 13.3 % (ref 11.5–15.5)
WBC: 7 10*3/uL (ref 4.0–10.5)

## 2015-03-06 LAB — IRON AND TIBC
IRON: 60 ug/dL (ref 28–170)
Saturation Ratios: 19 % (ref 10.4–31.8)
TIBC: 308 ug/dL (ref 250–450)
UIBC: 248 ug/dL

## 2015-03-06 LAB — FERRITIN: FERRITIN: 207 ng/mL (ref 11–307)

## 2015-03-06 LAB — BASIC METABOLIC PANEL
Anion gap: 8 (ref 5–15)
BUN: 29 mg/dL — AB (ref 6–20)
CO2: 26 mmol/L (ref 22–32)
Calcium: 8.8 mg/dL — ABNORMAL LOW (ref 8.9–10.3)
Chloride: 105 mmol/L (ref 101–111)
Creatinine, Ser: 1.7 mg/dL — ABNORMAL HIGH (ref 0.44–1.00)
GFR calc Af Amer: 40 mL/min — ABNORMAL LOW (ref >60)
GFR, EST NON AFRICAN AMERICAN: 34 mL/min — AB (ref >60)
GLUCOSE: 106 mg/dL — AB (ref 65–99)
POTASSIUM: 4.6 mmol/L (ref 3.5–5.1)
Sodium: 139 mmol/L (ref 135–145)

## 2015-03-06 LAB — URIC ACID: Uric Acid, Serum: 5.7 mg/dL (ref 2.3–6.6)

## 2015-03-06 LAB — HEMOGLOBIN A1C: HEMOGLOBIN A1C: 6.8 % — AB (ref 4.0–6.0)

## 2015-03-06 NOTE — Patient Instructions (Signed)
[place] [facility] Discharge Instructions  RECOMMENDATIONS MADE BY THE CONSULTANT AND ANY TEST RESULTS WILL BE SENT TO YOUR REFERRING PHYSICIAN.  Exam and discussion by Robynn Pane, PA-C If any concerns with your labs we will contact you. Flu vaccine by PCP  Labs every 6 weeks and office visit in 4 months.  Thank you for choosing [place] at [facility] to provide your oncology and hematology care.  To afford each patient quality time with our provider, please arrive at least 15 minutes before your scheduled appointment time.    You need to re-schedule your appointment should you arrive 10 or more minutes late.  We strive to give you quality time with our providers, and arriving late affects you and other patients whose appointments are after yours.  Also, if you no show three or more times for appointments you may be dismissed from the clinic at the providers discretion.     Again, thank you for choosing [facility].  Our hope is that these requests will decrease the amount of time that you wait before being seen by our physicians.       _____________________________________________________________  Should you have questions after your visit to [facility], please contact our office at [phone] between the hours of 8:30 a.m. and 4:30 p.m.  Voicemails left after 4:30 p.m. will not be returned until the following business day.  For prescription refill requests, have your pharmacy contact our office.

## 2015-03-07 LAB — PARATHYROID HORMONE, INTACT (NO CA): PTH: 64 pg/mL (ref 15–65)

## 2015-03-07 NOTE — Progress Notes (Signed)
LABS DRAWN

## 2015-03-09 ENCOUNTER — Encounter: Payer: Self-pay | Admitting: "Endocrinology

## 2015-03-09 ENCOUNTER — Ambulatory Visit (INDEPENDENT_AMBULATORY_CARE_PROVIDER_SITE_OTHER): Payer: BLUE CROSS/BLUE SHIELD | Admitting: "Endocrinology

## 2015-03-09 VITALS — BP 139/90 | HR 70 | Ht 67.0 in | Wt 269.0 lb

## 2015-03-09 DIAGNOSIS — E042 Nontoxic multinodular goiter: Secondary | ICD-10-CM | POA: Diagnosis not present

## 2015-03-09 DIAGNOSIS — E119 Type 2 diabetes mellitus without complications: Secondary | ICD-10-CM | POA: Insufficient documentation

## 2015-03-09 DIAGNOSIS — E1159 Type 2 diabetes mellitus with other circulatory complications: Secondary | ICD-10-CM | POA: Insufficient documentation

## 2015-03-09 DIAGNOSIS — I10 Essential (primary) hypertension: Secondary | ICD-10-CM | POA: Insufficient documentation

## 2015-03-09 NOTE — Patient Instructions (Signed)

## 2015-03-10 NOTE — Progress Notes (Signed)
Subjective:    Patient ID: Molly Berry, female    DOB: 12-15-1965,    Past Medical History  Diagnosis Date  . Depression   . Fibromyalgia   . Diabetes mellitus   . PONV (postoperative nausea and vomiting)   . Asthma   . Endometrial cancer ([place])   . Uterine cancer ([place]) 12/26/2011    Stage 1, grade 1, S/P hysterectomy initially by robotic technique and then salping[facility]oophorectomy at [facility] receiving no postoperative treatment.  . Thyroid disease   . Hypothyroidism   . Multiple thyroid nodules   . GERD (gastroesophageal reflux disease)   . Ulcer   . Pulmonary nodules   . COPD (chronic obstructive pulmonary disease) ([place])   . 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 Chronic kidney disease    Past Surgical History  Procedure Laterality Date  . Gastric bypass  2013    [facility]  . Cholecystectomy    . Dilation and curettage of uterus  12 yrs ago  . Hysteroscopy w/d&c  06/18/2011    Procedure: DILATATION AND CURETTAGE /HYSTEROSCOPY;  Surgeon: Florian Buff, MD;  Location: [facility];  Service: Gynecology;  Laterality: N/A;  . Dilation and curettage of uterus  06/18/2011    Procedure: DILATATION AND CURETTAGE;  Surgeon: Florian Buff, MD;  Location: [facility];  Service: Gynecology;  Laterality: N/A;  Suction Dilation and Curettage  . Abdominal hysterectomy    . Esophagogastroduodenoscopy N/A 10/12/2013    Dr. Rourk:anastomotic ulcer likely cause of bleeding. likely ischemic   . Lithotripsy    . Esophagogastroduodenoscopy (egd) with propofol N/A 01/19/2014    Procedure: ESOPHAGOGASTRODUODENOSCOPY (EGD) WITH PROPOFOL;  Surgeon: Daneil Dolin, MD;  Location: [facility];  Service: Endoscopy;  Laterality: N/A;  . Colonoscopy with propofol N/A 01/19/2014    Procedure: COLONOSCOPY WITH PROPOFOL;  Surgeon: Daneil Dolin, MD;  Location: [facility];  Service: Endoscopy;  Laterality: N/A;  in cecum at 1004; withdrawal time 9 minutes   Social History   Social History  . Marital Status: Married   Spouse Name: N/A  . Number of Children: N/A  . Years of Education: N/A   Occupational History  . seamstress(self employed)    Social History Main Topics  . Smoking status: Never Smoker   . Smokeless tobacco: Never Used  . Alcohol Use: No  . Drug Use: No  . Sexual Activity: Yes    Birth Control/ Protection: None, Surgical   Other Topics Concern  . None   Social History Narrative   Lives with husband and 2 sons.     Wants to work but says she is not dependable.  She does do volunteer work.    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  Working on disability.     Education: bachelors degree.      Outpatient Encounter Prescriptions as of 03/09/2015  Medication Sig  . Oxycodone HCl 10 MG TABS Take 10 mg by mouth 2 (two) times daily as needed.  . pantoprazole (PROTONIX) 40 MG tablet Take 1 tablet (40 mg total) by mouth daily.  . sucralfate (CARAFATE) 1 GM/10ML suspension TAKE 2 TEASPOONFULS BEFORE MEALS AND AT BEDTIME.  . SUMAtriptan (IMITREX) 100 MG tablet Take 100 mg by mouth as needed for migraine.   Marland Kitchen tiZANidine (ZANAFLEX) 4 MG tablet Take 4 mg by mouth at bedtime. For spasms.  Marland Kitchen albuterol (PROVENTIL HFA;VENTOLIN HFA) 108 (90 BASE) MCG/ACT inhaler Inhale 2 puffs into the  lungs every 6 (six) hours as needed. For asthma   . ALPRAZolam (XANAX) 1 MG tablet Take 1 mg by mouth 3 (three) times daily as needed. FOR ANXIETY AND SLEEP  . atorvastatin (LIPITOR) 40 MG tablet TAKE 1 TABLET BY MOUTH AT BEDTIME.  . clotrimazole-betamethasone (LOTRISONE) cream Apply 1 application topically as needed (for skin irritation).   . cyanocobalamin (,VITAMIN B-12,) 1000 MCG/ML injection Inject 1,000 mcg into the muscle every 30 (thirty) days.   . diphenhydrAMINE (BENADRYL) 25 mg capsule Take 25-75 mg by mouth See admin instructions. Taking 1 Capsule daily as needed for allergic reaction and takes 3 capsules at bedtime  . DULoxetine (CYMBALTA) 60 MG capsule Take 120 mg by mouth every morning.   .  ergocalciferol (VITAMIN D2) 50000 UNITS capsule Take 1 capsule (50,000 Units total) by mouth once a week. (Patient taking differently: Take 50,000 Units by mouth 2 (two) times a week. )  . furosemide (LASIX) 20 MG tablet Take 20 mg by mouth as needed.  . gabapentin (NEURONTIN) 600 MG tablet Take 600 mg by mouth at bedtime.   . lamoTRIgine (LAMICTAL) 100 MG tablet Take 100 mg by mouth 2 (two) times daily.   Marland Kitchen lisinopril (PRINIVIL,ZESTRIL) 40 MG tablet 40 mg daily as needed. Take when blood pressure "spikes" per pt  . LORazepam (ATIVAN) 1 MG tablet Take 1 tablet (1 mg total) by mouth 3 (three) times daily as needed for anxiety (or itching).  . Melatonin 10 MG TABS Take 1 tablet by mouth at bedtime.   . meloxicam (MOBIC) 7.5 MG tablet Take 7.5 mg by mouth daily.   . Methylphenidate HCl ER 54 MG TB24 Take 1 capsule by mouth daily.   . Multiple Vitamins-Minerals (MULTIVITAMIN WITH MINERALS) tablet Take 1 tablet by mouth every morning.   . nitrofurantoin, macrocrystal-monohydrate, (MACROBID) 100 MG capsule Take 100 mg by mouth 2 (two) times daily.  Marland Kitchen Nystatin ([place]) 100000 UNIT/GM POWD Apply topically as needed (for skin iritation).   . ondansetron (ZOFRAN) 8 MG tablet Take 8 mg by mouth every 8 (eight) hours as needed for nausea or vomiting.   . [DISCONTINUED] INVOKANA 100 MG TABS tablet TAKE (1) TABLET BY MOUTH ONCE DAILY.  . [DISCONTINUED] predniSONE (DELTASONE) 50 MG tablet Take 1 tablet (50 mg total) by mouth daily. (Patient not taking: Reported on 03/06/2015)   No facility-administered encounter medications on file as of 03/09/2015.   ALLERGIES: Allergies  Allergen Reactions  . Bee Venom Shortness Of Breath, Swelling and Rash  . Cephalexin Anaphylaxis  . Vancomycin Anaphylaxis and Hives  . Cefotaxime   . Penicillins Hives  . Adhesive [Tape] Rash  . Latex Rash  . Sulfa Antibiotics Rash   VACCINATION STATUS: Immunization History  Administered Date(s) Administered  . Influenza Whole  02/03/2012  . Influenza-Unspecified 02/15/2014    Diabetes She presents for her follow-up diabetic visit. She has type 2 diabetes mellitus. Onset time: She was diagnosed at approximate age of 57. Her disease course has been stable. There are no hypoglycemic associated symptoms. Pertinent negatives for hypoglycemia include no confusion, headaches, pallor or seizures. There are no diabetic associated symptoms. Pertinent negatives for diabetes include no chest pain, no fatigue, no polydipsia, no polyphagia and no polyuria. There are no hypoglycemic complications. Symptoms are stable. There are no diabetic complications. Risk factors for coronary artery disease include diabetes mellitus, dyslipidemia, hypertension, obesity and sedentary lifestyle. She is compliant with treatment most of the time (She is taken off of Invokana due to recent  decline in renal function secondary to 2 attacks of nephrolithiasis). Her weight is stable. She is following a generally unhealthy diet. She has had a previous visit with a dietitian. There is no change in her home blood glucose trend. An ACE inhibitor/angiotensin II receptor blocker is being taken.  Hyperlipidemia This is a chronic problem. The current episode started more than 1 year ago. The problem is controlled. Pertinent negatives include no chest pain, myalgias or shortness of breath. Current antihyperlipidemic treatment includes statins. Risk factors for coronary artery disease include dyslipidemia, diabetes mellitus, hypertension, obesity and a sedentary lifestyle.  Hypertension Pertinent negatives include no chest pain, headaches, palpitations or shortness of breath.     Review of Systems  Constitutional: Negative for fatigue and unexpected weight change.  HENT: Negative for trouble swallowing and voice change.   Eyes: Negative for visual disturbance.  Respiratory: Negative for cough, shortness of breath and wheezing.   Cardiovascular: Negative for chest  pain, palpitations and leg swelling.  Gastrointestinal: Negative for nausea, vomiting and diarrhea.  Endocrine: Negative for cold intolerance, heat intolerance, polydipsia, polyphagia and polyuria.  Musculoskeletal: Negative for myalgias and arthralgias.  Skin: Negative for color change, pallor, rash and wound.  Neurological: Negative for seizures and headaches.  Psychiatric/Behavioral: Negative for suicidal ideas and confusion.    Objective:    BP 139/90 mmHg  Pulse 70  Ht 5\' 7"  (1.702 m)  Wt 269 lb (122.018 kg)  BMI 42.12 kg/m2  SpO2 98%  LMP 05/06/2011  Wt Readings from Last 3 Encounters:  03/09/15 269 lb (122.018 kg)  03/06/15 271 lb 6.4 oz (123.106 kg)  01/15/15 265 lb (120.203 kg)    Physical Exam  Constitutional: She is oriented to person, place, and time. She appears well-developed.  HENT:  Head: Normocephalic and atraumatic.  Eyes: EOM are normal.  Neck: Normal range of motion. Neck supple. No tracheal deviation present. No thyromegaly present.  Cardiovascular: Normal rate and regular rhythm.   Pulmonary/Chest: Effort normal and breath sounds normal.  Abdominal: Soft. Bowel sounds are normal. There is no tenderness. There is no guarding.  Musculoskeletal: Normal range of motion. She exhibits no edema.  Neurological: She is alert and oriented to person, place, and time. She has normal reflexes. No cranial nerve deficit. Coordination normal.  Skin: Skin is warm and dry. No rash noted. No erythema. No pallor.  Psychiatric: She has a normal mood and affect. Judgment normal.    Results for orders placed or performed in visit on 03/09/15  Hemoglobin A1c  Result Value Ref Range   Hgb A1c MFr Bld 6.8 (A) 4.0 - 6.0 %   Complete Blood Count (Most recent): Lab Results  Component Value Date   WBC 7.0 03/06/2015   HGB 12.9 03/06/2015   HCT 41.8 03/06/2015   MCV 92.1 03/06/2015   PLT 346 03/06/2015   Chemistry (most recent): Lab Results  Component Value Date   NA 139  03/06/2015   K 4.6 03/06/2015   CL 105 03/06/2015   CO2 26 03/06/2015   BUN 29* 03/06/2015   CREATININE 1.70* 03/06/2015      Assessment & Plan:   1. Diabetes mellitus without complication ([place])  her diabetes is  complicated by acute renal failure (which she explains is due to pyelonephritis and nephrolithiasis of recent attack) and patient remains at a high risk for more acute and chronic complications of diabetes which include CAD, CVA, CKD, retinopathy, and neuropathy. These are all discussed in detail with the patient.  Patient came with acute glucose profile, and  recent A1c of 6.8 %.   Recent labs reviewed.   - I have re-counseled the patient on diet management and weight loss  by adopting a carbohydrate restricted / protein rich  Diet.  - Suggestion is made for patient to avoid simple carbohydrates   from their diet including Cakes , Desserts, Ice Cream,  Soda (  diet and regular) , Sweet Tea , Candies,  Chips, Cookies, Artificial Sweeteners,   and "Sugar-free" Products .  This will help patient to have stable blood glucose profile and potentially avoid unintended  Weight gain.  - Patient is advised to stick to a routine mealtimes to eat 3 meals  a day and avoid unnecessary snacks ( to snack only to correct hypoglycemia).  - The patient  will be  scheduled with Jearld Fenton, RDN, CDE for individualized DM education.  - I have approached patient with the following individualized plan to manage diabetes and patient agrees. -She is advised to hold her Invokana for now until she recovers from the acute renal failure.  -He will not need insulin therapy for now. She will be treated with insulin therapy if necessary when she returns in 3 months with repeat A1c.  - Patient specific target  for A1c; LDL, HDL, Triglycerides, and  Waist Circumference were discussed in detail.  2) BP/HTN: Controlled. Continue current medications  3) Lipids/HPL:  continue statins. 4)  Weight/Diet: CDE  consult in progress, exercise, and carbohydrates information provided.  5) Nontoxic multinodular goiter 6) Chronic Care/Health Maintenance:  -Patient is on Statin medications and encouraged to continue to follow up with Ophthalmology, Podiatrist at least yearly or according to recommendations, and advised to  stay away from smoking. I have recommended yearly flu vaccine and pneumonia vaccination at least every 5 years; moderate intensity exercise for up to 150 minutes weekly; and  sleep for at least 7 hours a day.  I advised patient to maintain close follow up with their PCP for primary care needs.  Patient is asked to bring meter and  blood glucose logs during their next visit.   Follow up plan: Return in about 3 months (around 06/09/2015) for diabetes, nodular goiter.  Glade Lloyd, MD Phone: [phone]  Fax: [phone]   03/10/2015, 8:36 PM

## 2015-03-21 ENCOUNTER — Ambulatory Visit (INDEPENDENT_AMBULATORY_CARE_PROVIDER_SITE_OTHER): Payer: BLUE CROSS/BLUE SHIELD | Admitting: Urology

## 2015-03-21 DIAGNOSIS — N3020 Other chronic cystitis without hematuria: Secondary | ICD-10-CM

## 2015-03-21 DIAGNOSIS — N200 Calculus of kidney: Secondary | ICD-10-CM

## 2015-04-13 ENCOUNTER — Ambulatory Visit (INDEPENDENT_AMBULATORY_CARE_PROVIDER_SITE_OTHER): Payer: BLUE CROSS/BLUE SHIELD | Admitting: Urology

## 2015-04-13 DIAGNOSIS — Z87448 Personal history of other diseases of urinary system: Secondary | ICD-10-CM

## 2015-04-13 DIAGNOSIS — N200 Calculus of kidney: Secondary | ICD-10-CM

## 2015-04-13 DIAGNOSIS — M545 Low back pain: Secondary | ICD-10-CM

## 2015-04-16 ENCOUNTER — Encounter: Payer: Self-pay | Admitting: Gastroenterology

## 2015-04-16 ENCOUNTER — Ambulatory Visit: Payer: BLUE CROSS/BLUE SHIELD | Admitting: Gastroenterology

## 2015-04-16 ENCOUNTER — Telehealth: Payer: Self-pay | Admitting: Gastroenterology

## 2015-04-16 NOTE — Telephone Encounter (Signed)
PATIENT WAS A NO SHOW AND LETTER SENT  °

## 2015-04-17 ENCOUNTER — Encounter (HOSPITAL_COMMUNITY): Payer: BLUE CROSS/BLUE SHIELD | Attending: Oncology

## 2015-04-17 DIAGNOSIS — D509 Iron deficiency anemia, unspecified: Secondary | ICD-10-CM | POA: Insufficient documentation

## 2015-04-25 ENCOUNTER — Ambulatory Visit: Payer: BLUE CROSS/BLUE SHIELD | Admitting: Urology

## 2015-04-26 ENCOUNTER — Encounter (HOSPITAL_COMMUNITY): Payer: Self-pay

## 2015-05-06 ENCOUNTER — Emergency Department (HOSPITAL_COMMUNITY)
Admission: EM | Admit: 2015-05-06 | Discharge: 2015-05-06 | Disposition: A | Discharge status: Home / Self Care | Payer: BLUE CROSS/BLUE SHIELD | Attending: Emergency Medicine | Admitting: Emergency Medicine

## 2015-05-06 ENCOUNTER — Encounter (HOSPITAL_COMMUNITY): Payer: Self-pay | Admitting: *Deleted

## 2015-05-06 DIAGNOSIS — R531 Weakness: Secondary | ICD-10-CM | POA: Insufficient documentation

## 2015-05-06 DIAGNOSIS — R200 Anesthesia of skin: Secondary | ICD-10-CM | POA: Diagnosis present

## 2015-05-06 DIAGNOSIS — Z791 Long term (current) use of non-steroidal anti-inflammatories (NSAID): Secondary | ICD-10-CM | POA: Diagnosis not present

## 2015-05-06 DIAGNOSIS — Z79891 Long term (current) use of opiate analgesic: Secondary | ICD-10-CM | POA: Insufficient documentation

## 2015-05-06 DIAGNOSIS — Z880 Allergy status to penicillin: Secondary | ICD-10-CM | POA: Diagnosis not present

## 2015-05-06 DIAGNOSIS — Z79899 Other long term (current) drug therapy: Secondary | ICD-10-CM | POA: Diagnosis not present

## 2015-05-06 DIAGNOSIS — M797 Fibromyalgia: Secondary | ICD-10-CM | POA: Insufficient documentation

## 2015-05-06 DIAGNOSIS — G510 Bell's palsy: Secondary | ICD-10-CM | POA: Diagnosis not present

## 2015-05-06 DIAGNOSIS — Z91040 Latex allergy status: Secondary | ICD-10-CM | POA: Diagnosis not present

## 2015-05-06 DIAGNOSIS — Z792 Long term (current) use of antibiotics: Secondary | ICD-10-CM | POA: Insufficient documentation

## 2015-05-06 DIAGNOSIS — Z8542 Personal history of malignant neoplasm of other parts of uterus: Secondary | ICD-10-CM | POA: Insufficient documentation

## 2015-05-06 DIAGNOSIS — E119 Type 2 diabetes mellitus without complications: Secondary | ICD-10-CM | POA: Insufficient documentation

## 2015-05-06 DIAGNOSIS — F329 Major depressive disorder, single episode, unspecified: Secondary | ICD-10-CM | POA: Diagnosis not present

## 2015-05-06 DIAGNOSIS — N189 Chronic kidney disease, unspecified: Secondary | ICD-10-CM | POA: Insufficient documentation

## 2015-05-06 DIAGNOSIS — J449 Chronic obstructive pulmonary disease, unspecified: Secondary | ICD-10-CM | POA: Diagnosis not present

## 2015-05-06 DIAGNOSIS — K219 Gastro-esophageal reflux disease without esophagitis: Secondary | ICD-10-CM | POA: Insufficient documentation

## 2015-05-06 HISTORY — DX: Bell's palsy: G51.0

## 2015-05-06 LAB — CBG MONITORING, ED: Glucose-Capillary: 139 mg/dL — ABNORMAL HIGH (ref 65–99)

## 2015-05-06 MED ORDER — PREDNISONE 50 MG PO TABS
60.0000 mg | ORAL_TABLET | Freq: Once | ORAL | Status: AC
  Administered 2015-05-06: 60 mg via ORAL
  Filled 2015-05-06: qty 1

## 2015-05-06 MED ORDER — ARTIFICIAL TEARS OP OINT
TOPICAL_OINTMENT | Freq: Once | OPHTHALMIC | Status: AC
  Administered 2015-05-06: 02:00:00 via OPHTHALMIC
  Filled 2015-05-06: qty 3.5

## 2015-05-06 MED ORDER — PREDNISONE 50 MG PO TABS: ORAL_TABLET | ORAL | Status: DC

## 2015-05-06 MED ORDER — ARTIFICIAL TEARS OP OINT: TOPICAL_OINTMENT | OPHTHALMIC | Status: DC | PRN

## 2015-05-06 NOTE — ED Notes (Signed)
MD at bedside. 

## 2015-05-06 NOTE — ED Provider Notes (Signed)
CSN: [id number]     Arrival date & time 05/06/15  0106 History   First MD Initiated Contact with Patient 05/06/15 0122     Chief Complaint  Patient presents with  . Numbness    Patient is a 50 y.o. female presenting with weakness. The history is provided by the patient and the spouse.  Weakness This is a new problem. The current episode started 12 to 24 hours ago. The problem occurs constantly. The problem has been gradually worsening. Pertinent negatives include no chest pain, no abdominal pain, no headaches and no shortness of breath. Nothing aggravates the symptoms. Nothing relieves the symptoms.  pt reports yesterday morning she had onset of right facial weakness and difficulty closing right eye She reports recent difficulty with her taste and some numbness in her mouth No HA/dizziness at this time No cp/sob No new weakness/numbness in her arms/legs She reports 2 episodes of bell's palsy in the past with last episode 5 yrs ago   Past Medical History  Diagnosis Date  . Depression   . Fibromyalgia   . Diabetes mellitus   . PONV (postoperative nausea and vomiting)   . Asthma   . Endometrial cancer ([place])   . Uterine cancer ([place]) 12/26/2011    Stage 1, grade 1, S/P hysterectomy initially by robotic technique and then sal[facility]oophorectomy at [facility] receiving no postoperative treatment.  . Thyroid disease   . Hypothyroidism   . Multiple thyroid nodules   . GERD (gastroesophageal reflux disease)   . Ulcer   . Pulmonary nodules   . COPD (chronic obstructive pulmonary disease) ([place])   . 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 Chronic kidney disease   . Bell palsy    Past Surgical History  Procedure Laterality Date  . Gastric bypass  2013    [facility]  . Cholecystectomy    . Dilation and curettage of uterus  12 yrs ago  . Hysteroscopy w/d&c  06/18/2011    Procedure: DILATATION AND CURETTAGE /HYSTEROSCOPY;  Surgeon: Florian Buff, MD;  Location: [facility];  Service: Gynecology;   Laterality: N/A;  . Dilation and curettage of uterus  06/18/2011    Procedure: DILATATION AND CURETTAGE;  Surgeon: Florian Buff, MD;  Location: [facility];  Service: Gynecology;  Laterality: N/A;  Suction Dilation and Curettage  . Abdominal hysterectomy    . Esophagogastroduodenoscopy N/A 10/12/2013    Dr. Rourk:anastomotic ulcer likely cause of bleeding. likely ischemic   . Lithotripsy    . Esophagogastroduodenoscopy (egd) with propofol N/A 01/19/2014    [id number]  . Colonoscopy with propofol N/A 01/19/2014    [id number]   Family History  Problem Relation Age of Onset  . Anesthesia problems Neg Hx   . Hypotension Neg Hx   . Malignant hyperthermia Neg Hx   . Pseudochol deficiency Neg Hx   . Colon cancer Neg Hx   . Pneumonia Mother     Deceased  . Liver cancer Father     Living  . Mental illness Sister    Social History  Substance Use Topics  . Smoking status: Never Smoker   . Smokeless tobacco: Never Used  . Alcohol Use: No   OB History    No data available     Review of Systems  Constitutional: Negative for fever.  HENT: Negative for hearing loss.   Respiratory: Negative for shortness of breath.   Cardiovascular: Negative for chest pain.  Gastrointestinal: Negative for abdominal pain.  Neurological: Positive for  weakness. Negative for headaches.  All other systems reviewed and are negative.     Allergies  Bee venom; Cephalexin; Vancomycin; Cefotaxime; Penicillins; Adhesive; Latex; and Sulfa antibiotics  Home Medications   Prior to Admission medications   Medication Sig Start Date End Date Taking? Authorizing Provider  albuterol (PROVENTIL HFA;VENTOLIN HFA) 108 (90 BASE) MCG/ACT inhaler Inhale 2 puffs into the lungs every 6 (six) hours as needed. For asthma    Yes Historical Provider, MD  ALPRAZolam Duanne Moron) 1 MG tablet Take 1 mg by mouth 3 (three) times daily as needed. FOR ANXIETY AND SLEEP   Yes Historical Provider, MD  atorvastatin (LIPITOR) 40 MG tablet TAKE  1 TABLET BY MOUTH AT BEDTIME. 02/19/15  Yes Cassandria Anger, MD  clotrimazole-betamethasone (LOTRISONE) cream Apply 1 application topically as needed (for skin irritation).  08/25/13  Yes Historical Provider, MD  cyanocobalamin (,VITAMIN B-12,) 1000 MCG/ML injection Inject 1,000 mcg into the muscle every 30 (thirty) days.    Yes Historical Provider, MD  diphenhydrAMINE (BENADRYL) 25 mg capsule Take 25-75 mg by mouth See admin instructions. Taking 1 Capsule daily as needed for allergic reaction and takes 3 capsules at bedtime   Yes Historical Provider, MD  DULoxetine (CYMBALTA) 60 MG capsule Take 120 mg by mouth every morning.    Yes Historical Provider, MD  ergocalciferol (VITAMIN D2) 50000 UNITS capsule Take 1 capsule (50,000 Units total) by mouth once a week. Patient taking differently: Take 50,000 Units by mouth 2 (two) times a week.  11/01/14  Yes Manon Hilding Kefalas, PA-C  furosemide (LASIX) 20 MG tablet Take 20 mg by mouth as needed. 02/15/14  Yes Historical Provider, MD  gabapentin (NEURONTIN) 600 MG tablet Take 600 mg by mouth at bedtime.  01/14/14  Yes Historical Provider, MD  lamoTRIgine (LAMICTAL) 100 MG tablet Take 100 mg by mouth 2 (two) times daily.    Yes Historical Provider, MD  lisinopril (PRINIVIL,ZESTRIL) 40 MG tablet 40 mg daily as needed. Take when blood pressure "spikes" per pt 03/27/14  Yes Historical Provider, MD  LORazepam (ATIVAN) 1 MG tablet Take 1 tablet (1 mg total) by mouth 3 (three) times daily as needed for anxiety (or itching). [id number]  Yes Delora Fuel, MD  Melatonin 10 MG TABS Take 1 tablet by mouth at bedtime.    Yes Historical Provider, MD  meloxicam (MOBIC) 7.5 MG tablet Take 7.5 mg by mouth daily.  01/11/15  Yes Historical Provider, MD  Methylphenidate HCl ER 54 MG TB24 Take 1 capsule by mouth daily.  06/30/14  Yes Historical Provider, MD  morphine (MSIR) 15 MG tablet Take 15 mg by mouth 3 (three) times daily.   Yes Historical Provider, MD  Multiple Vitamins-Minerals  (MULTIVITAMIN WITH MINERALS) tablet Take 1 tablet by mouth every morning.    Yes Historical Provider, MD  nitrofurantoin, macrocrystal-monohydrate, (MACROBID) 100 MG capsule Take 100 mg by mouth 2 (two) times daily. 08/02/14  Yes Historical Provider, MD  Nystatin ([place]) 100000 UNIT/GM POWD Apply topically as needed (for skin iritation).  04/13/14  Yes Historical Provider, MD  ondansetron (ZOFRAN) 8 MG tablet Take 8 mg by mouth every 8 (eight) hours as needed for nausea or vomiting.  11/24/13  Yes Historical Provider, MD  Oxycodone HCl 10 MG TABS Take 10 mg by mouth 2 (two) times daily as needed. 02/28/15  Yes Historical Provider, MD  pantoprazole (PROTONIX) 40 MG tablet Take 1 tablet (40 mg total) by mouth daily. 11/02/14  Yes Mahala Menghini, PA-C  sucralfate ([place])  1 GM/10ML suspension TAKE 2 TEASPOONFULS BEFORE MEALS AND AT BEDTIME. 01/31/15  Yes Orvil Feil, NP  SUMAtriptan (IMITREX) 100 MG tablet Take 100 mg by mouth as needed for migraine.  02/18/13  Yes Historical Provider, MD  tiZANidine (ZANAFLEX) 4 MG tablet Take 4 mg by mouth at bedtime. For spasms.   Yes Historical Provider, MD   BP 121/63 mmHg  Pulse 72  Temp(Src) 98.3 F (36.8 C) (Oral)  Resp 16  Ht 5\' 4"  (1.626 m)  Wt 121.11 kg  BMI 45.81 kg/m2  SpO2 97%  LMP 05/06/2011 Physical Exam CONSTITUTIONAL: Well developed/well nourished HEAD: Normocephalic/atraumatic EYES: EOMI/PERRL, no nystagmus,no ptosis ENMT: Mucous membranes moist, right TM intact without erythema/lesions NECK: supple no meningeal signs, no bruits CV: S1/S2 noted, no murmurs/rubs/gallops noted LUNGS: Lungs are clear to auscultation bilaterally, no apparent distress ABDOMEN: soft, nontender, no rebound or guarding GU:no cva tenderness NEURO:Awake/alert, right facial droop noted and difficulty closing right eyelid.  She has weakness with movement of right forehead.  No facial numbness.  Tongue midline. no arm or leg drift is noted Equal 5/5 strength with  shoulder abduction, elbow flex/extension, wrist flex/extension in upper extremities and equal hand grips bilaterally Equal 5/5 strength with hip flexion,knee flex/extension, foot dorsi/plantar flexion Aside from facial nerve, all other cranial nerves intact Gait normal without ataxia No past pointing Sensation to light touch intact in all extremities EXTREMITIES: pulses normal, full ROM SKIN: warm, color normal, no rash to face PSYCH: no abnormalities of mood noted  ED Course  Procedures  1:55 AM Pt with h/o bells palsy appears to have recurrent episode Pt able to swallow without difficulty No signs of acute CVA Stable for d/c home Discussed return precautions PCP f/u in one week  Labs Review Labs Reviewed  CBG MONITORING, ED - Abnormal; Notable for the following:    Glucose-Capillary 139 (*)    All other components within normal limits    I have personally reviewed and evaluated these  lab results as part of my medical decision-making.   Medications  artificial tears (LACRILUBE) ophthalmic ointment (not administered)  predniSONE (DELTASONE) tablet 60 mg (60 mg Oral Given 05/06/15 0153)    MDM   Final diagnoses:  Right-sided Bell's palsy    Nursing notes including past medical history and social history reviewed and considered in documentation Labs/vital reviewed myself and considered during evaluation     Ripley Fraise, MD 05/06/15 [phone]

## 2015-05-06 NOTE — Discharge Instructions (Signed)
Bell Palsy °Bell palsy is a condition in which the muscles on one side of the face become paralyzed. This often causes one side of the face to droop. It is a common condition and most people recover completely. °RISK FACTORS °Risk factors for Bell palsy include: °· Pregnancy. °· Diabetes. °· An infection by a virus, such as infections that cause cold sores. °CAUSES  °Bell palsy is caused by damage to or inflammation of a nerve in your face. It is unclear why this happens, but an infection by a virus may lead to it. Most of the time the reason it happens is unknown. °SIGNS AND SYMPTOMS  °Symptoms can range from mild to severe and can take place over a number of hours. Symptoms may include: °· Being unable to: °¨ Raise one or both eyebrows. °¨ Close one or both eyes. °¨ Feel parts of your face (facial numbness). °· Drooping of the eyelid and corner of the mouth. °· Weakness in the face. °· Paralysis of half your face. °· Loss of taste. °· Sensitivity to loud noises. °· Difficulty chewing. °· Tearing up of the affected eye. °· Dryness in the affected eye. °· Drooling. °· Pain behind one ear. °DIAGNOSIS  °Diagnosis of Bell palsy may include: °· A medical history and physical exam. °· An MRI. °· A CT scan. °· Electromyography (EMG). This is a test that checks how your nerves are working. °TREATMENT  °Treatment may include antiviral medicine to help shorten the length of the condition. Sometimes treatment is not needed and the symptoms go away on their own. °HOME CARE INSTRUCTIONS  °· Take medicines only as directed by your health care provider. °· Do facial massages and exercises as directed by your health care provider. °· If your eye is affected: °¨ Use moisturizing eye drops to prevent drying of your eye as directed by your health care provider. °¨ Protect your eye as directed by your health care provider. °SEEK MEDICAL CARE IF: °· Your symptoms do not get better or get worse. °· You are drooling. °· Your eye is red,  irritated, or hurts. °SEEK IMMEDIATE MEDICAL CARE IF:  °· Another part of your body feels weak or numb. °· You have difficulty swallowing. °· You have a fever along with symptoms of Bell palsy. °· You develop neck pain. °MAKE SURE YOU:  °· Understand these instructions. °· Will watch your condition. °· Will get help right away if you are not doing well or get worse. °  °This information is not intended to replace advice given to you by your health care provider. Make sure you discuss any questions you have with your health care provider. °  °Document Released: 04/21/2005 Document Revised: 01/10/2015 Document Reviewed: 07/29/2013 °Elsevier Interactive Patient Education ©2016 Elsevier Inc. ° °

## 2015-05-06 NOTE — ED Notes (Signed)
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 States symptoms are similar to past. Per pt's husband facial assymmatry started at 0700 05/05/2015.

## 2015-05-06 NOTE — ED Notes (Signed)
Pt states the right side of face is numb. Says tongue not tasting food right. Right eye hurting now.

## 2015-05-09 ENCOUNTER — Other Ambulatory Visit: Payer: Self-pay | Admitting: Neurology

## 2015-05-09 ENCOUNTER — Other Ambulatory Visit (HOSPITAL_COMMUNITY): Payer: Self-pay

## 2015-05-09 DIAGNOSIS — G510 Bell's palsy: Secondary | ICD-10-CM

## 2015-05-09 DIAGNOSIS — D509 Iron deficiency anemia, unspecified: Secondary | ICD-10-CM

## 2015-05-18 ENCOUNTER — Ambulatory Visit (HOSPITAL_COMMUNITY)
Admission: RE | Admit: 2015-05-18 | Discharge: 2015-05-18 | Disposition: A | Discharge status: Home / Self Care | Payer: BLUE CROSS/BLUE SHIELD | Source: Ambulatory Visit | Attending: Neurology | Admitting: Neurology

## 2015-05-18 DIAGNOSIS — G510 Bell's palsy: Secondary | ICD-10-CM | POA: Diagnosis not present

## 2015-05-18 LAB — POCT I-STAT CREATININE: CREATININE: 1 mg/dL (ref 0.44–1.00)

## 2015-05-18 MED ORDER — GADOBENATE DIMEGLUMINE 529 MG/ML IV SOLN
20.0000 mL | Freq: Once | INTRAVENOUS | Status: AC | PRN
  Administered 2015-05-18: 20 mL via INTRAVENOUS

## 2015-05-29 ENCOUNTER — Encounter (HOSPITAL_COMMUNITY): Payer: BLUE CROSS/BLUE SHIELD | Attending: Oncology

## 2015-05-29 DIAGNOSIS — D509 Iron deficiency anemia, unspecified: Secondary | ICD-10-CM

## 2015-05-29 LAB — FERRITIN: FERRITIN: 217 ng/mL (ref 11–307)

## 2015-05-29 LAB — CBC WITH DIFFERENTIAL/PLATELET
BASOS ABS: 0 10*3/uL (ref 0.0–0.1)
BASOS PCT: 0 %
EOS ABS: 0.2 10*3/uL (ref 0.0–0.7)
Eosinophils Relative: 3 %
HEMATOCRIT: 40.7 % (ref 36.0–46.0)
HEMOGLOBIN: 12.9 g/dL (ref 12.0–15.0)
Lymphocytes Relative: 18 %
Lymphs Abs: 1.3 10*3/uL (ref 0.7–4.0)
MCH: 29.6 pg (ref 26.0–34.0)
MCHC: 31.7 g/dL (ref 30.0–36.0)
MCV: 93.3 fL (ref 78.0–100.0)
MONO ABS: 0.4 10*3/uL (ref 0.1–1.0)
Monocytes Relative: 5 %
NEUTROS ABS: 5.4 10*3/uL (ref 1.7–7.7)
NEUTROS PCT: 74 %
Platelets: 298 10*3/uL (ref 150–400)
RBC: 4.36 MIL/uL (ref 3.87–5.11)
RDW: 12.6 % (ref 11.5–15.5)
WBC: 7.4 10*3/uL (ref 4.0–10.5)

## 2015-05-29 LAB — IRON AND TIBC
IRON: 43 ug/dL (ref 28–170)
Saturation Ratios: 14 % (ref 10.4–31.8)
TIBC: 304 ug/dL (ref 250–450)
UIBC: 261 ug/dL

## 2015-06-06 ENCOUNTER — Ambulatory Visit (HOSPITAL_COMMUNITY)
Admission: RE | Admit: 2015-06-06 | Discharge: 2015-06-06 | Disposition: A | Discharge status: Home / Self Care | Payer: BLUE CROSS/BLUE SHIELD | Source: Ambulatory Visit | Attending: "Endocrinology | Admitting: "Endocrinology

## 2015-06-06 DIAGNOSIS — R590 Localized enlarged lymph nodes: Secondary | ICD-10-CM | POA: Insufficient documentation

## 2015-06-06 DIAGNOSIS — E119 Type 2 diabetes mellitus without complications: Secondary | ICD-10-CM | POA: Insufficient documentation

## 2015-06-06 DIAGNOSIS — E0590 Thyrotoxicosis, unspecified without thyrotoxic crisis or storm: Secondary | ICD-10-CM | POA: Diagnosis not present

## 2015-06-11 ENCOUNTER — Ambulatory Visit: Payer: BLUE CROSS/BLUE SHIELD | Admitting: "Endocrinology

## 2015-06-26 ENCOUNTER — Ambulatory Visit (HOSPITAL_COMMUNITY): Payer: BLUE CROSS/BLUE SHIELD | Admitting: Oncology

## 2015-07-10 ENCOUNTER — Ambulatory Visit (HOSPITAL_COMMUNITY): Payer: BLUE CROSS/BLUE SHIELD | Admitting: Oncology

## 2015-07-10 ENCOUNTER — Encounter (HOSPITAL_COMMUNITY): Payer: BLUE CROSS/BLUE SHIELD

## 2015-07-21 ENCOUNTER — Other Ambulatory Visit: Payer: Self-pay | Admitting: Gastroenterology

## 2015-07-30 NOTE — Progress Notes (Signed)
NO SHOW

## 2015-07-31 ENCOUNTER — Encounter (HOSPITAL_COMMUNITY): Payer: BLUE CROSS/BLUE SHIELD

## 2015-07-31 ENCOUNTER — Ambulatory Visit (HOSPITAL_COMMUNITY): Payer: BLUE CROSS/BLUE SHIELD | Admitting: Oncology

## 2015-09-07 ENCOUNTER — Other Ambulatory Visit: Payer: Self-pay | Admitting: Urology

## 2015-09-07 ENCOUNTER — Ambulatory Visit (INDEPENDENT_AMBULATORY_CARE_PROVIDER_SITE_OTHER): Payer: BLUE CROSS/BLUE SHIELD | Admitting: Urology

## 2015-09-07 ENCOUNTER — Ambulatory Visit (HOSPITAL_COMMUNITY)
Admission: RE | Admit: 2015-09-07 | Discharge: 2015-09-07 | Disposition: A | Discharge status: Home / Self Care | Payer: BLUE CROSS/BLUE SHIELD | Source: Ambulatory Visit | Attending: Urology | Admitting: Urology

## 2015-09-07 DIAGNOSIS — N200 Calculus of kidney: Secondary | ICD-10-CM

## 2015-09-07 DIAGNOSIS — R1011 Right upper quadrant pain: Secondary | ICD-10-CM

## 2015-09-07 DIAGNOSIS — I517 Cardiomegaly: Secondary | ICD-10-CM | POA: Diagnosis not present

## 2015-09-07 DIAGNOSIS — M4807 Spinal stenosis, lumbosacral region: Secondary | ICD-10-CM | POA: Diagnosis not present

## 2015-09-07 DIAGNOSIS — N390 Urinary tract infection, site not specified: Secondary | ICD-10-CM

## 2015-10-05 ENCOUNTER — Ambulatory Visit: Payer: BLUE CROSS/BLUE SHIELD | Admitting: Urology

## 2015-11-20 ENCOUNTER — Other Ambulatory Visit: Payer: Self-pay

## 2015-11-20 ENCOUNTER — Encounter: Payer: Self-pay | Admitting: Internal Medicine

## 2015-11-20 MED ORDER — PANTOPRAZOLE SODIUM 40 MG PO TBEC: 40.0000 mg | DELAYED_RELEASE_TABLET | Freq: Two times a day (BID) | ORAL | Status: DC

## 2015-11-20 NOTE — Telephone Encounter (Signed)
APPT MADE AND LETTER SENT  °

## 2015-11-20 NOTE — Telephone Encounter (Signed)
Please tell the patient she hasn't been seen in almost 2 years. I can send in refills for a couple months, but she'll need a follow-up office visit.

## 2015-11-20 NOTE — Telephone Encounter (Signed)
Molly Berry, please schedule ov.  

## 2015-12-25 ENCOUNTER — Encounter: Payer: Self-pay | Admitting: Gastroenterology

## 2015-12-25 ENCOUNTER — Ambulatory Visit: Payer: BLUE CROSS/BLUE SHIELD | Admitting: Gastroenterology

## 2015-12-25 ENCOUNTER — Telehealth: Payer: Self-pay | Admitting: Gastroenterology

## 2015-12-25 NOTE — Telephone Encounter (Signed)
PT WAS A NO SHOW AND LETTER SENT  °

## 2016-02-01 ENCOUNTER — Encounter (HOSPITAL_COMMUNITY): Payer: Self-pay | Admitting: Emergency Medicine

## 2016-02-01 ENCOUNTER — Emergency Department (HOSPITAL_COMMUNITY)
Admission: EM | Admit: 2016-02-01 | Discharge: 2016-02-01 | Disposition: A | Discharge status: Home / Self Care | Payer: BLUE CROSS/BLUE SHIELD | Attending: Emergency Medicine | Admitting: Emergency Medicine

## 2016-02-01 ENCOUNTER — Emergency Department (HOSPITAL_COMMUNITY): Payer: BLUE CROSS/BLUE SHIELD

## 2016-02-01 DIAGNOSIS — Y9289 Other specified places as the place of occurrence of the external cause: Secondary | ICD-10-CM | POA: Insufficient documentation

## 2016-02-01 DIAGNOSIS — E039 Hypothyroidism, unspecified: Secondary | ICD-10-CM | POA: Diagnosis not present

## 2016-02-01 DIAGNOSIS — Z79899 Other long term (current) drug therapy: Secondary | ICD-10-CM | POA: Diagnosis not present

## 2016-02-01 DIAGNOSIS — X501XXA Overexertion from prolonged static or awkward postures, initial encounter: Secondary | ICD-10-CM | POA: Insufficient documentation

## 2016-02-01 DIAGNOSIS — I129 Hypertensive chronic kidney disease with stage 1 through stage 4 chronic kidney disease, or unspecified chronic kidney disease: Secondary | ICD-10-CM | POA: Insufficient documentation

## 2016-02-01 DIAGNOSIS — Y9301 Activity, walking, marching and hiking: Secondary | ICD-10-CM | POA: Insufficient documentation

## 2016-02-01 DIAGNOSIS — S93601A Unspecified sprain of right foot, initial encounter: Secondary | ICD-10-CM | POA: Diagnosis not present

## 2016-02-01 DIAGNOSIS — Y999 Unspecified external cause status: Secondary | ICD-10-CM | POA: Insufficient documentation

## 2016-02-01 DIAGNOSIS — N189 Chronic kidney disease, unspecified: Secondary | ICD-10-CM | POA: Insufficient documentation

## 2016-02-01 DIAGNOSIS — J449 Chronic obstructive pulmonary disease, unspecified: Secondary | ICD-10-CM | POA: Insufficient documentation

## 2016-02-01 DIAGNOSIS — E1122 Type 2 diabetes mellitus with diabetic chronic kidney disease: Secondary | ICD-10-CM | POA: Diagnosis not present

## 2016-02-01 DIAGNOSIS — S99921A Unspecified injury of right foot, initial encounter: Secondary | ICD-10-CM | POA: Diagnosis present

## 2016-02-01 NOTE — Discharge Instructions (Signed)
See Dr. Aline Brochure for recheck in 1 week if pain persist

## 2016-02-01 NOTE — ED Notes (Signed)
Placed post-op shoe on patient's right foot. Patient states "that hurts worse than my tennis shoes. I want one of those boots." Advised PA, verbal order for boot to be placed on patient as requested. Patient tolerated well. Discharged via wheelchair but patient was able to ambulate to vehicle and climb into truck with no assistance or difficulty.

## 2016-02-01 NOTE — ED Triage Notes (Signed)
[place] Pt's right foot twisted, c/o pain to heel.

## 2016-02-02 NOTE — ED Provider Notes (Signed)
[facility] DEPT Provider Note   CSN: [id number] Arrival date & time: 02/01/16  1233     History   Chief Complaint Chief Complaint  Patient presents with  . Foot Pain    HPI Molly Berry is a 50 y.o. female.  The history is provided by the patient. No language interpreter was used.  Foot Pain  This is a new problem. The problem occurs constantly. The problem has been gradually worsening. The symptoms are aggravated by walking. Nothing relieves the symptoms. She has tried nothing for the symptoms.  Pt reports she slipped [place] while [place] the [place] looking for [facility].  Pt complains of pain in the bottom of her foot.  Past Medical History:  Diagnosis Date  . Asthma   . Bell palsy   . Chronic kidney disease   . COPD (chronic obstructive pulmonary disease) ([place])   . Depression   . Diabetes mellitus   . Endometrial cancer ([place])   . Fibromyalgia   . GERD (gastroesophageal reflux disease)   . H/O gastric bypass 05/09/2014   At [facility]  . Hypothyroidism   . Multiple thyroid nodules   . PONV (postoperative nausea and vomiting)   . Pulmonary nodules   . Thyroid disease   . Ulcer   . Uterine cancer ([place]) 12/26/2011   Stage 1, grade 1, S/P hysterectomy initially by robotic technique and then salpingo-oophorectomy at [facility] receiving no postoperative treatment.    Patient Active Problem List   Diagnosis Date Noted  . Morbid obesity due to excess calories ([place]) 03/10/2015  . Diabetes mellitus without complication ([place]) 0000000  . Nontoxic multinodular goiter 03/09/2015  . Essential hypertension, benign 03/09/2015  . 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 Multiple thyroid nodules   . IDA (iron deficiency anemia) 12/27/2013  . Anastomotic ulcer 12/26/2013  . Heme positive stool 12/26/2013  . Ureteral calculus, left 11/17/2013  . Abdominal pain, epigastric 10/11/2013  . Melena 10/11/2013  . SOB (shortness of breath) 11/30/2012  . Multiple pulmonary nodules 11/30/2012     Past Surgical History:  Procedure Laterality Date  . ABDOMINAL HYSTERECTOMY    . CHOLECYSTECTOMY    . COLONOSCOPY WITH PROPOFOL N/A 01/19/2014   [id number]  . DILATION AND CURETTAGE OF UTERUS  12 yrs ago  . DILATION AND CURETTAGE OF UTERUS  06/18/2011   Procedure: DILATATION AND CURETTAGE;  Surgeon: Florian Buff, MD;  Location: [facility] ORS;  Service: Gynecology;  Laterality: N/A;  Suction Dilation and Curettage  . ESOPHAGOGASTRODUODENOSCOPY N/A 10/12/2013   Dr. Rourk:anastomotic ulcer likely cause of bleeding. likely ischemic   . ESOPHAGOGASTRODUODENOSCOPY (EGD) WITH PROPOFOL N/A 01/19/2014   [id number]  . GASTRIC BYPASS  2013   [facility]  . HYSTEROSCOPY W/D&C  06/18/2011   Procedure: DILATATION AND CURETTAGE /HYSTEROSCOPY;  Surgeon: Florian Buff, MD;  Location: [facility];  Service: Gynecology;  Laterality: N/A;  . LITHOTRIPSY    . paniculectomy      OB History    No data available       Home Medications    Prior to Admission medications   Medication Sig Start Date End Date Taking? Authorizing Provider  ALPRAZolam Duanne Moron) 1 MG tablet Take 1 mg by mouth 3 (three) times daily as needed. FOR ANXIETY AND SLEEP   Yes Historical Provider, MD  amphetamine-dextroamphetamine (ADDERALL XR) 30 MG 24 hr capsule Take 30 mg by mouth daily.   Yes Historical Provider, MD  CARAFATE 1 GM/10ML suspension TAKE 2 TEASPOONFULS 4 TIMES A DAY. BEFORE  MEALS AND AT BEDTIME. 07/24/15  Yes Annitta Needs, NP  diphenhydrAMINE (BENADRYL) 25 mg capsule Take 25-75 mg by mouth See admin instructions. Taking 1 Capsule daily as needed for allergic reaction and takes 3 capsules at bedtime   Yes Historical Provider, MD  DULoxetine (CYMBALTA) 60 MG capsule Take 120 mg by mouth every morning.    Yes Historical Provider, MD  ergocalciferol (VITAMIN D2) 50000 UNITS capsule Take 1 capsule (50,000 Units total) by mouth once a week. Patient taking differently: Take 50,000 Units by mouth 2 (two) times a week.  11/01/14  Yes  Manon Hilding Kefalas, PA-C  gabapentin (NEURONTIN) 300 MG capsule Take 300 mg by mouth 4 (four) times daily.   Yes Historical Provider, MD  lamoTRIgine (LAMICTAL) 150 MG tablet Take 150 mg by mouth 2 (two) times daily.   Yes Historical Provider, MD  lisinopril (PRINIVIL,ZESTRIL) 40 MG tablet Take 40 mg by mouth daily as needed. Take when blood pressure "spikes" per pt 03/27/14  Yes Historical Provider, MD  Melatonin 5 MG TABS Take 5 mg by mouth at bedtime.   Yes Historical Provider, MD  Multiple Vitamins-Minerals (MULTIVITAMIN WITH MINERALS) tablet Take 1 tablet by mouth every morning.    Yes Historical Provider, MD  oxyCODONE (ROXICODONE) 15 MG immediate release tablet Take 15 mg by mouth every 4 (four) hours as needed for pain.   Yes Historical Provider, MD  pantoprazole (PROTONIX) 40 MG tablet Take 1 tablet (40 mg total) by mouth 2 (two) times daily. 11/20/15  Yes Carlis Stable, NP  tiZANidine (ZANAFLEX) 4 MG tablet Take 4 mg by mouth at bedtime. For spasms.   Yes Historical Provider, MD  albuterol (PROVENTIL HFA;VENTOLIN HFA) 108 (90 BASE) MCG/ACT inhaler Inhale 2 puffs into the lungs every 6 (six) hours as needed. For asthma     Historical Provider, MD  artificial tears (LACRILUBE) OINT ophthalmic ointment Place into the right eye every 4 (four) hours as needed for dry eyes. Patient not taking: Reported on 02/01/2016 05/06/15   Ripley Fraise, MD  atorvastatin (LIPITOR) 40 MG tablet TAKE 1 TABLET BY MOUTH AT BEDTIME. Patient not taking: Reported on 02/01/2016 02/19/15   Cassandria Anger, MD  furosemide (LASIX) 20 MG tablet Take 20 mg by mouth daily as needed for fluid.  02/15/14   Historical Provider, MD  LORazepam (ATIVAN) 1 MG tablet Take 1 tablet (1 mg total) by mouth 3 (three) times daily as needed for anxiety (or itching). Patient not taking: Reported on 02/01/2016 [id number]   Delora Fuel, MD  ondansetron [facility]) 8 MG tablet Take 8 mg by mouth every 8 (eight) hours as needed for nausea or  vomiting.  11/24/13   Historical Provider, MD  predniSONE (DELTASONE) 50 MG tablet One tablet PO daily for 7 days Patient not taking: Reported on 02/01/2016 05/06/15   Ripley Fraise, MD  SUMAtriptan (IMITREX) 100 MG tablet Take 100 mg by mouth as needed for migraine.  02/18/13   Historical Provider, MD    Family History Family History  Problem Relation Age of Onset  . Pneumonia Mother     Deceased  . Liver cancer Father     Living  . Mental illness Sister   . Anesthesia problems Neg Hx   . Hypotension Neg Hx   . Malignant hyperthermia Neg Hx   . Pseudochol deficiency Neg Hx   . Colon cancer Neg Hx     Social History Social History  Substance Use Topics  . Smoking status: Never  Smoker  . Smokeless tobacco: Never Used  . Alcohol use No     Allergies   Bee venom; Cephalexin; Vancomycin; Cefotaxime; Penicillins; Adhesive [tape]; Latex; and Sulfa antibiotics   Review of Systems Review of Systems  All other systems reviewed and are negative.    Physical Exam Updated Vital Signs BP 157/88 (BP Location: Left Arm)   Pulse 70   Temp 98.4 F (36.9 C) (Oral)   Resp 18   Ht 5\' 4"  (1.626 m)   Wt 117.9 kg   LMP 05/06/2011   SpO2 95%   BMI 44.63 kg/m   Physical Exam  Constitutional: She is oriented to person, place, and time. She appears well-developed and well-nourished. No distress.  HENT:  Head: Normocephalic and atraumatic.  Eyes: Conjunctivae and EOM are normal.  Neck: Normal range of motion. Neck supple.  Cardiovascular: Normal rate and regular rhythm.   No murmur heard. Pulmonary/Chest: Effort normal and breath sounds normal. No respiratory distress.  Abdominal: Soft. She exhibits no distension. There is no tenderness.  Musculoskeletal: Normal range of motion. She exhibits tenderness. She exhibits no edema.  Tender heel of right foot,  Pain with movement and palpation  nv and ns intact  Neurological: She is alert and oriented to person, place, and time.  Skin:  Skin is warm and dry.  Psychiatric: She has a normal mood and affect.  Nursing note and vitals reviewed.    ED Treatments / Results  Labs (all labs ordered are listed, but only abnormal results are displayed) Labs Reviewed - No data to display  EKG  EKG Interpretation None       Radiology Dg Foot Complete Right  Result Date: 02/01/2016 CLINICAL DATA:  RIGHT FOOT PAIN, Pt [place]. Pt's right foot twisted, c/o pain to heel PATIENT STATES " NO PREVIOUS INJURY TO RIGHT FOOT" HISTORY OF ASTHMA, DM, COPD, CANCER EXAM: RIGHT FOOT COMPLETE - 3+ VIEW COMPARISON:  None. FINDINGS: There is no evidence of fracture or dislocation. Calcaneal spurs. There is no evidence of arthropathy or other focal bone abnormality. Soft tissues are unremarkable. IMPRESSION: 1. Calcaneal spurs.  Otherwise negative. Electronically Signed   By: Lucrezia Europe M.D.   On: 02/01/2016 13:32    Procedures Procedures (including critical care time)  Medications Ordered in ED Medications - No data to display   Initial Impression / Assessment and Plan / ED Course  I have reviewed the triage vital signs and the nursing notes.  Pertinent labs & imaging results that were available during my care of the patient were reviewed by me and considered in my medical decision making (see chart for details).  Clinical Course    Pt placed in a cam walker.  She could not tolerate post op shoe and request a walker boot.  Pt is on multiple pain medications.  I will not change pain medications  Final Clinical Impressions(s) / ED Diagnoses   Final diagnoses:  Foot sprain, right, initial encounter    New Prescriptions Discharge Medication List as of 02/01/2016  2:03 PM    An After Visit Summary was printed and given to the patient.   Hollace Kinnier Lolo, PA-C 02/02/16 [place], MD 02/03/16 [phone]

## 2016-02-18 ENCOUNTER — Other Ambulatory Visit: Payer: Self-pay | Admitting: Gastroenterology

## 2016-04-01 ENCOUNTER — Other Ambulatory Visit: Payer: Self-pay | Admitting: Nurse Practitioner

## 2016-06-16 ENCOUNTER — Other Ambulatory Visit: Payer: Self-pay | Admitting: Orthopedic Surgery

## 2016-06-16 DIAGNOSIS — M25531 Pain in right wrist: Secondary | ICD-10-CM

## 2016-07-02 ENCOUNTER — Other Ambulatory Visit: Payer: BLUE CROSS/BLUE SHIELD

## 2016-07-02 ENCOUNTER — Inpatient Hospital Stay: Admission: RE | Admit: 2016-07-02 | Payer: BLUE CROSS/BLUE SHIELD | Source: Ambulatory Visit

## 2016-07-07 ENCOUNTER — Encounter: Payer: Self-pay | Admitting: Family Medicine

## 2016-07-07 ENCOUNTER — Ambulatory Visit (INDEPENDENT_AMBULATORY_CARE_PROVIDER_SITE_OTHER): Payer: BLUE CROSS/BLUE SHIELD | Admitting: Family Medicine

## 2016-07-07 VITALS — BP 138/86 | HR 86 | Temp 97.7°F | Resp 18 | Ht 64.0 in | Wt 248.0 lb

## 2016-07-07 DIAGNOSIS — Z9884 Bariatric surgery status: Secondary | ICD-10-CM | POA: Insufficient documentation

## 2016-07-07 DIAGNOSIS — Z7689 Persons encountering health services in other specified circumstances: Secondary | ICD-10-CM

## 2016-07-07 DIAGNOSIS — F418 Other specified anxiety disorders: Secondary | ICD-10-CM | POA: Diagnosis not present

## 2016-07-07 DIAGNOSIS — F5104 Psychophysiologic insomnia: Secondary | ICD-10-CM | POA: Diagnosis not present

## 2016-07-07 DIAGNOSIS — E785 Hyperlipidemia, unspecified: Secondary | ICD-10-CM | POA: Diagnosis not present

## 2016-07-07 DIAGNOSIS — G894 Chronic pain syndrome: Secondary | ICD-10-CM

## 2016-07-07 DIAGNOSIS — M797 Fibromyalgia: Secondary | ICD-10-CM | POA: Diagnosis not present

## 2016-07-07 DIAGNOSIS — E559 Vitamin D deficiency, unspecified: Secondary | ICD-10-CM

## 2016-07-07 DIAGNOSIS — Z91038 Other insect allergy status: Secondary | ICD-10-CM

## 2016-07-07 DIAGNOSIS — G43909 Migraine, unspecified, not intractable, without status migrainosus: Secondary | ICD-10-CM | POA: Insufficient documentation

## 2016-07-07 DIAGNOSIS — M170 Bilateral primary osteoarthritis of knee: Secondary | ICD-10-CM | POA: Insufficient documentation

## 2016-07-07 DIAGNOSIS — Z91030 Bee allergy status: Secondary | ICD-10-CM | POA: Insufficient documentation

## 2016-07-07 DIAGNOSIS — F988 Other specified behavioral and emotional disorders with onset usually occurring in childhood and adolescence: Secondary | ICD-10-CM | POA: Diagnosis not present

## 2016-07-07 NOTE — Progress Notes (Signed)
Chief Complaint  Patient presents with  . Establish Care   New to establish Has multiple medical problems to evaluate limited old records Sees pain specialty for fibromyalgia and osteoarthritis.  Is on high dose narcotic, 100 mg oxycodone a day.  Also gabapentin 400 TID, and zanaflex at bedtime, sometimes during the day. Sees a psychiatrist for depression.  Is on cymbalta 120 mg a day. Adderall 60 mg a day. Lamictal 150 BID and Xanax 1.5 mg at night AND sometimes xanax during the day. Also takes benadryl at night. Well controlled Dm, Bp. Lipids Is on PPI for GERD Had a gastric bypass, prior weight over 400.          We had a long discussion on whether this practice is a good fit for her.  I feel she is frankly over medicated.  She is sedated in appearence and speech.  She should NOT be on this high dose of oxycodone and xanax.    She should NOT be on amphetamines in the morning to fight drowsiness from her meds. She should try to reduce and discontinue at least a third of her medicines and engage in physical therapy and nutrition and mindfulness training to manage her pain and anxiety.  She says she wants to do this, and that she wants my help.  I will get her record and see her back.  I will communicate with her psychiatry and pain providers and come up with a taper for her.   Patient Active Problem List   Diagnosis Date Noted  . Bee sting allergy 07/07/2016  . Attention deficit disorder (ADD) in adult 07/07/2016  . Chronic insomnia 07/07/2016  . Vitamin D deficiency 07/07/2016  . Migraine syndrome 07/07/2016  . Osteoarthritis of both knees 07/07/2016  . Fibromyalgia syndrome 07/07/2016  . S/P gastric bypass 07/07/2016  . HLD (hyperlipidemia) 07/07/2016  . Morbid obesity due to excess calories ([place]) 03/10/2015  . Diabetes mellitus without complication ([place]) 0000000  . Nontoxic multinodular goiter 03/09/2015  . Essential hypertension, benign 03/09/2015  . H/O gastric bypass  05/09/2014  . IDA (iron deficiency anemia) 12/27/2013  . Ureteral calculus, left 11/17/2013  . SOB (shortness of breath) 11/30/2012    Outpatient Encounter Prescriptions as of 07/07/2016  Medication Sig  . albuterol (PROVENTIL HFA;VENTOLIN HFA) 108 (90 BASE) MCG/ACT inhaler Inhale 2 puffs into the lungs every 6 (six) hours as needed. For asthma   . ALPRAZolam (XANAX) 1 MG tablet Take 1.5 mg by mouth at bedtime as needed. FOR ANXIETY AND SLEEP  . amphetamine-dextroamphetamine (ADDERALL XR) 30 MG 24 hr capsule Take 60 mg by mouth daily.   Marland Kitchen artificial tears (LACRILUBE) OINT ophthalmic ointment Place into the right eye every 4 (four) hours as needed for dry eyes.  . diphenhydrAMINE (BENADRYL) 25 mg capsule Take 25-75 mg by mouth See admin instructions. Taking 1 Capsule daily as needed for allergic reaction and takes 3 capsules at bedtime  . DULoxetine (CYMBALTA) 60 MG capsule Take 120 mg by mouth every morning.   . ergocalciferol (VITAMIN D2) 50000 UNITS capsule Take 1 capsule (50,000 Units total) by mouth once a week. (Patient taking differently: Take 50,000 Units by mouth 2 (two) times a week. )  . furosemide (LASIX) 20 MG tablet Take 20 mg by mouth daily as needed for fluid.   Marland Kitchen gabapentin (NEURONTIN) 400 MG capsule Take 400 mg by mouth 3 (three) times daily. 800 mg in am, 400 mg twice daily.  Marland Kitchen lamoTRIgine (LAMICTAL) 150 MG  tablet Take 150 mg by mouth 2 (two) times daily.  Marland Kitchen lisinopril (PRINIVIL,ZESTRIL) 40 MG tablet Take 40 mg by mouth daily as needed. Take when blood pressure "spikes" per pt  . ondansetron (ZOFRAN) 8 MG tablet Take 8 mg by mouth every 8 (eight) hours as needed for nausea or vomiting.   . Oxycodone HCl 20 MG TABS   . OXYCONTIN 20 MG 12 hr tablet   . pantoprazole (PROTONIX) 40 MG tablet TAKE (1) TABLET TWICE DAILY.  Marland Kitchen pioglitazone (ACTOS) 15 MG tablet Take 15 mg by mouth daily.  . simvastatin (ZOCOR) 20 MG tablet   . SUMAtriptan (IMITREX) 100 MG tablet Take 100 mg by mouth as  needed for migraine.   Marland Kitchen tiZANidine (ZANAFLEX) 4 MG tablet Take 4 mg by mouth at bedtime. For spasms.   No facility-administered encounter medications on file as of 07/07/2016.     Past Medical History:  Diagnosis Date  . Allergy   . Anemia   . Anxiety   . Arthritis    knees, multiple joints  . Asthma   . Bell palsy   . Blood transfusion without reported diagnosis   . Chronic kidney disease   . COPD (chronic obstructive pulmonary disease) ([place])   . Depression   . Diabetes mellitus   . Endometrial cancer ([place])   . Fibromyalgia   . GERD (gastroesophageal reflux disease)   . H/O gastric bypass 05/09/2014   At [facility]  . Hyperlipidemia   . Hypertension   . Hypothyroidism   . Multiple pulmonary nodules 11/30/2012   Followed in Pulmonary clinic/ [facility]/ [place]  - See CT abd  10/29/12  New right lower lobe pulmonary nodularity, primarily ground-  glass in density. This could reflect an inflammatory process,  although follow-up is necessary to exclude atypical neoplasm. Full  chest CT should be considered to evaluate for other pulmonary  findings.     . Multiple thyroid nodules   . PONV (postoperative nausea and vomiting)   . Pulmonary nodules   . Thyroid disease   . Ulcer ([place])   . Uterine cancer ([place]) 12/26/2011   Stage 1, grade 1, S/P hysterectomy initially by robotic technique and then salping[facility]oophorectomy at [facility] receiving no postoperative treatment.    Past Surgical History:  Procedure Laterality Date  . ABDOMINAL HYSTERECTOMY     endometrial cancer  . CHOLECYSTECTOMY    . COLONOSCOPY WITH PROPOFOL N/A 01/19/2014   [id number]  . DILATION AND CURETTAGE OF UTERUS  12 yrs ago  . DILATION AND CURETTAGE OF UTERUS  06/18/2011   Procedure: DILATATION AND CURETTAGE;  Surgeon: Florian Buff, MD;  Location: [facility];  Service: Gynecology;  Laterality: N/A;  Suction Dilation and Curettage  . ESOPHAGOGASTRODUODENOSCOPY N/A 10/12/2013   Dr. Rourk:anastomotic ulcer likely cause  of bleeding. likely ischemic   . ESOPHAGOGASTRODUODENOSCOPY (EGD) WITH PROPOFOL N/A 01/19/2014   [id number]  . GASTRIC BYPASS  2013   [facility]  . HYSTEROSCOPY W/D&C  06/18/2011   Procedure: DILATATION AND CURETTAGE /HYSTEROSCOPY;  Surgeon: Florian Buff, MD;  Location: [facility];  Service: Gynecology;  Laterality: N/A;  . LITHOTRIPSY    . paniculectomy      Social History   Social History  . Marital status: Married    Spouse name: Jeneen Rinks "Rusty"  . Number of children: N/A  . Years of education: 49   Occupational History  . 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 Smoking status: Never Smoker  .  Smokeless tobacco: Never Used  . Alcohol use No  . Drug use: No  . Sexual activity: Yes    Birth control/ protection: None, Surgical   Other Topics Concern  . Not on file   Social History Narrative   Lives with husband and 2 sons 59 and 60. (adopted)    Wants to work but says she is not dependable.  She does do volunteer work.    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  Working on disability.     Education: bachelors degree.   Disabled because of fibromyalgia and depression since 2010       Family History  Problem Relation Age of Onset  . Pneumonia Mother     Deceased  . Arthritis Mother   . Asthma Mother   . Cancer Mother     pancreatic  . COPD Mother   . Depression Mother   . Diabetes Mother   . Kidney disease Mother   . Liver cancer Father     Living  . Arthritis Father   . Cancer Father     liver, prostate  . COPD Father   . Depression Father   . Stroke Father   . Heart disease Maternal Grandmother   . Mental illness Maternal Grandmother   . Alcohol abuse Paternal Grandfather   . Anesthesia problems Neg Hx   . Hypotension Neg Hx   . Malignant hyperthermia Neg Hx   . Pseudochol deficiency Neg Hx   . Colon cancer Neg Hx     Review of Systems  Constitutional: Positive for malaise/fatigue. Negative for weight loss.  HENT:  Negative for hearing loss and sinus pain.   Eyes: Negative for blurred vision and double vision.  Respiratory: Positive for shortness of breath. Negative for cough.   Cardiovascular: Positive for leg swelling. Negative for chest pain.  Gastrointestinal: Negative for constipation, diarrhea and heartburn.  Genitourinary: Negative for dysuria.  Musculoskeletal: Positive for back pain, joint pain, myalgias and neck pain. Negative for falls.  Neurological: Negative for dizziness and headaches.  Psychiatric/Behavioral: Positive for depression. The patient is nervous/anxious and has insomnia.     BP 138/86 (BP Location: Right Arm, Patient Position: Sitting, Cuff Size: Large)   Pulse 86   Temp 97.7 F (36.5 C) (Temporal)   Resp 18   Ht 5\' 4"  (1.626 m)   Wt 248 lb (112.5 kg)   LMP 05/06/2011   SpO2 97%   BMI 42.57 kg/m   Physical Exam  Constitutional: She is oriented to person, place, and time. She appears well-developed and well-nourished.  Obese.  Sedated appearance,  Droopy eyelids  HENT:  Head: Normocephalic and atraumatic.  Mouth/Throat: Oropharynx is clear and moist.  Cardiovascular: Normal rate and regular rhythm.   Pulmonary/Chest: Effort normal and breath sounds normal. No respiratory distress. She has no wheezes.  Musculoskeletal: Normal range of motion. She exhibits edema.  trace  Neurological: She is alert and oriented to person, place, and time.  Psychiatric: She has a normal mood and affect. Her behavior is normal.   ASSESSMENT/PLAN:   1. Encounter to establish care with new doctor   2. Fibromyalgia  - CBC - COMPLETE METABOLIC PANEL WITH GFR - Hemoglobin A1c - Lipid panel - VITAMIN D 25 Hydroxy (Vit-D Deficiency, Fractures) - Microalbumin / creatinine urine ratio - Urinalysis, Routine w reflex microscopic - TSH  3. Chronic pain disorder  4. Depression with anxiety  Greater than 50% of this visit was spent in counseling  and coordinating care.  Total face  to face time:  60 minutes.  Polypharmacy discussed at length    Patient Instructions  Need old records Need release for psych and pain managment  We will be working on reducing your medicines  You need to take an active role in your recovery Walk every day that you are able Start by reducing the xanax to 1 a day Do not take during the day  See me in a couple of weeks   Raylene Everts, MD

## 2016-07-07 NOTE — Patient Instructions (Signed)
Need old records Need release for psych and pain managment  We will be working on reducing your medicines  You need to take an active role in your recovery Walk every day that you are able Start by reducing the xanax to 1 a day Do not take during the day  See me in a couple of weeks

## 2016-07-16 ENCOUNTER — Other Ambulatory Visit: Payer: Self-pay | Admitting: Physician Assistant

## 2016-07-16 DIAGNOSIS — Z1231 Encounter for screening mammogram for malignant neoplasm of breast: Secondary | ICD-10-CM

## 2016-07-16 DIAGNOSIS — Z8542 Personal history of malignant neoplasm of other parts of uterus: Secondary | ICD-10-CM | POA: Insufficient documentation

## 2016-07-21 ENCOUNTER — Other Ambulatory Visit: Payer: BLUE CROSS/BLUE SHIELD

## 2016-07-21 ENCOUNTER — Inpatient Hospital Stay: Admission: RE | Admit: 2016-07-21 | Payer: BLUE CROSS/BLUE SHIELD | Source: Ambulatory Visit

## 2016-07-21 ENCOUNTER — Ambulatory Visit: Payer: BLUE CROSS/BLUE SHIELD | Admitting: Family Medicine

## 2016-08-13 ENCOUNTER — Ambulatory Visit: Payer: BLUE CROSS/BLUE SHIELD

## 2016-09-02 ENCOUNTER — Other Ambulatory Visit: Payer: Self-pay | Admitting: Gastroenterology

## 2016-09-04 ENCOUNTER — Ambulatory Visit
Admission: RE | Admit: 2016-09-04 | Discharge: 2016-09-04 | Disposition: A | Discharge status: Home / Self Care | Payer: BLUE CROSS/BLUE SHIELD | Source: Ambulatory Visit | Attending: Physician Assistant | Admitting: Physician Assistant

## 2016-09-04 ENCOUNTER — Other Ambulatory Visit: Payer: Self-pay | Admitting: Physician Assistant

## 2016-09-04 DIAGNOSIS — N63 Unspecified lump in unspecified breast: Secondary | ICD-10-CM

## 2016-09-04 DIAGNOSIS — Z1231 Encounter for screening mammogram for malignant neoplasm of breast: Secondary | ICD-10-CM

## 2016-09-19 DIAGNOSIS — G894 Chronic pain syndrome: Secondary | ICD-10-CM | POA: Insufficient documentation

## 2016-10-30 ENCOUNTER — Ambulatory Visit (INDEPENDENT_AMBULATORY_CARE_PROVIDER_SITE_OTHER): Payer: BLUE CROSS/BLUE SHIELD | Admitting: Family Medicine

## 2016-10-30 ENCOUNTER — Encounter: Payer: Self-pay | Admitting: Family Medicine

## 2016-10-30 VITALS — BP 122/84 | HR 94 | Temp 98.2°F | Resp 16 | Ht 63.0 in | Wt 249.8 lb

## 2016-10-30 DIAGNOSIS — G894 Chronic pain syndrome: Secondary | ICD-10-CM | POA: Diagnosis not present

## 2016-10-30 DIAGNOSIS — R300 Dysuria: Secondary | ICD-10-CM | POA: Diagnosis not present

## 2016-10-30 DIAGNOSIS — Z79899 Other long term (current) drug therapy: Secondary | ICD-10-CM

## 2016-10-30 LAB — POCT URINALYSIS DIP (MANUAL ENTRY)
BILIRUBIN UA: NEGATIVE
BILIRUBIN UA: NEGATIVE mg/dL
Blood, UA: NEGATIVE
Nitrite, UA: POSITIVE — AB
Protein Ur, POC: NEGATIVE mg/dL
SPEC GRAV UA: 1.01 (ref 1.010–1.025)
Urobilinogen, UA: 0.2 E.U./dL
pH, UA: 5.5 (ref 5.0–8.0)

## 2016-10-30 NOTE — Patient Instructions (Addendum)
IF you received an x-ray today, you will receive an invoice from [facility]. Please contact [facility] at [phone] with questions or concerns regarding your invoice.   IF you received labwork today, you will receive an invoice from [facility]. Please contact [facility] at [phone] with questions or concerns regarding your invoice.   Our billing staff will not be able to assist you with questions regarding bills from these companies.  You will be contacted with the lab results as soon as they are available. The fastest way to get your results is to activate your My Chart account. Instructions are located on the last page of this paperwork. If you have not heard from [place] regarding the results in 2 weeks, please contact this office.     Opioid Withdrawal Opioids are powerful substances that relieve pain. Opioids include illegal drugs, such as heroin, as well as prescription pain medicines, such as codeine, morphine, hydrocodone, oxycodone, and fentanyl. Opioid withdrawal is a group of symptoms that can happen if you have been taking opioids for a long time and suddenly stop. What are the causes? This condition is caused by taking opioids for weeks and then doing any of the following:  Stopping use.  Rapidly reducing use.  Taking a medicine to block their effect.  What increases the risk? This condition is more likely to develop in:  People who take opioids incorrectly.  People who take opioids for a long period of time.  What are the signs or symptoms? Symptoms of this condition can be physical or mental. Physical symptoms include:  Nausea and vomiting.  Muscle aches or spasms.  Watery eyes and runny nose.  Widening of the dark centers of the eyes (dilated pupils).  Hair standing on end.  Fever and sweating.  Intestinal cramping and diarrhea.  Increased blood pressure and fast pulse.  Mental symptoms  include:  Depression.  Anxiety.  Restlessness and irritability.  Trouble sleeping.  When symptoms start and how long they last depends on if you have been taking an opioid that works fast and then loses its effect quickly (short acting-opioid), an opioid that works for a longer period of time (long-acting opioid), or a drug that blocks the effects of opioids.  If you have been taking a short-acting opioid, such as heroin and oxycodone, symptoms occur within hours of stopping or reducing the amount you take. The worst symptoms (peak withdrawal) occur in 24-48 hours. Symptoms should subside in 3-5 days.  If you have been taking a long-acting opioid, such as methadone, symptoms can occur within 30 hours of stopping or reducing the amount you take and can continue for up to 10 days.  If you are taking a drug that blocks the effects of opioids, such as naltrexone or naloxone, symptoms begin within minutes.  How is this diagnosed? This condition is diagnosed based on:  Your symptoms.  Your medical history.  Your history of drug and alcohol use.  Which medicines you have been taking.  Your health care provider may:  Perform a physical exam.  Order tests.  Ask that you see a mental health professional.  How is this treated? Treatment for this condition is usually provided by mental health professionals with training in substance use disorders (addiction specialists). Treatment may involve:  Counseling. This treatment is also called talk therapy. It is provided by substance use treatment counselors.  Support groups. Support groups are run by people who have quit using opioids. They provide emotional support,  advice, and guidance.  Medicine. Some medicines can help to lessen certain withdrawal symptoms. Sometimes an opioid is prescribed to replace the opioid that you have been taking. You may be asked to take less and less of this opioid over time to lessen or prevent withdrawal  symptoms.  Follow these instructions at home:  Take over-the-counter and prescription medicines only as told by your health care provider.  Check with your health care provider before starting any new medicines.  Keep all follow-up visits as told by your health care provider. This is important. Contact a health care provider if:  You are not able to take your medicines as told.  Your symptoms get worse.  You take an opioid after stopping use, or you take more of an opioid than you have been. Get help right away if:  You have a seizure.  You lose consciousness.  You have serious thoughts about hurting yourself or others. If you ever feel like you may hurt yourself or others, or have thoughts about taking your own life, get help right away. You can go to your nearest emergency department or call:  Your local emergency services (911 in the [place]  A suicide crisis helpline, such as the [place] at 604-593-5735. This is open 24 hours a day.  This information is not intended to replace advice given to you by your health care provider. Make sure you discuss any questions you have with your health care provider. Document Released: 04/24/2003 Document Revised: 02/01/2016 Document Reviewed: 02/01/2016 Elsevier Interactive Patient Education  [employer].

## 2016-10-30 NOTE — Progress Notes (Signed)
Subjective:    Patient ID: Molly Berry, female    DOB: Dec 21, 1965, 51 y.o.   MRN: [id number] Chief Complaint  Patient presents with  . New pt consult    HPI  Molly Berry is a 51 year old woman here to establish Berry. We can see from Berry everywhere that this is the third office she has established Berry with since March 5. She has a history of chronic pain syndrome and was referred to pain management but doesn't look like this was successful. She was seen her current PCPs 2 weeks ago having these more pain medication than prescribed.  She is being seen at [facility] sent her here who she has been seeing between 6-7 mos (started January). She is in-between pain clinics.  Her current physician wouldn't bridge her. Her husband is being seen there are as well so they are in couples so helping her trouble shoot how to not let her husband have her pain medications. He is planning to go to drug rehab after his knee replacement.   Her husband is an addict - married 51 years, he has had severe problems for the past 1.5 yrs with mult joint replacements. One of his friends reported anonymously that she was selling her medication to her pain clinic. She was being seen at Molly Berry office - seeing Molly Berry - she really liked it and felt like she had a good relationship with him.  sh  LIkes Dr. Loyal Berry at [facility] - feels like she has been her quarter-back to unite her specialists.   H/o cancer, lots of specialists.  Had a physician from when she was 51 yo that just retired 5 yrs ago so transferred to who took over their practice but she left and practice shut down.  Looked hard for new physician than started seeing Molly Berry 3 mo ago and has been working with her specialists. Molly Berry wrote her rxs initially and was working on getting a referral to a new pain clinic. 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 Starleen Berry office to see if she could be bridged but hasn't gotten a call back.   Molly Berry wrote her Adderall 20mg  tid and 10mg  bid as well as alprazolam 1 mg bid  She has been on oxycontin 20mg  XR bid which lasted until yesterday.   Chronic pain is from Fibromyalgia and highly suspected that she has lupus.  Goes to [facility] and there is a program for the partners of addicts - goes wkly to [employer] [facility].   Opioid Risk Tool: Moderate risk 6  Past Medical History:  Diagnosis Date  . Allergy   . Anemia   . Anxiety   . Arthritis    knees, multiple joints  . Asthma   . Bell palsy   . Blood transfusion without reported diagnosis   . Chronic kidney disease   . COPD (chronic obstructive pulmonary disease) ([place])   . Depression   . Diabetes mellitus   . Endometrial cancer ([place])   . Fibromyalgia   . GERD (gastroesophageal reflux disease)   . H/O gastric bypass 05/09/2014   At [facility]  . Hyperlipidemia   . Hypertension   . Hypothyroidism   . Multiple pulmonary nodules 11/30/2012   Followed in Pulmonary clinic/ [facility]/ [place]  - See CT abd  10/29/12  New right lower lobe pulmonary nodularity, primarily ground-  glass in density. This could reflect an inflammatory process,  although follow-up is necessary to exclude atypical neoplasm. Full  chest CT should be considered to evaluate for other pulmonary  findings.     . Multiple thyroid nodules   . PONV (postoperative nausea and vomiting)   . Pulmonary nodules   . Thyroid disease   . Ulcer   . Uterine cancer ([place]) 12/26/2011   Stage 1, grade 1, S/P hysterectomy initially by robotic technique and then salpingo[facility]oophorectomy at [facility] receiving no postoperative treatment.   Past Surgical History:  Procedure Laterality Date  . ABDOMINAL HYSTERECTOMY     endometrial cancer  . CHOLECYSTECTOMY    . COLONOSCOPY WITH PROPOFOL N/A 01/19/2014   [id number]:[id number]  . DILATION AND CURETTAGE OF  UTERUS  12 yrs ago  . DILATION AND CURETTAGE OF UTERUS  06/18/2011   Procedure: DILATATION AND CURETTAGE;  Surgeon: Florian Buff, MD;  Location: [facility];  Service: Gynecology;  Laterality: N/A;  Suction Dilation and Curettage  . ESOPHAGOGASTRODUODENOSCOPY N/A 10/12/2013   Dr. Rourk:anastomotic ulcer likely cause of bleeding. likely ischemic   . ESOPHAGOGASTRODUODENOSCOPY (EGD) WITH PROPOFOL N/A 01/19/2014   ION:GEXBMW  . GASTRIC BYPASS  2013   [facility]  . HYSTEROSCOPY W/D&C  06/18/2011   Procedure: DILATATION AND CURETTAGE /HYSTEROSCOPY;  Surgeon: Florian Buff, MD;  Location: [facility];  Service: Gynecology;  Laterality: N/A;  . LITHOTRIPSY    . paniculectomy     Current Outpatient Prescriptions on File Prior to Visit  Medication Sig Dispense Refill  . ALPRAZolam (XANAX) 1 MG tablet Take 1.5 mg by mouth at bedtime as needed. FOR ANXIETY AND SLEEP    . amphetamine-dextroamphetamine (ADDERALL XR) 30 MG 24 hr capsule Take 60 mg by mouth daily.     . diphenhydrAMINE (BENADRYL) 25 mg capsule Take 25-75 mg by mouth See admin instructions. Taking 1 Capsule daily as needed for allergic reaction and takes 3 capsules at bedtime    . ergocalciferol (VITAMIN D2) 50000 UNITS capsule Take 1 capsule (50,000 Units total) by mouth once a week. (Patient taking differently: Take 50,000 Units by mouth 2 (two) times a week. ) 12 capsule 0  . furosemide (LASIX) 20 MG tablet Take 20 mg by mouth daily as needed for fluid.     Marland Kitchen gabapentin (NEURONTIN) 400 MG capsule Take 400 mg by mouth 3 (three) times daily. 800 mg in am, 400 mg twice daily.    Marland Kitchen lamoTRIgine (LAMICTAL) 150 MG tablet Take 150 mg by mouth 2 (two) times daily.    Marland Kitchen lisinopril (PRINIVIL,ZESTRIL) 40 MG tablet Take 40 mg by mouth daily as needed. Take when blood pressure "spikes" per pt    . ondansetron (ZOFRAN) 8 MG tablet Take 8 mg by mouth every 8 (eight) hours as needed for nausea or vomiting.     . Oxycodone HCl 20 MG TABS   0  . OXYCONTIN 20 MG 12 hr  tablet   0  . pantoprazole (PROTONIX) 40 MG tablet TAKE (1) TABLET TWICE DAILY. 60 tablet 3  . simvastatin (ZOCOR) 20 MG tablet   1  . SUMAtriptan (IMITREX) 100 MG tablet Take 100 mg by mouth as needed for migraine.     Marland Kitchen albuterol (PROVENTIL HFA;VENTOLIN HFA) 108 (90 BASE) MCG/ACT inhaler Inhale 2 puffs into the lungs every 6 (six) hours as needed. For asthma     . artificial tears (LACRILUBE) OINT ophthalmic ointment Place into  the right eye every 4 (four) hours as needed for dry eyes. (Patient not taking: Reported on 10/30/2016) 3.5 g 0  . DULoxetine (CYMBALTA) 60 MG capsule Take 120 mg by mouth every morning.     . pioglitazone (ACTOS) 15 MG tablet Take 15 mg by mouth daily.    Marland Kitchen tiZANidine (ZANAFLEX) 4 MG tablet Take 4 mg by mouth at bedtime. For spasms.     No current facility-administered medications on file prior to visit.    Allergies  Allergen Reactions  . Cephalexin Anaphylaxis  . Vancomycin Anaphylaxis and Hives  . Adhesive [Tape] Rash  . Cefotaxime Rash  . Ciprofloxacin Rash    hives  . Clindamycin Rash  . Latex Rash  . Penicillins Hives and Rash  . Sulfa Antibiotics Rash   Family History  Problem Relation Age of Onset  . Pneumonia Mother        Deceased  . Arthritis Mother   . Asthma Mother   . Cancer Mother        pancreatic  . COPD Mother   . Depression Mother   . Diabetes Mother   . Kidney disease Mother   . Liver cancer Father        Living  . Arthritis Father   . Cancer Father        liver, prostate  . COPD Father   . Depression Father   . Stroke Father   . Heart disease Maternal Grandmother   . Mental illness Maternal Grandmother   . Alcohol abuse Paternal Grandfather   . Anesthesia problems Neg Hx   . Hypotension Neg Hx   . Malignant hyperthermia Neg Hx   . Pseudochol deficiency Neg Hx   . Colon cancer Neg Hx    Social History   Social History  . Marital status: Married    Spouse name: Jeneen Rinks "Rusty"  . Number of children: N/A  . Years of  education: 10   Occupational History  . 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 Smoking status: Never Smoker  . Smokeless tobacco: Never Used  . Alcohol use No  . Drug use: No  . Sexual activity: Yes    Birth control/ protection: None, Surgical   Other Topics Concern  . None   Social History Narrative   Lives with husband and 2 sons 30 and 87. (adopted)    Wants to work but says she is not dependable.  She does do volunteer work.    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  Working on disability.     Education: bachelors degree.   Disabled because of fibromyalgia and depression since 2010      Depression screen [facility] 2/9 10/30/2016 07/07/2016 03/09/2015 05/25/2014 03/02/2014  Decreased Interest 0 0 0 0 2  Down, Depressed, Hopeless 0 0 0 0 2  PHQ - 2 Score 0 0 0 0 4  Altered sleeping - - - - -  Tired, decreased energy - - - - -  Change in appetite - - - - -  Feeling bad or failure about yourself  - - - - -  Trouble concentrating - - - - -  Moving slowly or fidgety/restless - - - - -  Suicidal thoughts - - - - -  PHQ-9 Score - - - - -    Review of Systems See hpi    Objective:   Physical Exam  Constitutional: She is oriented to person, place, and  time. She appears well-developed and well-nourished. No distress.  HENT:  Head: Normocephalic and atraumatic.  Right Ear: External ear normal.  Left Ear: External ear normal.  Eyes: Conjunctivae are normal. No scleral icterus.  Neck: Normal range of motion. Neck supple. No thyromegaly present.  Cardiovascular: Normal rate, regular rhythm, normal heart sounds and intact distal pulses.   Pulmonary/Chest: Effort normal and breath sounds normal. No respiratory distress.  Musculoskeletal: She exhibits no edema.  Lymphadenopathy:    She has no cervical adenopathy.  Neurological: She is alert and oriented to person, place, and time.  Skin: Skin is warm and dry. She is not diaphoretic. No erythema.    Psychiatric: She has a normal mood and affect. Her behavior is normal.    BP 122/84   Pulse 94   Temp 98.2 F (36.8 C) (Oral)   Resp 16   Ht 5\' 3"  (1.6 m)   Wt 249 lb 12.8 oz (113.3 kg)   LMP 05/06/2011   SpO2 96%   BMI 44.25 kg/m      Assessment & Plan:  Today I have utilized the [place] Controlled Substance Registry's online query to confirm compliance regarding the patient's narcotic pain medications. My review reveals appropriate prescription fills and that [facility] is the sole provider of these medications. Rechecks will occur regularly and the patient is aware of our use of the system.  1. Chronic pain syndrome   2. High risk medication use   3. Dysuria     Orders Placed This Encounter  Procedures  . Urine Culture  . ToxASSURE Select 13 (MW), Urine  . Berry order/instruction:    Scheduling Instructions:     Complete orders, AVS and go.  Marland Kitchen POCT urinalysis dipstick    Meds ordered this encounter  Medications  . vortioxetine HBr (TRINTELLIX) 20 MG TABS    Sig: Take by mouth.  . canagliflozin (INVOKANA) 100 MG TABS tablet    Sig: Take by mouth daily before breakfast.    Delman Cheadle, M.D.  Primary Berry at [facility] [address] [place], [place] [address] [phone] phone [phone] fax  11/01/16 11:58 PM

## 2016-11-01 ENCOUNTER — Telehealth: Payer: Self-pay | Admitting: Family Medicine

## 2016-11-01 NOTE — Telephone Encounter (Addendum)
UDS not back. Waiting to talk to her prior pain management clinic to confirm her prior reports.

## 2016-11-01 NOTE — Telephone Encounter (Signed)
Pt called in asking about her oxycodone refill & was saying that Dr. Brigitte Pulse is waiting on a drug screen, she states that she is completely out.   Please Advise  [phone]

## 2016-11-03 ENCOUNTER — Telehealth: Payer: Self-pay | Admitting: Family Medicine

## 2016-11-03 ENCOUNTER — Encounter: Payer: Self-pay | Admitting: Family Medicine

## 2016-11-03 NOTE — Telephone Encounter (Signed)
Ms. Severino -  Your urine drug screen results are not yet back and I have not heard back from your pain management office. Dr. Toy Care is on vacation this week. I did speak with Ms. Loyal Buba and Raynelle Jan. If your UDS is appropriate, I will be willing to taper you off of pain medication over the next several months at most but I am not going to be able to provide long term pain management as a primary care physician without specialized training in this field.  Dr. Brigitte Pulse

## 2016-11-03 NOTE — Telephone Encounter (Signed)
mychart note sent

## 2016-11-03 NOTE — Telephone Encounter (Signed)
Pt calling about urine test

## 2016-11-03 NOTE — Telephone Encounter (Signed)
Pt is wanting to let Dr. Brigitte Pulse know that she does not have access to [facility] so if someone can call and let her know when the meds will be called in she would appreciate that   Best number [phone]

## 2016-11-04 ENCOUNTER — Telehealth: Payer: Self-pay | Admitting: Family Medicine

## 2016-11-04 LAB — TOXASSURE SELECT 13 (MW), URINE

## 2016-11-04 NOTE — Telephone Encounter (Signed)
Pt is checking on the results of her urine drug screen and if she has a uti and what meds are dr Brigitte Pulse going to call in for her she states she was her last Thursday   Best number [phone]

## 2016-11-04 NOTE — Telephone Encounter (Signed)
Patient wants to know what is she suppose to do at this point without any pain medication. She states these last few days have been very hard for her.

## 2016-11-04 NOTE — Telephone Encounter (Signed)
See other phone note  I don't know. I'm sorry she has found herself in this situation but not even pain management writes prescriptions on the first visit and I still don't have the drug screen results back. I will call her prior pain mngmnt office again. Please touch base with the lab and see what the hold up on the UDS is - was done [facility] 6/28 so 5d now.    As I noted in the last telephone note, at this point I think the best way I can help is taper her off her medication which would likely take about 4-6 wks.

## 2016-11-04 NOTE — Telephone Encounter (Signed)
As requested below, please check with the lab to see when UDS results will be expected to be avail approx

## 2016-11-04 NOTE — Telephone Encounter (Signed)
Pt called back in saying she does not have mychart access (see next phone note) so I disabled her account so we would not send further things there.

## 2016-11-04 NOTE — Telephone Encounter (Signed)
I advised the patient regarding your message and she states she understood and will be awaiting your response regarding her results on the DS

## 2016-11-06 NOTE — Telephone Encounter (Signed)
Pt following up on prescription. She said she was waiting on results of drug screen and was told they had come back. Please advise. Pt callback is [phone].

## 2016-11-06 NOTE — Telephone Encounter (Signed)
Results for UDS posted on 10/30/16. Please advise.

## 2016-11-07 MED ORDER — OXYCODONE HCL 10 MG PO TABS: ORAL_TABLET | ORAL | 0 refills | Status: DC

## 2016-11-07 MED ORDER — OXYCODONE HCL 20 MG PO TABS: ORAL_TABLET | ORAL | 0 refills | Status: DC

## 2016-11-07 MED ORDER — NITROFURANTOIN MONOHYD MACRO 100 MG PO CAPS: 100.0000 mg | ORAL_CAPSULE | Freq: Two times a day (BID) | ORAL | 0 refills | Status: DC

## 2016-11-07 NOTE — Telephone Encounter (Signed)
Looks like pt did have a bladder infection - an antibiotic has been sent to her pharmacy.   UDS appropriate for reported medication regimen. Wrote taper off of oxycodone for pt to pick up at her convenience.    She was on oxycontin 20mg  bid with oxycodone 20mg  qid - both last filled 5/18 per [facility].   Total of 120mg  of oxycodone a day. She had already been out of medication for the past week.   Will resume without oxycontin at oxycodone 20mg  qid x 2 wks, then 20mg  tid x 2 wks. THen oxycodone 10mg  rx to be filled in 28d from today will be for oxyodone 10mg  qid x 1 wk, tid x 1 wk, bid x 1 wk, qd x 1 wk, then stop.

## 2016-11-07 NOTE — Telephone Encounter (Signed)
Pt given instructions and advised antibiotic is at the pharmacy. Pt is unhappy with tapered dosage of Oxycodone. I informed her Dr. Brigitte Pulse wants to taper her off high doses of narcotics.

## 2016-11-11 NOTE — Telephone Encounter (Signed)
Derrick with [facility] is retuning Dr. Brigitte Pulse phone call.  Contact number [phone]

## 2016-11-11 NOTE — Telephone Encounter (Signed)
Please advise 

## 2016-11-12 NOTE — Telephone Encounter (Signed)
Pt can taper off using the given rxs, of course she is not happy with this - she does not want to taper off, she wants to be maintained on her current regimen but I will not be prescribing pt any additional controlled mediations beyond this taper.  I am not willing to provide patient pain management. To many red flags, no clear indication, and I do not have a long, established history with patient. We are primary care and despite prior misinformation, I do not have any specialization or training in pain management so am no more equipped to prescribe her desired medications than her current PCP who has declined as well. She can take the taper off and use the interim 2 mos to try to establish with a pain management provider (her PCP or therapist or psychologist can help her with the referral - not [place]) or not but I am unwilling to prescribe any additional controlled medications. She is more than welcome to come in for an appointment to discuss with me directly if she would like but after talking with all providers - PCP, psych, therapist, and pt having requested that prior pain management not discuss her case, this is not something which I have the expertise to safely take on.  Returned call to Montine Circle, pt's prior pain management provider, regarding her reason for need for pain management and reason discharge from [facility]. Unfortunately, pt made it clear to that provider that she does not want them communicating about her care without a specifically signed release so they were unable to give me any information.

## 2016-11-13 ENCOUNTER — Ambulatory Visit
Admission: RE | Admit: 2016-11-13 | Discharge: 2016-11-13 | Disposition: A | Discharge status: Home / Self Care | Payer: BLUE CROSS/BLUE SHIELD | Source: Ambulatory Visit | Attending: Physician Assistant | Admitting: Physician Assistant

## 2016-11-13 DIAGNOSIS — N63 Unspecified lump in unspecified breast: Secondary | ICD-10-CM

## 2016-11-13 HISTORY — DX: Unspecified lump in unspecified breast: N63.0

## 2016-11-13 NOTE — Telephone Encounter (Signed)
Addressed with pt below.

## 2016-12-05 NOTE — Progress Notes (Signed)
Faxed 12/05/16

## 2016-12-10 DIAGNOSIS — F331 Major depressive disorder, recurrent, moderate: Secondary | ICD-10-CM | POA: Insufficient documentation

## 2016-12-23 ENCOUNTER — Ambulatory Visit (INDEPENDENT_AMBULATORY_CARE_PROVIDER_SITE_OTHER): Payer: BLUE CROSS/BLUE SHIELD | Admitting: Urology

## 2016-12-23 DIAGNOSIS — N201 Calculus of ureter: Secondary | ICD-10-CM | POA: Diagnosis not present

## 2016-12-23 DIAGNOSIS — N3000 Acute cystitis without hematuria: Secondary | ICD-10-CM

## 2016-12-28 ENCOUNTER — Emergency Department (HOSPITAL_COMMUNITY)
Admission: EM | Admit: 2016-12-28 | Discharge: 2016-12-28 | Disposition: A | Discharge status: Home / Self Care | Payer: BLUE CROSS/BLUE SHIELD | Attending: Emergency Medicine | Admitting: Emergency Medicine

## 2016-12-28 ENCOUNTER — Emergency Department (HOSPITAL_COMMUNITY): Payer: BLUE CROSS/BLUE SHIELD

## 2016-12-28 ENCOUNTER — Encounter (HOSPITAL_COMMUNITY): Payer: Self-pay | Admitting: Emergency Medicine

## 2016-12-28 DIAGNOSIS — Z91040 Latex allergy status: Secondary | ICD-10-CM | POA: Diagnosis not present

## 2016-12-28 DIAGNOSIS — E039 Hypothyroidism, unspecified: Secondary | ICD-10-CM | POA: Insufficient documentation

## 2016-12-28 DIAGNOSIS — E119 Type 2 diabetes mellitus without complications: Secondary | ICD-10-CM | POA: Insufficient documentation

## 2016-12-28 DIAGNOSIS — E876 Hypokalemia: Secondary | ICD-10-CM | POA: Diagnosis not present

## 2016-12-28 DIAGNOSIS — I10 Essential (primary) hypertension: Secondary | ICD-10-CM | POA: Diagnosis not present

## 2016-12-28 DIAGNOSIS — J45909 Unspecified asthma, uncomplicated: Secondary | ICD-10-CM | POA: Diagnosis not present

## 2016-12-28 DIAGNOSIS — J449 Chronic obstructive pulmonary disease, unspecified: Secondary | ICD-10-CM | POA: Insufficient documentation

## 2016-12-28 DIAGNOSIS — T07XXXA Unspecified multiple injuries, initial encounter: Secondary | ICD-10-CM

## 2016-12-28 DIAGNOSIS — R1011 Right upper quadrant pain: Secondary | ICD-10-CM | POA: Diagnosis not present

## 2016-12-28 DIAGNOSIS — Z79899 Other long term (current) drug therapy: Secondary | ICD-10-CM | POA: Diagnosis not present

## 2016-12-28 DIAGNOSIS — R51 Headache: Secondary | ICD-10-CM | POA: Diagnosis present

## 2016-12-28 LAB — BASIC METABOLIC PANEL
Anion gap: 8 (ref 5–15)
BUN: 15 mg/dL (ref 6–20)
CO2: 27 mmol/L (ref 22–32)
Calcium: 9.1 mg/dL (ref 8.9–10.3)
Chloride: 105 mmol/L (ref 101–111)
Creatinine, Ser: 0.88 mg/dL (ref 0.44–1.00)
GFR calc Af Amer: >60 mL/min (ref >60)
Glucose, Bld: 105 mg/dL — ABNORMAL HIGH (ref 65–99)
Potassium: 3.1 mmol/L — ABNORMAL LOW (ref 3.5–5.1)
SODIUM: 140 mmol/L (ref 135–145)

## 2016-12-28 MED ORDER — HYDROCODONE-ACETAMINOPHEN 5-325 MG PO TABS: ORAL_TABLET | ORAL | 0 refills | Status: DC

## 2016-12-28 MED ORDER — METHOCARBAMOL 500 MG PO TABS: 1000.0000 mg | ORAL_TABLET | Freq: Four times a day (QID) | ORAL | 0 refills | Status: DC | PRN

## 2016-12-28 MED ORDER — POTASSIUM CHLORIDE CRYS ER 20 MEQ PO TBCR
40.0000 meq | EXTENDED_RELEASE_TABLET | Freq: Once | ORAL | Status: AC
  Administered 2016-12-28: 40 meq via ORAL
  Filled 2016-12-28: qty 2

## 2016-12-28 MED ORDER — IOPAMIDOL (ISOVUE-300) INJECTION 61%
100.0000 mL | Freq: Once | INTRAVENOUS | Status: AC | PRN
  Administered 2016-12-28: 100 mL via INTRAVENOUS

## 2016-12-28 MED ORDER — HYDROMORPHONE HCL 1 MG/ML IJ SOLN
1.0000 mg | Freq: Once | INTRAMUSCULAR | Status: AC
  Administered 2016-12-28: 1 mg via INTRAVENOUS
  Filled 2016-12-28: qty 1

## 2016-12-28 NOTE — Discharge Instructions (Signed)
Take the prescriptions as directed.  Apply moist heat or ice to the area(s) of discomfort, for 15 minutes at a time, several times per day for the next few days.  Do not fall asleep on a heating or ice pack.  Call your regular medical doctor on Monday to schedule a follow up appointment this week.  Return to the Emergency Department immediately if worsening. ° °

## 2016-12-28 NOTE — ED Notes (Signed)
Radiology to room

## 2016-12-28 NOTE — ED Notes (Signed)
Pt drinking water per fluid challenge 

## 2016-12-28 NOTE — ED Notes (Signed)
Call to CT 

## 2016-12-28 NOTE — ED Notes (Signed)
Pt and spouse report that they have adopted 3 boys and their youngest died three weeks ago

## 2016-12-28 NOTE — ED Notes (Signed)
Pt ambulated around hall. Pt stated that she feels better, is only sore.

## 2016-12-28 NOTE — ED Provider Notes (Signed)
Pt received at sign out with CT pending. Workup reassuring. Potassium repleted PO. Pt has tol PO well without N/V. Pt has ambulated with steady gait, easy resps, NAD. VS remain stable. Pt states she is ready to go home now. Tx symptomatically, f/u PMD. Dx and testing d/w pt and family.  Questions answered.  Verb understanding, agreeable to d/c home with outpt f/u.    BP (!) 145/80 (BP Location: Right Arm)   Pulse 62   Temp 97.9 F (36.6 C) (Oral)   Resp 17   Ht 5\' 4"  (1.626 m)   Wt 109.8 kg (242 lb)   LMP 05/06/2011   SpO2 100%   BMI 41.54 kg/m    Results for orders placed or performed during the hospital encounter of 02/72/53  Basic metabolic panel  Result Value Ref Range   Sodium 140 135 - 145 mmol/L   Potassium 3.1 (L) 3.5 - 5.1 mmol/L   Chloride 105 101 - 111 mmol/L   CO2 27 22 - 32 mmol/L   Glucose, Bld 105 (H) 65 - 99 mg/dL   BUN 15 6 - 20 mg/dL   Creatinine, Ser 0.88 0.44 - 1.00 mg/dL   Calcium 9.1 8.9 - 10.3 mg/dL   GFR calc non Af Amer >60 >60 mL/min   GFR calc Af Amer >60 >60 mL/min   Anion gap 8 5 - 15   Ct Abdomen Pelvis W Contrast Result Date: 12/28/2016 CLINICAL DATA:  Pain after fall from horse EXAM: CT ABDOMEN AND PELVIS WITH CONTRAST TECHNIQUE: Multidetector CT imaging of the abdomen and pelvis was performed using the standard protocol following bolus administration of intravenous contrast. CONTRAST:  129mL ISOVUE-300 IOPAMIDOL (ISOVUE-300) INJECTION 61% COMPARISON:  09/07/2015 FINDINGS: Lower chest: Lung bases demonstrate no acute consolidation, pneumothorax or pleural effusion. Mild coronary artery calcification. Normal heart size. Hepatobiliary: Status post cholecystectomy. Slightly enlarged extrahepatic common bile duct up to 14 mm, likely due to postsurgical change. No focal hepatic abnormality. Pancreas: Unremarkable. No pancreatic ductal dilatation or surrounding inflammatory changes. Spleen: Normal in size without focal abnormality. Adrenals/Urinary Tract:  Adrenal glands are within normal limits. Bilateral intrarenal calculi, measuring up to 5 mm mid pole on the right and 6 mm in the lower pole on the left. Stone in the left renal pelvis measuring 8 mm on coronal views. No hydronephrosis. No ureteral stones. Tiny focus of air within the bladder. Stomach/Bowel: Postsurgical changes of the stomach and small bowel. No evidence for a bowel obstruction. Normal appendix. No colon wall thickening. Vascular/Lymphatic: Aortic atherosclerosis. No enlarged abdominal or pelvic lymph nodes. Reproductive: Status post hysterectomy. No adnexal masses. Other: Negative for free air or free fluid Musculoskeletal: No acute or suspicious bone lesion. Degenerative changes. IMPRESSION: 1. Negative for acute solid organ injury, free air or free fluid 2. Multiple bilateral intrarenal calculi including a 9 mm stone in the left renal pelvis. No hydronephrosis or ureteral stones 3. Status post cholecystectomy. Slightly enlarged extrahepatic bile duct likely due to postsurgical change Electronically Signed   By: Donavan Foil M.D.   On: 12/28/2016 22:13   Dg Chest Port 1 View Result Date: 12/28/2016 CLINICAL DATA:  [place] off horse, right sided ribcage pain EXAM: PORTABLE CHEST 1 VIEW COMPARISON:  11/07/2013 FINDINGS: Borderline to mild cardiomegaly. No consolidation or pleural effusion. No pneumothorax. IMPRESSION: No active disease.  Borderline cardiomegaly Electronically Signed   By: Donavan Foil M.D.   On: 12/28/2016 21:12      Francine Graven, DO 12/28/16 2315

## 2016-12-28 NOTE — ED Provider Notes (Signed)
[facility] DEPT Provider Note   CSN: [id number] Arrival date & time: 12/28/16  1946     History   Chief Complaint Chief Complaint  Patient presents with  . Fall    HPI Molly Berry is a 51 y.o. female.  HPI Patient reports that she fell off a horse 1 PM today landing on her right side. She struck her head,, right ribs and right flank and right shoulder pain (points to trapezius muscle )no loss of consciousness she complains of mild headache. Denies neck pain denies nausea or vomiting she complains of right rib pain right flank pain and right-sided headache. She got up and rode the horse after falling off it has been ambulatory since the event Past Medical History:  Diagnosis Date  . Allergy   . Anemia   . Anxiety   . Arthritis    knees, multiple joints  . Asthma   . Bell palsy   . Blood transfusion without reported diagnosis   . Breast mass    lt breast mass x's 3 years increased in size  . Chronic kidney disease   . COPD (chronic obstructive pulmonary disease) ([place])   . Depression   . Diabetes mellitus   . Endometrial cancer ([place])   . Fibromyalgia   . GERD (gastroesophageal reflux disease)   . 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 Hyperlipidemia   . Hypertension   . Hypothyroidism   . Multiple pulmonary nodules 11/30/2012   Followed in Pulmonary clinic/ [name]/ [place]  - See CT abd  10/29/12  New right lower lobe pulmonary nodularity, primarily ground-  glass in density. This could reflect an inflammatory process,  although follow-up is necessary to exclude atypical neoplasm. Full  chest CT should be considered to evaluate for other pulmonary  findings.     . Multiple thyroid nodules   . PONV (postoperative nausea and vomiting)   . Pulmonary nodules   . Thyroid disease   . Ulcer   . Uterine cancer ([place]) 12/26/2011   Stage 1, grade 1, S/P hysterectomy initially by robotic technique and then salpingo-oophorectomy at [facility] receiving no postoperative  treatment.    Patient Active Problem List   Diagnosis Date Noted  . Bee sting allergy 07/07/2016  . Attention deficit disorder (ADD) in adult 07/07/2016  . Chronic insomnia 07/07/2016  . Vitamin D deficiency 07/07/2016  . Migraine syndrome 07/07/2016  . Osteoarthritis of both knees 07/07/2016  . Fibromyalgia syndrome 07/07/2016  . S/P gastric bypass 07/07/2016  . HLD (hyperlipidemia) 07/07/2016  . Morbid obesity due to excess calories ([place]) 03/10/2015  . Diabetes mellitus without complication ([place]) 00/17/4944  . Nontoxic multinodular goiter 03/09/2015  . Essential hypertension, benign 03/09/2015  . 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 IDA (iron deficiency anemia) 12/27/2013  . Ureteral calculus, left 11/17/2013  . SOB (shortness of breath) 11/30/2012    Past Surgical History:  Procedure Laterality Date  . ABDOMINAL HYSTERECTOMY     endometrial cancer  . CHOLECYSTECTOMY    . COLONOSCOPY WITH PROPOFOL N/A 01/19/2014   [id number]  . DILATION AND CURETTAGE OF UTERUS  12 yrs ago  . DILATION AND CURETTAGE OF UTERUS  06/18/2011   Procedure: DILATATION AND CURETTAGE;  Surgeon: Florian Buff, MD;  Location: [facility];  Service: Gynecology;  Laterality: N/A;  Suction Dilation and Curettage  . ESOPHAGOGASTRODUODENOSCOPY N/A 10/12/2013   Dr. Rourk:anastomotic ulcer likely cause of bleeding. likely ischemic   . ESOPHAGOGASTRODUODENOSCOPY (EGD) WITH PROPOFOL N/A 01/19/2014  VHQ:[id number]  . GASTRIC BYPASS  2013   [facility]  . HYSTEROSCOPY W/D&C  06/18/2011   Procedure: DILATATION AND CURETTAGE /HYSTEROSCOPY;  Surgeon: Florian Buff, MD;  Location: [facility];  Service: Gynecology;  Laterality: N/A;  . LITHOTRIPSY    . paniculectomy    . TRIGGER FINGER RELEASE      OB History    No data available       Home Medications    Prior to Admission medications   Medication Sig Start Date End Date Taking? Authorizing Provider  albuterol (PROVENTIL HFA;VENTOLIN HFA) 108 (90 BASE) MCG/ACT inhaler  Inhale 2 puffs into the lungs every 6 (six) hours as needed. For asthma     [provider]  ALPRAZolam Duanne Moron) 1 MG tablet Take 1.5 mg by mouth at bedtime as needed. FOR ANXIETY AND SLEEP    [provider]  amphetamine-dextroamphetamine (ADDERALL XR) 30 MG 24 hr capsule Take 60 mg by mouth daily.     [provider]  artificial tears (LACRILUBE) OINT ophthalmic ointment Place into the right eye every 4 (four) hours as needed for dry eyes. Patient not taking: Reported on 10/30/2016 05/06/15   Ripley Fraise, MD  canagliflozin [facility]) 100 MG TABS tablet Take by mouth daily before breakfast.    [provider]  diphenhydrAMINE (BENADRYL) 25 mg capsule Take 25-75 mg by mouth See admin instructions. Taking 1 Capsule daily as needed for allergic reaction and takes 3 capsules at bedtime    [provider]  DULoxetine (CYMBALTA) 60 MG capsule Take 120 mg by mouth every morning.     [provider]  ergocalciferol (VITAMIN D2) 50000 UNITS capsule Take 1 capsule (50,000 Units total) by mouth once a week. Patient taking differently: Take 50,000 Units by mouth 2 (two) times a week.  11/01/14   Baird Cancer, PA-C  furosemide (LASIX) 20 MG tablet Take 20 mg by mouth daily as needed for fluid.  02/15/14   [provider]  gabapentin (NEURONTIN) 400 MG capsule Take 400 mg by mouth 3 (three) times daily. 800 mg in am, 400 mg twice daily.    [provider]  lamoTRIgine (LAMICTAL) 150 MG tablet Take 150 mg by mouth 2 (two) times daily.    [provider]  lisinopril (PRINIVIL,ZESTRIL) 40 MG tablet Take 40 mg by mouth daily as needed. Take when blood pressure "spikes" per pt 03/27/14   [provider]  nitrofurantoin, macrocrystal-monohydrate, (MACROBID) 100 MG capsule Take 1 capsule (100 mg total) by mouth 2 (two) times daily. 11/07/16   Shawnee Knapp, MD  ondansetron (ZOFRAN) 8 MG tablet Take 8 mg by mouth every 8 (eight)  hours as needed for nausea or vomiting.  11/24/13   [provider]  Oxycodone HCl 10 MG TABS Take 1 tab po qid x 1 wk, then 1 tab po tid x 1 wk, then 1 tab po bid x 1 wk, then 1 tab po qd x 1 wk, then stop 11/07/16   Shawnee Knapp, MD  Oxycodone HCl 20 MG TABS Take 1 tab po qid x 2 weeks, then take 1 tab po tid x 2 weeks 11/07/16   Shawnee Knapp, MD  pantoprazole (PROTONIX) 40 MG tablet TAKE (1) TABLET TWICE DAILY. 04/01/16   Annitta Needs, NP  pioglitazone (ACTOS) 15 MG tablet Take 15 mg by mouth daily.    [provider]  simvastatin (ZOCOR) 20 MG tablet  05/29/16   [provider]  SUMAtriptan (IMITREX) 100 MG tablet Take 100 mg by mouth as needed for migraine.  02/18/13   [provider]  tiZANidine (ZANAFLEX) 4 MG tablet Take 4 mg by mouth at bedtime. For spasms.    [provider]  vortioxetine HBr (TRINTELLIX) 20 MG TABS Take by mouth.    [provider]    Family History Family History  Problem Relation Age of Onset  . Pneumonia Mother        Deceased  . Arthritis Mother   . Asthma Mother   . Cancer Mother        pancreatic  . COPD Mother   . Depression Mother   . Diabetes Mother   . Kidney disease Mother   . Liver cancer Father        Living  . Arthritis Father   . Cancer Father        liver, prostate  . COPD Father   . Depression Father   . Stroke Father   . Heart disease Maternal Grandmother   . Mental illness Maternal Grandmother   . Breast cancer Maternal Grandmother   . Alcohol abuse Paternal Grandfather   . Breast cancer Paternal Aunt   . Anesthesia problems Neg Hx   . Hypotension Neg Hx   . Malignant hyperthermia Neg Hx   . Pseudochol deficiency Neg Hx   . Colon cancer Neg Hx     Social History Social History  Substance Use Topics  . Smoking status: Never Smoker  . Smokeless tobacco: Never Used  . Alcohol use No     Allergies   Cephalexin; Vancomycin; Adhesive [tape]; Cefotaxime; Ciprofloxacin;  Clindamycin; Latex; Penicillins; and Sulfa antibiotics   Review of Systems Review of Systems  Respiratory:       Right rib pain  Genitourinary: Positive for flank pain.       Right flank pain  Allergic/Immunologic: Positive for immunocompromised state.       Diabetic  Neurological: Positive for headaches.  All other systems reviewed and are negative.    Physical Exam Updated Vital Signs BP (!) 157/74 (BP Location: Left Arm)   Pulse 78   Temp 97.9 F (36.6 C) (Oral)   Resp 18   Ht 5\' 4"  (1.626 m)   Wt 109.8 kg (242 lb)   LMP 05/06/2011   SpO2 95%   BMI 41.54 kg/m   Physical Exam  Constitutional: She appears well-developed and well-nourished.  HENT:  Head: Normocephalic and atraumatic.  Right Ear: External ear normal.  Left Ear: External ear normal.  BiLateral tympanic membranes normal  Eyes: Pupils are equal, round, and reactive to light. Conjunctivae are normal.  Neck: Neck supple. No tracheal deviation present. No thyromegaly present.  Cardiovascular: Normal rate and regular rhythm.   No murmur heard. Pulmonary/Chest: Effort normal and breath sounds normal. She exhibits tenderness.  Right right rib cage midaxillary line  Abdominal: Soft. Bowel sounds are normal. She exhibits no distension.  Obese, tender right upper quadrant  Musculoskeletal: Normal range of motion. She exhibits no edema or tenderness.  Entire spine nontender. Pelvis stable nontender. All 4 extremities all contusion abrasion or tenderness neurovascularly intact  Neurological: She is alert. Coordination normal.  Skin: Skin is warm and dry. No rash noted.  Psychiatric: She has a normal mood and affect.  Nursing note and vitals reviewed.    ED Treatments / Results  Labs (all labs ordered are listed, but only abnormal results are displayed) Labs Reviewed - No data to display  Results for orders placed or performed during the hospital encounter of 65/68/12  Basic metabolic panel  Result Value  Ref Range   Sodium 140 135 - 145 mmol/L   Potassium 3.1 (L) 3.5 - 5.1 mmol/L   Chloride 105 101 - 111 mmol/L   CO2 27 22 - 32 mmol/L   Glucose, Bld 105 (H) 65 - 99 mg/dL   BUN 15 6 - 20 mg/dL   Creatinine, Ser 0.88 0.44 - 1.00 mg/dL   Calcium 9.1 8.9 - 10.3 mg/dL   GFR calc non Af Amer >60 >60 mL/min   GFR calc Af Amer >60 >60 mL/min   Anion gap 8 5 - 15  Chest x-ray viewed by me Dg Chest Port 1 View  Result Date: 12/28/2016 CLINICAL DATA:  [place] off horse, right sided ribcage pain EXAM: PORTABLE CHEST 1 VIEW COMPARISON:  11/07/2013 FINDINGS: Borderline to mild cardiomegaly. No consolidation or pleural effusion. No pneumothorax. IMPRESSION: No active disease.  Borderline cardiomegaly Electronically Signed   By: Donavan Foil M.D.   On: 12/28/2016 21:12   EKG  EKG Interpretation None      Chest x-ray viewed by me Radiology No results found.  Procedures Procedures (including critical care time)  Medications Ordered in ED Medications  HYDROmorphone (DILAUDID) injection 1 mg (not administered)   10:05 PM pain improved after treatment with intravenous hydromorphone however requesting more pain medicine. Additional IV hydromorphone ordered. Pt signed out to Dr. Thurnell Garbe [phone] pnm  Initial Impression / Assessment and Plan / ED Course  I have reviewed the triage vital signs and the nursing notes.  Pertinent labs & imaging results that were available during my care of the patient were reviewed by me and considered in my medical decision making (see chart for details).       Final Clinical Impressions(s) / ED Diagnoses  Diagnosis #1 fall #2 hypokalemia Final diagnoses:  None    New Prescriptions New Prescriptions   No medications on file     Orlie Dakin, MD 12/28/16 2213

## 2016-12-28 NOTE — ED Notes (Signed)
Pt riding horses on trails with spouse and friends in [place] when he hourse threw her and he landed flat on her R side-  Now with R abd pain, R shoulder pain, neck pain   Denies LOC, but states " rang my bell"

## 2016-12-28 NOTE — ED Notes (Signed)
To CT via stretcher

## 2016-12-28 NOTE — ED Triage Notes (Signed)
Pt reports being "bucked" off a horse 5 hours ago, landing on her R side. Denies LOC. Now c/o pain to R head, shoulder, rib cage, and buttock pain.

## 2017-01-21 ENCOUNTER — Encounter (HOSPITAL_COMMUNITY): Payer: Self-pay | Admitting: Emergency Medicine

## 2017-01-21 ENCOUNTER — Emergency Department (HOSPITAL_COMMUNITY)
Admission: EM | Admit: 2017-01-21 | Discharge: 2017-01-22 | Disposition: A | Discharge status: Home / Self Care | Payer: BLUE CROSS/BLUE SHIELD | Attending: Emergency Medicine | Admitting: Emergency Medicine

## 2017-01-21 DIAGNOSIS — K047 Periapical abscess without sinus: Secondary | ICD-10-CM | POA: Diagnosis not present

## 2017-01-21 DIAGNOSIS — Z8542 Personal history of malignant neoplasm of other parts of uterus: Secondary | ICD-10-CM | POA: Insufficient documentation

## 2017-01-21 DIAGNOSIS — J45909 Unspecified asthma, uncomplicated: Secondary | ICD-10-CM | POA: Insufficient documentation

## 2017-01-21 DIAGNOSIS — E039 Hypothyroidism, unspecified: Secondary | ICD-10-CM | POA: Insufficient documentation

## 2017-01-21 DIAGNOSIS — E1122 Type 2 diabetes mellitus with diabetic chronic kidney disease: Secondary | ICD-10-CM | POA: Insufficient documentation

## 2017-01-21 DIAGNOSIS — Z791 Long term (current) use of non-steroidal anti-inflammatories (NSAID): Secondary | ICD-10-CM | POA: Diagnosis not present

## 2017-01-21 DIAGNOSIS — Z79899 Other long term (current) drug therapy: Secondary | ICD-10-CM | POA: Insufficient documentation

## 2017-01-21 DIAGNOSIS — I129 Hypertensive chronic kidney disease with stage 1 through stage 4 chronic kidney disease, or unspecified chronic kidney disease: Secondary | ICD-10-CM | POA: Insufficient documentation

## 2017-01-21 DIAGNOSIS — N189 Chronic kidney disease, unspecified: Secondary | ICD-10-CM | POA: Insufficient documentation

## 2017-01-21 DIAGNOSIS — Z91040 Latex allergy status: Secondary | ICD-10-CM | POA: Insufficient documentation

## 2017-01-21 DIAGNOSIS — K0889 Other specified disorders of teeth and supporting structures: Secondary | ICD-10-CM | POA: Diagnosis present

## 2017-01-21 MED ORDER — OXYCODONE-ACETAMINOPHEN 5-325 MG PO TABS
2.0000 | ORAL_TABLET | Freq: Once | ORAL | Status: AC
  Administered 2017-01-21: 2 via ORAL
  Filled 2017-01-21: qty 2

## 2017-01-21 MED ORDER — HYDROMORPHONE HCL 1 MG/ML IJ SOLN
1.0000 mg | Freq: Once | INTRAMUSCULAR | Status: AC
  Administered 2017-01-21: 1 mg via INTRAVENOUS
  Filled 2017-01-21: qty 1

## 2017-01-21 MED ORDER — LORAZEPAM 1 MG PO TABS
1.0000 mg | ORAL_TABLET | Freq: Once | ORAL | Status: AC
  Administered 2017-01-21: 1 mg via ORAL
  Filled 2017-01-21: qty 1

## 2017-01-21 NOTE — ED Triage Notes (Signed)
Pt C/O of facial swelling and pain to the entire right side of her face.

## 2017-01-21 NOTE — ED Provider Notes (Signed)
[facility] DEPT Provider Note   CSN: [id number] Arrival date & time: 01/21/17  2302     History   Chief Complaint Chief Complaint  Patient presents with  . Angioedema    HPI Molly Berry is a 51 y.o. female.  Patient presents with sudden onset of jaw and dental pain around 4 PM today. Denies trauma. She has poor dentition baseline. She has severe pain of right lower jaw and feels that her face is swelling. Denies any difficulty breathing or difficulty swallowing. No chest pain or shortness of breath. She took ibuprofen at home without relief. Nursing staff concerned for angioedema because she is on lisinopril but there is no evidence of any tongue, lip or throat swelling. Patient is controlling her secretions. Patient with history of fibromyalgia and anxiety, diabetes, high blood pressure   The history is provided by the patient and a relative.    Past Medical History:  Diagnosis Date  . Allergy   . Anemia   . Anxiety   . Arthritis    knees, multiple joints  . Asthma   . Bell palsy   . Blood transfusion without reported diagnosis   . Breast mass    lt breast mass x's 3 years increased in size  . Chronic kidney disease   . COPD (chronic obstructive pulmonary disease) ([place])   . Depression   . Diabetes mellitus   . Endometrial cancer ([place])   . Fibromyalgia   . GERD (gastroesophageal reflux disease)   . 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 Hyperlipidemia   . Hypertension   . Hypothyroidism   . Multiple pulmonary nodules 11/30/2012   Followed in Pulmonary clinic/ [facility]/ [place]  - See CT abd  10/29/12  New right lower lobe pulmonary nodularity, primarily ground-  glass in density. This could reflect an inflammatory process,  although follow-up is necessary to exclude atypical neoplasm. Full  chest CT should be considered to evaluate for other pulmonary  findings.     . Multiple thyroid nodules   . PONV (postoperative nausea and vomiting)   .  Pulmonary nodules   . Thyroid disease   . Ulcer   . Uterine cancer ([place]) 12/26/2011   Stage 1, grade 1, S/P hysterectomy initially by robotic technique and then salpingo-oophorectomy at [facility] receiving no postoperative treatment.    Patient Active Problem List   Diagnosis Date Noted  . Bee sting allergy 07/07/2016  . Attention deficit disorder (ADD) in adult 07/07/2016  . Chronic insomnia 07/07/2016  . Vitamin D deficiency 07/07/2016  . Migraine syndrome 07/07/2016  . Osteoarthritis of both knees 07/07/2016  . Fibromyalgia syndrome 07/07/2016  . S/P gastric bypass 07/07/2016  . HLD (hyperlipidemia) 07/07/2016  . Morbid obesity due to excess calories ([place]) 03/10/2015  . Diabetes mellitus without complication ([place]) 46/96/2952  . Nontoxic multinodular goiter 03/09/2015  . Essential hypertension, benign 03/09/2015  . 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 IDA (iron deficiency anemia) 12/27/2013  . Ureteral calculus, left 11/17/2013  . SOB (shortness of breath) 11/30/2012    Past Surgical History:  Procedure Laterality Date  . ABDOMINAL HYSTERECTOMY     endometrial cancer  . CHOLECYSTECTOMY    . COLONOSCOPY WITH PROPOFOL N/A 01/19/2014   [id number]  . DILATION AND CURETTAGE OF UTERUS  12 yrs ago  . DILATION AND CURETTAGE OF UTERUS  06/18/2011   Procedure: DILATATION AND CURETTAGE;  Surgeon: Florian Buff, MD;  Location: [facility];  Service: Gynecology;  Laterality:  N/A;  Suction Dilation and Curettage  . ESOPHAGOGASTRODUODENOSCOPY N/A 10/12/2013   Dr. Rourk:anastomotic ulcer likely cause of bleeding. likely ischemic   . ESOPHAGOGASTRODUODENOSCOPY (EGD) WITH PROPOFOL N/A 01/19/2014   [id number]  . GASTRIC BYPASS  2013   [facility]  . HYSTEROSCOPY W/D&C  06/18/2011   Procedure: DILATATION AND CURETTAGE /HYSTEROSCOPY;  Surgeon: Florian Buff, MD;  Location: [facility];  Service: Gynecology;  Laterality: N/A;  . LITHOTRIPSY    . paniculectomy    . TRIGGER FINGER RELEASE      OB History     No data available       Home Medications    Prior to Admission medications   Medication Sig Start Date End Date Taking? Authorizing Provider  albuterol (PROVENTIL HFA;VENTOLIN HFA) 108 (90 BASE) MCG/ACT inhaler Inhale 2 puffs into the lungs every 6 (six) hours as needed. For asthma     [provider]  ALPRAZolam Duanne Moron) 1 MG tablet Take 1.5 mg by mouth at bedtime as needed. FOR ANXIETY AND SLEEP    [provider]  amphetamine-dextroamphetamine (ADDERALL XR) 30 MG 24 hr capsule Take 60 mg by mouth daily.     [provider]  canagliflozin (INVOKANA) 100 MG TABS tablet Take by mouth daily before breakfast.    [provider]  diphenhydrAMINE (BENADRYL) 25 mg capsule Take 25-75 mg by mouth See admin instructions. Taking 1 Capsule daily as needed for allergic reaction and takes 3 capsules at bedtime    [provider]  furosemide (LASIX) 20 MG tablet Take 20 mg by mouth daily as needed for fluid.  02/15/14   [provider]  gabapentin (NEURONTIN) 400 MG capsule Take 400 mg by mouth 3 (three) times daily. 800 mg in am, 400 mg twice daily.    [provider]  HYDROcodone-acetaminophen (NORCO/VICODIN) 5-325 MG tablet 1 or 2 tabs PO q6 hours prn pain 12/28/16   Francine Graven, DO  ibuprofen (ADVIL,MOTRIN) 200 MG tablet Take 600 mg by mouth every 6 (six) hours as needed for headache or moderate pain.    [provider]  lamoTRIgine (LAMICTAL) 150 MG tablet Take 150 mg by mouth 2 (two) times daily.    [provider]  lisinopril (PRINIVIL,ZESTRIL) 40 MG tablet Take 40 mg by mouth daily as needed. Take when blood pressure "spikes" per pt 03/27/14   [provider]  meloxicam (MOBIC) 15 MG tablet Take 1 tablet by mouth daily. 12/25/16   [provider]  methocarbamol (ROBAXIN) 500 MG tablet Take 2 tablets (1,000 mg total) by mouth 4 (four) times daily as needed for muscle spasms (muscle spasm/pain).  12/28/16   Francine Graven, DO  metoprolol succinate (TOPROL-XL) 50 MG 24 hr tablet Take 1 tablet by mouth daily. 12/10/16   [provider]  ondansetron (ZOFRAN) 8 MG tablet Take 8 mg by mouth every 8 (eight) hours as needed for nausea or vomiting.  11/24/13   [provider]  pantoprazole (PROTONIX) 40 MG tablet TAKE (1) TABLET TWICE DAILY. 04/01/16   Annitta Needs, NP  potassium chloride (K-DUR) 10 MEQ tablet Take 1 tablet by mouth daily. 12/11/16   [provider]  simvastatin (ZOCOR) 20 MG tablet Take 20 mg by mouth daily.  05/29/16   [provider]  SUMAtriptan (IMITREX) 100 MG tablet Take 1 tablet by mouth daily as needed. 07/12/16   [provider]  tiZANidine (ZANAFLEX) 4 MG tablet Take 4 mg by mouth at bedtime. For spasms.  [provider]  vortioxetine HBr (TRINTELLIX) 20 MG TABS Take 20 mg by mouth daily.     [provider]    Family History Family History  Problem Relation Age of Onset  . Pneumonia Mother        Deceased  . Arthritis Mother   . Asthma Mother   . Cancer Mother        pancreatic  . COPD Mother   . Depression Mother   . Diabetes Mother   . Kidney disease Mother   . Liver cancer Father        Living  . Arthritis Father   . Cancer Father        liver, prostate  . COPD Father   . Depression Father   . Stroke Father   . Heart disease Maternal Grandmother   . Mental illness Maternal Grandmother   . Breast cancer Maternal Grandmother   . Alcohol abuse Paternal Grandfather   . Breast cancer Paternal Aunt   . Anesthesia problems Neg Hx   . Hypotension Neg Hx   . Malignant hyperthermia Neg Hx   . Pseudochol deficiency Neg Hx   . Colon cancer Neg Hx     Social History Social History  Substance Use Topics  . Smoking status: Never Smoker  . Smokeless tobacco: Never Used  . Alcohol use No     Allergies   Bee venom; Cephalexin; Vancomycin; Adhesive [tape]; Cefotaxime; Ciprofloxacin;  Clindamycin; Latex; Penicillins; and Sulfa antibiotics   Review of Systems Review of Systems  Constitutional: Negative for activity change, appetite change and fever.  HENT: Positive for dental problem and facial swelling. Negative for drooling, postnasal drip, rhinorrhea and sore throat.   Eyes: Negative for visual disturbance.  Respiratory: Negative for cough and shortness of breath.   Cardiovascular: Negative for chest pain.  Gastrointestinal: Negative for abdominal pain, nausea and vomiting.  Genitourinary: Negative for dysuria and hematuria.  Musculoskeletal: Negative for arthralgias and myalgias.  Neurological: Negative for dizziness, weakness and headaches.    all other systems are negative except as noted in the HPI and PMH.    Physical Exam Updated Vital Signs BP (!) 178/103 (BP Location: Left Arm)   Pulse 78   Temp 98.3 F (36.8 C) (Oral)   Resp 20   LMP 05/06/2011   SpO2 100%   Physical Exam  Constitutional: She is oriented to person, place, and time. She appears well-developed and well-nourished. No distress.  Anxious, tearful  HENT:  Head: Normocephalic and atraumatic.  Mouth/Throat: Oropharynx is clear and moist. No oropharyngeal exudate.  Poor dentition throughout with multiple missing, fractured teeth and caries. There is point tenderness to right lower incisor which is fractured. There is no appreciable fluctuance or drainage. The floor of mouth is soft. There is no tongue or lip swelling. Posterior pharynx is clear  Eyes: Pupils are equal, round, and reactive to light. Conjunctivae and EOM are normal.  Neck: Normal range of motion. Neck supple.  No meningismus.  Cardiovascular: Normal rate, regular rhythm, normal heart sounds and intact distal pulses.   No murmur heard. Pulmonary/Chest: Effort normal and breath sounds normal. No respiratory distress.  Abdominal: Soft. There is no tenderness. There is no rebound and no guarding.  Musculoskeletal: Normal  range of motion. She exhibits no edema or tenderness.  Neurological: She is alert and oriented to person, place, and time. No cranial nerve deficit. She exhibits normal muscle tone. Coordination normal.   5/5 strength throughout. CN 2-12 intact.Equal  grip strength.   Skin: Skin is warm.  Psychiatric: She has a normal mood and affect. Her behavior is normal.  Nursing note and vitals reviewed.    ED Treatments / Results  Labs (all labs ordered are listed, but only abnormal results are displayed) Labs Reviewed  BASIC METABOLIC PANEL - Abnormal; Notable for the following:       Result Value   Sodium 134 (*)    Potassium 3.1 (*)    Chloride 99 (*)    Creatinine, Ser 1.17 (*)    GFR calc non Af Amer 53 (*)    All other components within normal limits  CBC WITH DIFFERENTIAL/PLATELET    EKG  EKG Interpretation None       Radiology Ct Maxillofacial W Contrast  Result Date: 01/22/2017 CLINICAL DATA:  Facial pain and swelling to the right side of face EXAM: CT MAXILLOFACIAL WITH CONTRAST TECHNIQUE: Multidetector CT imaging of the maxillofacial structures was performed with intravenous contrast. Multiplanar CT image reconstructions were also generated. CONTRAST:  31mL ISOVUE-300 IOPAMIDOL (ISOVUE-300) INJECTION 61% COMPARISON:  None. FINDINGS: Osseous: --Complex facial fracture types: No [place], zygomaticomaxillary complex or nasoorbitoethmoidal fracture. --Simple fracture types: None. --Mandible, hard palate and teeth: There are numerous dental caries. There are periapical lucencies at teeth 28 and 29, which of the closest to the region of soft tissue inflammation. There is no associated abscess or drainable fluid collection. Orbits: The globes appear intact. Normal appearance of the intra- and extraconal fat. Symmetric extraocular muscles. Sinuses: No fluid levels or advanced mucosal thickening. Soft tissues: There is lower right facial soft tissue swelling adjacent to the right mandibular  body. No abscess or fluid collection. There are multiple prominent nearby lymph nodes. Limited intracranial: Normal. IMPRESSION: 1. Lower right facial cellulitis, likely odontogenic, with periapical lucencies of teeth 28 and 29 in close proximity to the area of inflammation. 2. No abscess or drainable fluid collection. 3. Numerous dental caries of the upper and lower teeth. Electronically Signed   By: Ulyses Jarred M.D.   On: 01/22/2017 01:48    Procedures Procedures (including critical care time)  Medications Ordered in ED Medications  oxyCODONE-acetaminophen (PERCOCET/ROXICET) 5-325 MG per tablet 2 tablet (not administered)  LORazepam (ATIVAN) tablet 1 mg (not administered)     Initial Impression / Assessment and Plan / ED Course  I have reviewed the triage vital signs and the nursing notes.  Pertinent labs & imaging results that were available during my care of the patient were reviewed by me and considered in my medical decision making (see chart for details).     Patient with acute onset abdominal pain with jaw swelling. She has no tongue or lip swelling.   CT shows evidence of cellulitis without drainable abscess. No evidence of Ludwig's angina. No evidence of angioedema.  Patient multiple antibiotic allergies. Reports she has tolerated clindamycin in the past.  Labs with hypokalemia which was replaced.  Patient given IV clindamycin in the ED without adverse reaction.  Discussed need to follow-up with dentistry ASAP for extractions. Return precautions discussed. Narcotic database reviewed with multiple narcotic prescriptions from multiple providers. Patient will not be given narcotic prescriptions Final Clinical Impressions(s) / ED Diagnoses   Final diagnoses:  Dental infection    New Prescriptions New Prescriptions   No medications on file     Ezequiel Essex, MD 01/22/17 0236

## 2017-01-22 ENCOUNTER — Emergency Department (HOSPITAL_COMMUNITY): Payer: BLUE CROSS/BLUE SHIELD

## 2017-01-22 LAB — BASIC METABOLIC PANEL
Anion gap: 11 (ref 5–15)
BUN: 19 mg/dL (ref 6–20)
CALCIUM: 8.9 mg/dL (ref 8.9–10.3)
CO2: 24 mmol/L (ref 22–32)
CREATININE: 1.17 mg/dL — AB (ref 0.44–1.00)
Chloride: 99 mmol/L — ABNORMAL LOW (ref 101–111)
GFR, EST NON AFRICAN AMERICAN: 53 mL/min — AB (ref >60)
Glucose, Bld: 96 mg/dL (ref 65–99)
Potassium: 3.1 mmol/L — ABNORMAL LOW (ref 3.5–5.1)
Sodium: 134 mmol/L — ABNORMAL LOW (ref 135–145)

## 2017-01-22 LAB — CBC WITH DIFFERENTIAL/PLATELET
Basophils Absolute: 0 10*3/uL (ref 0.0–0.1)
Basophils Relative: 1 %
Eosinophils Absolute: 0.1 10*3/uL (ref 0.0–0.7)
Eosinophils Relative: 1 %
HEMATOCRIT: 43.5 % (ref 36.0–46.0)
HEMOGLOBIN: 13.8 g/dL (ref 12.0–15.0)
LYMPHS ABS: 0.9 10*3/uL (ref 0.7–4.0)
LYMPHS PCT: 14 %
MCH: 28.1 pg (ref 26.0–34.0)
MCHC: 31.7 g/dL (ref 30.0–36.0)
MCV: 88.6 fL (ref 78.0–100.0)
MONOS PCT: 8 %
Monocytes Absolute: 0.5 10*3/uL (ref 0.1–1.0)
NEUTROS ABS: 4.9 10*3/uL (ref 1.7–7.7)
NEUTROS PCT: 76 %
PLATELETS: 281 10*3/uL (ref 150–400)
RBC: 4.91 MIL/uL (ref 3.87–5.11)
RDW: 13.7 % (ref 11.5–15.5)
WBC: 6.4 10*3/uL (ref 4.0–10.5)

## 2017-01-22 MED ORDER — NAPROXEN 500 MG PO TABS: 500.0000 mg | ORAL_TABLET | Freq: Two times a day (BID) | ORAL | 0 refills | Status: DC

## 2017-01-22 MED ORDER — CLINDAMYCIN HCL 300 MG PO CAPS: 300.0000 mg | ORAL_CAPSULE | Freq: Three times a day (TID) | ORAL | 0 refills | Status: DC

## 2017-01-22 MED ORDER — POTASSIUM CHLORIDE CRYS ER 20 MEQ PO TBCR
40.0000 meq | EXTENDED_RELEASE_TABLET | Freq: Once | ORAL | Status: AC
  Administered 2017-01-22: 40 meq via ORAL
  Filled 2017-01-22: qty 2

## 2017-01-22 MED ORDER — IOPAMIDOL (ISOVUE-300) INJECTION 61%
75.0000 mL | Freq: Once | INTRAVENOUS | Status: AC | PRN
  Administered 2017-01-22: 75 mL via INTRAVENOUS

## 2017-01-22 MED ORDER — CLINDAMYCIN PHOSPHATE 600 MG/50ML IV SOLN
600.0000 mg | Freq: Once | INTRAVENOUS | Status: AC
  Administered 2017-01-22: 600 mg via INTRAVENOUS
  Filled 2017-01-22: qty 50

## 2017-01-22 NOTE — ED Notes (Signed)
Pt complain of left arm pain. MD notified.

## 2017-07-14 ENCOUNTER — Emergency Department (HOSPITAL_COMMUNITY): Payer: BLUE CROSS/BLUE SHIELD

## 2017-07-14 ENCOUNTER — Other Ambulatory Visit: Payer: Self-pay

## 2017-07-14 ENCOUNTER — Emergency Department (HOSPITAL_COMMUNITY)
Admission: EM | Admit: 2017-07-14 | Discharge: 2017-07-14 | Disposition: A | Discharge status: Home / Self Care | Payer: BLUE CROSS/BLUE SHIELD | Attending: Emergency Medicine | Admitting: Emergency Medicine

## 2017-07-14 ENCOUNTER — Encounter (HOSPITAL_COMMUNITY): Payer: Self-pay | Admitting: Emergency Medicine

## 2017-07-14 DIAGNOSIS — J449 Chronic obstructive pulmonary disease, unspecified: Secondary | ICD-10-CM | POA: Insufficient documentation

## 2017-07-14 DIAGNOSIS — R0789 Other chest pain: Secondary | ICD-10-CM | POA: Diagnosis present

## 2017-07-14 DIAGNOSIS — Z7984 Long term (current) use of oral hypoglycemic drugs: Secondary | ICD-10-CM | POA: Insufficient documentation

## 2017-07-14 DIAGNOSIS — E1122 Type 2 diabetes mellitus with diabetic chronic kidney disease: Secondary | ICD-10-CM | POA: Diagnosis not present

## 2017-07-14 DIAGNOSIS — Z91040 Latex allergy status: Secondary | ICD-10-CM | POA: Insufficient documentation

## 2017-07-14 DIAGNOSIS — R110 Nausea: Secondary | ICD-10-CM | POA: Diagnosis not present

## 2017-07-14 DIAGNOSIS — J45909 Unspecified asthma, uncomplicated: Secondary | ICD-10-CM | POA: Diagnosis not present

## 2017-07-14 DIAGNOSIS — Z8541 Personal history of malignant neoplasm of cervix uteri: Secondary | ICD-10-CM | POA: Insufficient documentation

## 2017-07-14 DIAGNOSIS — E039 Hypothyroidism, unspecified: Secondary | ICD-10-CM | POA: Diagnosis not present

## 2017-07-14 DIAGNOSIS — N189 Chronic kidney disease, unspecified: Secondary | ICD-10-CM | POA: Insufficient documentation

## 2017-07-14 DIAGNOSIS — R1013 Epigastric pain: Secondary | ICD-10-CM | POA: Diagnosis not present

## 2017-07-14 DIAGNOSIS — Z9049 Acquired absence of other specified parts of digestive tract: Secondary | ICD-10-CM | POA: Diagnosis not present

## 2017-07-14 DIAGNOSIS — Z9884 Bariatric surgery status: Secondary | ICD-10-CM | POA: Diagnosis not present

## 2017-07-14 DIAGNOSIS — I129 Hypertensive chronic kidney disease with stage 1 through stage 4 chronic kidney disease, or unspecified chronic kidney disease: Secondary | ICD-10-CM | POA: Diagnosis not present

## 2017-07-14 LAB — COMPREHENSIVE METABOLIC PANEL
ALK PHOS: 95 U/L (ref 38–126)
ALT: 19 U/L (ref 14–54)
ANION GAP: 9 (ref 5–15)
AST: 16 U/L (ref 15–41)
Albumin: 3.7 g/dL (ref 3.5–5.0)
BUN: 19 mg/dL (ref 6–20)
CALCIUM: 9.1 mg/dL (ref 8.9–10.3)
CHLORIDE: 108 mmol/L (ref 101–111)
CO2: 20 mmol/L — ABNORMAL LOW (ref 22–32)
CREATININE: 0.93 mg/dL (ref 0.44–1.00)
Glucose, Bld: 112 mg/dL — ABNORMAL HIGH (ref 65–99)
Potassium: 4.2 mmol/L (ref 3.5–5.1)
Sodium: 137 mmol/L (ref 135–145)
Total Bilirubin: 1 mg/dL (ref 0.3–1.2)
Total Protein: 7 g/dL (ref 6.5–8.1)

## 2017-07-14 LAB — LIPASE, BLOOD: LIPASE: 23 U/L (ref 11–51)

## 2017-07-14 LAB — I-STAT TROPONIN, ED
TROPONIN I, POC: 0 ng/mL (ref 0.00–0.08)
TROPONIN I, POC: 0 ng/mL (ref 0.00–0.08)

## 2017-07-14 LAB — CBC
HCT: 35.3 % — ABNORMAL LOW (ref 36.0–46.0)
Hemoglobin: 11.2 g/dL — ABNORMAL LOW (ref 12.0–15.0)
MCH: 28.9 pg (ref 26.0–34.0)
MCHC: 31.7 g/dL (ref 30.0–36.0)
MCV: 91.2 fL (ref 78.0–100.0)
Platelets: 328 10*3/uL (ref 150–400)
RBC: 3.87 MIL/uL (ref 3.87–5.11)
RDW: 15.2 % (ref 11.5–15.5)
WBC: 6.9 10*3/uL (ref 4.0–10.5)

## 2017-07-14 LAB — I-STAT BETA HCG BLOOD, ED (MC, WL, AP ONLY): I-stat hCG, quantitative: 7.5 m[IU]/mL — ABNORMAL HIGH (ref <5)

## 2017-07-14 MED ORDER — METOCLOPRAMIDE HCL 5 MG/ML IJ SOLN
10.0000 mg | Freq: Once | INTRAMUSCULAR | Status: AC
  Administered 2017-07-14: 10 mg via INTRAVENOUS
  Filled 2017-07-14: qty 2

## 2017-07-14 MED ORDER — DIPHENHYDRAMINE HCL 50 MG/ML IJ SOLN
25.0000 mg | Freq: Once | INTRAMUSCULAR | Status: AC
  Administered 2017-07-14: 25 mg via INTRAVENOUS
  Filled 2017-07-14: qty 1

## 2017-07-14 MED ORDER — SODIUM CHLORIDE 0.9 % IV BOLUS (SEPSIS)
500.0000 mL | Freq: Once | INTRAVENOUS | Status: AC
  Administered 2017-07-14: 500 mL via INTRAVENOUS

## 2017-07-14 MED ORDER — HYDROMORPHONE HCL 1 MG/ML IJ SOLN
1.0000 mg | Freq: Once | INTRAMUSCULAR | Status: AC
  Administered 2017-07-14: 1 mg via INTRAVENOUS
  Filled 2017-07-14: qty 1

## 2017-07-14 NOTE — ED Provider Notes (Signed)
[facility] EMERGENCY DEPARTMENT Provider Note   CSN: [id number] Arrival date & time: 07/14/17  0152     History   Chief Complaint Chief Complaint  Patient presents with  . Chest Pain    HPI Molly Berry is a 52 y.o. female.  Patient presents to the ER for evaluation of chest and abdominal pain.  Symptoms began around 6 PM.  She reports sharp pain under the right breast and in the center of her upper abdomen associated with severe nausea.  She has taken Zofran without improvement.  Pain worsens if she takes a deep breath but also if she touches the area or moves her torso.      Past Medical History:  Diagnosis Date  . Allergy   . Anemia   . Anxiety   . Arthritis    knees, multiple joints  . Asthma   . Bell palsy   . Blood transfusion without reported diagnosis   . Breast mass    lt breast mass x's 3 years increased in size  . Chronic kidney disease   . COPD (chronic obstructive pulmonary disease) ([place])   . Depression   . Diabetes mellitus   . Endometrial cancer ([place])   . Fibromyalgia   . GERD (gastroesophageal reflux disease)   . 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 Hyperlipidemia   . Hypertension   . Hypothyroidism   . Multiple pulmonary nodules 11/30/2012   Followed in Pulmonary clinic/ [facility]/ [place]  - See CT abd  10/29/12  New right lower lobe pulmonary nodularity, primarily ground-  glass in density. This could reflect an inflammatory process,  although follow-up is necessary to exclude atypical neoplasm. Full  chest CT should be considered to evaluate for other pulmonary  findings.     . Multiple thyroid nodules   . PONV (postoperative nausea and vomiting)   . Pulmonary nodules   . Thyroid disease   . Ulcer   . Uterine cancer ([place]) 12/26/2011   Stage 1, grade 1, S/P hysterectomy initially by robotic technique and then salpingo-oophorectomy at [facility] receiving no postoperative treatment.    Patient Active Problem List   Diagnosis Date  Noted  . Bee sting allergy 07/07/2016  . Attention deficit disorder (ADD) in adult 07/07/2016  . Chronic insomnia 07/07/2016  . Vitamin D deficiency 07/07/2016  . Migraine syndrome 07/07/2016  . Osteoarthritis of both knees 07/07/2016  . Fibromyalgia syndrome 07/07/2016  . S/P gastric bypass 07/07/2016  . HLD (hyperlipidemia) 07/07/2016  . Morbid obesity due to excess calories ([place]) 03/10/2015  . Diabetes mellitus without complication ([place]) 38/93/7342  . Nontoxic multinodular goiter 03/09/2015  . Essential hypertension, benign 03/09/2015  . 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 IDA (iron deficiency anemia) 12/27/2013  . Ureteral calculus, left 11/17/2013  . SOB (shortness of breath) 11/30/2012    Past Surgical History:  Procedure Laterality Date  . ABDOMINAL HYSTERECTOMY     endometrial cancer  . CHOLECYSTECTOMY    . COLONOSCOPY WITH PROPOFOL N/A 01/19/2014   AJG:[id number]  . DILATION AND CURETTAGE OF UTERUS  12 yrs ago  . DILATION AND CURETTAGE OF UTERUS  06/18/2011   Procedure: DILATATION AND CURETTAGE;  Surgeon: Florian Buff, MD;  Location: [facility];  Service: Gynecology;  Laterality: N/A;  Suction Dilation and Curettage  . ESOPHAGOGASTRODUODENOSCOPY N/A 10/12/2013   Dr. Rourk:anastomotic ulcer likely cause of bleeding. likely ischemic   . ESOPHAGOGASTRODUODENOSCOPY (EGD) WITH PROPOFOL N/A 01/19/2014   [facility]:CBULAG  .  GASTRIC BYPASS  2013   [facility]  . HYSTEROSCOPY W/D&C  06/18/2011   Procedure: DILATATION AND CURETTAGE /HYSTEROSCOPY;  Surgeon: Florian Buff, MD;  Location: [facility];  Service: Gynecology;  Laterality: N/A;  . LITHOTRIPSY    . paniculectomy    . TRIGGER FINGER RELEASE      OB History    No data available       Home Medications    Prior to Admission medications   Medication Sig Start Date End Date Taking? Authorizing Provider  albuterol (PROVENTIL HFA;VENTOLIN HFA) 108 (90 BASE) MCG/ACT inhaler Inhale 2 puffs into the lungs every 6 (six) hours as needed. For  asthma     [provider]  ALPRAZolam Duanne Moron) 1 MG tablet Take 1.5 mg by mouth at bedtime as needed. FOR ANXIETY AND SLEEP    [provider]  amphetamine-dextroamphetamine (ADDERALL XR) 30 MG 24 hr capsule Take 60 mg by mouth daily.     [provider]  canagliflozin (INVOKANA) 100 MG TABS tablet Take by mouth daily before breakfast.    [provider]  clindamycin (CLEOCIN) 300 MG capsule Take 1 capsule (300 mg total) by mouth 3 (three) times daily. 01/22/17   Rancour, Annie Main, MD  diphenhydrAMINE (BENADRYL) 25 mg capsule Take 25-75 mg by mouth See admin instructions. Taking 1 Capsule daily as needed for allergic reaction and takes 3 capsules at bedtime    [provider]  furosemide (LASIX) 20 MG tablet Take 20 mg by mouth daily as needed for fluid.  02/15/14   [provider]  gabapentin (NEURONTIN) 400 MG capsule Take 400 mg by mouth 3 (three) times daily. 800 mg in am, 400 mg twice daily.    [provider]  HYDROcodone-acetaminophen (NORCO/VICODIN) 5-325 MG tablet 1 or 2 tabs PO q6 hours prn pain 12/28/16   Francine Graven, DO  ibuprofen (ADVIL,MOTRIN) 200 MG tablet Take 600 mg by mouth every 6 (six) hours as needed for headache or moderate pain.    [provider]  lamoTRIgine (LAMICTAL) 150 MG tablet Take 150 mg by mouth 2 (two) times daily.    [provider]  lisinopril (PRINIVIL,ZESTRIL) 40 MG tablet Take 40 mg by mouth daily as needed. Take when blood pressure "spikes" per pt 03/27/14   [provider]  meloxicam (MOBIC) 15 MG tablet Take 1 tablet by mouth daily. 12/25/16   [provider]  methocarbamol (ROBAXIN) 500 MG tablet Take 2 tablets (1,000 mg total) by mouth 4 (four) times daily as needed for muscle spasms (muscle spasm/pain). 12/28/16   Francine Graven, DO  metoprolol succinate (TOPROL-XL) 50 MG 24 hr tablet Take 1 tablet by mouth daily. 12/10/16   [provider]    naproxen (NAPROSYN) 500 MG tablet Take 1 tablet (500 mg total) by mouth 2 (two) times daily. 01/22/17   Rancour, Annie Main, MD  ondansetron (ZOFRAN) 8 MG tablet Take 8 mg by mouth every 8 (eight) hours as needed for nausea or vomiting.  11/24/13   [provider]  pantoprazole (PROTONIX) 40 MG tablet TAKE (1) TABLET TWICE DAILY. 04/01/16   Annitta Needs, NP  potassium chloride (K-DUR) 10 MEQ tablet Take 1 tablet by mouth daily. 12/11/16   [provider]  simvastatin (ZOCOR) 20 MG tablet Take 20 mg by mouth daily.  05/29/16   [provider]  SUMAtriptan (IMITREX) 100 MG tablet Take 1 tablet by mouth daily as needed. 07/12/16   [provider]  tiZANidine (ZANAFLEX) 4  MG tablet Take 4 mg by mouth at bedtime. For spasms.    [provider]  vortioxetine HBr (TRINTELLIX) 20 MG TABS Take 20 mg by mouth daily.     [provider]    Family History Family History  Problem Relation Age of Onset  . Pneumonia Mother        Deceased  . Arthritis Mother   . Asthma Mother   . Cancer Mother        pancreatic  . COPD Mother   . Depression Mother   . Diabetes Mother   . Kidney disease Mother   . Liver cancer Father        Living  . Arthritis Father   . Cancer Father        liver, prostate  . COPD Father   . Depression Father   . Stroke Father   . Heart disease Maternal Grandmother   . Mental illness Maternal Grandmother   . Breast cancer Maternal Grandmother   . Alcohol abuse Paternal Grandfather   . Breast cancer Paternal Aunt   . Anesthesia problems Neg Hx   . Hypotension Neg Hx   . Malignant hyperthermia Neg Hx   . Pseudochol deficiency Neg Hx   . Colon cancer Neg Hx     Social History Social History   Tobacco Use  . Smoking status: Never Smoker  . Smokeless tobacco: Never Used  Substance Use Topics  . Alcohol use: No    Alcohol/week: 0.0 oz  . Drug use: No     Allergies   Bee venom; Cephalexin; Vancomycin; Adhesive [tape];  Cefotaxime; Ciprofloxacin; Clindamycin; Latex; Penicillins; and Sulfa antibiotics   Review of Systems Review of Systems  Cardiovascular: Positive for chest pain.  Gastrointestinal: Positive for abdominal pain and nausea.  All other systems reviewed and are negative.    Physical Exam Updated Vital Signs BP (!) 110/59   Pulse (!) 56   Temp 97.9 F (36.6 C) (Oral)   Resp 12   Wt 109.8 kg (242 lb)   LMP 05/06/2011   SpO2 97%   BMI 41.54 kg/m   Physical Exam  Constitutional: She is oriented to person, place, and time. She appears well-developed and well-nourished. No distress.  HENT:  Head: Normocephalic and atraumatic.  Right Ear: Hearing normal.  Left Ear: Hearing normal.  Nose: Nose normal.  Mouth/Throat: Oropharynx is clear and moist and mucous membranes are normal.  Eyes: Conjunctivae and EOM are normal. Pupils are equal, round, and reactive to light.  Neck: Normal range of motion. Neck supple.  Cardiovascular: Regular rhythm, S1 normal and S2 normal. Exam reveals no gallop and no friction rub.  No murmur heard. Pulmonary/Chest: Effort normal and breath sounds normal. No respiratory distress. She exhibits no tenderness.  Abdominal: Soft. Normal appearance and bowel sounds are normal. There is no hepatosplenomegaly. There is tenderness in the epigastric area. There is no rebound, no guarding, no tenderness at McBurney's point and negative Murphy's sign. No hernia.  Musculoskeletal: Normal range of motion.  Neurological: She is alert and oriented to person, place, and time. She has normal strength. No cranial nerve deficit or sensory deficit. Coordination normal. GCS eye subscore is 4. GCS verbal subscore is 5. GCS motor subscore is 6.  Skin: Skin is warm, dry and intact. No rash noted. No cyanosis.  Psychiatric: She has a normal mood and affect. Her speech is normal and behavior is normal. Thought content normal.  Nursing note and vitals reviewed.  ED Treatments /  Results  Labs (all labs ordered are listed, but only abnormal results are displayed) Labs Reviewed  CBC - Abnormal; Notable for the following components:      Result Value   Hemoglobin 11.2 (*)    HCT 35.3 (*)    All other components within normal limits  COMPREHENSIVE METABOLIC PANEL - Abnormal; Notable for the following components:   CO2 20 (*)    Glucose, Bld 112 (*)    All other components within normal limits  I-STAT BETA HCG BLOOD, ED ([facility], [facility], [facility] ONLY) - Abnormal; Notable for the following components:   I-stat hCG, quantitative 7.5 (*)    All other components within normal limits  LIPASE, BLOOD  I-STAT TROPONIN, ED    EKG  EKG Interpretation  Date/Time:  Tuesday July 14 2017 02:12:53 EDT Ventricular Rate:  64 PR Interval:    QRS Duration: 107 QT Interval:  402 QTC Calculation: 412 R Axis:   -12 Text Interpretation:  Sinus rhythm Low voltage, extremity and precordial leads Consider anterior infarct Confirmed by Orpah Greek [phone]) on 07/14/2017 3:09:46 AM       Radiology Dg Chest 2 View  Result Date: 07/14/2017 CLINICAL DATA:  Acute onset of mid to right-sided chest pain. Shortness of breath and cough. EXAM: CHEST - 2 VIEW COMPARISON:  Chest radiograph performed 12/28/2016 FINDINGS: The lungs are hypoexpanded but appear grossly clear. There is no evidence of focal opacification, pleural effusion or pneumothorax. The heart is borderline normal in size. No acute osseous abnormalities are seen. IMPRESSION: Lungs hypoexpanded but grossly clear. Electronically Signed   By: [name] M.D.   On: 07/14/2017 02:57    Procedures Procedures (including critical care time)  Medications Ordered in ED Medications  sodium chloride 0.9 % bolus 500 mL (0 mLs Intravenous Stopped 07/14/17 0424)  metoCLOPramide (REGLAN) injection 10 mg (10 mg Intravenous Given 07/14/17 0233)  diphenhydrAMINE (BENADRYL) injection 25 mg (25 mg Intravenous Given 07/14/17 0233)    HYDROmorphone (DILAUDID) injection 1 mg (1 mg Intravenous Given 07/14/17 0233)     Initial Impression / Assessment and Plan / ED Course  I have reviewed the triage vital signs and the nursing notes.  Pertinent labs & imaging results that were available during my care of the patient were reviewed by me and considered in my medical decision making (see chart for details).     Patient presents to the emergency department for evaluation of abdominal and chest pain.  Patient reports nausea and severe pain in the epigastric region which radiates up into the chest.  It hurts worse to take a deep breath, however, it hurts to move and press on the area as well.  Patient reports previous history of pancreatitis.  She is status post cholecystectomy.  Pain is sharp in nature and her epigastric area was extremely tender to the touch, pain was reproducible.  It is felt to be abdominal not cardiac in nature.  She has had an EKG and 2 troponins that are normal, this is adequate to rule out cardiac etiology in this very atypical pain.  Likewise, her lipase is normal.  She does not have a gallbladder.  Patient administered 1 dose of analgesia and antiemetics and has had complete resolution of pain.  Repeat examination reveals completely benign and nontender abdomen.  At this point I do not feel she requires any imaging, and is appropriate for discharge and outpatient follow-up with PCP, return if her symptoms reoccur.  Final Clinical  Impressions(s) / ED Diagnoses   Final diagnoses:  Epigastric pain    ED Discharge Orders    None       Lucielle Vokes, Gwenyth Allegra, MD 07/14/17 [phone]

## 2017-07-14 NOTE — ED Triage Notes (Signed)
Pt C/O chest pain that started around 1800 yesterday evening. Pt states she has been nauseated and SOB. Pt states that it hurts worse to take a deep breath.

## 2017-07-14 NOTE — ED Notes (Addendum)
Pt wheeled to car. Pt verbalized understanding of discharge instructions.  

## 2017-08-26 DIAGNOSIS — G5603 Carpal tunnel syndrome, bilateral upper limbs: Secondary | ICD-10-CM | POA: Insufficient documentation

## 2017-10-26 ENCOUNTER — Emergency Department (HOSPITAL_COMMUNITY): Payer: BLUE CROSS/BLUE SHIELD

## 2017-10-26 ENCOUNTER — Encounter (HOSPITAL_COMMUNITY): Payer: Self-pay | Admitting: Emergency Medicine

## 2017-10-26 ENCOUNTER — Emergency Department (HOSPITAL_COMMUNITY)
Admission: EM | Admit: 2017-10-26 | Discharge: 2017-10-26 | Disposition: A | Discharge status: Home / Self Care | Payer: BLUE CROSS/BLUE SHIELD | Attending: Emergency Medicine | Admitting: Emergency Medicine

## 2017-10-26 DIAGNOSIS — T50905A Adverse effect of unspecified drugs, medicaments and biological substances, initial encounter: Secondary | ICD-10-CM

## 2017-10-26 DIAGNOSIS — R2681 Unsteadiness on feet: Secondary | ICD-10-CM | POA: Insufficient documentation

## 2017-10-26 DIAGNOSIS — T426X5A Adverse effect of other antiepileptic and sedative-hypnotic drugs, initial encounter: Secondary | ICD-10-CM | POA: Diagnosis not present

## 2017-10-26 DIAGNOSIS — R1084 Generalized abdominal pain: Secondary | ICD-10-CM

## 2017-10-26 DIAGNOSIS — N390 Urinary tract infection, site not specified: Secondary | ICD-10-CM | POA: Insufficient documentation

## 2017-10-26 DIAGNOSIS — W19XXXA Unspecified fall, initial encounter: Secondary | ICD-10-CM

## 2017-10-26 HISTORY — DX: Chronic pain syndrome: G89.4

## 2017-10-26 HISTORY — DX: Disorder of kidney and ureter, unspecified: N28.9

## 2017-10-26 LAB — CBC WITH DIFFERENTIAL/PLATELET
BASOS ABS: 0 10*3/uL (ref 0.0–0.1)
Basophils Relative: 0 %
Eosinophils Absolute: 0.2 10*3/uL (ref 0.0–0.7)
Eosinophils Relative: 2 %
HEMATOCRIT: 42.6 % (ref 36.0–46.0)
HEMOGLOBIN: 13.2 g/dL (ref 12.0–15.0)
LYMPHS ABS: 2 10*3/uL (ref 0.7–4.0)
LYMPHS PCT: 22 %
MCH: 29.3 pg (ref 26.0–34.0)
MCHC: 31 g/dL (ref 30.0–36.0)
MCV: 94.7 fL (ref 78.0–100.0)
Monocytes Absolute: 0.5 10*3/uL (ref 0.1–1.0)
Monocytes Relative: 6 %
NEUTROS ABS: 6.6 10*3/uL (ref 1.7–7.7)
NEUTROS PCT: 70 %
PLATELETS: 379 10*3/uL (ref 150–400)
RBC: 4.5 MIL/uL (ref 3.87–5.11)
RDW: 12.9 % (ref 11.5–15.5)
WBC: 9.4 10*3/uL (ref 4.0–10.5)

## 2017-10-26 LAB — COMPREHENSIVE METABOLIC PANEL
ALK PHOS: 107 U/L (ref 38–126)
ALT: 40 U/L (ref 14–54)
ANION GAP: 10 (ref 5–15)
AST: 26 U/L (ref 15–41)
Albumin: 4.1 g/dL (ref 3.5–5.0)
BILIRUBIN TOTAL: 0.5 mg/dL (ref 0.3–1.2)
BUN: 56 mg/dL — AB (ref 6–20)
CALCIUM: 8.8 mg/dL — AB (ref 8.9–10.3)
CO2: 26 mmol/L (ref 22–32)
Chloride: 103 mmol/L (ref 101–111)
Creatinine, Ser: 1.98 mg/dL — ABNORMAL HIGH (ref 0.44–1.00)
GFR calc Af Amer: 33 mL/min — ABNORMAL LOW (ref >60)
GFR, EST NON AFRICAN AMERICAN: 28 mL/min — AB (ref >60)
GLUCOSE: 157 mg/dL — AB (ref 65–99)
POTASSIUM: 3.9 mmol/L (ref 3.5–5.1)
Sodium: 139 mmol/L (ref 135–145)
TOTAL PROTEIN: 7.4 g/dL (ref 6.5–8.1)

## 2017-10-26 LAB — URINALYSIS, ROUTINE W REFLEX MICROSCOPIC
Bilirubin Urine: NEGATIVE
Glucose, UA: NEGATIVE mg/dL
HGB URINE DIPSTICK: NEGATIVE
Ketones, ur: NEGATIVE mg/dL
Nitrite: NEGATIVE
PROTEIN: NEGATIVE mg/dL
SPECIFIC GRAVITY, URINE: 1.016 (ref 1.005–1.030)
WBC, UA: >50 WBC/hpf — ABNORMAL HIGH (ref 0–5)
pH: 5 (ref 5.0–8.0)

## 2017-10-26 LAB — LIPASE, BLOOD: Lipase: 41 U/L (ref 11–51)

## 2017-10-26 LAB — CARBAMAZEPINE LEVEL, TOTAL: Carbamazepine Lvl: 2.1 ug/mL — ABNORMAL LOW (ref 4.0–12.0)

## 2017-10-26 MED ORDER — DOXYCYCLINE HYCLATE 100 MG PO TABS
100.0000 mg | ORAL_TABLET | Freq: Once | ORAL | Status: AC
  Administered 2017-10-26: 100 mg via ORAL
  Filled 2017-10-26: qty 1

## 2017-10-26 MED ORDER — ONDANSETRON HCL 4 MG/2ML IJ SOLN
4.0000 mg | Freq: Once | INTRAMUSCULAR | Status: AC
  Administered 2017-10-26: 4 mg via INTRAVENOUS
  Filled 2017-10-26: qty 2

## 2017-10-26 MED ORDER — MORPHINE SULFATE (PF) 4 MG/ML IV SOLN: 4.0000 mg | Freq: Once | INTRAVENOUS | Status: DC

## 2017-10-26 MED ORDER — SODIUM CHLORIDE 0.9 % IV BOLUS
1000.0000 mL | Freq: Once | INTRAVENOUS | Status: AC
  Administered 2017-10-26: 1000 mL via INTRAVENOUS

## 2017-10-26 MED ORDER — GI COCKTAIL ~~LOC~~
ORAL | Status: AC
  Filled 2017-10-26: qty 30

## 2017-10-26 MED ORDER — MORPHINE SULFATE (PF) 2 MG/ML IV SOLN
INTRAVENOUS | Status: AC
  Administered 2017-10-26: 4 mg via INTRAVENOUS
  Filled 2017-10-26: qty 2

## 2017-10-26 MED ORDER — MORPHINE SULFATE (PF) 4 MG/ML IV SOLN
4.0000 mg | Freq: Once | INTRAVENOUS | Status: AC
  Administered 2017-10-26: 4 mg via INTRAVENOUS
  Filled 2017-10-26: qty 1

## 2017-10-26 MED ORDER — GI COCKTAIL ~~LOC~~
30.0000 mL | Freq: Once | ORAL | Status: AC
  Administered 2017-10-26: 30 mL via ORAL

## 2017-10-26 MED ORDER — DOXYCYCLINE HYCLATE 100 MG PO CAPS: 100.0000 mg | ORAL_CAPSULE | Freq: Two times a day (BID) | ORAL | 0 refills | Status: DC

## 2017-10-26 NOTE — ED Triage Notes (Addendum)
Pt reports she is having a reaction to a new medication. Pt reports trembling, weakness and has fallen twice due to being unstable. Pt started equetro last Thursday and her last dose was last night. Med is for bipolar. Pt is also complaining of abdominal pain and having difficulty getting a deep breath

## 2017-10-26 NOTE — ED Provider Notes (Signed)
[facility]ERGENCY DEPARTMENT Provider Note   CSN: [id number] Arrival date & time: 10/26/17  1420     History   Chief Complaint Chief Complaint  Patient presents with  . Allergic Reaction  . Abdominal Pain    HPI Molly Berry is a 52 y.o. female.  Was recently tapered off 1 of her psychiatric medications and started on Equetro which is carbamazepine on Thursday.  For the past few days she is felt increasingly slow in her thinking weak all over causing her to fall twice.  She also feels she is more tremulous and has been complaining also some crampy low abdominal pain and nausea.  She was out in the heat yesterday and thinks she may be did not drink enough fluids.  She talked to her psychiatrist Dr.Kaur about the symptoms and was recommended that she come to the emergency department for evaluation.  She is complaining of worsening of her chronic back pain from the falls.  There is been no fever no chest pain.  The history is provided by the patient.  Allergic Reaction  Presenting symptoms: no rash   Presenting symptoms comment:  Weakness, nausea, falls Severity:  Severe Duration:  2 days Prior allergic episodes:  No prior episodes Context: medications   Relieved by:  Nothing Worsened by:  Nothing Ineffective treatments:  None tried Abdominal Pain   This is a new problem. The current episode started yesterday. The problem has been gradually improving. The pain is associated with an unknown factor. The pain is located in the generalized abdominal region. The pain is moderate. Associated symptoms include nausea. Pertinent negatives include fever, dysuria and frequency. Nothing relieves the symptoms.    Past Medical History:  Diagnosis Date  . Allergy   . Anemia   . Anxiety   . Arthritis    knees, multiple joints  . Asthma   . Bell palsy   . Blood transfusion without reported diagnosis   . Breast mass    lt breast mass x's 3 years increased in size  . Chronic kidney  disease   . Chronic pain syndrome   . COPD (chronic obstructive pulmonary disease) ([place])   . Depression   . Diabetes mellitus   . Endometrial cancer ([place])   . Fibromyalgia   . GERD (gastroesophageal reflux disease)   . 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 Hyperlipidemia   . Hypertension   . Hypothyroidism   . Multiple pulmonary nodules 11/30/2012   Followed in Pulmonary clinic/ [facility]/ [place]  - See CT abd  10/29/12  New right lower lobe pulmonary nodularity, primarily ground-  glass in density. This could reflect an inflammatory process,  although follow-up is necessary to exclude atypical neoplasm. Full  chest CT should be considered to evaluate for other pulmonary  findings.     . Multiple thyroid nodules   . Nephropathy   . PONV (postoperative nausea and vomiting)   . Pulmonary nodules   . Thyroid disease   . Ulcer   . Uterine cancer ([place]) 12/26/2011   Stage 1, grade 1, S/P hysterectomy initially by robotic technique and then salpingo[facility]oophorectomy at [facility] receiving no postoperative treatment.    Patient Active Problem List   Diagnosis Date Noted  . Bee sting allergy 07/07/2016  . Attention deficit disorder (ADD) in adult 07/07/2016  . Chronic insomnia 07/07/2016  . Vitamin D deficiency 07/07/2016  . Migraine syndrome 07/07/2016  . Osteoarthritis of both knees 07/07/2016  . Fibromyalgia  syndrome 07/07/2016  . S/P gastric bypass 07/07/2016  . HLD (hyperlipidemia) 07/07/2016  . Morbid obesity due to excess calories ([place]) 03/10/2015  . Diabetes mellitus without complication ([place]) 01/75/1025  . Nontoxic multinodular goiter 03/09/2015  . Essential hypertension, benign 03/09/2015  . 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 IDA (iron deficiency anemia) 12/27/2013  . Ureteral calculus, left 11/17/2013  . SOB (shortness of breath) 11/30/2012    Past Surgical History:  Procedure Laterality Date  . ABDOMINAL HYSTERECTOMY     endometrial cancer  . CHOLECYSTECTOMY    .  COLONOSCOPY WITH PROPOFOL N/A 01/19/2014   ENI:DPOEUMPNTIR  . DILATION AND CURETTAGE OF UTERUS  12 yrs ago  . DILATION AND CURETTAGE OF UTERUS  06/18/2011   Procedure: DILATATION AND CURETTAGE;  Surgeon: Florian Buff, MD;  Location: [facility];  Service: Gynecology;  Laterality: N/A;  Suction Dilation and Curettage  . ESOPHAGOGASTRODUODENOSCOPY N/A 10/12/2013   Dr. Rourk:anastomotic ulcer likely cause of bleeding. likely ischemic   . ESOPHAGOGASTRODUODENOSCOPY (EGD) WITH PROPOFOL N/A 01/19/2014   WER:XVQMGQ  . GASTRIC BYPASS  2013   [facility]  . HYSTEROSCOPY W/D&C  06/18/2011   Procedure: DILATATION AND CURETTAGE /HYSTEROSCOPY;  Surgeon: Florian Buff, MD;  Location: [facility];  Service: Gynecology;  Laterality: N/A;  . LITHOTRIPSY    . paniculectomy    . TRIGGER FINGER RELEASE       OB History   None      Home Medications    Prior to Admission medications   Medication Sig Start Date End Date Taking? Authorizing Provider  albuterol (PROVENTIL HFA;VENTOLIN HFA) 108 (90 BASE) MCG/ACT inhaler Inhale 2 puffs into the lungs every 6 (six) hours as needed. For asthma     [provider]  ALPRAZolam Duanne Moron) 1 MG tablet Take 1.5 mg by mouth at bedtime as needed. FOR ANXIETY AND SLEEP    [provider]  amphetamine-dextroamphetamine (ADDERALL XR) 30 MG 24 hr capsule Take 60 mg by mouth daily.     [provider]  canagliflozin (INVOKANA) 100 MG TABS tablet Take by mouth daily before breakfast.    [provider]  clindamycin (CLEOCIN) 300 MG capsule Take 1 capsule (300 mg total) by mouth 3 (three) times daily. 01/22/17   Rancour, Annie Main, MD  diphenhydrAMINE (BENADRYL) 25 mg capsule Take 25-75 mg by mouth See admin instructions. Taking 1 Capsule daily as needed for allergic reaction and takes 3 capsules at bedtime    [provider]  furosemide (LASIX) 20 MG tablet Take 20 mg by mouth daily as needed for fluid.  02/15/14   [provider]  gabapentin  (NEURONTIN) 400 MG capsule Take 400 mg by mouth 3 (three) times daily. 800 mg in am, 400 mg twice daily.    [provider]  HYDROcodone-acetaminophen (NORCO/VICODIN) 5-325 MG tablet 1 or 2 tabs PO q6 hours prn pain 12/28/16   Francine Graven, DO  ibuprofen (ADVIL,MOTRIN) 200 MG tablet Take 600 mg by mouth every 6 (six) hours as needed for headache or moderate pain.    [provider]  lamoTRIgine (LAMICTAL) 150 MG tablet Take 150 mg by mouth 2 (two) times daily.    [provider]  lisinopril (PRINIVIL,ZESTRIL) 40 MG tablet Take 40 mg by mouth daily as needed. Take when blood pressure "spikes" per pt 03/27/14   [provider]  meloxicam (MOBIC) 15 MG tablet Take 1 tablet by mouth daily. 12/25/16   [provider]  methocarbamol (ROBAXIN) 500 MG tablet Take  2 tablets (1,000 mg total) by mouth 4 (four) times daily as needed for muscle spasms (muscle spasm/pain). 12/28/16   Francine Graven, DO  metoprolol succinate (TOPROL-XL) 50 MG 24 hr tablet Take 1 tablet by mouth daily. 12/10/16   [provider]  naproxen (NAPROSYN) 500 MG tablet Take 1 tablet (500 mg total) by mouth 2 (two) times daily. 01/22/17   Rancour, Annie Main, MD  ondansetron (ZOFRAN) 8 MG tablet Take 8 mg by mouth every 8 (eight) hours as needed for nausea or vomiting.  11/24/13   [provider]  pantoprazole (PROTONIX) 40 MG tablet TAKE (1) TABLET TWICE DAILY. 04/01/16   Annitta Needs, NP  potassium chloride (K-DUR) 10 MEQ tablet Take 1 tablet by mouth daily. 12/11/16   [provider]  simvastatin (ZOCOR) 20 MG tablet Take 20 mg by mouth daily.  05/29/16   [provider]  SUMAtriptan (IMITREX) 100 MG tablet Take 1 tablet by mouth daily as needed. 07/12/16   [provider]  tiZANidine (ZANAFLEX) 4 MG tablet Take 4 mg by mouth at bedtime. For spasms.    [provider]  vortioxetine HBr (TRINTELLIX) 20 MG TABS Take 20 mg by mouth daily.      [provider]    Family History Family History  Problem Relation Age of Onset  . Pneumonia Mother        Deceased  . Arthritis Mother   . Asthma Mother   . Cancer Mother        pancreatic  . COPD Mother   . Depression Mother   . Diabetes Mother   . Kidney disease Mother   . Liver cancer Father        Living  . Arthritis Father   . Cancer Father        liver, prostate  . COPD Father   . Depression Father   . Stroke Father   . Heart disease Maternal Grandmother   . Mental illness Maternal Grandmother   . Breast cancer Maternal Grandmother   . Alcohol abuse Paternal Grandfather   . Breast cancer Paternal Aunt   . Anesthesia problems Neg Hx   . Hypotension Neg Hx   . Malignant hyperthermia Neg Hx   . Pseudochol deficiency Neg Hx   . Colon cancer Neg Hx     Social History Social History   Tobacco Use  . Smoking status: Never Smoker  . Smokeless tobacco: Never Used  Substance Use Topics  . Alcohol use: No    Alcohol/week: 0.0 oz  . Drug use: No     Allergies   Bee venom; Cephalexin; Vancomycin; Adhesive [tape]; Cefotaxime; Ciprofloxacin; Clindamycin; Latex; Penicillins; and Sulfa antibiotics   Review of Systems Review of Systems  Constitutional: Negative for chills and fever.  HENT: Negative for rhinorrhea and sore throat.   Eyes: Negative for pain and visual disturbance.  Respiratory: Negative for shortness of breath.   Cardiovascular: Negative for chest pain.  Gastrointestinal: Positive for abdominal pain and nausea.  Genitourinary: Negative for dysuria and frequency.  Musculoskeletal: Positive for back pain. Negative for neck pain.  Skin: Negative for rash.  Neurological: Positive for weakness and light-headedness. Negative for syncope.     Physical Exam Updated Vital Signs BP 92/62 (BP Location: Right Arm)   Pulse 83   Temp 97.9 F (36.6 C) (Oral)   Resp 18   Ht 5\' 4"  (1.626 m)   Wt 109.3 kg (241 lb)   LMP 05/06/2011   SpO2  98%    BMI 41.37 kg/m   Physical Exam  Constitutional: She is oriented to person, place, and time. She appears well-developed and well-nourished. No distress.  HENT:  Head: Normocephalic and atraumatic.  Mouth/Throat: Oropharynx is clear and moist.  Eyes: Pupils are equal, round, and reactive to light. Conjunctivae and EOM are normal.  Neck: Neck supple.  Cardiovascular: Normal rate and regular rhythm.  No murmur heard. Pulmonary/Chest: Effort normal and breath sounds normal. No respiratory distress.  Abdominal: Soft. Bowel sounds are normal. There is no tenderness. There is no rigidity, no rebound, no guarding and negative Murphy's sign.  Musculoskeletal: She exhibits no edema, tenderness or deformity.  Neurological: She is alert and oriented to person, place, and time. She has normal strength. No cranial nerve deficit or sensory deficit. GCS eye subscore is 4. GCS verbal subscore is 5. GCS motor subscore is 6.  Skin: Skin is warm and dry. Capillary refill takes less than 2 seconds.  Psychiatric: She has a normal mood and affect.  Nursing note and vitals reviewed.    ED Treatments / Results  Labs (all labs ordered are listed, but only abnormal results are displayed) Labs Reviewed  COMPREHENSIVE METABOLIC PANEL - Abnormal; Notable for the following components:      Result Value   Glucose, Bld 157 (*)    BUN 56 (*)    Creatinine, Ser 1.98 (*)    Calcium 8.8 (*)    GFR calc non Af Amer 28 (*)    GFR calc Af Amer 33 (*)    All other components within normal limits  LIPASE, BLOOD  CBC WITH DIFFERENTIAL/PLATELET  URINALYSIS, ROUTINE W REFLEX MICROSCOPIC  CARBAMAZEPINE LEVEL, TOTAL    EKG EKG Interpretation  Date/Time:  Monday October 26 2017 20:56:55 EDT Ventricular Rate:  62 PR Interval:    QRS Duration: 111 QT Interval:  432 QTC Calculation: 439 R Axis:   -12 Text Interpretation:  Sinus rhythm Low voltage, precordial leads RSR' in V1 or V2, right VCD or RVH Borderline T  abnormalities, anterior leads similar to prior 3/19 Confirmed by Aletta Edouard [phone]) on 10/26/2017 9:05:16 PM   Radiology Ct Renal Stone Study  Result Date: 10/26/2017 CLINICAL DATA:  RIGHT-sided flank pain.  Denies hematuria. EXAM: CT ABDOMEN AND PELVIS WITHOUT CONTRAST TECHNIQUE: Multidetector CT imaging of the abdomen and pelvis was performed following the standard protocol without IV contrast. COMPARISON:  12/28/2016. FINDINGS: Lower chest: No acute abnormality. Hepatobiliary: Hepatic steatosis. Cholecystectomy. No biliary ductal dilatation. Pancreas: Unremarkable. No pancreatic ductal dilatation or surrounding inflammatory changes. Spleen: Normal in size without focal abnormality. Adrenals/Urinary Tract: No hydronephrosis. BILATERAL nephrolithiasis. 9 mm LEFT UPJ stone nonobstructive. Unremarkable bladder. Stomach/Bowel: Postsurgical change of the stomach and small bowel, uncertain significance. No bowel obstruction. No evidence of bowel wall thickening, distention, or inflammatory changes. Normal appendix. Vascular/Lymphatic: No significant vascular findings are present. No enlarged abdominal or pelvic lymph nodes. Reproductive: Status post hysterectomy. No adnexal masses. Other: Abdominal wall scarring, but no significant hernia. Musculoskeletal: No acute or significant osseous findings. IMPRESSION: BILATERAL nephrolithiasis similar to priors, without hydronephrosis or hydroureter. No acute abnormalities are seen which might explain RIGHT flank pain. Gallbladder surgically absent.  Normal appearing appendix. Electronically Signed   By: Staci Righter M.D.   On: 10/26/2017 20:29    Procedures Procedures (including critical care time)  Medications Ordered in ED Medications  sodium chloride 0.9 % bolus 1,000 mL (has no administration in time range)  ondansetron (ZOFRAN) injection 4 mg (has  no administration in time range)  morphine 4 MG/ML injection 4 mg (has no administration in time range)      Initial Impression / Assessment and Plan / ED Course  I have reviewed the triage vital signs and the nursing notes.  Pertinent labs & imaging results that were available during my care of the patient were reviewed by me and considered in my medical decision making (see chart for details).  Clinical Course as of Oct 29 1011  Mon Oct 26, 2017  1727 Patient is complaining of various new symptoms since she started on a new medication that is carbamazepine for her bipolar.  He states she is been taking the medication as prescribed she was weaned off her other medication.  Currently she is complaining of feeling weak all over her husband a couple falls feeling more tremulous more slow in her thinking and some crampy abdominal pain with some nausea.  On review of this medication it definitely could account for her weakness ataxia and somnolence.  Her labs are starting to come back down she is got a new AKI with a creatinine of 1.98.  I have ordered some IV fluids and will reevaluate.   [MB]  2045 Reviewed the results with the patient.  She is still feeling shaking her legs but improved and is been able to ambulate.  She states she usually gets put on doxycycline for her UTI symptoms.  She has a follow-up appointment with the psychiatrist on Thursday and she is comfortable going home and will return if any worsening symptoms.   [MB]    Clinical Course User Index [MB] Hayden Rasmussen, MD    Final Clinical Impressions(s) / ED Diagnoses   Final diagnoses:  Unsteady gait  Fall, initial encounter  Medication side effect, initial encounter  Generalized abdominal pain  Lower urinary tract infectious disease    ED Discharge Orders        Ordered    doxycycline (VIBRAMYCIN) 100 MG capsule  2 times daily     10/26/17 2054       Hayden Rasmussen, MD 10/28/17 1015

## 2017-10-26 NOTE — Discharge Instructions (Signed)
You were evaluated in the emergency department for being very unsteady on your feet with 2 falls.  He also felt very slow in your thinking.  This is likely related to the new medication he started and it is important that you do not take it anymore.  We also found you to have a possible urinary infection and we are prescribing some antibiotics.  Please follow-up with your primary care doctor and your psychiatrist for further evaluation.  Please return to the emergency department if any worsening symptoms.

## 2017-11-19 ENCOUNTER — Encounter: Payer: Self-pay | Admitting: "Endocrinology

## 2017-12-31 ENCOUNTER — Ambulatory Visit: Payer: BLUE CROSS/BLUE SHIELD | Admitting: "Endocrinology

## 2018-01-05 ENCOUNTER — Other Ambulatory Visit: Payer: Self-pay | Admitting: Urology

## 2018-01-11 ENCOUNTER — Other Ambulatory Visit: Payer: Self-pay

## 2018-01-11 ENCOUNTER — Encounter (HOSPITAL_COMMUNITY)
Admission: RE | Disposition: A | Discharge status: Home / Self Care | Payer: Self-pay | Source: Ambulatory Visit | Attending: Urology

## 2018-01-11 ENCOUNTER — Ambulatory Visit (HOSPITAL_COMMUNITY)
Admission: RE | Admit: 2018-01-11 | Discharge: 2018-01-11 | Disposition: A | Discharge status: Home / Self Care | Payer: BLUE CROSS/BLUE SHIELD | Source: Ambulatory Visit | Attending: Urology | Admitting: Urology

## 2018-01-11 ENCOUNTER — Encounter (HOSPITAL_COMMUNITY): Payer: Self-pay | Admitting: *Deleted

## 2018-01-11 ENCOUNTER — Encounter (HOSPITAL_COMMUNITY): Payer: Self-pay | Admitting: Anesthesiology

## 2018-01-11 ENCOUNTER — Ambulatory Visit (HOSPITAL_COMMUNITY): Payer: BLUE CROSS/BLUE SHIELD

## 2018-01-11 DIAGNOSIS — Z79899 Other long term (current) drug therapy: Secondary | ICD-10-CM | POA: Insufficient documentation

## 2018-01-11 DIAGNOSIS — F419 Anxiety disorder, unspecified: Secondary | ICD-10-CM | POA: Insufficient documentation

## 2018-01-11 DIAGNOSIS — Z538 Procedure and treatment not carried out for other reasons: Secondary | ICD-10-CM | POA: Insufficient documentation

## 2018-01-11 DIAGNOSIS — E119 Type 2 diabetes mellitus without complications: Secondary | ICD-10-CM | POA: Diagnosis not present

## 2018-01-11 DIAGNOSIS — M797 Fibromyalgia: Secondary | ICD-10-CM | POA: Diagnosis not present

## 2018-01-11 DIAGNOSIS — K219 Gastro-esophageal reflux disease without esophagitis: Secondary | ICD-10-CM | POA: Insufficient documentation

## 2018-01-11 DIAGNOSIS — F329 Major depressive disorder, single episode, unspecified: Secondary | ICD-10-CM | POA: Diagnosis not present

## 2018-01-11 DIAGNOSIS — Z841 Family history of disorders of kidney and ureter: Secondary | ICD-10-CM | POA: Insufficient documentation

## 2018-01-11 DIAGNOSIS — N3000 Acute cystitis without hematuria: Secondary | ICD-10-CM | POA: Insufficient documentation

## 2018-01-11 DIAGNOSIS — J45909 Unspecified asthma, uncomplicated: Secondary | ICD-10-CM | POA: Insufficient documentation

## 2018-01-11 DIAGNOSIS — Z7984 Long term (current) use of oral hypoglycemic drugs: Secondary | ICD-10-CM | POA: Insufficient documentation

## 2018-01-11 DIAGNOSIS — E7800 Pure hypercholesterolemia, unspecified: Secondary | ICD-10-CM | POA: Diagnosis not present

## 2018-01-11 DIAGNOSIS — Z9884 Bariatric surgery status: Secondary | ICD-10-CM | POA: Insufficient documentation

## 2018-01-11 DIAGNOSIS — N200 Calculus of kidney: Secondary | ICD-10-CM | POA: Diagnosis not present

## 2018-01-11 DIAGNOSIS — Z87442 Personal history of urinary calculi: Secondary | ICD-10-CM | POA: Insufficient documentation

## 2018-01-11 HISTORY — PX: EXTRACORPOREAL SHOCK WAVE LITHOTRIPSY: SHX1557

## 2018-01-11 LAB — GLUCOSE, CAPILLARY: GLUCOSE-CAPILLARY: 100 mg/dL — AB (ref 70–99)

## 2018-01-11 SURGERY — LITHOTRIPSY, ESWL
Anesthesia: LOCAL | Laterality: Left

## 2018-01-11 MED ORDER — DIPHENHYDRAMINE HCL 25 MG PO CAPS
25.0000 mg | ORAL_CAPSULE | ORAL | Status: AC
  Administered 2018-01-11: 25 mg via ORAL
  Filled 2018-01-11: qty 1

## 2018-01-11 MED ORDER — GENTAMICIN SULFATE 40 MG/ML IJ SOLN
560.0000 mg | INTRAVENOUS | Status: DC
  Filled 2018-01-11: qty 14

## 2018-01-11 MED ORDER — SODIUM CHLORIDE 0.9 % IV SOLN
INTRAVENOUS | Status: DC
  Administered 2018-01-11: 11:00:00 via INTRAVENOUS

## 2018-01-11 MED ORDER — DIAZEPAM 5 MG PO TABS
10.0000 mg | ORAL_TABLET | ORAL | Status: AC
  Administered 2018-01-11: 10 mg via ORAL
  Filled 2018-01-11: qty 2

## 2018-01-11 MED ORDER — MORPHINE SULFATE (PF) 4 MG/ML IV SOLN
4.0000 mg | Freq: Once | INTRAVENOUS | Status: AC
  Administered 2018-01-11: 4 mg via INTRAVENOUS

## 2018-01-11 MED ORDER — MORPHINE SULFATE (PF) 4 MG/ML IV SOLN
INTRAVENOUS | Status: AC
  Filled 2018-01-11: qty 1

## 2018-01-11 NOTE — H&P (Signed)
See scanned H&P

## 2018-01-11 NOTE — Anesthesia Preprocedure Evaluation (Deleted)

## 2018-01-11 NOTE — Progress Notes (Signed)
Pt's procedure cancelled. Apparently her situation requires MAC anesthesia with a CRNA at bedside and this was not scheduled. [facility], spoke with Jenny Reichmann, triage RN, who sent a note to Dr. Diona Fanti, pt's primary urologist. The note requested that Dr. Diona Fanti assist in managing pt's pain until her procedure can be rescheduled. Pt states her pain management doctor requested this and that Dr. Diona Fanti can call him to coordinate pain management care while awaiting scheduling.  Also left a message for [facility], [facility] scheduler, to ensure the next procedure is scheduled correctly. Pt and husband updated.  Coolidge Breeze, RN 01/11/2018

## 2018-01-11 NOTE — Discharge Instructions (Signed)
Your procedure has been cancelled for today. We will reschedule the procedure for you.

## 2018-01-12 ENCOUNTER — Other Ambulatory Visit: Payer: Self-pay | Admitting: Urology

## 2018-01-12 NOTE — Progress Notes (Signed)
Please place orders in epic for lithotripsy procedure scheduled for Thursday 01/14/2018. Thanks

## 2018-01-13 MED ORDER — GENTAMICIN SULFATE 40 MG/ML IJ SOLN
400.0000 mg | INTRAVENOUS | Status: AC
  Administered 2018-01-14: 400 mg via INTRAVENOUS
  Filled 2018-01-13: qty 10

## 2018-01-14 ENCOUNTER — Ambulatory Visit (HOSPITAL_COMMUNITY): Payer: BLUE CROSS/BLUE SHIELD | Admitting: Anesthesiology

## 2018-01-14 ENCOUNTER — Ambulatory Visit (HOSPITAL_COMMUNITY)
Admission: RE | Admit: 2018-01-14 | Discharge: 2018-01-14 | Disposition: A | Discharge status: Home / Self Care | Payer: BLUE CROSS/BLUE SHIELD | Source: Ambulatory Visit | Attending: Urology | Admitting: Urology

## 2018-01-14 ENCOUNTER — Ambulatory Visit (HOSPITAL_COMMUNITY): Payer: BLUE CROSS/BLUE SHIELD

## 2018-01-14 ENCOUNTER — Encounter (HOSPITAL_COMMUNITY): Payer: Self-pay | Admitting: General Practice

## 2018-01-14 ENCOUNTER — Encounter (HOSPITAL_COMMUNITY)
Admission: RE | Disposition: A | Discharge status: Home / Self Care | Payer: Self-pay | Source: Ambulatory Visit | Attending: Urology

## 2018-01-14 DIAGNOSIS — I10 Essential (primary) hypertension: Secondary | ICD-10-CM | POA: Insufficient documentation

## 2018-01-14 DIAGNOSIS — Z91040 Latex allergy status: Secondary | ICD-10-CM | POA: Insufficient documentation

## 2018-01-14 DIAGNOSIS — Z881 Allergy status to other antibiotic agents status: Secondary | ICD-10-CM | POA: Insufficient documentation

## 2018-01-14 DIAGNOSIS — Z87891 Personal history of nicotine dependence: Secondary | ICD-10-CM | POA: Diagnosis not present

## 2018-01-14 DIAGNOSIS — Z6841 Body Mass Index (BMI) 40.0 and over, adult: Secondary | ICD-10-CM | POA: Diagnosis not present

## 2018-01-14 DIAGNOSIS — Z79899 Other long term (current) drug therapy: Secondary | ICD-10-CM | POA: Insufficient documentation

## 2018-01-14 DIAGNOSIS — N200 Calculus of kidney: Secondary | ICD-10-CM

## 2018-01-14 DIAGNOSIS — E119 Type 2 diabetes mellitus without complications: Secondary | ICD-10-CM | POA: Diagnosis not present

## 2018-01-14 DIAGNOSIS — Z9884 Bariatric surgery status: Secondary | ICD-10-CM | POA: Diagnosis not present

## 2018-01-14 DIAGNOSIS — Z7984 Long term (current) use of oral hypoglycemic drugs: Secondary | ICD-10-CM | POA: Insufficient documentation

## 2018-01-14 DIAGNOSIS — M797 Fibromyalgia: Secondary | ICD-10-CM | POA: Diagnosis not present

## 2018-01-14 DIAGNOSIS — J449 Chronic obstructive pulmonary disease, unspecified: Secondary | ICD-10-CM | POA: Insufficient documentation

## 2018-01-14 HISTORY — PX: EXTRACORPOREAL SHOCK WAVE LITHOTRIPSY: SHX1557

## 2018-01-14 LAB — GLUCOSE, CAPILLARY: GLUCOSE-CAPILLARY: 75 mg/dL (ref 70–99)

## 2018-01-14 SURGERY — LITHOTRIPSY, ESWL
Anesthesia: Monitor Anesthesia Care | Laterality: Left

## 2018-01-14 MED ORDER — MIDAZOLAM HCL 2 MG/2ML IJ SOLN
INTRAMUSCULAR | Status: AC
  Filled 2018-01-14: qty 2

## 2018-01-14 MED ORDER — OXYCODONE-ACETAMINOPHEN 5-325 MG PO TABS
ORAL_TABLET | ORAL | Status: AC
  Filled 2018-01-14: qty 1

## 2018-01-14 MED ORDER — DIPHENHYDRAMINE HCL 25 MG PO CAPS: 25.0000 mg | ORAL_CAPSULE | ORAL | Status: DC

## 2018-01-14 MED ORDER — FENTANYL CITRATE (PF) 100 MCG/2ML IJ SOLN
INTRAMUSCULAR | Status: AC
  Filled 2018-01-14: qty 4

## 2018-01-14 MED ORDER — SODIUM CHLORIDE 0.9 % IV SOLN
INTRAVENOUS | Status: DC
  Administered 2018-01-14: 14:00:00 via INTRAVENOUS

## 2018-01-14 MED ORDER — OXYCODONE HCL 5 MG PO TABS
5.0000 mg | ORAL_TABLET | Freq: Once | ORAL | Status: AC
  Administered 2018-01-14: 5 mg via ORAL

## 2018-01-14 MED ORDER — DIAZEPAM 5 MG PO TABS: 10.0000 mg | ORAL_TABLET | ORAL | Status: DC

## 2018-01-14 MED ORDER — OXYCODONE HCL 5 MG PO TABS
ORAL_TABLET | ORAL | Status: AC
  Filled 2018-01-14: qty 1

## 2018-01-14 MED ORDER — PROPOFOL 10 MG/ML IV BOLUS
INTRAVENOUS | Status: AC
  Filled 2018-01-14: qty 100

## 2018-01-14 MED ORDER — OXYCODONE-ACETAMINOPHEN 5-325 MG PO TABS
1.0000 | ORAL_TABLET | Freq: Once | ORAL | Status: AC
  Administered 2018-01-14: 1 via ORAL

## 2018-01-14 NOTE — Anesthesia Preprocedure Evaluation (Signed)
Anesthesia Evaluation  Patient identified by MRN, date of birth, ID band Patient awake    Reviewed: Allergy & Precautions, NPO status , Patient's Chart, lab work & pertinent test results  History of Anesthesia Complications (+) PONV  Airway Mallampati: II  TM Distance: >3 FB Neck ROM: Full    Dental no notable dental hx. (+) Poor Dentition, Missing   Pulmonary asthma , COPD,    Pulmonary exam normal breath sounds clear to auscultation       Cardiovascular hypertension, Pt. on medications Normal cardiovascular exam Rhythm:Regular Rate:Normal     Neuro/Psych negative neurological ROS  negative psych ROS   GI/Hepatic negative GI ROS, Neg liver ROS,   Endo/Other  diabetes, Type 2Morbid obesity  Renal/GU negative Renal ROS  negative genitourinary   Musculoskeletal  (+) Fibromyalgia -  Abdominal   Peds negative pediatric ROS (+)  Hematology negative hematology ROS (+)   Anesthesia Other Findings   Reproductive/Obstetrics negative OB ROS                             Anesthesia Physical Anesthesia Plan  ASA: III  Anesthesia Plan: MAC   Post-op Pain Management:    Induction: Intravenous  PONV Risk Score and Plan: 3 and Ondansetron and Treatment may vary due to age or medical condition  Airway Management Planned: Nasal Cannula  Additional Equipment:   Intra-op Plan:   Post-operative Plan:   Informed Consent: I have reviewed the patients History and Physical, chart, labs and discussed the procedure including the risks, benefits and alternatives for the proposed anesthesia with the patient or authorized representative who has indicated his/her understanding and acceptance.   Dental advisory given  Plan Discussed with: CRNA  Anesthesia Plan Comments:         Anesthesia Quick Evaluation

## 2018-01-14 NOTE — Transfer of Care (Signed)
Immediate Anesthesia Transfer of Care Note  Patient: Molly Berry  Procedure(s) Performed: LEFT EXTRACORPOREAL SHOCK WAVE LITHOTRIPSY (ESWL) (Left )  Patient Location: PACU  Anesthesia Type:MAC  Level of Consciousness: awake, alert  and oriented  Airway & Oxygen Therapy: Patient Spontanous Breathing  Post-op Assessment: Report given to RN and Post -op Vital signs reviewed and stable  Post vital signs: Reviewed and stable  Last Vitals:  Vitals Value Taken Time  BP    Temp    Pulse    Resp    SpO2      Last Pain:  Vitals:   01/14/18 1322  TempSrc: Oral  PainSc: 5          Complications: No apparent anesthesia complications

## 2018-01-25 ENCOUNTER — Encounter (HOSPITAL_COMMUNITY): Payer: Self-pay | Admitting: Urology

## 2018-02-04 ENCOUNTER — Other Ambulatory Visit: Payer: Self-pay | Admitting: Physician Assistant

## 2018-02-04 DIAGNOSIS — Z1231 Encounter for screening mammogram for malignant neoplasm of breast: Secondary | ICD-10-CM

## 2018-03-09 ENCOUNTER — Ambulatory Visit
Admission: RE | Admit: 2018-03-09 | Discharge: 2018-03-09 | Disposition: A | Discharge status: Home / Self Care | Payer: BLUE CROSS/BLUE SHIELD | Source: Ambulatory Visit | Attending: Physician Assistant | Admitting: Physician Assistant

## 2018-03-09 DIAGNOSIS — Z1231 Encounter for screening mammogram for malignant neoplasm of breast: Secondary | ICD-10-CM

## 2018-03-30 ENCOUNTER — Other Ambulatory Visit (HOSPITAL_COMMUNITY)
Admission: RE | Admit: 2018-03-30 | Discharge: 2018-03-30 | Disposition: A | Discharge status: Home / Self Care | Payer: BLUE CROSS/BLUE SHIELD | Source: Ambulatory Visit | Attending: Urology | Admitting: Urology

## 2018-03-30 ENCOUNTER — Ambulatory Visit: Payer: BLUE CROSS/BLUE SHIELD | Admitting: Urology

## 2018-03-30 DIAGNOSIS — N3000 Acute cystitis without hematuria: Secondary | ICD-10-CM | POA: Insufficient documentation

## 2018-03-30 DIAGNOSIS — N200 Calculus of kidney: Secondary | ICD-10-CM

## 2018-04-02 LAB — URINE CULTURE

## 2018-04-09 ENCOUNTER — Other Ambulatory Visit: Payer: Self-pay | Admitting: Urology

## 2018-04-09 DIAGNOSIS — N200 Calculus of kidney: Secondary | ICD-10-CM

## 2018-04-14 ENCOUNTER — Ambulatory Visit (HOSPITAL_COMMUNITY): Admission: RE | Admit: 2018-04-14 | Payer: BLUE CROSS/BLUE SHIELD | Source: Ambulatory Visit

## 2018-04-14 ENCOUNTER — Encounter (HOSPITAL_COMMUNITY): Payer: Self-pay

## 2018-06-08 ENCOUNTER — Ambulatory Visit: Payer: BLUE CROSS/BLUE SHIELD | Admitting: Urology

## 2018-07-27 ENCOUNTER — Ambulatory Visit: Payer: BLUE CROSS/BLUE SHIELD | Admitting: Urology

## 2018-10-27 DIAGNOSIS — E1169 Type 2 diabetes mellitus with other specified complication: Secondary | ICD-10-CM | POA: Insufficient documentation

## 2018-10-27 DIAGNOSIS — K219 Gastro-esophageal reflux disease without esophagitis: Secondary | ICD-10-CM | POA: Insufficient documentation

## 2019-09-09 ENCOUNTER — Other Ambulatory Visit: Payer: Self-pay

## 2020-03-02 ENCOUNTER — Ambulatory Visit
Admission: EM | Admit: 2020-03-02 | Discharge: 2020-03-02 | Disposition: A | Discharge status: Home / Self Care | Payer: BLUE CROSS/BLUE SHIELD | Attending: Emergency Medicine | Admitting: Emergency Medicine

## 2020-03-02 ENCOUNTER — Encounter: Payer: Self-pay | Admitting: Emergency Medicine

## 2020-03-02 ENCOUNTER — Other Ambulatory Visit: Payer: Self-pay

## 2020-03-02 DIAGNOSIS — J019 Acute sinusitis, unspecified: Secondary | ICD-10-CM

## 2020-03-02 DIAGNOSIS — R059 Cough, unspecified: Secondary | ICD-10-CM | POA: Diagnosis not present

## 2020-03-02 DIAGNOSIS — J4521 Mild intermittent asthma with (acute) exacerbation: Secondary | ICD-10-CM

## 2020-03-02 MED ORDER — PREDNISONE 10 MG (21) PO TBPK: ORAL_TABLET | Freq: Every day | ORAL | 0 refills | Status: DC

## 2020-03-02 MED ORDER — DEXAMETHASONE SODIUM PHOSPHATE 10 MG/ML IJ SOLN
10.0000 mg | Freq: Once | INTRAMUSCULAR | Status: AC
  Administered 2020-03-02: 10 mg via INTRAMUSCULAR

## 2020-03-02 MED ORDER — DOXYCYCLINE HYCLATE 100 MG PO CAPS: 100.0000 mg | ORAL_CAPSULE | Freq: Two times a day (BID) | ORAL | 0 refills | Status: DC

## 2020-03-02 MED ORDER — BENZONATATE 100 MG PO CAPS: 100.0000 mg | ORAL_CAPSULE | Freq: Three times a day (TID) | ORAL | 0 refills | Status: DC

## 2020-03-02 NOTE — ED Provider Notes (Signed)
[facility]   [id number] 03/02/20 Arrival Time: 4627   CC: COVID symptoms  SUBJECTIVE: History from: patient.  Molly Berry is a 54 y.o. female who presents with productive cough with dark brown phlegm, subjective fever, sinus pain/ pressure, congestion, and N/V (now resolved) x 6 days.  Denies sick exposure to COVID, flu or strep.  [place] [employer].  Has tried OTC medications without relief.  Denies aggravating factors.  Reports previous symptoms in the past.   Denies chest pain, changes in bowel or bladder habits.    ROS: As per HPI.  All other pertinent ROS negative.     Past Medical History:  Diagnosis Date  . Allergy   . Anemia   . Anxiety   . Arthritis    knees, multiple joints  . Asthma   . Bell palsy   . Blood transfusion without reported diagnosis   . Breast mass    lt breast mass x's 3 years increased in size  . Chronic kidney disease   . Chronic pain syndrome   . COPD (chronic obstructive pulmonary disease) ([place])   . Depression   . Diabetes mellitus   . Endometrial cancer ([place])   . Fibromyalgia   . GERD (gastroesophageal reflux disease)   . 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 Hyperlipidemia   . Hypertension   . Hypothyroidism   . Multiple pulmonary nodules 11/30/2012   Followed in Pulmonary clinic/ [facility]/ [place]  - See CT abd  10/29/12  New right lower lobe pulmonary nodularity, primarily ground-  glass in density. This could reflect an inflammatory process,  although follow-up is necessary to exclude atypical neoplasm. Full  chest CT should be considered to evaluate for other pulmonary  findings.     . Multiple thyroid nodules   . Nephropathy   . PONV (postoperative nausea and vomiting)   . Pulmonary nodules   . Thyroid disease   . Ulcer   . Uterine cancer ([place]) 12/26/2011   Stage 1, grade 1, S/P hysterectomy initially by robotic technique and then salping[facility]oophorectomy at [facility] receiving no postoperative  treatment.   Past Surgical History:  Procedure Laterality Date  . ABDOMINAL HYSTERECTOMY     endometrial cancer  . CHOLECYSTECTOMY    . COLONOSCOPY WITH PROPOFOL N/A 01/19/2014   [id number]  . DILATION AND CURETTAGE OF UTERUS  12 yrs ago  . DILATION AND CURETTAGE OF UTERUS  06/18/2011   Procedure: DILATATION AND CURETTAGE;  Surgeon: Florian Buff, MD;  Location: [facility];  Service: Gynecology;  Laterality: N/A;  Suction Dilation and Curettage  . ESOPHAGOGASTRODUODENOSCOPY N/A 10/12/2013   Dr. Rourk:anastomotic ulcer likely cause of bleeding. likely ischemic   . ESOPHAGOGASTRODUODENOSCOPY (EGD) WITH PROPOFOL N/A 01/19/2014   [id number]  . EXTRACORPOREAL SHOCK WAVE LITHOTRIPSY Left 01/11/2018   Procedure: LEFT EXTRACORPOREAL SHOCK WAVE LITHOTRIPSY (ESWL);  Surgeon: Lucas Mallow, MD;  Location: [facility];  Service: Urology;  Laterality: Left;  . EXTRACORPOREAL SHOCK WAVE LITHOTRIPSY Left 01/14/2018   Procedure: LEFT EXTRACORPOREAL SHOCK WAVE LITHOTRIPSY (ESWL);  Surgeon: Cleon Gustin, MD;  Location: [facility];  Service: Urology;  Laterality: Left;  75 MINS  W/ MAC  . GASTRIC BYPASS  2013   [facility]  . HYSTEROSCOPY WITH D & C  06/18/2011   Procedure: DILATATION AND CURETTAGE /HYSTEROSCOPY;  Surgeon: Florian Buff, MD;  Location: [facility];  Service: Gynecology;  Laterality: N/A;  . LITHOTRIPSY    .  paniculectomy    . TRIGGER FINGER RELEASE     Allergies  Allergen Reactions  . Bee Venom Shortness Of Breath, Swelling and Rash  . Cephalexin Anaphylaxis  . Vancomycin Anaphylaxis and Hives    Has taken for bronchitis under medical supervision on site with benadryl  . Equetro [Carbamazepine]     Loss of balance, trembling, inability to think, resulting in ER visit shortly after start of course   . Adhesive [Tape] Rash  . Cefotaxime Rash  . Ciprofloxacin Hives and Rash  . Clindamycin Rash  . Latex Rash  . Penicillins Hives and Rash    Has patient had a PCN reaction causing immediate  rash, facial/tongue/throat swelling, SOB or lightheadedness with hypotension: Unknown Has patient had a PCN reaction causing severe rash involving mucus membranes or skin necrosis: Unknown Has patient had a PCN reaction that required hospitalization: Unknown Has patient had a PCN reaction occurring within the last 10 years: No If all of the above answers are "NO", then may proceed with Cephalosporin use.  . Sulfa Antibiotics Rash   No current facility-administered medications on file prior to encounter.   Current Outpatient Medications on File Prior to Encounter  Medication Sig Dispense Refill  . albuterol (PROVENTIL HFA;VENTOLIN HFA) 108 (90 BASE) MCG/ACT inhaler Inhale 2 puffs into the lungs every 6 (six) hours as needed. For asthma     . ALPRAZolam (XANAX) 1 MG tablet Take 1 mg by mouth 3 (three) times daily as needed for anxiety or sleep. FOR ANXIETY AND SLEEP    . amphetamine-dextroamphetamine (ADDERALL) 10 MG tablet Take 10 mg by mouth daily. In the afternoon  0  . amphetamine-dextroamphetamine (ADDERALL) 20 MG tablet Take 40 mg by mouth every morning.     . buprenorphine (BUTRANS) 10 MCG/HR PTWK patch Place 10 mcg onto the skin once a week.    Marland Kitchen buPROPion (WELLBUTRIN SR) 150 MG 12 hr tablet Take 150 mg by mouth daily.   2  . cefdinir (OMNICEF) 300 MG capsule Take 300 mg by mouth 2 (two) times daily.    . diphenhydrAMINE (BENADRYL) 25 mg capsule Take 25-75 mg by mouth See admin instructions. Taking 1 Capsule daily as needed for allergic reaction and takes 3 capsules at bedtime    . furosemide (LASIX) 20 MG tablet Take 20 mg by mouth daily.     Marland Kitchen gabapentin (NEURONTIN) 600 MG tablet Take 600-1,200 mg by mouth 3 (three) times daily as needed (for pain). 800 mg in am, 400 mg twice daily.     Marland Kitchen ibuprofen (ADVIL,MOTRIN) 200 MG tablet Take 200-600 mg by mouth every 6 (six) hours as needed for headache or moderate pain.     Marland Kitchen lamoTRIgine (LAMICTAL) 150 MG tablet Take 150 mg by mouth 2 (two)  times daily.    Marland Kitchen lidocaine (XYLOCAINE) 2 % jelly   0  . lisinopril (PRINIVIL,ZESTRIL) 40 MG tablet Take 40 mg by mouth daily.     . Melatonin 10 MG CAPS Take 1 capsule by mouth at bedtime.    . metoprolol succinate (TOPROL-XL) 50 MG 24 hr tablet Take 1 tablet by mouth daily.    . ondansetron (ZOFRAN) 8 MG tablet Take 8 mg by mouth every 8 (eight) hours as needed for nausea or vomiting.     . pantoprazole (PROTONIX) 40 MG tablet TAKE (1) TABLET TWICE DAILY. 60 tablet 3  . simvastatin (ZOCOR) 20 MG tablet Take 20 mg by mouth daily.   1  . sitaGLIPtin (JANUVIA) 100  MG tablet Take 100 mg by mouth daily.    . SUMAtriptan (IMITREX) 100 MG tablet Take 1 tablet by mouth daily as needed for migraine.     Marland Kitchen tiZANidine (ZANAFLEX) 4 MG tablet Take 4 mg by mouth at bedtime as needed for muscle spasms. For spasms.    Marland Kitchen vortioxetine HBr (TRINTELLIX) 20 MG TABS Take 20 mg by mouth every other day.      Social History   Socioeconomic History  . Marital status: Married    Spouse name: Jeneen Rinks "Rusty"  . Number of children: Not on file  . Years of education: 20  . Highest education level: Not on file  Occupational History  . Occupation: Event organiser employed)    Employer: NOT EMPLOYED  Tobacco Use  . Smoking status: Never Smoker  . Smokeless tobacco: Never Used  Vaping Use  . Vaping Use: Never used  Substance and Sexual Activity  . Alcohol use: No    Alcohol/week: 0.0 standard drinks  . Drug use: No  . Sexual activity: Yes    Birth control/protection: None, Surgical  Other Topics Concern  . Not on file  Social History Narrative   Lives with husband and 2 sons 93 and 41. (adopted)    Wants to work but says she is not dependable.  She does do volunteer work.    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  Working on disability.     Education: bachelors degree.   Disabled because of fibromyalgia and depression since 2010   Social Determinants of Health   Financial Resource Strain:   .  Difficulty of Paying Living Expenses: Not on file  Food Insecurity:   . Worried About Charity fundraiser in the Last Year: Not on file  . Ran Out of Food in the Last Year: Not on file  Transportation Needs:   . Lack of Transportation (Medical): Not on file  . Lack of Transportation (Non-Medical): Not on file  Physical Activity:   . Days of Exercise per Week: Not on file  . Minutes of Exercise per Session: Not on file  Stress:   . Feeling of Stress : Not on file  Social Connections:   . Frequency of Communication with Friends and Family: Not on file  . Frequency of Social Gatherings with Friends and Family: Not on file  . Attends Religious Services: Not on file  . Active Member of Clubs or Organizations: Not on file  . Attends Archivist Meetings: Not on file  . Marital Status: Not on file  Intimate Partner Violence:   . Fear of Current or Ex-Partner: Not on file  . Emotionally Abused: Not on file  . Physically Abused: Not on file  . Sexually Abused: Not on file   Family History  Problem Relation Age of Onset  . Pneumonia Mother        Deceased  . Arthritis Mother   . Asthma Mother   . Cancer Mother        pancreatic  . COPD Mother   . Depression Mother   . Diabetes Mother   . Kidney disease Mother   . Liver cancer Father        Living  . Arthritis Father   . Cancer Father        liver, prostate  . COPD Father   . Depression Father   . Stroke Father   . Heart disease Maternal Grandmother   . Mental illness Maternal Grandmother   .  Breast cancer Maternal Grandmother   . Alcohol abuse Paternal Grandfather   . Breast cancer Paternal Aunt   . Anesthesia problems Neg Hx   . Hypotension Neg Hx   . Malignant hyperthermia Neg Hx   . Pseudochol deficiency Neg Hx   . Colon cancer Neg Hx     OBJECTIVE:  Vitals:   03/02/20 1009 03/02/20 1012  BP: (!) 145/83   Pulse: 76   Resp: 20   Temp: 98.2 F (36.8 C)   TempSrc: Oral   SpO2: 91%   Weight:  268 lb  (121.6 kg)  Height:  _0  (1.626 m)     General appearance: alert; appears fatigued, but nontoxic; speaking in full sentences and tolerating own secretions HEENT: NCAT; Ears: EACs clear, TMs pearly gray; Eyes: PERRL.  EOM grossly intact. Sinuses: TTP; Nose: nares patent without rhinorrhea, Throat: oropharynx clear, tonsils non erythematous or enlarged, uvula midline  Neck: supple without LAD Lungs: unlabored respirations, symmetrical air entry; cough: mild; no respiratory distress; CTAB Heart: regular rate and rhythm.   Skin: warm and dry Psychological: alert and cooperative; normal mood and affect  ASSESSMENT & PLAN:  1. Cough     Meds ordered this encounter  Medications  . benzonatate (TESSALON) 100 MG capsule    Sig: Take 1 capsule (100 mg total) by mouth every 8 (eight) hours.    Dispense:  21 capsule    Refill:  0    Order Specific Question:   Supervising Provider    Answer:   Raylene Everts [[id number]]  . predniSONE (STERAPRED UNI-PAK 21 TAB) 10 MG (21) TBPK tablet    Sig: Take by mouth daily. Take 6 tabs by mouth daily  for 2 days, then 5 tabs for 2 days, then 4 tabs for 2 days, then 3 tabs for 2 days, 2 tabs for 2 days, then 1 tab by mouth daily for 2 days    Dispense:  42 tablet    Refill:  0    Order Specific Question:   Supervising Provider    Answer:   Raylene Everts [[id number]]  . doxycycline (VIBRAMYCIN) 100 MG capsule    Sig: Take 1 capsule (100 mg total) by mouth 2 (two) times daily.    Dispense:  20 capsule    Refill:  0    Order Specific Question:   Supervising Provider    Answer:   Raylene Everts [[id number]]   COVID testing ordered.  It will take between 5-7 days for test results.  Someone will contact you regarding abnormal results.    In the meantime: You should remain isolated in your home for 10 days from symptom onset AND greater than 72 hours after symptoms resolution (absence of fever without the use of fever-reducing medication and  improvement in respiratory symptoms), whichever is longer Get plenty of rest and push fluids Tessalon Perles prescribed for cough Use OTC zyrtec for nasal congestion, runny nose, and/or sore throat Use OTC flonase for nasal congestion and runny nose Use medications daily for symptom relief Use OTC medications like ibuprofen or tylenol as needed fever or pain Follow up with PCP next week for recheck Call or go to the ED if you have any new or worsening symptoms such as fever, worsening cough, shortness of breath, chest tightness, chest pain, turning blue, changes in mental status, etc...   Will cover for asthma flare as well as possible sinus infection.   Steroid shot given in office Prednisone and doxycycline  prescribed.   Will return in 2-3 days if not having improvement for chest x-ray  Reviewed expectations re: course of current medical issues. Questions answered. Outlined signs and symptoms indicating need for more acute intervention. Patient verbalized understanding. After Visit Summary given.         Lestine Box, PA-C 03/02/20 1042

## 2020-03-02 NOTE — ED Triage Notes (Addendum)
Cough, fever, congestion, fatigue that started Sunday. n&v on Monday that has resolved now.    Coughing up dark brown phlegm.

## 2020-03-02 NOTE — Discharge Instructions (Addendum)
COVID testing ordered.  It will take between 5-7 days for test results.  Someone will contact you regarding abnormal results.    In the meantime: You should remain isolated in your home for 10 days from symptom onset AND greater than 72 hours after symptoms resolution (absence of fever without the use of fever-reducing medication and improvement in respiratory symptoms), whichever is longer Get plenty of rest and push fluids Tessalon Perles prescribed for cough Use OTC zyrtec for nasal congestion, runny nose, and/or sore throat Use OTC flonase for nasal congestion and runny nose Use medications daily for symptom relief Use OTC medications like ibuprofen or tylenol as needed fever or pain Follow up with PCP next week for recheck Call or go to the ED if you have any new or worsening symptoms such as fever, worsening cough, shortness of breath, chest tightness, chest pain, turning blue, changes in mental status, etc...   Will cover for asthma flare as well as possible sinus infection.   Steroid shot given in office Prednisone and doxycycline prescribed.   Will return in 2-3 days if not having improvement for chest x-ray

## 2020-03-03 LAB — COVID-19, FLU A+B AND RSV
Influenza A, NAA: NOT DETECTED
Influenza B, NAA: NOT DETECTED
RSV, NAA: NOT DETECTED
SARS-CoV-2, NAA: NOT DETECTED

## 2020-03-07 ENCOUNTER — Telehealth: Payer: Self-pay | Admitting: Emergency Medicine

## 2020-03-07 MED ORDER — AZITHROMYCIN 250 MG PO TABS: 250.0000 mg | ORAL_TABLET | Freq: Every day | ORAL | 0 refills | Status: DC

## 2020-03-07 MED ORDER — NYSTATIN 100000 UNIT/ML MT SUSP: 500000.0000 [IU] | Freq: Four times a day (QID) | OROMUCOSAL | 0 refills | Status: DC

## 2020-03-07 NOTE — Telephone Encounter (Signed)
Reports "raw" feeling in mouth.  Currently on prednisone taper and doxycycline.  Would like to try a different antibiotic.  Z-pak sent to pharmacy on file.  Nystatin also sent to pharmacy on file for possible thrush.  Thrush infection may be secondary to prednisone use or hx of wearing dentures.  All questions answered.

## 2020-04-20 ENCOUNTER — Other Ambulatory Visit: Payer: Self-pay

## 2020-04-20 ENCOUNTER — Ambulatory Visit
Admission: EM | Admit: 2020-04-20 | Discharge: 2020-04-20 | Disposition: A | Discharge status: Home / Self Care | Payer: 59 | Attending: Emergency Medicine | Admitting: Emergency Medicine

## 2020-04-20 DIAGNOSIS — R531 Weakness: Secondary | ICD-10-CM | POA: Insufficient documentation

## 2020-04-20 DIAGNOSIS — R410 Disorientation, unspecified: Secondary | ICD-10-CM | POA: Diagnosis not present

## 2020-04-20 LAB — POCT URINALYSIS DIP (MANUAL ENTRY)
Bilirubin, UA: NEGATIVE
Blood, UA: NEGATIVE
Glucose, UA: NEGATIVE mg/dL
Ketones, POC UA: NEGATIVE mg/dL
Leukocytes, UA: NEGATIVE
Nitrite, UA: NEGATIVE
Protein Ur, POC: NEGATIVE mg/dL
Spec Grav, UA: 1.015 (ref 1.010–1.025)
Urobilinogen, UA: 0.2 E.U./dL
pH, UA: 5.5 (ref 5.0–8.0)

## 2020-04-20 NOTE — Discharge Instructions (Addendum)
Urinalysis was negative for UTI Urine culture sent.  We will call you with the results.   Push fluids and get plenty of rest.   Follow up with PCP if symptoms persists Go to ER if you have any new or worsening symptoms such as fever, worsening abdominal pain, nausea/vomiting, flank pain, etc..Marland Kitchen

## 2020-04-20 NOTE — ED Provider Notes (Signed)
[facility]   Chief Complaint  Patient presents with  . Altered Mental Status     SUBJECTIVE:  Molly Berry is a 54 y.o. female who presented to the urgent care with a complaint of confusion and morning time, weakness for the past 3 days.  Denies a precipitating event.  She reports she has been sleeping longer as well.  Has has not tried any OTC medication.  Denies alleviating or aggravating factor.  Denies similar symptoms in the past.  Denies fever, chills, nausea, vomiting, abdominal pain, flank pain, diplopia, paresthesia, blurry vision, facial droop.  LMP: Patient's last menstrual period was 05/06/2011.  ROS: As in HPI.  All other pertinent ROS negative.     Past Medical History:  Diagnosis Date  . Allergy   . Anemia   . Anxiety   . Arthritis    knees, multiple joints  . Asthma   . Bell palsy   . Blood transfusion without reported diagnosis   . Breast mass    lt breast mass x's 3 years increased in size  . Chronic kidney disease   . Chronic pain syndrome   . COPD (chronic obstructive pulmonary disease) ([place])   . Depression   . Diabetes mellitus   . Endometrial cancer ([place])   . Fibromyalgia   . GERD (gastroesophageal reflux disease)   . 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 Hyperlipidemia   . Hypertension   . Hypothyroidism   . Multiple pulmonary nodules 11/30/2012   Followed in Pulmonary clinic/ [facility]/ [place]  - See CT abd  10/29/12  New right lower lobe pulmonary nodularity, primarily ground-  glass in density. This could reflect an inflammatory process,  although follow-up is necessary to exclude atypical neoplasm. Full  chest CT should be considered to evaluate for other pulmonary  findings.     . Multiple thyroid nodules   . Nephropathy   . PONV (postoperative nausea and vomiting)   . Pulmonary nodules   . Thyroid disease   . Ulcer   . Uterine cancer ([place]) 12/26/2011   Stage 1, grade 1, S/P hysterectomy initially by robotic  technique and then salpingo-oophorectomy at [facility] receiving no postoperative treatment.   Past Surgical History:  Procedure Laterality Date  . ABDOMINAL HYSTERECTOMY     endometrial cancer  . CHOLECYSTECTOMY    . COLONOSCOPY WITH PROPOFOL N/A 01/19/2014   [id number]  . DILATION AND CURETTAGE OF UTERUS  12 yrs ago  . DILATION AND CURETTAGE OF UTERUS  06/18/2011   Procedure: DILATATION AND CURETTAGE;  Surgeon: Florian Buff, MD;  Location: [facility];  Service: Gynecology;  Laterality: N/A;  Suction Dilation and Curettage  . ESOPHAGOGASTRODUODENOSCOPY N/A 10/12/2013   Dr. Rourk:anastomotic ulcer likely cause of bleeding. likely ischemic   . ESOPHAGOGASTRODUODENOSCOPY (EGD) WITH PROPOFOL N/A 01/19/2014   VZC:HYIFOY  . EXTRACORPOREAL SHOCK WAVE LITHOTRIPSY Left 01/11/2018   Procedure: LEFT EXTRACORPOREAL SHOCK WAVE LITHOTRIPSY (ESWL);  Surgeon: Lucas Mallow, MD;  Location: [facility];  Service: Urology;  Laterality: Left;  . EXTRACORPOREAL SHOCK WAVE LITHOTRIPSY Left 01/14/2018   Procedure: LEFT EXTRACORPOREAL SHOCK WAVE LITHOTRIPSY (ESWL);  Surgeon: Cleon Gustin, MD;  Location: [facility];  Service: Urology;  Laterality: Left;  75 MINS  W/ MAC  . GASTRIC BYPASS  2013   [facility]  . HYSTEROSCOPY WITH D & C  06/18/2011   Procedure: DILATATION AND CURETTAGE /HYSTEROSCOPY;  Surgeon: Florian Buff, MD;  Location: [facility];  Service:  Gynecology;  Laterality: N/A;  . LITHOTRIPSY    . paniculectomy    . TRIGGER FINGER RELEASE     Allergies  Allergen Reactions  . Bee Venom Shortness Of Breath, Swelling and Rash  . Cephalexin Anaphylaxis  . Vancomycin Anaphylaxis and Hives    Has taken for bronchitis under medical supervision on site with benadryl  . Equetro [Carbamazepine]     Loss of balance, trembling, inability to think, resulting in ER visit shortly after start of course   . Adhesive [Tape] Rash  . Cefotaxime Rash  . Ciprofloxacin Hives and Rash  . Clindamycin Rash  . Latex Rash  .  Penicillins Hives and Rash    Has patient had a PCN reaction causing immediate rash, facial/tongue/throat swelling, SOB or lightheadedness with hypotension: Unknown Has patient had a PCN reaction causing severe rash involving mucus membranes or skin necrosis: Unknown Has patient had a PCN reaction that required hospitalization: Unknown Has patient had a PCN reaction occurring within the last 10 years: No If all of the above answers are "NO", then may proceed with Cephalosporin use.  . Sulfa Antibiotics Rash   No current facility-administered medications on file prior to encounter.   Current Outpatient Medications on File Prior to Encounter  Medication Sig Dispense Refill  . albuterol (PROVENTIL HFA;VENTOLIN HFA) 108 (90 BASE) MCG/ACT inhaler Inhale 2 puffs into the lungs every 6 (six) hours as needed. For asthma     . ALPRAZolam (XANAX) 1 MG tablet Take 1 mg by mouth 3 (three) times daily as needed for anxiety or sleep. FOR ANXIETY AND SLEEP    . amphetamine-dextroamphetamine (ADDERALL) 10 MG tablet Take 10 mg by mouth daily. In the afternoon  0  . amphetamine-dextroamphetamine (ADDERALL) 20 MG tablet Take 40 mg by mouth every morning.     Marland Kitchen azithromycin (ZITHROMAX) 250 MG tablet Take 1 tablet (250 mg total) by mouth daily. Take first 2 tablets together, then 1 every day until finished. 6 tablet 0  . benzonatate (TESSALON) 100 MG capsule Take 1 capsule (100 mg total) by mouth every 8 (eight) hours. 21 capsule 0  . buprenorphine (BUTRANS) 10 MCG/HR PTWK patch Place 10 mcg onto the skin once a week.    Marland Kitchen buPROPion (WELLBUTRIN SR) 150 MG 12 hr tablet Take 150 mg by mouth daily.   2  . cefdinir (OMNICEF) 300 MG capsule Take 300 mg by mouth 2 (two) times daily.    . diphenhydrAMINE (BENADRYL) 25 mg capsule Take 25-75 mg by mouth See admin instructions. Taking 1 Capsule daily as needed for allergic reaction and takes 3 capsules at bedtime    . furosemide (LASIX) 20 MG tablet Take 20 mg by mouth  daily.     Marland Kitchen gabapentin (NEURONTIN) 600 MG tablet Take 600-1,200 mg by mouth 3 (three) times daily as needed (for pain). 800 mg in am, 400 mg twice daily.     Marland Kitchen ibuprofen (ADVIL,MOTRIN) 200 MG tablet Take 200-600 mg by mouth every 6 (six) hours as needed for headache or moderate pain.     Marland Kitchen lamoTRIgine (LAMICTAL) 150 MG tablet Take 150 mg by mouth 2 (two) times daily.    Marland Kitchen lidocaine (XYLOCAINE) 2 % jelly   0  . lisinopril (PRINIVIL,ZESTRIL) 40 MG tablet Take 40 mg by mouth daily.     . Melatonin 10 MG CAPS Take 1 capsule by mouth at bedtime.    . metoprolol succinate (TOPROL-XL) 50 MG 24 hr tablet Take 1 tablet by mouth daily.    Marland Kitchen  nystatin (MYCOSTATIN) 100000 UNIT/ML suspension Take 5 mLs (500,000 Units total) by mouth 4 (four) times daily. 473 mL 0  . ondansetron (ZOFRAN) 8 MG tablet Take 8 mg by mouth every 8 (eight) hours as needed for nausea or vomiting.     . pantoprazole (PROTONIX) 40 MG tablet TAKE (1) TABLET TWICE DAILY. 60 tablet 3  . predniSONE (STERAPRED UNI-PAK 21 TAB) 10 MG (21) TBPK tablet Take by mouth daily. Take 6 tabs by mouth daily  for 2 days, then 5 tabs for 2 days, then 4 tabs for 2 days, then 3 tabs for 2 days, 2 tabs for 2 days, then 1 tab by mouth daily for 2 days 42 tablet 0  . simvastatin (ZOCOR) 20 MG tablet Take 20 mg by mouth daily.   1  . sitaGLIPtin (JANUVIA) 100 MG tablet Take 100 mg by mouth daily.    . SUMAtriptan (IMITREX) 100 MG tablet Take 1 tablet by mouth daily as needed for migraine.     Marland Kitchen tiZANidine (ZANAFLEX) 4 MG tablet Take 4 mg by mouth at bedtime as needed for muscle spasms. For spasms.    Marland Kitchen vortioxetine HBr (TRINTELLIX) 20 MG TABS Take 20 mg by mouth every other day.      Social History   Socioeconomic History  . Marital status: Married    Spouse name: Jeneen Rinks "Rusty"  . Number of children: Not on file  . Years of education: 53  . Highest education level: Not on file  Occupational History  . Occupation: Event organiser employed)    Employer:  NOT EMPLOYED  Tobacco Use  . Smoking status: Never Smoker  . Smokeless tobacco: Never Used  Vaping Use  . Vaping Use: Never used  Substance and Sexual Activity  . Alcohol use: No    Alcohol/week: 0.0 standard drinks  . Drug use: No  . Sexual activity: Yes    Birth control/protection: None, Surgical  Other Topics Concern  . Not on file  Social History Narrative   Lives with husband and 2 sons 53 and 64. (adopted)    Wants to work but says she is not dependable.  She does do volunteer work.    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  Working on disability.     Education: bachelors degree.   Disabled because of fibromyalgia and depression since 2010   Social Determinants of Health   Financial Resource Strain: Not on file  Food Insecurity: Not on file  Transportation Needs: Not on file  Physical Activity: Not on file  Stress: Not on file  Social Connections: Not on file  Intimate Partner Violence: Not on file   Family History  Problem Relation Age of Onset  . Pneumonia Mother        Deceased  . Arthritis Mother   . Asthma Mother   . Cancer Mother        pancreatic  . COPD Mother   . Depression Mother   . Diabetes Mother   . Kidney disease Mother   . Liver cancer Father        Living  . Arthritis Father   . Cancer Father        liver, prostate  . COPD Father   . Depression Father   . Stroke Father   . Heart disease Maternal Grandmother   . Mental illness Maternal Grandmother   . Breast cancer Maternal Grandmother   . Alcohol abuse Paternal Grandfather   . Breast cancer Paternal Aunt   .  Anesthesia problems Neg Hx   . Hypotension Neg Hx   . Malignant hyperthermia Neg Hx   . Pseudochol deficiency Neg Hx   . Colon cancer Neg Hx     OBJECTIVE:  Vitals:   04/20/20 1724  BP: 135/84  Pulse: 83  Resp: 16  Temp: 98.5 F (36.9 C)  SpO2: 96%   Physical Exam Vitals and nursing note reviewed.  Constitutional:      General: She is not in acute distress.     Appearance: Normal appearance. She is normal weight. She is not ill-appearing, toxic-appearing or diaphoretic.  HENT:     Head: Normocephalic.  Cardiovascular:     Rate and Rhythm: Normal rate and regular rhythm.     Pulses: Normal pulses.     Heart sounds: Normal heart sounds. No murmur heard. No friction rub. No gallop.   Pulmonary:     Effort: Pulmonary effort is normal. No respiratory distress.     Breath sounds: Normal breath sounds. No stridor. No wheezing, rhonchi or rales.  Chest:     Chest wall: No tenderness.  Neurological:     General: No focal deficit present.     Mental Status: She is alert and oriented to person, place, and time.     GCS: GCS eye subscore is 4. GCS verbal subscore is 5. GCS motor subscore is 6.     Cranial Nerves: Cranial nerves are intact.     Sensory: Sensation is intact.     Motor: Motor function is intact.     Coordination: Coordination is intact. Romberg sign negative.     Gait: Gait is intact.     Labs Reviewed  URINE CULTURE  POCT URINALYSIS DIP (MANUAL ENTRY)    ASSESSMENT & PLAN:  1. Confusion   2. Weakness     No orders of the defined types were placed in this encounter.  Discharge instructions  Urinalysis was negative for UTI Urine culture sent.  We will call you with the results.   Push fluids and get plenty of rest.   Follow up with PCP if symptoms persists Go to ER if you have any new or worsening symptoms such as fever, worsening abdominal pain, nausea/vomiting, flank pain, etc...  Outlined signs and symptoms indicating need for more acute intervention. Patient verbalized understanding. After Visit Summary given.     Emerson Monte, FNP 04/20/20 1823

## 2020-04-20 NOTE — ED Triage Notes (Signed)
Pt presents with c/o weakness and memory loss , states she lost track of time and has felt weak for past couple days, also has c/o sore throat

## 2020-04-20 NOTE — ED Triage Notes (Signed)
Pt is currently alert and oriented

## 2020-04-22 LAB — URINE CULTURE: Culture: <10000 — AB

## 2020-05-02 ENCOUNTER — Other Ambulatory Visit: Payer: Self-pay

## 2020-05-02 ENCOUNTER — Encounter: Payer: Self-pay | Admitting: Emergency Medicine

## 2020-05-02 ENCOUNTER — Ambulatory Visit
Admission: EM | Admit: 2020-05-02 | Discharge: 2020-05-02 | Disposition: A | Discharge status: Home / Self Care | Payer: 59 | Attending: Family Medicine | Admitting: Family Medicine

## 2020-05-02 DIAGNOSIS — R3915 Urgency of urination: Secondary | ICD-10-CM

## 2020-05-02 DIAGNOSIS — R350 Frequency of micturition: Secondary | ICD-10-CM | POA: Diagnosis present

## 2020-05-02 DIAGNOSIS — N3000 Acute cystitis without hematuria: Secondary | ICD-10-CM | POA: Diagnosis present

## 2020-05-02 DIAGNOSIS — R300 Dysuria: Secondary | ICD-10-CM | POA: Diagnosis present

## 2020-05-02 LAB — POCT URINALYSIS DIP (MANUAL ENTRY)
Bilirubin, UA: NEGATIVE
Glucose, UA: 100 mg/dL — AB
Ketones, POC UA: NEGATIVE mg/dL
Leukocytes, UA: NEGATIVE
Nitrite, UA: POSITIVE — AB
Protein Ur, POC: 30 mg/dL — AB
Spec Grav, UA: 1.02 (ref 1.010–1.025)
Urobilinogen, UA: 2 E.U./dL — AB
pH, UA: 5.5 (ref 5.0–8.0)

## 2020-05-02 MED ORDER — NITROFURANTOIN MONOHYD MACRO 100 MG PO CAPS: 100.0000 mg | ORAL_CAPSULE | Freq: Two times a day (BID) | ORAL | 0 refills | Status: DC

## 2020-05-02 MED ORDER — PHENAZOPYRIDINE HCL 200 MG PO TABS: 200.0000 mg | ORAL_TABLET | Freq: Three times a day (TID) | ORAL | 0 refills | Status: DC

## 2020-05-02 NOTE — Discharge Instructions (Addendum)
You may have a urinary tract infection.   I have sent in Macrobid for you to take twice a day for 5 days  I have sent in pyridium for you to take three times per day for the next 2 days  We are going to culture your urine and will call you as soon as we have the results.   Drink plenty of water, 8-10 glasses per day.   You may take AZO over the counter for painful urination.  Follow up with your primary care provider as needed.   Go to the Emergency Department if you experience severe pain, shortness of breath, high fever, or other concerns.

## 2020-05-02 NOTE — ED Triage Notes (Signed)
Urinary frequency, lower abd pain, burning with urination that started last night. Pt took one pyridium this morning.

## 2020-05-02 NOTE — ED Provider Notes (Signed)
[facility]   CC: UTI  SUBJECTIVE:  Molly Berry is a 54 y.o. female who complains of urinary frequency, urgency and dysuria for the past day. Patient denies a precipitating event, recent sexual encounter, excessive caffeine intake. Localizes the pain to the lower abdomen. Pain is intermittent and describes it as sharp. Has taken pyridium with some relief. Has hx multiple UTIs. Symptoms are made worse with urination. Admits to similar symptoms in the past.  Denies fever, chills, nausea, vomiting, abdominal pain, flank pain, abnormal vaginal discharge or bleeding, hematuria.    LMP: Patient's last menstrual period was 05/06/2011.  ROS: As in HPI.  All other pertinent ROS negative.     Past Medical History:  Diagnosis Date  . Allergy   . Anemia   . Anxiety   . Arthritis    knees, multiple joints  . Asthma   . Bell palsy   . Blood transfusion without reported diagnosis   . Breast mass    lt breast mass x's 3 years increased in size  . Chronic kidney disease   . Chronic pain syndrome   . COPD (chronic obstructive pulmonary disease) ([place])   . Depression   . Diabetes mellitus   . Endometrial cancer ([place])   . Fibromyalgia   . GERD (gastroesophageal reflux disease)   . 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 Hyperlipidemia   . Hypertension   . Hypothyroidism   . Multiple pulmonary nodules 11/30/2012   Followed in Pulmonary clinic/ [facility]/ [place]  - See CT abd  10/29/12  New right lower lobe pulmonary nodularity, primarily ground-  glass in density. This could reflect an inflammatory process,  although follow-up is necessary to exclude atypical neoplasm. Full  chest CT should be considered to evaluate for other pulmonary  findings.     . Multiple thyroid nodules   . Nephropathy   . PONV (postoperative nausea and vomiting)   . Pulmonary nodules   . Thyroid disease   . Ulcer   . Uterine cancer ([place]) 12/26/2011   Stage 1, grade 1, S/P hysterectomy initially  by robotic technique and then salpingo[facility]oophorectomy at [facility] receiving no postoperative treatment.   Past Surgical History:  Procedure Laterality Date  . ABDOMINAL HYSTERECTOMY     endometrial cancer  . CHOLECYSTECTOMY    . COLONOSCOPY WITH PROPOFOL N/A 01/19/2014   [id number]  . DILATION AND CURETTAGE OF UTERUS  12 yrs ago  . DILATION AND CURETTAGE OF UTERUS  06/18/2011   Procedure: DILATATION AND CURETTAGE;  Surgeon: Florian Buff, MD;  Location: [facility];  Service: Gynecology;  Laterality: N/A;  Suction Dilation and Curettage  . ESOPHAGOGASTRODUODENOSCOPY N/A 10/12/2013   Dr. Rourk:anastomotic ulcer likely cause of bleeding. likely ischemic   . ESOPHAGOGASTRODUODENOSCOPY (EGD) WITH PROPOFOL N/A 01/19/2014   [id number]  . EXTRACORPOREAL SHOCK WAVE LITHOTRIPSY Left 01/11/2018   Procedure: LEFT EXTRACORPOREAL SHOCK WAVE LITHOTRIPSY (ESWL);  Surgeon: Lucas Mallow, MD;  Location: [facility];  Service: Urology;  Laterality: Left;  . EXTRACORPOREAL SHOCK WAVE LITHOTRIPSY Left 01/14/2018   Procedure: LEFT EXTRACORPOREAL SHOCK WAVE LITHOTRIPSY (ESWL);  Surgeon: Cleon Gustin, MD;  Location: [facility];  Service: Urology;  Laterality: Left;  75 MINS  W/ MAC  . GASTRIC BYPASS  2013   [facility]  . HYSTEROSCOPY WITH D & C  06/18/2011   Procedure: DILATATION AND CURETTAGE /HYSTEROSCOPY;  Surgeon: Florian Buff, MD;  Location: [facility];  Service: Gynecology;  Laterality: N/A;  .  LITHOTRIPSY    . paniculectomy    . TRIGGER FINGER RELEASE     Allergies  Allergen Reactions  . Bee Venom Shortness Of Breath, Swelling and Rash  . Cephalexin Anaphylaxis  . Vancomycin Anaphylaxis and Hives    Has taken for bronchitis under medical supervision on site with benadryl  . Equetro [Carbamazepine]     Loss of balance, trembling, inability to think, resulting in ER visit shortly after start of course   . Adhesive [Tape] Rash  . Cefotaxime Rash  . Ciprofloxacin Hives and Rash  . Clindamycin Rash  . Latex Rash   . Penicillins Hives and Rash    Has patient had a PCN reaction causing immediate rash, facial/tongue/throat swelling, SOB or lightheadedness with hypotension: Unknown Has patient had a PCN reaction causing severe rash involving mucus membranes or skin necrosis: Unknown Has patient had a PCN reaction that required hospitalization: Unknown Has patient had a PCN reaction occurring within the last 10 years: No If all of the above answers are "NO", then may proceed with Cephalosporin use.  . Sulfa Antibiotics Rash   No current facility-administered medications on file prior to encounter.   Current Outpatient Medications on File Prior to Encounter  Medication Sig Dispense Refill  . albuterol (PROVENTIL HFA;VENTOLIN HFA) 108 (90 BASE) MCG/ACT inhaler Inhale 2 puffs into the lungs every 6 (six) hours as needed. For asthma     . ALPRAZolam (XANAX) 1 MG tablet Take 1 mg by mouth 3 (three) times daily as needed for anxiety or sleep. FOR ANXIETY AND SLEEP    . amphetamine-dextroamphetamine (ADDERALL) 10 MG tablet Take 10 mg by mouth daily. In the afternoon  0  . amphetamine-dextroamphetamine (ADDERALL) 20 MG tablet Take 40 mg by mouth every morning.     Marland Kitchen azithromycin (ZITHROMAX) 250 MG tablet Take 1 tablet (250 mg total) by mouth daily. Take first 2 tablets together, then 1 every day until finished. 6 tablet 0  . benzonatate (TESSALON) 100 MG capsule Take 1 capsule (100 mg total) by mouth every 8 (eight) hours. 21 capsule 0  . buprenorphine (BUTRANS) 10 MCG/HR PTWK patch Place 10 mcg onto the skin once a week.    Marland Kitchen buPROPion (WELLBUTRIN SR) 150 MG 12 hr tablet Take 150 mg by mouth daily.   2  . cefdinir (OMNICEF) 300 MG capsule Take 300 mg by mouth 2 (two) times daily.    . diphenhydrAMINE (BENADRYL) 25 mg capsule Take 25-75 mg by mouth See admin instructions. Taking 1 Capsule daily as needed for allergic reaction and takes 3 capsules at bedtime    . furosemide (LASIX) 20 MG tablet Take 20 mg by mouth  daily.     Marland Kitchen gabapentin (NEURONTIN) 600 MG tablet Take 600-1,200 mg by mouth 3 (three) times daily as needed (for pain). 800 mg in am, 400 mg twice daily.     Marland Kitchen ibuprofen (ADVIL,MOTRIN) 200 MG tablet Take 200-600 mg by mouth every 6 (six) hours as needed for headache or moderate pain.     Marland Kitchen lamoTRIgine (LAMICTAL) 150 MG tablet Take 150 mg by mouth 2 (two) times daily.    Marland Kitchen lidocaine (XYLOCAINE) 2 % jelly   0  . lisinopril (PRINIVIL,ZESTRIL) 40 MG tablet Take 40 mg by mouth daily.     . Melatonin 10 MG CAPS Take 1 capsule by mouth at bedtime.    . metoprolol succinate (TOPROL-XL) 50 MG 24 hr tablet Take 1 tablet by mouth daily.    Marland Kitchen nystatin (MYCOSTATIN)  100000 UNIT/ML suspension Take 5 mLs (500,000 Units total) by mouth 4 (four) times daily. 473 mL 0  . ondansetron (ZOFRAN) 8 MG tablet Take 8 mg by mouth every 8 (eight) hours as needed for nausea or vomiting.     . pantoprazole (PROTONIX) 40 MG tablet TAKE (1) TABLET TWICE DAILY. 60 tablet 3  . predniSONE (STERAPRED UNI-PAK 21 TAB) 10 MG (21) TBPK tablet Take by mouth daily. Take 6 tabs by mouth daily  for 2 days, then 5 tabs for 2 days, then 4 tabs for 2 days, then 3 tabs for 2 days, 2 tabs for 2 days, then 1 tab by mouth daily for 2 days 42 tablet 0  . simvastatin (ZOCOR) 20 MG tablet Take 20 mg by mouth daily.   1  . sitaGLIPtin (JANUVIA) 100 MG tablet Take 100 mg by mouth daily.    . SUMAtriptan (IMITREX) 100 MG tablet Take 1 tablet by mouth daily as needed for migraine.     Marland Kitchen tiZANidine (ZANAFLEX) 4 MG tablet Take 4 mg by mouth at bedtime as needed for muscle spasms. For spasms.    Marland Kitchen vortioxetine HBr (TRINTELLIX) 20 MG TABS Take 20 mg by mouth every other day.      Social History   Socioeconomic History  . Marital status: Married    Spouse name: Jeneen Rinks "Rusty"  . Number of children: Not on file  . Years of education: 5  . Highest education level: Not on file  Occupational History  . Occupation: Event organiser employed)    Employer:  NOT EMPLOYED  Tobacco Use  . Smoking status: Never Smoker  . Smokeless tobacco: Never Used  Vaping Use  . Vaping Use: Never used  Substance and Sexual Activity  . Alcohol use: No    Alcohol/week: 0.0 standard drinks  . Drug use: No  . Sexual activity: Yes    Birth control/protection: None, Surgical  Other Topics Concern  . Not on file  Social History Narrative   Lives with husband and 2 sons 4 and 97. (adopted)    Wants to work but says she is not dependable.  She does do volunteer work.    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  Working on disability.     Education: bachelors degree.   Disabled because of fibromyalgia and depression since 2010   Social Determinants of Health   Financial Resource Strain: Not on file  Food Insecurity: Not on file  Transportation Needs: Not on file  Physical Activity: Not on file  Stress: Not on file  Social Connections: Not on file  Intimate Partner Violence: Not on file   Family History  Problem Relation Age of Onset  . Pneumonia Mother        Deceased  . Arthritis Mother   . Asthma Mother   . Cancer Mother        pancreatic  . COPD Mother   . Depression Mother   . Diabetes Mother   . Kidney disease Mother   . Liver cancer Father        Living  . Arthritis Father   . Cancer Father        liver, prostate  . COPD Father   . Depression Father   . Stroke Father   . Heart disease Maternal Grandmother   . Mental illness Maternal Grandmother   . Breast cancer Maternal Grandmother   . Alcohol abuse Paternal Grandfather   . Breast cancer Paternal Aunt   .  Anesthesia problems Neg Hx   . Hypotension Neg Hx   . Malignant hyperthermia Neg Hx   . Pseudochol deficiency Neg Hx   . Colon cancer Neg Hx     OBJECTIVE:  Vitals:   05/02/20 1600 05/02/20 1602 05/02/20 1605  BP:  120/80   Pulse:   (!) 57  Resp:  19   Temp:  98.2 F (36.8 C)   TempSrc:  Oral   SpO2:  94%   Weight: 269 lb (122 kg)    Height: _0  (1.626 m)      General appearance: AOx3 in no acute distress HEENT: NCAT. Oropharynx clear.  Lungs: clear to auscultation bilaterally without adventitious breath sounds Heart: regular rate and rhythm. Radial pulses 2+ symmetrical bilaterally Abdomen: soft; non-distended; suprapubic tenderness; bowel sounds present; no guarding or rebound tenderness Back: no CVA tenderness Extremities: no edema; symmetrical with no gross deformities Skin: warm and dry Neurologic: Ambulates from chair to exam table without difficulty Psychological: alert and cooperative; normal mood and affect  Labs Reviewed  POCT URINALYSIS DIP (MANUAL ENTRY) - Abnormal; Notable for the following components:      Result Value   Color, UA orange (*)    Glucose, UA =100 (*)    Blood, UA trace-intact (*)    Protein Ur, POC =30 (*)    Urobilinogen, UA 2.0 (*)    Nitrite, UA Positive (*)    All other components within normal limits  URINE CULTURE    ASSESSMENT & PLAN:  1. Acute cystitis without hematuria   2. Dysuria   3. Urinary urgency   4. Urinary frequency     Meds ordered this encounter  Medications  . nitrofurantoin, macrocrystal-monohydrate, (MACROBID) 100 MG capsule    Sig: Take 1 capsule (100 mg total) by mouth 2 (two) times daily.    Dispense:  10 capsule    Refill:  0    Order Specific Question:   Supervising Provider    Answer:   Chase Picket [id number]  . phenazopyridine (PYRIDIUM) 200 MG tablet    Sig: Take 1 tablet (200 mg total) by mouth 3 (three) times daily.    Dispense:  6 tablet    Refill:  0    Order Specific Question:   Supervising Provider    Answer:   Chase Picket [[id number]]   Prescribed Macrobid Prescribed pyridium Urine culture sent  We will call you with abnormal results that need further treatment Push fluids and get plenty of rest Take antibiotic as directed and to completion Take pyridium as prescribed and as needed for symptomatic relief Follow up with PCP if symptoms  persists Return here or go to ER if you have any new or worsening symptoms such as fever, worsening abdominal pain, nausea/vomiting, flank pain  Outlined signs and symptoms indicating need for more acute intervention Patient verbalized understanding After Visit Summary given     Faustino Congress, NP 05/02/20 1650

## 2020-05-04 LAB — URINE CULTURE
Culture: <10000 — AB
Special Requests: NORMAL

## 2020-05-11 ENCOUNTER — Other Ambulatory Visit: Payer: Self-pay | Admitting: Physician Assistant

## 2020-07-30 ENCOUNTER — Other Ambulatory Visit: Payer: Self-pay

## 2020-07-30 ENCOUNTER — Ambulatory Visit (INDEPENDENT_AMBULATORY_CARE_PROVIDER_SITE_OTHER): Payer: 59

## 2020-07-30 ENCOUNTER — Encounter: Payer: Self-pay | Admitting: Emergency Medicine

## 2020-07-30 ENCOUNTER — Ambulatory Visit
Admission: EM | Admit: 2020-07-30 | Discharge: 2020-07-30 | Disposition: A | Discharge status: Home / Self Care | Payer: 59 | Attending: Family Medicine | Admitting: Family Medicine

## 2020-07-30 DIAGNOSIS — R062 Wheezing: Secondary | ICD-10-CM

## 2020-07-30 DIAGNOSIS — R059 Cough, unspecified: Secondary | ICD-10-CM | POA: Diagnosis not present

## 2020-07-30 DIAGNOSIS — J22 Unspecified acute lower respiratory infection: Secondary | ICD-10-CM

## 2020-07-30 DIAGNOSIS — R0789 Other chest pain: Secondary | ICD-10-CM

## 2020-07-30 DIAGNOSIS — R053 Chronic cough: Secondary | ICD-10-CM

## 2020-07-30 DIAGNOSIS — R0602 Shortness of breath: Secondary | ICD-10-CM | POA: Diagnosis not present

## 2020-07-30 LAB — POCT URINALYSIS DIP (MANUAL ENTRY)
Bilirubin, UA: NEGATIVE
Glucose, UA: NEGATIVE mg/dL
Ketones, POC UA: NEGATIVE mg/dL
Leukocytes, UA: NEGATIVE
Nitrite, UA: NEGATIVE
Protein Ur, POC: NEGATIVE mg/dL
Spec Grav, UA: 1.01 (ref 1.010–1.025)
Urobilinogen, UA: 0.2 E.U./dL
pH, UA: 5.5 (ref 5.0–8.0)

## 2020-07-30 LAB — POCT FASTING CBG KUC MANUAL ENTRY: POCT Glucose (KUC): 115 mg/dL — AB (ref 70–99)

## 2020-07-30 MED ORDER — AZITHROMYCIN 250 MG PO TABS: ORAL_TABLET | ORAL | 0 refills | Status: DC

## 2020-07-30 MED ORDER — BENZONATATE 100 MG PO CAPS: 100.0000 mg | ORAL_CAPSULE | Freq: Three times a day (TID) | ORAL | 0 refills | Status: DC | PRN

## 2020-07-30 MED ORDER — PREDNISONE 20 MG PO TABS: 20.0000 mg | ORAL_TABLET | Freq: Every day | ORAL | 0 refills | Status: DC

## 2020-07-30 NOTE — ED Triage Notes (Signed)
Cough and chest congestion for 1 1/2 weeks.

## 2020-07-30 NOTE — Discharge Instructions (Addendum)
Treating for early possible pneumonia given findings of chest xray. If you breathing worsens or your symptoms do not improve with current treatment and or your oxygen level remains consistently less than 90, go immediately to nearest Emergency Department.

## 2020-07-30 NOTE — ED Provider Notes (Signed)
[facility]    CSN: [id number] Arrival date & time: 07/30/20  1147      History   Chief Complaint Chief Complaint  Patient presents with  . Cough    HPI Molly Berry is a 55 y.o. female.   HPI Patient presents for evaluation of cough, chest congestion x 1.5 weeks. Patient on arrival has persistent coughing and oxygen level dropping in 89%-90%. Reports checking oxygen at home all week and have has readings in 80's. She is actively mildly wheezing. Report symptoms started URI symptoms and have progressively worsened. Taking otc medication without improvement of symptoms. Past Medical History:  Diagnosis Date  . Allergy   . Anemia   . Anxiety   . Arthritis    knees, multiple joints  . Asthma   . Bell palsy   . Blood transfusion without reported diagnosis   . Breast mass    lt breast mass x's 3 years increased in size  . Chronic kidney disease   . Chronic pain syndrome   . COPD (chronic obstructive pulmonary disease) ([place])   . Depression   . Diabetes mellitus   . Endometrial cancer ([place])   . Fibromyalgia   . GERD (gastroesophageal reflux disease)   . 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 Hyperlipidemia   . Hypertension   . Hypothyroidism   . Multiple pulmonary nodules 11/30/2012   Followed in Pulmonary clinic/  [facility]/ [place]  - See CT abd  10/29/12  New right lower lobe pulmonary nodularity, primarily ground-  glass in density. This could reflect an inflammatory process,  although follow-up is necessary to exclude atypical neoplasm. Full  chest CT should be considered to evaluate for other pulmonary  findings.     . Multiple thyroid nodules   . Nephropathy   . PONV (postoperative nausea and vomiting)   . Pulmonary nodules   . Thyroid disease   . Ulcer   . Uterine cancer ([place]) 12/26/2011   Stage 1, grade 1, S/P hysterectomy initially by robotic technique and then salpingo-oophorectomy at [facility] receiving no postoperative treatment.     Patient Active Problem List   Diagnosis Date Noted  . Bee sting allergy 07/07/2016  . Attention deficit disorder (ADD) in adult 07/07/2016  . Chronic insomnia 07/07/2016  . Vitamin D deficiency 07/07/2016  . Migraine syndrome 07/07/2016  . Osteoarthritis of both knees 07/07/2016  . Fibromyalgia syndrome 07/07/2016  . S/P gastric bypass 07/07/2016  . HLD (hyperlipidemia) 07/07/2016  . Morbid obesity due to excess calories ([place]) 03/10/2015  . Diabetes mellitus without complication ([place]) 36/62/9476  . Nontoxic multinodular goiter 03/09/2015  . Essential hypertension, benign 03/09/2015  . 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 IDA (iron deficiency anemia) 12/27/2013  . Ureteral calculus, left 11/17/2013  . SOB (shortness of breath) 11/30/2012    Past Surgical History:  Procedure Laterality Date  . ABDOMINAL HYSTERECTOMY     endometrial cancer  . CHOLECYSTECTOMY    . COLONOSCOPY WITH PROPOFOL N/A 01/19/2014   LYY:[id number]  . DILATION AND CURETTAGE OF UTERUS  12 yrs ago  . DILATION AND CURETTAGE OF UTERUS  06/18/2011   Procedure: DILATATION AND CURETTAGE;  Surgeon: Florian Buff, MD;  Location: [facility];  Service: Gynecology;  Laterality: N/A;  Suction Dilation and Curettage  . ESOPHAGOGASTRODUODENOSCOPY N/A 10/12/2013   Dr. Rourk:anastomotic ulcer likely cause of bleeding. likely ischemic   . ESOPHAGOGASTRODUODENOSCOPY (EGD) WITH PROPOFOL N/A 01/19/2014   [id number]  . EXTRACORPOREAL SHOCK WAVE LITHOTRIPSY  Left 01/11/2018   Procedure: LEFT EXTRACORPOREAL SHOCK WAVE LITHOTRIPSY (ESWL);  Surgeon: Lucas Mallow, MD;  Location: [facility];  Service: Urology;  Laterality: Left;  . EXTRACORPOREAL SHOCK WAVE LITHOTRIPSY Left 01/14/2018   Procedure: LEFT EXTRACORPOREAL SHOCK WAVE LITHOTRIPSY (ESWL);  Surgeon: Cleon Gustin, MD;  Location: [facility];  Service: Urology;  Laterality: Left;  75 MINS  W/ MAC  . GASTRIC BYPASS  2013   [facility]  . HYSTEROSCOPY WITH D & C  06/18/2011   Procedure:  DILATATION AND CURETTAGE /HYSTEROSCOPY;  Surgeon: Florian Buff, MD;  Location: [facility];  Service: Gynecology;  Laterality: N/A;  . LITHOTRIPSY    . paniculectomy    . TRIGGER FINGER RELEASE      OB History   No obstetric history on file.      Home Medications    Prior to Admission medications   Medication Sig Start Date End Date Taking? Authorizing Provider  albuterol (PROVENTIL HFA;VENTOLIN HFA) 108 (90 BASE) MCG/ACT inhaler Inhale 2 puffs into the lungs every 6 (six) hours as needed. For asthma     [provider]  ALPRAZolam Duanne Moron) 1 MG tablet Take 1 mg by mouth 3 (three) times daily as needed for anxiety or sleep. FOR ANXIETY AND SLEEP    [provider]  amphetamine-dextroamphetamine (ADDERALL) 10 MG tablet Take 10 mg by mouth daily. In the afternoon 10/23/17   [provider]  amphetamine-dextroamphetamine (ADDERALL) 20 MG tablet Take 40 mg by mouth every morning.  07/14/16   [provider]  azithromycin (ZITHROMAX) 250 MG tablet Take 1 tablet (250 mg total) by mouth daily. Take first 2 tablets together, then 1 every day until finished. 03/07/20   Wurst, [place], PA-C  benzonatate (TESSALON) 100 MG capsule Take 1 capsule (100 mg total) by mouth every 8 (eight) hours. 03/02/20   Wurst, [place], PA-C  buprenorphine (BUTRANS) 10 MCG/HR PTWK patch Place 10 mcg onto the skin once a week.    [provider]  buPROPion (WELLBUTRIN SR) 150 MG 12 hr tablet Take 150 mg by mouth daily.  10/03/17   [provider]  cefdinir (OMNICEF) 300 MG capsule Take 300 mg by mouth 2 (two) times daily.    [provider]  diphenhydrAMINE (BENADRYL) 25 mg capsule Take 25-75 mg by mouth See admin instructions. Taking 1 Capsule daily as needed for allergic reaction and takes 3 capsules at bedtime    [provider]  furosemide (LASIX) 20 MG tablet Take 20 mg by mouth daily.  02/15/14   [provider]  gabapentin (NEURONTIN) 600 MG  tablet Take 600-1,200 mg by mouth 3 (three) times daily as needed (for pain). 800 mg in am, 400 mg twice daily.     [provider]  ibuprofen (ADVIL,MOTRIN) 200 MG tablet Take 200-600 mg by mouth every 6 (six) hours as needed for headache or moderate pain.     [provider]  lamoTRIgine (LAMICTAL) 150 MG tablet Take 150 mg by mouth 2 (two) times daily.    [provider]  lidocaine (XYLOCAINE) 2 % jelly  09/22/17   [provider]  lisinopril (PRINIVIL,ZESTRIL) 40 MG tablet Take 40 mg by mouth daily.  03/27/14   [provider]  Melatonin 10 MG CAPS Take 1 capsule by mouth at bedtime.    [provider]  metoprolol succinate (TOPROL-XL) 50 MG 24 hr tablet Take 1 tablet by mouth daily. 12/10/16   [provider]  nitrofurantoin, macrocrystal-monohydrate, (  MACROBID) 100 MG capsule Take 1 capsule (100 mg total) by mouth 2 (two) times daily. 05/02/20   Faustino Congress, NP  nystatin (MYCOSTATIN) 100000 UNIT/ML suspension Take 5 mLs (500,000 Units total) by mouth 4 (four) times daily. 03/07/20   Wurst, [place], PA-C  ondansetron (ZOFRAN) 8 MG tablet Take 8 mg by mouth every 8 (eight) hours as needed for nausea or vomiting.  11/24/13   [provider]  pantoprazole (PROTONIX) 40 MG tablet TAKE (1) TABLET TWICE DAILY. 04/01/16   Annitta Needs, NP  phenazopyridine (PYRIDIUM) 200 MG tablet Take 1 tablet (200 mg total) by mouth 3 (three) times daily. 05/02/20   Faustino Congress, NP  predniSONE (STERAPRED UNI-PAK 21 TAB) 10 MG (21) TBPK tablet Take by mouth daily. Take 6 tabs by mouth daily  for 2 days, then 5 tabs for 2 days, then 4 tabs for 2 days, then 3 tabs for 2 days, 2 tabs for 2 days, then 1 tab by mouth daily for 2 days 03/02/20   Stacey Drain, [place], PA-C  simvastatin (ZOCOR) 20 MG tablet Take 20 mg by mouth daily.  05/29/16   [provider]  sitaGLIPtin (JANUVIA) 100 MG tablet Take 100 mg by mouth daily.    [provider]  SUMAtriptan (IMITREX) 100 MG tablet Take 1 tablet by mouth daily as needed for migraine.  07/12/16   [provider]  tiZANidine (ZANAFLEX) 4 MG tablet Take 4 mg by mouth at bedtime as needed for muscle spasms. For spasms.    [provider]  vortioxetine HBr (TRINTELLIX) 20 MG TABS Take 20 mg by mouth every other day.     [provider]    Family History Family History  Problem Relation Age of Onset  . Pneumonia Mother        Deceased  . Arthritis Mother   . Asthma Mother   . Cancer Mother        pancreatic  . COPD Mother   . Depression Mother   . Diabetes Mother   . Kidney disease Mother   . Liver cancer Father        Living  . Arthritis Father   . Cancer Father        liver, prostate  . COPD Father   . Depression Father   . Stroke Father   . Heart disease Maternal Grandmother   . Mental illness Maternal Grandmother   . Breast cancer Maternal Grandmother   . Alcohol abuse Paternal Grandfather   . Breast cancer Paternal Aunt   . Anesthesia problems Neg Hx   . Hypotension Neg Hx   . Malignant hyperthermia Neg Hx   . Pseudochol deficiency Neg Hx   . Colon cancer Neg Hx     Social History Social History   Tobacco Use  . Smoking status: Never Smoker  . Smokeless tobacco: Never Used  Vaping Use  . Vaping Use: Never used  Substance Use Topics  . Alcohol use: No    Alcohol/week: 0.0 standard drinks  . Drug use: No     Allergies   Bee venom, Cephalexin, Vancomycin, Equetro [carbamazepine], Adhesive [tape], Cefotaxime, Ciprofloxacin, Clindamycin, Latex, Penicillins, and Sulfa antibiotics   Review of Systems Review of Systems Pertinent negatives listed in HPI Physical Exam Triage Vital Signs ED Triage Vitals [07/30/20 1323]  Enc Vitals Group     BP (!) 147/83     Pulse Rate 71     Resp 19     Temp 99.8  F (37.7 C)     Temp Source Oral     SpO2 93 %     Weight      Height      Head Circumference      Peak Flow       Pain Score 0     Pain Loc      Pain Edu?      Excl. in [place]?    No data found.  Updated Vital Signs BP (!) 147/83 (BP Location: Right Arm)   Pulse 71   Temp 99.8 F (37.7 C) (Oral)   Resp 19   LMP 05/06/2011   SpO2 93%   Visual Acuity Right Eye Distance:   Left Eye Distance:   Bilateral Distance:    Right Eye Near:   Left Eye Near:    Bilateral Near:     Physical Exam Constitutional:      Appearance: She is obese. She is ill-appearing.  HENT:     Head: Normocephalic and atraumatic.     Nose: Congestion present.     Mouth/Throat:     Mouth: Mucous membranes are dry.  Cardiovascular:     Rate and Rhythm: Normal rate and regular rhythm.  Pulmonary:     Breath sounds: Wheezing and rhonchi present.  Abdominal:     General: Bowel sounds are normal. There is distension.  Musculoskeletal:     Cervical back: Normal range of motion.  Skin:    General: Skin is warm and dry.     Capillary Refill: Capillary refill takes less than 2 seconds.  Neurological:     General: No focal deficit present.     Mental Status: She is oriented to person, place, and time. Mental status is at baseline.  Psychiatric:        Mood and Affect: Mood normal.        Behavior: Behavior normal.        Thought Content: Thought content normal.        Judgment: Judgment normal.     UC Treatments / Results  Labs (all labs ordered are listed, but only abnormal results are displayed) Labs Reviewed - No data to display  EKG   Radiology DG Chest 2 View  Result Date: 07/30/2020 CLINICAL DATA:  Cough for 2 weeks with chest tightness. History of COPD. EXAM: CHEST - 2 VIEW COMPARISON:  July 14, 2017. FINDINGS: Borderline enlargement the cardiac silhouette. Mild diffuse interstitial prominence. No focal consolidation. No visible pleural effusions or pneumothorax. No acute osseous abnormality. Cholecystectomy clips. IMPRESSION: Mild diffuse interstitial prominence, which could represent mild  interstitial edema or atypical infection in the correct clinical setting. No confluent consolidation. Electronically Signed   By: Margaretha Sheffield MD   On: 07/30/2020 14:12    Procedures Procedures (including critical care time)  Medications Ordered in UC Medications - No data to display  Initial Impression / Assessment and Plan / UC Course  I have reviewed the triage vital signs and the nursing notes.  Pertinent labs & imaging results that were available during my care of the patient were reviewed by me and considered in my medical decision making (see chart for details).    Chest x-ray revealed Insterstitial edema vs atypical pneumonia. Given clinical findings and noted findings on chest xray , treating for suspected pneumonia. Given numerous antibiotic allergies, covering with Azithromycin. To improve inflammation of lungs improve WOB start prednisone and albuterol inhaler given in clinic. Red flag precautions given that warrant immediate evaluation  in the setting of the emergency department.     Final Clinical Impressions(s) / UC Diagnoses   Final diagnoses:  Shortness of breath  Wheezing  Cough, persistent  Lower respiratory infection (e.g., bronchitis, pneumonia, pneumonitis, pulmonitis)     Discharge Instructions     Treating for early possible pneumonia given findings of chest xray. If you breathing worsens or your symptoms do not improve with current treatment and or your oxygen level remains consistently less than 90, go immediately to nearest Emergency Department.    ED Prescriptions    Medication Sig Dispense Auth. Provider   azithromycin (ZITHROMAX) 250 MG tablet Take 2 tabs PO x 1 dose, then 1 tab PO QD x 4 days 6 tablet Scot Jun, FNP   predniSONE (DELTASONE) 20 MG tablet Take 1 tablet (20 mg total) by mouth daily with breakfast. 10 tablet Scot Jun, FNP   benzonatate (TESSALON) 100 MG capsule Take 1-2 capsules (100-200 mg total) by mouth 3  (three) times daily as needed for cough. 40 capsule Scot Jun, FNP     PDMP not reviewed this encounter.   Scot Jun, FNP 08/01/20 0040

## 2020-08-08 ENCOUNTER — Telehealth: Payer: Self-pay | Admitting: Emergency Medicine

## 2020-08-08 MED ORDER — NYSTATIN 100000 UNIT/ML MT SUSP: 500000.0000 [IU] | Freq: Four times a day (QID) | OROMUCOSAL | 0 refills | Status: DC

## 2020-12-13 ENCOUNTER — Ambulatory Visit: Admission: EM | Admit: 2020-12-13 | Discharge: 2020-12-13 | Discharge status: Absent without leave | Payer: 59

## 2020-12-13 ENCOUNTER — Other Ambulatory Visit: Payer: Self-pay

## 2021-05-02 ENCOUNTER — Encounter (HOSPITAL_COMMUNITY): Payer: Self-pay | Admitting: Hematology and Oncology

## 2021-05-21 ENCOUNTER — Ambulatory Visit (INDEPENDENT_AMBULATORY_CARE_PROVIDER_SITE_OTHER): Payer: 59

## 2021-05-21 ENCOUNTER — Encounter: Payer: Self-pay | Admitting: Orthopaedic Surgery

## 2021-05-21 ENCOUNTER — Ambulatory Visit (INDEPENDENT_AMBULATORY_CARE_PROVIDER_SITE_OTHER): Payer: 59 | Admitting: Orthopaedic Surgery

## 2021-05-21 ENCOUNTER — Encounter (HOSPITAL_COMMUNITY): Payer: Self-pay | Admitting: Hematology and Oncology

## 2021-05-21 VITALS — Ht 64.0 in | Wt 258.0 lb

## 2021-05-21 DIAGNOSIS — G8929 Other chronic pain: Secondary | ICD-10-CM | POA: Diagnosis not present

## 2021-05-21 DIAGNOSIS — M25512 Pain in left shoulder: Secondary | ICD-10-CM | POA: Diagnosis not present

## 2021-05-21 NOTE — Progress Notes (Signed)
Office Visit Note   Patient: Molly Berry           Date of Birth: [birth year]           MRN: [id number] Visit Date: 05/21/2021              Requested by: Nicholes Rough, PA-C [place],  [place] [address] PCP: Nicholes Rough, PA-C   Assessment & Plan: Visit Diagnoses:  1. Chronic left shoulder pain     Plan: Pleasant 56 year old woman with a 60-monthhistory of left shoulder pain.  She denies any injuries.  She has had similar problems on the right and done well with an injection.  Unfortunately she had a injection by another provider 3 weeks ago did not provide her even diagnostic relief.  She has difficulty going overhead and going behind her back because of pain.  She has fibromyalgia and is managed by chronic pain management.  Her pain medication for her fibromyalgia has not even come close to helping with her pain.  She awakens crying.  Given the length of time and failure of an injection and pain medication we recommend an MRI with follow-up afterwards  Follow-Up Instructions: No follow-ups on file.   Orders:  Orders Placed This Encounter  Procedures   XR Shoulder Left   No orders of the defined types were placed in this encounter.     Procedures: No procedures performed   Clinical Data: No additional findings.   Subjective: Chief Complaint  Patient presents with   Left Shoulder - Pain  Patient presents today for left shoulder pain. She said that it has been hurting for a month. Her pain is located at the proximal left humerus. She said that it hurts constantly. [facility] weeks [facility], but no relief. Her insurance has changed and therefore had to find a new doctor in network. She is taking Percocet 10/325 for pain relief. She is right hand dominant.     Review of Systems  All other systems reviewed and are negative.   Objective: Vital Signs: LMP 05/06/2011   Physical Exam Constitutional:       Appearance: Normal appearance.  Pulmonary:     Effort: Pulmonary effort is normal.  Neurological:     Mental Status: She is alert.  Psychiatric:        Mood and Affect: Mood normal.        Behavior: Behavior normal.    Ortho Exam Examination she is sitting in a chair appears comfortable.  She has tenderness in the shoulder that radiates into her upper arm with resisted external rotation.  She can only go to her side and cannot go behind her back because of pain she is able to fully elevate her arm but has to do so in a specific manner to avoid pain.  Her strength is intact but painful no neck pain Specialty Comments:  No specialty comments available.  Imaging: No results found.   PMFS History: Patient Active Problem List   Diagnosis Date Noted   Bee sting allergy 07/07/2016   Attention deficit disorder (ADD) in adult 07/07/2016   Chronic insomnia 07/07/2016   Vitamin D deficiency 07/07/2016   Migraine syndrome 07/07/2016   Osteoarthritis of both knees 07/07/2016   Fibromyalgia syndrome 07/07/2016   S/P gastric bypass 07/07/2016   HLD (hyperlipidemia) 07/07/2016   Morbid obesity due to excess calories ([place] 03/10/2015   Diabetes mellitus without  complication ([place]) 02/26/8526   Nontoxic multinodular goiter 03/09/2015   Essential hypertension, benign 03/09/2015   H/O gastric bypass 05/09/2014   IDA (iron deficiency anemia) 12/27/2013   Ureteral calculus, left 11/17/2013   SOB (shortness of breath) 11/30/2012   Past Medical History:  Diagnosis Date   Allergy    Anemia    Anxiety    Arthritis    knees, multiple joints   Asthma    Bell palsy    Blood transfusion without reported diagnosis    Breast mass    lt breast mass x's 3 years increased in size   Chronic kidney disease    Chronic pain syndrome    COPD (chronic obstructive pulmonary disease) ([facility])    Depression    Diabetes mellitus    Endometrial cancer ([facility])    Fibromyalgia    GERD (gastroesophageal reflux  disease)    H/O gastric bypass 05/09/2014   At [facility]   Hyperlipidemia    Hypertension    Hypothyroidism    Multiple pulmonary nodules 11/30/2012   Followed in Pulmonary clinic/ [facility]/ [facility]  - See CT abd  10/29/12  New right lower lobe pulmonary nodularity, primarily ground-  glass in density. This could reflect an inflammatory process,  although follow-up is necessary to exclude atypical neoplasm. Full  chest CT should be considered to evaluate for other pulmonary  findings.      Multiple thyroid nodules    Nephropathy    PONV (postoperative nausea and vomiting)    Pulmonary nodules    Thyroid disease    Ulcer    Uterine cancer ([place]) 12/26/2011   Stage 1, grade 1, S/P hysterectomy initially by robotic technique and then salpingo-oophorectomy at [facility] receiving no postoperative treatment.    Family History  Problem Relation Age of Onset   Pneumonia Mother        Deceased   Arthritis Mother    Asthma Mother    Cancer Mother        pancreatic   COPD Mother    Depression Mother    Diabetes Mother    Kidney disease Mother    Liver cancer Father        Living   Arthritis Father    Cancer Father        liver, prostate   COPD Father    Depression Father    Stroke Father    Heart disease Maternal Grandmother    Mental illness Maternal Grandmother    Breast cancer Maternal Grandmother    Alcohol abuse Paternal Grandfather    Breast cancer Paternal Aunt    Anesthesia problems Neg Hx    Hypotension Neg Hx    Malignant hyperthermia Neg Hx    Pseudochol deficiency Neg Hx    Colon cancer Neg Hx     Past Surgical History:  Procedure Laterality Date   ABDOMINAL HYSTERECTOMY     endometrial cancer   CHOLECYSTECTOMY     COLONOSCOPY WITH PROPOFOL N/A 01/19/2014   POE:[id number]   DILATION AND CURETTAGE OF UTERUS  12 yrs ago   DILATION AND CURETTAGE OF UTERUS  06/18/2011   Procedure: DILATATION AND CURETTAGE;  Surgeon: Florian Buff, MD;  Location: [facility];  Service:  Gynecology;  Laterality: N/A;  Suction Dilation and Curettage   ESOPHAGOGASTRODUODENOSCOPY N/A 10/12/2013   Dr. Rourk:anastomotic ulcer likely cause of bleeding. likely ischemic    ESOPHAGOGASTRODUODENOSCOPY (EGD) WITH PROPOFOL N/A 01/19/2014   VQM:[id number]   EXTRACORPOREAL SHOCK WAVE LITHOTRIPSY Left 01/11/2018  Procedure: LEFT EXTRACORPOREAL SHOCK WAVE LITHOTRIPSY (ESWL);  Surgeon: Lucas Mallow, MD;  Location: [facility];  Service: Urology;  Laterality: Left;   EXTRACORPOREAL SHOCK WAVE LITHOTRIPSY Left 01/14/2018   Procedure: LEFT EXTRACORPOREAL SHOCK WAVE LITHOTRIPSY (ESWL);  Surgeon: Cleon Gustin, MD;  Location: [facility];  Service: Urology;  Laterality: Left;  67 MINS  W/ MAC   GASTRIC BYPASS  2013   [facility]   HYSTEROSCOPY WITH D & C  06/18/2011   Procedure: DILATATION AND CURETTAGE /HYSTEROSCOPY;  Surgeon: Florian Buff, MD;  Location: [facility];  Service: Gynecology;  Laterality: N/A;   LITHOTRIPSY     paniculectomy     TRIGGER FINGER RELEASE     Social History   Occupational History   Occupation: Event organiser employed)    Employer: NOT EMPLOYED  Tobacco Use   Smoking status: Never   Smokeless tobacco: Never  Vaping Use   Vaping Use: Never used  Substance and Sexual Activity   Alcohol use: No    Alcohol/week: 0.0 standard drinks   Drug use: No   Sexual activity: Yes    Birth control/protection: None, Surgical

## 2021-05-23 ENCOUNTER — Ambulatory Visit: Payer: Self-pay | Admitting: Orthopaedic Surgery

## 2021-06-06 ENCOUNTER — Ambulatory Visit (HOSPITAL_COMMUNITY): Payer: 59

## 2021-06-07 ENCOUNTER — Other Ambulatory Visit: Payer: Self-pay

## 2021-06-07 ENCOUNTER — Ambulatory Visit (HOSPITAL_COMMUNITY)
Admission: RE | Admit: 2021-06-07 | Discharge: 2021-06-07 | Disposition: A | Discharge status: Home / Self Care | Payer: 59 | Source: Ambulatory Visit | Attending: Orthopaedic Surgery | Admitting: Orthopaedic Surgery

## 2021-06-07 ENCOUNTER — Encounter (HOSPITAL_COMMUNITY): Payer: Self-pay | Admitting: Hematology and Oncology

## 2021-06-07 DIAGNOSIS — M25512 Pain in left shoulder: Secondary | ICD-10-CM | POA: Insufficient documentation

## 2021-06-07 DIAGNOSIS — G8929 Other chronic pain: Secondary | ICD-10-CM | POA: Insufficient documentation

## 2021-06-11 ENCOUNTER — Encounter: Payer: Self-pay | Admitting: Orthopaedic Surgery

## 2021-06-11 ENCOUNTER — Other Ambulatory Visit: Payer: Self-pay

## 2021-06-11 ENCOUNTER — Ambulatory Visit (INDEPENDENT_AMBULATORY_CARE_PROVIDER_SITE_OTHER): Payer: 59 | Admitting: Orthopaedic Surgery

## 2021-06-11 DIAGNOSIS — M25512 Pain in left shoulder: Secondary | ICD-10-CM

## 2021-06-11 DIAGNOSIS — G8929 Other chronic pain: Secondary | ICD-10-CM | POA: Diagnosis not present

## 2021-06-11 NOTE — Progress Notes (Addendum)
Office Visit Note   Patient: Molly Berry           Date of Birth: 09-13-1965           MRN: [id number] Visit Date: 06/11/2021              Requested by: Nicholes Rough, PA-C [place],  [place] [address] PCP: Nicholes Rough, PA-C   Assessment & Plan: Visit Diagnoses: Left shoulder rotator cuff tear  Plan: Ms. Sobh is here for review of her MRI of her left shoulder.  She has had ongoing difficulty pain and loss of some motion in her left shoulder for 12 weeks.  An MRI was ordered.  Her shoulder feels about the same.  MRI was reviewed for her.  She has a large full-thickness near complete tear of the supraspinatus with 22 mm of retraction and a few intact posterior fibers.  She also has some moderate tendinosis of the intra-articular portion of the long head of the biceps.  She still has some good muscle strength which is encouraging.  An extensive discussion was had with her regards to her options.  We have recommended surgical repair with possibly a patch.  Risks expectations outcomes and recovery were reviewed with her.  She would like to go forward with surgery.  May do this at her convenience  Follow-Up Instructions: No follow-ups on file.   Orders:  No orders of the defined types were placed in this encounter.  No orders of the defined types were placed in this encounter.     Procedures: No procedures performed   Clinical Data: No additional findings.   Subjective: Chief Complaint  Patient presents with   Left Shoulder - Follow-up    MRI review  Patient presents today for follow up on her left shoulder. She had an MRI and is here today for those results.  HPI     Objective: Vital Signs: LMP 05/06/2011   Physical Exam Constitutional:      Appearance: Normal appearance.  Pulmonary:     Effort: Pulmonary effort is normal.  Skin:    General: Skin is warm and dry.  Neurological:     Mental Status: She is alert.    Ortho Exam Examination of  her left shoulder distally neurovascularly intact she has actually fairly good motion can slowly bring her arm to extension.  She has good resisted internal/external rotation strength though painful tender over this supraspinatus Specialty Comments:  No specialty comments available.  Imaging: No results found.   PMFS History: Patient Active Problem List   Diagnosis Date Noted   Bee sting allergy 07/07/2016   Attention deficit disorder (ADD) in adult 07/07/2016   Chronic insomnia 07/07/2016   Vitamin D deficiency 07/07/2016   Migraine syndrome 07/07/2016   Osteoarthritis of both knees 07/07/2016   Fibromyalgia syndrome 07/07/2016   S/P gastric bypass 07/07/2016   HLD (hyperlipidemia) 07/07/2016   Morbid obesity due to excess calories ([place]) 03/10/2015   Diabetes mellitus without complication ([place]) 33/82/5053   Nontoxic multinodular goiter 03/09/2015   Essential hypertension, benign 03/09/2015   H/O gastric bypass 05/09/2014   IDA (iron deficiency anemia) 12/27/2013   Ureteral calculus, left 11/17/2013   SOB (shortness of breath) 11/30/2012   Past Medical History:  Diagnosis Date   Allergy    Anemia    Anxiety    Arthritis    knees, multiple joints   Asthma    Bell palsy    Blood transfusion without  reported diagnosis    Breast mass    lt breast mass x's 3 years increased in size   Chronic kidney disease    Chronic pain syndrome    COPD (chronic obstructive pulmonary disease) (HCC)    Depression    Diabetes mellitus    Endometrial cancer (HCC)    Fibromyalgia    GERD (gastroesophageal reflux disease)    H/O gastric bypass 05/09/2014   At [facility]   Hyperlipidemia    Hypertension    Hypothyroidism    Multiple pulmonary nodules 11/30/2012   Followed in Pulmonary clinic/ [facility]/ [place]  - See CT abd  10/29/12  New right lower lobe pulmonary nodularity, primarily ground-  glass in density. This could reflect an inflammatory process,  although follow-up is necessary  to exclude atypical neoplasm. Full  chest CT should be considered to evaluate for other pulmonary  findings.      Multiple thyroid nodules    Nephropathy    PONV (postoperative nausea and vomiting)    Pulmonary nodules    Thyroid disease    Ulcer    Uterine cancer ([place]) 12/26/2011   Stage 1, grade 1, S/P hysterectomy initially by robotic technique and then [facility]oophorectomy at [facility] receiving no postoperative treatment.    Family History  Problem Relation Age of Onset   Pneumonia Mother        Deceased   Arthritis Mother    Asthma Mother    Cancer Mother        pancreatic   COPD Mother    Depression Mother    Diabetes Mother    Kidney disease Mother    Liver cancer Father        Living   Arthritis Father    Cancer Father        liver, prostate   COPD Father    Depression Father    Stroke Father    Heart disease Maternal Grandmother    Mental illness Maternal Grandmother    Breast cancer Maternal Grandmother    Alcohol abuse Paternal Grandfather    Breast cancer Paternal Aunt    Anesthesia problems Neg Hx    Hypotension Neg Hx    Malignant hyperthermia Neg Hx    Pseudochol deficiency Neg Hx    Colon cancer Neg Hx     Past Surgical History:  Procedure Laterality Date   ABDOMINAL HYSTERECTOMY     endometrial cancer   CHOLECYSTECTOMY     COLONOSCOPY WITH PROPOFOL N/A 01/19/2014   OMV:[id number]   DILATION AND CURETTAGE OF UTERUS  12 yrs ago   DILATION AND CURETTAGE OF UTERUS  06/18/2011   Procedure: DILATATION AND CURETTAGE;  Surgeon: Florian Buff, MD;  Location: [facility];  Service: Gynecology;  Laterality: N/A;  Suction Dilation and Curettage   ESOPHAGOGASTRODUODENOSCOPY N/A 10/12/2013   Dr. Rourk:anastomotic ulcer likely cause of bleeding. likely ischemic    ESOPHAGOGASTRODUODENOSCOPY (EGD) WITH PROPOFOL N/A 01/19/2014   EZM:[id number]   EXTRACORPOREAL SHOCK WAVE LITHOTRIPSY Left 01/11/2018   Procedure: LEFT EXTRACORPOREAL SHOCK WAVE LITHOTRIPSY (ESWL);  Surgeon: Lucas Mallow, MD;  Location: [facility];  Service: Urology;  Laterality: Left;   EXTRACORPOREAL SHOCK WAVE LITHOTRIPSY Left 01/14/2018   Procedure: LEFT EXTRACORPOREAL SHOCK WAVE LITHOTRIPSY (ESWL);  Surgeon: Cleon Gustin, MD;  Location: [facility];  Service: Urology;  Laterality: Left;  25 MINS  W/ MAC   GASTRIC BYPASS  2013   [facility]   HYSTEROSCOPY WITH D & C  06/18/2011  Procedure: DILATATION AND CURETTAGE /HYSTEROSCOPY;  Surgeon: Florian Buff, MD;  Location: [facility];  Service: Gynecology;  Laterality: N/A;   LITHOTRIPSY     paniculectomy     TRIGGER FINGER RELEASE     Social History   Occupational History   Occupation: Event organiser employed)    Employer: NOT EMPLOYED  Tobacco Use   Smoking status: Never   Smokeless tobacco: Never  Vaping Use   Vaping Use: Never used  Substance and Sexual Activity   Alcohol use: No    Alcohol/week: 0.0 standard drinks   Drug use: No   Sexual activity: Yes    Birth control/protection: None, Surgical

## 2021-06-13 ENCOUNTER — Encounter (HOSPITAL_COMMUNITY): Payer: Self-pay | Admitting: Hematology and Oncology

## 2021-07-01 NOTE — H&P (Signed)
Molly Berry is an 56 y.o. female.   Chief Complaint: Left shoulder pain HPI: Molly Berry is here for review of her MRI of her left shoulder.  She has had ongoing difficulty pain and loss of some motion in her left shoulder for 12 weeks.  An MRI was ordered.  Her shoulder feels about the same.  MRI was reviewed for her.  Past Medical History:  Diagnosis Date   Allergy    Anemia    Anxiety    Arthritis    knees, multiple joints   Asthma    Bell palsy    Blood transfusion without reported diagnosis    Breast mass    lt breast mass x's 3 years increased in size   Chronic kidney disease    Chronic pain syndrome    COPD (chronic obstructive pulmonary disease) (HCC)    Depression    Diabetes mellitus    Endometrial cancer (HCC)    Fibromyalgia    GERD (gastroesophageal reflux disease)    H/O gastric bypass 05/09/2014   At [facility]   Hyperlipidemia    Hypertension    Hypothyroidism    Multiple pulmonary nodules 11/30/2012   Followed in Pulmonary clinic/ [facility]/ [place]  - See CT abd  10/29/12  New right lower lobe pulmonary nodularity, primarily ground-  glass in density. This could reflect an inflammatory process,  although follow-up is necessary to exclude atypical neoplasm. Full  chest CT should be considered to evaluate for other pulmonary  findings.      Multiple thyroid nodules    Nephropathy    PONV (postoperative nausea and vomiting)    Pulmonary nodules    Thyroid disease    Ulcer    Uterine cancer ([place]) 12/26/2011   Stage 1, grade 1, S/P hysterectomy initially by robotic technique and then [facility]ping[facility]ooph[facility]omy at [facility] receiving no postoperative treatment.    Past Surgical History:  Procedure Laterality Date   ABDOMINAL HYSTERECTOMY     endometrial cancer   CHOLECYSTECTOMY     COLONOSCOPY WITH PROPOFOL N/A 01/19/2014   EYC:[id number]   DILATION AND CURETTAGE OF UTERUS  12 yrs ago   DILATION AND CURETTAGE OF UTERUS  06/18/2011   Procedure: DILATATION AND  CURETTAGE;  Surgeon: Florian Buff, MD;  Location: [facility];  Service: Gynecology;  Laterality: N/A;  Suction Dilation and Curettage   ESOPHAGOGASTRODUODENOSCOPY N/A 10/12/2013   Dr. Rourk:anastomotic ulcer likely cause of bleeding. likely ischemic    ESOPHAGOGASTRODUODENOSCOPY (EGD) WITH PROPOFOL N/A 01/19/2014   [id number]   EXTRACORPOREAL SHOCK WAVE LITHOTRIPSY Left 01/11/2018   Procedure: LEFT EXTRACORPOREAL SHOCK WAVE LITHOTRIPSY (ESWL);  Surgeon: Lucas Mallow, MD;  Location[facility];  Service: Urology;  Laterality: Left;   EXTRACORPOREAL SHOCK WAVE LITHOTRIPSY Left 01/14/2018   Procedure: LEFT EXTRACORPOREAL SHOCK WAVE LITHOTRIPSY (ESWL);  Surgeon: Cleon Gustin, MD;  Location: [facility];  Service: Urology;  Laterality: Left;  63 MINS  W/ MAC   GASTRIC BYPASS  2013   [facility]   HYSTEROSCOPY WITH D & C  06/18/2011   Procedure: DILATATION AND CURETTAGE /HYSTEROSCOPY;  Surgeon: Florian Buff, MD;  Location: [facility];  Service: Gynecology;  Laterality: N/A;   LITHOTRIPSY     paniculectomy     TRIGGER FINGER RELEASE      Family History  Problem Relation Age of Onset   Pneumonia Mother        Deceased   Arthritis Mother    Asthma Mother    Cancer  Mother        pancreatic   COPD Mother    Depression Mother    Diabetes Mother    Kidney disease Mother    Liver cancer Father        Living   Arthritis Father    Cancer Father        liver, prostate   COPD Father    Depression Father    Stroke Father    Heart disease Maternal Grandmother    Mental illness Maternal Grandmother    Breast cancer Maternal Grandmother    Alcohol abuse Paternal Grandfather    Breast cancer Paternal Aunt    Anesthesia problems Neg Hx    Hypotension Neg Hx    Malignant hyperthermia Neg Hx    Pseudochol deficiency Neg Hx    Colon cancer Neg Hx    Social History:  reports that she has never smoked. She has never used smokeless tobacco. She reports that she does not drink alcohol and does not use  drugs.  Allergies:  Allergies  Allergen Reactions   Bee Venom Shortness Of Breath, Swelling and Rash   Cephalexin Anaphylaxis   Vancomycin Anaphylaxis and Hives    Has taken for bronchitis under medical supervision on site with benadryl   Equetro [Carbamazepine]     Loss of balance, trembling, inability to think, resulting in ER visit shortly after start of course    Adhesive [Tape] Rash   Cefotaxime Rash   Ciprofloxacin Hives and Rash   Clindamycin Rash   Latex Rash   Penicillins Hives and Rash    Has patient had a PCN reaction causing immediate rash, facial/tongue/throat swelling, SOB or lightheadedness with hypotension: Unknown Has patient had a PCN reaction causing severe rash involving mucus membranes or skin necrosis: Unknown Has patient had a PCN reaction that required hospitalization: Unknown Has patient had a PCN reaction occurring within the last 10 years: No If all of the above answers are "NO", then may proceed with Cephalosporin use.   Sulfa Antibiotics Rash    No medications prior to admission.    No results found for this or any previous visit (from the past 48 hour(s)). No results found.  Review of Systems  All other systems reviewed and are negative.  Last menstrual period 05/06/2011. Physical Exam  Constitutional:      Appearance: Normal appearance.  Pulmonary:     Effort: Pulmonary effort is normal.  Skin:    General: Skin is warm and dry.  Neurological:     Mental Status: She is alert.     Lungs clear Heart RRR Ortho Exam Examination of her left shoulder distally neurovascularly intact she has actually fairly good motion can slowly bring her arm to extension.  She has good resisted internal/external rotation strength though painful tender over this supraspinatus Assessment/Plan  Molly Berry is here for review of her MRI of her left shoulder.  She has had ongoing difficulty pain and loss of some motion in her left shoulder for 12 weeks.  An MRI was  ordered.  Her shoulder feels about the same.  MRI was reviewed for her.  She has a large full-thickness near complete tear of the supraspinatus with 22 mm of retraction and a few intact posterior fibers.  She also has some moderate tendinosis of the intra-articular portion of the long head of the biceps.  She still has some good muscle strength which is encouraging.  An extensive discussion was had with her regards to her  options.  We have recommended surgical repair with possibly a patch.  Risks expectations outcomes and recovery were reviewed with her.  She would like to go forward with surgery.  May do this at her [place], [place] 07/01/2021, 8:56 AM

## 2021-07-08 NOTE — Patient Instructions (Addendum)
DUE TO COVID-19 ONLY ONE VISITOR IS ALLOWED TO COME WITH YOU AND STAY IN THE WAITING ROOM ONLY DURING PRE OP AND PROCEDURE.   **NO VISITORS ARE ALLOWED IN THE SHORT STAY AREA OR RECOVERY ROOM!!**   You are not required to quarantine, however you are required to wear a well-fitted mask when you are out and around people not in your household.  Hand Hygiene often Do NOT share personal items Notify your provider if you are in close contact with someone who has COVID or you develop fever 100.4 or greater, new onset of sneezing, cough, sore throat, shortness of breath or body aches.        Your procedure is scheduled on: Tuesday, 07-16-21   Report to [facility] Main Entrance    Report to admitting at 5:15 AM   Call this number if you have problems the morning of surgery [phone]   Do not eat food :After Midnight.   After Midnight you may have the following liquids until 4:15 AM DAY OF SURGERY  Water Black Coffee (sugar ok, NO MILK/CREAM OR CREAMERS)  Tea (sugar ok, NO MILK/CREAM OR CREAMERS) regular and decaf                             Plain Jell-O (NO RED)                                           Fruit ices (not with fruit pulp, NO RED)                                     Popsicles (NO RED)                                                                  Juice: apple, WHITE grape, WHITE cranberry Sports drinks like Gatorade (NO RED) Clear broth(vegetable,chicken,beef)               Drink 1 G2 drink at 4:15 AM the morning of surgery      The day of surgery:  Drink ONE (1) Pre-Surgery G2 at 4:15 AM the morning of surgery. Drink in one sitting. Do not sip.  This drink was given to you during your hospital  pre-op appointment visit. Nothing else to drink after completing the Pre-Surgery G2.          If you have questions, please contact your surgeons office.   FOLLOW  ANY ADDITIONAL PRE OP INSTRUCTIONS YOU RECEIVED FROM YOUR SURGEON'S OFFICE!!!     Oral Hygiene  is also important to reduce your risk of infection.                                    Remember - BRUSH YOUR TEETH THE MORNING OF SURGERY WITH YOUR REGULAR TOOTHPASTE   Do NOT smoke after Midnight   Take these medicines the morning of surgery with A SIP OF WATER:  Alprazolam, Bupropion, Pepcid, Gabapentin,  Lamotrigine, Metoprolol, Simvastatin, Vortioxetine.  Okay to use inhaler  How to Manage Your Diabetes Before and After Surgery  Why is it important to control my blood sugar before and after surgery? Improving blood sugar levels before and after surgery helps healing and can limit problems. A way of improving blood sugar control is eating a healthy diet by:  Eating less sugar and carbohydrates  Increasing activity/exercise  Talking with your doctor about reaching your blood sugar goals High blood sugars (greater than 180 mg/dL) can raise your risk of infections and slow your recovery, so you will need to focus on controlling your diabetes during the weeks before surgery. Make sure that the doctor who takes care of your diabetes knows about your planned surgery including the date and location.  How do I manage my blood sugar before surgery? Check your blood sugar at least 4 times a day, starting 2 days before surgery, to make sure that the level is not too high or low. Check your blood sugar the morning of your surgery when you wake up and every 2 hours until you get to the Short Stay unit. If your blood sugar is less than 70 mg/dL, you will need to treat for low blood sugar: Do not take insulin. Treat a low blood sugar (less than 70 mg/dL) with  cup of clear juice (cranberry or apple), 4 glucose tablets, OR glucose gel. Recheck blood sugar in 15 minutes after treatment (to make sure it is greater than 70 mg/dL). If your blood sugar is not greater than 70 mg/dL on recheck, call [phone] for further instructions. Report your blood sugar to the short stay nurse when you get to Short  Stay.  If you are admitted to the hospital after surgery: Your blood sugar will be checked by the staff and you will probably be given insulin after surgery (instead of oral diabetes medicines) to make sure you have good blood sugar levels. The goal for blood sugar control after surgery is 80-180 mg/dL.   WHAT DO I DO ABOUT MY DIABETES MEDICATION?  Do not take oral diabetes medicines (pills) the morning of surgery.  THE DAY BEFORE SURGERY:  Take Januvia as prescribed.       THE MORNING OF SURGERY:  Do not take Januvia.  Reviewed and Endorsed by [facility] Patient Education Committee, August 2015                               You may not have any metal on your body including hair pins, jewelry, and body piercing             Do not wear make-up, lotions, powders, perfumes or deodorant  Do not wear nail polish including gel and S&S, artificial/acrylic nails, or any other type of covering on natural nails including finger and toenails. If you have artificial nails, gel coating, etc. that needs to be removed by a nail salon please have this removed prior to surgery or surgery may need to be canceled/ delayed if the surgeon/ anesthesia feels like they are unable to be safely monitored.   Do not shave  48 hours prior to surgery.    Do not bring valuables to the hospital. [place].   Contacts, dentures or bridgework may not be worn into surgery.   Patients discharged on the day of surgery will not be allowed to drive home.  Someone  NEEDS to stay with you for the first 24 hours after anesthesia.  Special Instructions: Bring a copy of your healthcare power of attorney and living will documents         the day of surgery if you haven't scanned them before.  Please read over the following fact sheets you were given: IF YOU HAVE QUESTIONS ABOUT YOUR PRE-OP INSTRUCTIONS PLEASE CALL [place] - Preparing for Surgery Before surgery, you  can play an important role.  Because skin is not sterile, your skin needs to be as free of germs as possible.  You can reduce the number of germs on your skin by washing with CHG (chlorahexidine gluconate) soap before surgery.  CHG is an antiseptic cleaner which kills germs and bonds with the skin to continue killing germs even after washing. Please DO NOT use if you have an allergy to CHG or antibacterial soaps.  If your skin becomes reddened/irritated stop using the CHG and inform your nurse when you arrive at Short Stay. Do not shave (including legs and underarms) for at least 48 hours prior to the first CHG shower.  You may shave your face/neck.  Please follow these instructions carefully:  1.  Shower with CHG Soap the night before surgery and the  morning of surgery.  2.  If you choose to wash your hair, wash your hair first as usual with your normal  shampoo.  3.  After you shampoo, rinse your hair and body thoroughly to remove the shampoo.                             4.  Use CHG as you would any other liquid soap.  You can apply chg directly to the skin and wash.  Gently with a scrungie or clean washcloth.  5.  Apply the CHG Soap to your body ONLY FROM THE NECK DOWN.   Do   not use on face/ open                           Wound or open sores. Avoid contact with eyes, ears mouth and   genitals (private parts).                       Wash face,  Genitals (private parts) with your normal soap.             6.  Wash thoroughly, paying special attention to the area where your    surgery  will be performed.  7.  Thoroughly rinse your body with warm water from the neck down.  8.  DO NOT shower/wash with your normal soap after using and rinsing off the CHG Soap.                9.  Pat yourself dry with a clean towel.            10.  Wear clean pajamas.            11.  Place clean sheets on your bed the night of your first shower and do not  sleep with pets. Day of Surgery : Do not apply any  lotions/deodorants the morning of surgery.  Please wear clean clothes to the hospital/surgery center.  FAILURE TO FOLLOW THESE INSTRUCTIONS MAY RESULT IN THE CANCELLATION OF YOUR SURGERY  PATIENT SIGNATURE_________________________________  NURSE SIGNATURE__________________________________  ________________________________________________________________________   Adam Phenix  An incentive spirometer is a tool that can help keep your lungs clear and active. This tool measures how well you are filling your lungs with each breath. Taking long deep breaths may help reverse or decrease the chance of developing breathing (pulmonary) problems (especially infection) following: A long period of time when you are unable to move or be active. BEFORE THE PROCEDURE  If the spirometer includes an indicator to show your best effort, your nurse or respiratory therapist will set it to a desired goal. If possible, sit up straight or lean slightly forward. Try not to slouch. Hold the incentive spirometer in an upright position. INSTRUCTIONS FOR USE  Sit on the edge of your bed if possible, or sit up as far as you can in bed or on a chair. Hold the incentive spirometer in an upright position. Breathe out normally. Place the mouthpiece in your mouth and seal your lips tightly around it. Breathe in slowly and as deeply as possible, raising the piston or the ball toward the top of the column. Hold your breath for 3-5 seconds or for as long as possible. Allow the piston or ball to fall to the bottom of the column. Remove the mouthpiece from your mouth and breathe out normally. Rest for a few seconds and repeat Steps 1 through 7 at least 10 times every 1-2 hours when you are awake. Take your time and take a few normal breaths between deep breaths. The spirometer may include an indicator to show your best effort. Use the indicator as a goal to work toward during each repetition. After each set of 10 deep  breaths, practice coughing to be sure your lungs are clear. If you have an incision (the cut made at the time of surgery), support your incision when coughing by placing a pillow or rolled up towels firmly against it. Once you are able to get out of bed, walk around indoors and cough well. You may stop using the incentive spirometer when instructed by your caregiver.  RISKS AND COMPLICATIONS Take your time so you do not get dizzy or light-headed. If you are in pain, you may need to take or ask for pain medication before doing incentive spirometry. It is harder to take a deep breath if you are having pain. AFTER USE Rest and breathe slowly and easily. It can be helpful to keep track of a log of your progress. Your caregiver can provide you with a simple table to help with this. If you are using the spirometer at home, follow these instructions: [place] IF:  You are having difficultly using the spirometer. You have trouble using the spirometer as often as instructed. Your pain medication is not giving enough relief while using the spirometer. You develop fever of 100.5 F (38.1 C) or higher. SEEK IMMEDIATE MEDICAL CARE IF:  You cough up bloody sputum that had not been present before. You develop fever of 102 F (38.9 C) or greater. You develop worsening pain at or near the incision site. MAKE SURE YOU:  Understand these instructions. Will watch your condition. Will get help right away if you are not doing well or get worse. Document Released: 09/01/2006 Document Revised: 07/14/2011 Document Reviewed: 11/02/2006 [facility] Patient Information 2014 [place], [place].   ________________________________________________________________________

## 2021-07-08 NOTE — Progress Notes (Signed)
COVID swab appointment: N/A ? ?COVID Vaccine Completed: ?Date COVID Vaccine completed: ?Has received booster: ?COVID vaccine manufacturer: [employer]  ? ?Date of COVID positive in last 90 days: ? ?PCP - Nicholes Rough, PA-C ?Cardiologist - Un Joellyn Rued, DO ? ?Chest x-ray - 07-30-20 Epic ?EKG -  ?Stress Test - greater than 2 years CEW ?ECHO - greater than 2 years CEW ?Cardiac Cath -  ?Pacemaker/ICD device last checked: ?Spinal Cord Stimulator: ? ? ?Bowel Prep -  ? ?Sleep Study -  ?CPAP -  ? ?Fasting Blood Sugar -  ?Checks Blood Sugar _____ times a day ? ?Blood Thinner Instructions: ?Aspirin Instructions: ?Last Dose: ? ?Activity level:  Can go up a flight of stairs and perform activities of daily living without stopping and without symptoms of chest pain or shortness of breath. ?  Able to exercise without symptoms ? ?Unable to go up a flight of stairs without symptoms of  ?   ? ?Anesthesia review:  COPD, SOB and palpitaitons evaluated by cardiology in 2018 ? ?Patient denies shortness of breath, fever, cough and chest pain at PAT appointment ? ? ?Patient verbalized understanding of instructions that were given to them at the PAT appointment. Patient was also instructed that they will need to review over the PAT instructions again at home before surgery.  ?

## 2021-07-10 ENCOUNTER — Encounter (HOSPITAL_COMMUNITY)
Admission: RE | Admit: 2021-07-10 | Discharge: 2021-07-10 | Disposition: A | Discharge status: Home / Self Care | Payer: 59 | Source: Ambulatory Visit | Attending: Physician Assistant | Admitting: Physician Assistant

## 2021-07-10 DIAGNOSIS — D649 Anemia, unspecified: Secondary | ICD-10-CM

## 2021-07-10 DIAGNOSIS — I2510 Atherosclerotic heart disease of native coronary artery without angina pectoris: Secondary | ICD-10-CM

## 2021-07-10 DIAGNOSIS — E119 Type 2 diabetes mellitus without complications: Secondary | ICD-10-CM

## 2021-07-11 NOTE — Patient Instructions (Addendum)
DUE TO COVID-19 ONLY ONE VISITOR  (aged 56 and older)  IS ALLOWED TO COME WITH YOU AND STAY IN THE WAITING ROOM ONLY DURING PRE OP AND PROCEDURE.   **NO VISITORS ARE ALLOWED IN THE SHORT STAY AREA OR RECOVERY ROOM!!**       Your procedure is scheduled on: 07/16/21   Report to [facility] Main Entrance    Report to admitting at 5:15 AM   Call this number if you have problems the morning of surgery [phone]   Do not eat food :After Midnight.   After Midnight you may have the following liquids until 4:15 AM DAY OF SURGERY  Water Black Coffee (sugar ok, NO MILK/CREAM OR CREAMERS)  Tea (sugar ok, NO MILK/CREAM OR CREAMERS) regular and decaf                             Plain Jell-O (NO RED)                                           Fruit ices (not with fruit pulp, NO RED)                                     Popsicles (NO RED)                                                                  Juice: apple, WHITE grape, WHITE cranberry Sports drinks like Gatorade (NO RED) Clear broth(vegetable,chicken,beef)                The day of surgery:  Drink ONE (1) Pre-Surgery G2 at 4:15 AM the morning of surgery. Drink in one sitting. Do not sip.  This drink was given to you during your hospital  pre-op appointment visit. Nothing else to drink after completing the  Pre-Surgery G2.          If you have questions, please contact your surgeons office.   FOLLOW BOWEL PREP AND ANY ADDITIONAL PRE OP INSTRUCTIONS YOU RECEIVED FROM YOUR SURGEON'S OFFICE!!!     Oral Hygiene is also important to reduce your risk of infection.                                    Remember - BRUSH YOUR TEETH THE MORNING OF SURGERY WITH YOUR REGULAR TOOTHPASTE    Take these medicines the morning of surgery with A SIP OF WATER: Alprazolam, Bupropion, Pepcid, Gabapentin, Lamotrigine, Metoprolol, Simvastatin, Inhaler, Vortioxetine   DO NOT TAKE ANY ORAL DIABETIC MEDICATIONS DAY OF YOUR SURGERY  How to Manage  Your Diabetes Before and After Surgery  Why is it important to control my blood sugar before and after surgery? Improving blood sugar levels before and after surgery helps healing and can limit problems. A way of improving blood sugar control is eating a healthy diet by:  Eating less sugar and carbohydrates  Increasing activity/exercise  Talking with your doctor about reaching your  blood sugar goals High blood sugars (greater than 180 mg/dL) can raise your risk of infections and slow your recovery, so you will need to focus on controlling your diabetes during the weeks before surgery. Make sure that the doctor who takes care of your diabetes knows about your planned surgery including the date and location.  How do I manage my blood sugar before surgery? Check your blood sugar at least 4 times a day, starting 2 days before surgery, to make sure that the level is not too high or low. Check your blood sugar the morning of your surgery when you wake up and every 2 hours until you get to the Short Stay unit. If your blood sugar is less than 70 mg/dL, you will need to treat for low blood sugar: Do not take insulin. Treat a low blood sugar (less than 70 mg/dL) with  cup of clear juice (cranberry or apple), 4 glucose tablets, OR glucose gel. Recheck blood sugar in 15 minutes after treatment (to make sure it is greater than 70 mg/dL). If your blood sugar is not greater than 70 mg/dL on recheck, call [phone] for further instructions. Report your blood sugar to the short stay nurse when you get to Short Stay.  If you are admitted to the hospital after surgery: Your blood sugar will be checked by the staff and you will probably be given insulin after surgery (instead of oral diabetes medicines) to make sure you have good blood sugar levels. The goal for blood sugar control after surgery is 80-180 mg/dL.   WHAT DO I DO ABOUT MY DIABETES MEDICATION?  Do not take oral diabetes medicines (pills)  the morning of surgery.  THE DAY BEFORE SURGERY, take Januvia as prescribed.       THE MORNING OF SURGERY, do not take Januvia.   Reviewed and Endorsed by [facility] Patient Education Committee, August 2015                               You may not have any metal on your body including hair pins, jewelry, and body piercing             Do not wear make-up, lotions, powders, perfumes, or deodorant  Do not wear nail polish including gel and S&S, artificial/acrylic nails, or any other type of covering on natural nails including finger and toenails. If you have artificial nails, gel coating, etc. that needs to be removed by a nail salon please have this removed prior to surgery or surgery may need to be canceled/ delayed if the surgeon/ anesthesia feels like they are unable to be safely monitored.   Do not shave  48 hours prior to surgery.    Do not bring valuables to the hospital. [place].   Contacts, dentures or bridgework may not be worn into surgery.    Patients discharged on the day of surgery will not be allowed to drive home.  Someone NEEDS to stay with you for the first 24 hours after anesthesia.   Special Instructions: Bring a copy of your healthcare power of attorney and living will documents         the day of surgery if you haven't scanned them before.              Please read over the following  fact sheets you were given: IF YOU HAVE QUESTIONS ABOUT YOUR PRE-OP INSTRUCTIONS PLEASE CALL [phone]- [place] - Preparing for Surgery Before surgery, you can play an important role.  Because skin is not sterile, your skin needs to be as free of germs as possible.  You can reduce the number of germs on your skin by washing with CHG (chlorahexidine gluconate) soap before surgery.  CHG is an antiseptic cleaner which kills germs and bonds with the skin to continue killing germs even after washing. Please DO NOT use if you  have an allergy to CHG or antibacterial soaps.  If your skin becomes reddened/irritated stop using the CHG and inform your nurse when you arrive at Short Stay. Do not shave (including legs and underarms) for at least 48 hours prior to the first CHG shower.  You may shave your face/neck.  Please follow these instructions carefully:  1.  Shower with CHG Soap the night before surgery and the  morning of surgery.  2.  If you choose to wash your hair, wash your hair first as usual with your normal  shampoo.  3.  After you shampoo, rinse your hair and body thoroughly to remove the shampoo.                             4.  Use CHG as you would any other liquid soap.  You can apply chg directly to the skin and wash.  Gently with a scrungie or clean washcloth.  5.  Apply the CHG Soap to your body ONLY FROM THE NECK DOWN.   Do   not use on face/ open                           Wound or open sores. Avoid contact with eyes, ears mouth and   genitals (private parts).                       Wash face,  Genitals (private parts) with your normal soap.             6.  Wash thoroughly, paying special attention to the area where your    surgery  will be performed.  7.  Thoroughly rinse your body with warm water from the neck down.  8.  DO NOT shower/wash with your normal soap after using and rinsing off the CHG Soap.                9.  Pat yourself dry with a clean towel.            10.  Wear clean pajamas.            11.  Place clean sheets on your bed the night of your first shower and do not  sleep with pets. Day of Surgery : Do not apply any lotions/deodorants the morning of surgery.  Please wear clean clothes to the hospital/surgery center.  FAILURE TO FOLLOW THESE INSTRUCTIONS MAY RESULT IN THE CANCELLATION OF YOUR SURGERY  PATIENT SIGNATURE_________________________________  NURSE SIGNATURE__________________________________  ________________________________________________________________________    Adam Phenix  An incentive spirometer is a tool that can help keep your lungs clear and active. This tool measures how well you are filling your lungs with each breath. Taking long deep breaths may help reverse or decrease the chance of developing breathing (pulmonary) problems (especially  infection) following: A long period of time when you are unable to move or be active. BEFORE THE PROCEDURE  If the spirometer includes an indicator to show your best effort, your nurse or respiratory therapist will set it to a desired goal. If possible, sit up straight or lean slightly forward. Try not to slouch. Hold the incentive spirometer in an upright position. INSTRUCTIONS FOR USE  Sit on the edge of your bed if possible, or sit up as far as you can in bed or on a chair. Hold the incentive spirometer in an upright position. Breathe out normally. Place the mouthpiece in your mouth and seal your lips tightly around it. Breathe in slowly and as deeply as possible, raising the piston or the ball toward the top of the column. Hold your breath for 3-5 seconds or for as long as possible. Allow the piston or ball to fall to the bottom of the column. Remove the mouthpiece from your mouth and breathe out normally. Rest for a few seconds and repeat Steps 1 through 7 at least 10 times every 1-2 hours when you are awake. Take your time and take a few normal breaths between deep breaths. The spirometer may include an indicator to show your best effort. Use the indicator as a goal to work toward during each repetition. After each set of 10 deep breaths, practice coughing to be sure your lungs are clear. If you have an incision (the cut made at the time of surgery), support your incision when coughing by placing a pillow or rolled up towels firmly against it. Once you are able to get out of bed, walk around indoors and cough well. You may stop using the incentive spirometer when instructed by your caregiver.   RISKS AND COMPLICATIONS Take your time so you do not get dizzy or light-headed. If you are in pain, you may need to take or ask for pain medication before doing incentive spirometry. It is harder to take a deep breath if you are having pain. AFTER USE Rest and breathe slowly and easily. It can be helpful to keep track of a log of your progress. Your caregiver can provide you with a simple table to help with this. If you are using the spirometer at home, follow these instructions: [place] IF:  You are having difficultly using the spirometer. You have trouble using the spirometer as often as instructed. Your pain medication is not giving enough relief while using the spirometer. You develop fever of 100.5 F (38.1 C) or higher. SEEK IMMEDIATE MEDICAL CARE IF:  You cough up bloody sputum that had not been present before. You develop fever of 102 F (38.9 C) or greater. You develop worsening pain at or near the incision site. MAKE SURE YOU:  Understand these instructions. Will watch your condition. Will get help right away if you are not doing well or get worse. Document Released: 09/01/2006 Document Revised: 07/14/2011 Document Reviewed: 11/02/2006 [facility] Patient Information 2014 [place], [place].   ________________________________________________________________________

## 2021-07-12 ENCOUNTER — Other Ambulatory Visit: Payer: Self-pay

## 2021-07-12 ENCOUNTER — Encounter (HOSPITAL_COMMUNITY)
Admission: RE | Admit: 2021-07-12 | Discharge: 2021-07-12 | Disposition: A | Discharge status: Home / Self Care | Payer: 59 | Source: Ambulatory Visit | Attending: Orthopaedic Surgery | Admitting: Orthopaedic Surgery

## 2021-07-12 ENCOUNTER — Encounter (HOSPITAL_COMMUNITY): Payer: Self-pay

## 2021-07-12 DIAGNOSIS — Z01818 Encounter for other preprocedural examination: Secondary | ICD-10-CM | POA: Insufficient documentation

## 2021-07-12 DIAGNOSIS — E119 Type 2 diabetes mellitus without complications: Secondary | ICD-10-CM | POA: Diagnosis not present

## 2021-07-12 DIAGNOSIS — I2510 Atherosclerotic heart disease of native coronary artery without angina pectoris: Secondary | ICD-10-CM | POA: Insufficient documentation

## 2021-07-12 DIAGNOSIS — D649 Anemia, unspecified: Secondary | ICD-10-CM | POA: Diagnosis not present

## 2021-07-12 HISTORY — DX: Headache, unspecified: R51.9

## 2021-07-12 HISTORY — DX: Pneumonia, unspecified organism: J18.9

## 2021-07-12 LAB — BASIC METABOLIC PANEL
Anion gap: 7 (ref 5–15)
BUN: 21 mg/dL — ABNORMAL HIGH (ref 6–20)
CO2: 28 mmol/L (ref 22–32)
Calcium: 8.6 mg/dL — ABNORMAL LOW (ref 8.9–10.3)
Chloride: 104 mmol/L (ref 98–111)
Creatinine, Ser: 1.01 mg/dL — ABNORMAL HIGH (ref 0.44–1.00)
GFR, Estimated: >60 mL/min (ref >60)
Glucose, Bld: 108 mg/dL — ABNORMAL HIGH (ref 70–99)
Potassium: 3.8 mmol/L (ref 3.5–5.1)
Sodium: 139 mmol/L (ref 135–145)

## 2021-07-12 LAB — CBC
HCT: 36.5 % (ref 36.0–46.0)
Hemoglobin: 11.2 g/dL — ABNORMAL LOW (ref 12.0–15.0)
MCH: 26.9 pg (ref 26.0–34.0)
MCHC: 30.7 g/dL (ref 30.0–36.0)
MCV: 87.5 fL (ref 80.0–100.0)
Platelets: 311 10*3/uL (ref 150–400)
RBC: 4.17 MIL/uL (ref 3.87–5.11)
RDW: 14.6 % (ref 11.5–15.5)
WBC: 6.3 10*3/uL (ref 4.0–10.5)
nRBC: 0 % (ref 0.0–0.2)

## 2021-07-12 LAB — GLUCOSE, CAPILLARY: Glucose-Capillary: 120 mg/dL — ABNORMAL HIGH (ref 70–99)

## 2021-07-12 NOTE — Progress Notes (Signed)
COVID swab appointment: N/A ?  ?COVID Vaccine Completed: yes x2 ?Date COVID Vaccine completed: ?Has received booster: ?COVID vaccine manufacturer:   Moderna    ?  ?Date of COVID positive in last 90 days: n/a ?  ?PCP - Nicholes Rough, PA-C ?Cardiologist - Un Joellyn Rued, DO ?  ?Chest x-ray - 07-30-20 Epic ?EKG - 07/12/21 Epic/chart ?Stress Test - greater than 2 years [facility] ?ECHO - greater than 2 years [facility] ?Cardiac Cath - n/a ?Pacemaker/ICD device last checked: n/a ?Spinal Cord Stimulator: n/a ?  ?  ?Bowel Prep - no ?  ?Sleep Study - yes, negative  ?CPAP -  ?  ?Fasting Blood Sugar - only checks at doctors ?Checks Blood Sugar __ times a day ?  ?Blood Thinner Instructions: n/a ?Aspirin Instructions: ?Last Dose: ?  ?Activity level:   Can go up a flight of stairs and perform activities of daily living without stopping and without symptoms of chest pain. Difficulty with stairs due to knees. Endorses SOB with activity  ?                       ?Anesthesia review:  COPD, SOB and palpitaitons evaluated by cardiology in 2018 ?  ?Patient denies shortness of breath, fever, cough and chest pain at PAT appointment ?  ?  ?Patient verbalized understanding of instructions that were given to them at the PAT appointment. Patient was also instructed that they will need to review over the PAT instructions again at home before surgery. ?

## 2021-07-13 LAB — HEMOGLOBIN A1C
Hgb A1c MFr Bld: 6.6 % — ABNORMAL HIGH (ref 4.8–5.6)
Mean Plasma Glucose: 143 mg/dL

## 2021-07-15 ENCOUNTER — Encounter (HOSPITAL_COMMUNITY): Payer: Self-pay | Admitting: Orthopaedic Surgery

## 2021-07-15 NOTE — Anesthesia Preprocedure Evaluation (Addendum)
Anesthesia Evaluation  Patient identified by MRN, date of birth, ID band Patient awake    Reviewed: Allergy & Precautions, NPO status , Patient's Chart, lab work & pertinent test results, reviewed documented beta blocker date and time   History of Anesthesia Complications (+) PONV and history of anesthetic complications  Airway Mallampati: II  TM Distance: >3 FB Neck ROM: Full    Dental no notable dental hx. (+) Teeth Intact, Dental Advisory Given   Pulmonary asthma , pneumonia, resolved, COPD,  COPD inhaler,    Pulmonary exam normal breath sounds clear to auscultation       Cardiovascular hypertension, Pt. on medications Normal cardiovascular exam Rhythm:Regular Rate:Normal  EKG 07/12/21 Sinus Bradycardia, possible anterior MI  Echo 03/28/2009 LEVEF 55-60%   Neuro/Psych  Headaches, PSYCHIATRIC DISORDERS Anxiety Depression ADDHx/o Bell's palsy   Neuromuscular disease    GI/Hepatic Neg liver ROS, GERD  Medicated,Hx/o gastric bypass   Endo/Other  diabetes, Well Controlled, Type 2, Oral Hypoglycemic AgentsHypothyroidism Morbid obesityHyperlipidemia  Renal/GU Renal diseaseHx/o renal calculi  negative genitourinary   Musculoskeletal  (+) Arthritis , Osteoarthritis,  Fibromyalgia -Impingement syndrome left shoulder Torn left rotator cuff Torn left biceps tendon   Abdominal (+) + obese,   Peds  Hematology  (+) Blood dyscrasia, anemia ,   Anesthesia Other Findings   Reproductive/Obstetrics                            Anesthesia Physical Anesthesia Plan  ASA: 3  Anesthesia Plan: General   Post-op Pain Management: Regional block*   Induction: Intravenous  PONV Risk Score and Plan: 4 or greater and Treatment may vary due to age or medical condition, Scopolamine patch - Pre-op, Midazolam, Dexamethasone and Ondansetron  Airway Management Planned: Oral ETT  Additional Equipment:  None  Intra-op Plan:   Post-operative Plan: Extubation in OR  Informed Consent: I have reviewed the patients History and Physical, chart, labs and discussed the procedure including the risks, benefits and alternatives for the proposed anesthesia with the patient or authorized representative who has indicated his/her understanding and acceptance.     Dental advisory given  Plan Discussed with: CRNA and Anesthesiologist  Anesthesia Plan Comments:        Anesthesia Quick Evaluation

## 2021-07-16 ENCOUNTER — Encounter (HOSPITAL_COMMUNITY): Payer: Self-pay | Admitting: Orthopaedic Surgery

## 2021-07-16 ENCOUNTER — Other Ambulatory Visit: Payer: Self-pay

## 2021-07-16 ENCOUNTER — Ambulatory Visit (HOSPITAL_BASED_OUTPATIENT_CLINIC_OR_DEPARTMENT_OTHER): Payer: 59 | Admitting: Anesthesiology

## 2021-07-16 ENCOUNTER — Ambulatory Visit (HOSPITAL_COMMUNITY): Payer: 59 | Admitting: Physician Assistant

## 2021-07-16 ENCOUNTER — Encounter (HOSPITAL_COMMUNITY)
Admission: RE | Disposition: A | Discharge status: Home / Self Care | Payer: Self-pay | Source: Ambulatory Visit | Attending: Orthopaedic Surgery

## 2021-07-16 ENCOUNTER — Ambulatory Visit (HOSPITAL_COMMUNITY)
Admission: RE | Admit: 2021-07-16 | Discharge: 2021-07-16 | Disposition: A | Discharge status: Home / Self Care | Payer: 59 | Source: Ambulatory Visit | Attending: Orthopaedic Surgery | Admitting: Orthopaedic Surgery

## 2021-07-16 ENCOUNTER — Other Ambulatory Visit: Payer: Self-pay | Admitting: Orthopaedic Surgery

## 2021-07-16 DIAGNOSIS — E1122 Type 2 diabetes mellitus with diabetic chronic kidney disease: Secondary | ICD-10-CM | POA: Insufficient documentation

## 2021-07-16 DIAGNOSIS — M75112 Incomplete rotator cuff tear or rupture of left shoulder, not specified as traumatic: Secondary | ICD-10-CM | POA: Diagnosis present

## 2021-07-16 DIAGNOSIS — M65812 Other synovitis and tenosynovitis, left shoulder: Secondary | ICD-10-CM | POA: Insufficient documentation

## 2021-07-16 DIAGNOSIS — G510 Bell's palsy: Secondary | ICD-10-CM | POA: Insufficient documentation

## 2021-07-16 DIAGNOSIS — M7582 Other shoulder lesions, left shoulder: Secondary | ICD-10-CM | POA: Diagnosis not present

## 2021-07-16 DIAGNOSIS — M7522 Bicipital tendinitis, left shoulder: Secondary | ICD-10-CM | POA: Diagnosis not present

## 2021-07-16 DIAGNOSIS — Z7984 Long term (current) use of oral hypoglycemic drugs: Secondary | ICD-10-CM | POA: Insufficient documentation

## 2021-07-16 DIAGNOSIS — M19012 Primary osteoarthritis, left shoulder: Secondary | ICD-10-CM | POA: Diagnosis present

## 2021-07-16 DIAGNOSIS — E785 Hyperlipidemia, unspecified: Secondary | ICD-10-CM | POA: Insufficient documentation

## 2021-07-16 DIAGNOSIS — M75102 Unspecified rotator cuff tear or rupture of left shoulder, not specified as traumatic: Secondary | ICD-10-CM | POA: Diagnosis not present

## 2021-07-16 DIAGNOSIS — N189 Chronic kidney disease, unspecified: Secondary | ICD-10-CM | POA: Diagnosis not present

## 2021-07-16 DIAGNOSIS — Z6841 Body Mass Index (BMI) 40.0 and over, adult: Secondary | ICD-10-CM | POA: Diagnosis not present

## 2021-07-16 DIAGNOSIS — K219 Gastro-esophageal reflux disease without esophagitis: Secondary | ICD-10-CM | POA: Insufficient documentation

## 2021-07-16 DIAGNOSIS — F32A Depression, unspecified: Secondary | ICD-10-CM | POA: Diagnosis not present

## 2021-07-16 DIAGNOSIS — M7542 Impingement syndrome of left shoulder: Secondary | ICD-10-CM | POA: Insufficient documentation

## 2021-07-16 DIAGNOSIS — I129 Hypertensive chronic kidney disease with stage 1 through stage 4 chronic kidney disease, or unspecified chronic kidney disease: Secondary | ICD-10-CM | POA: Insufficient documentation

## 2021-07-16 DIAGNOSIS — M75122 Complete rotator cuff tear or rupture of left shoulder, not specified as traumatic: Secondary | ICD-10-CM

## 2021-07-16 DIAGNOSIS — E039 Hypothyroidism, unspecified: Secondary | ICD-10-CM | POA: Insufficient documentation

## 2021-07-16 DIAGNOSIS — J449 Chronic obstructive pulmonary disease, unspecified: Secondary | ICD-10-CM | POA: Insufficient documentation

## 2021-07-16 DIAGNOSIS — Z9884 Bariatric surgery status: Secondary | ICD-10-CM | POA: Insufficient documentation

## 2021-07-16 DIAGNOSIS — F419 Anxiety disorder, unspecified: Secondary | ICD-10-CM | POA: Diagnosis not present

## 2021-07-16 HISTORY — PX: SHOULDER ARTHROSCOPY WITH SUBACROMIAL DECOMPRESSION AND OPEN ROTATOR C: SHX5688

## 2021-07-16 LAB — GLUCOSE, CAPILLARY
Glucose-Capillary: 106 mg/dL — ABNORMAL HIGH (ref 70–99)
Glucose-Capillary: 116 mg/dL — ABNORMAL HIGH (ref 70–99)

## 2021-07-16 SURGERY — SHOULDER ARTHROSCOPY WITH SUBACROMIAL DECOMPRESSION AND OPEN ROTATOR CUFF REPAIR, OPEN BICEPS TENDON REPAIR
Anesthesia: General | Site: Shoulder | Laterality: Left

## 2021-07-16 MED ORDER — FENTANYL CITRATE (PF) 100 MCG/2ML IJ SOLN
INTRAMUSCULAR | Status: AC
  Filled 2021-07-16: qty 2

## 2021-07-16 MED ORDER — MIDAZOLAM HCL 2 MG/2ML IJ SOLN
INTRAMUSCULAR | Status: AC
Start: 2021-07-16 — End: ?
  Filled 2021-07-16: qty 2

## 2021-07-16 MED ORDER — DIPHENHYDRAMINE HCL 50 MG/ML IJ SOLN
INTRAMUSCULAR | Status: AC
  Filled 2021-07-16: qty 1

## 2021-07-16 MED ORDER — CLINDAMYCIN PHOSPHATE 900 MG/50ML IV SOLN
900.0000 mg | INTRAVENOUS | Status: AC
  Administered 2021-07-16: 900 mg via INTRAVENOUS
  Filled 2021-07-16: qty 50

## 2021-07-16 MED ORDER — HYDROMORPHONE HCL 1 MG/ML IJ SOLN: 0.2500 mg | INTRAMUSCULAR | Status: DC | PRN

## 2021-07-16 MED ORDER — ONDANSETRON HCL 4 MG/2ML IJ SOLN
INTRAMUSCULAR | Status: DC | PRN
  Administered 2021-07-16: 4 mg via INTRAVENOUS

## 2021-07-16 MED ORDER — BUPIVACAINE-EPINEPHRINE 0.25% -1:200000 IJ SOLN
INTRAMUSCULAR | Status: DC | PRN
  Administered 2021-07-16: 10 mL
  Administered 2021-07-16: 15 mL

## 2021-07-16 MED ORDER — OXYCODONE-ACETAMINOPHEN 10-325 MG PO TABS: 1.0000 | ORAL_TABLET | ORAL | 0 refills | Status: DC | PRN

## 2021-07-16 MED ORDER — LIDOCAINE HCL (PF) 2 % IJ SOLN
INTRAMUSCULAR | Status: AC
  Filled 2021-07-16: qty 5

## 2021-07-16 MED ORDER — DIPHENHYDRAMINE HCL 50 MG/ML IJ SOLN
INTRAMUSCULAR | Status: DC | PRN
  Administered 2021-07-16: 12.5 mg via INTRAVENOUS

## 2021-07-16 MED ORDER — ORAL CARE MOUTH RINSE: 15.0000 mL | Freq: Once | OROMUCOSAL | Status: AC

## 2021-07-16 MED ORDER — ONDANSETRON HCL 4 MG/2ML IJ SOLN: 4.0000 mg | Freq: Once | INTRAMUSCULAR | Status: DC | PRN

## 2021-07-16 MED ORDER — FENTANYL CITRATE (PF) 100 MCG/2ML IJ SOLN
INTRAMUSCULAR | Status: DC | PRN
Start: 2021-07-16 — End: 2021-07-16
  Administered 2021-07-16 (×2): 50 ug via INTRAVENOUS

## 2021-07-16 MED ORDER — PROPOFOL 10 MG/ML IV BOLUS
INTRAVENOUS | Status: DC | PRN
  Administered 2021-07-16: 160 mg via INTRAVENOUS

## 2021-07-16 MED ORDER — OXYCODONE HCL 5 MG/5ML PO SOLN: 5.0000 mg | Freq: Once | ORAL | Status: DC | PRN

## 2021-07-16 MED ORDER — SUGAMMADEX SODIUM 200 MG/2ML IV SOLN
INTRAVENOUS | Status: DC | PRN
Start: 2021-07-16 — End: 2021-07-16
  Administered 2021-07-16: 250 mg via INTRAVENOUS

## 2021-07-16 MED ORDER — PHENYLEPHRINE HCL-NACL 20-0.9 MG/250ML-% IV SOLN
INTRAVENOUS | Status: DC | PRN
  Administered 2021-07-16: 35 ug/min via INTRAVENOUS

## 2021-07-16 MED ORDER — CHLORHEXIDINE GLUCONATE 0.12 % MT SOLN
15.0000 mL | Freq: Once | OROMUCOSAL | Status: AC
  Administered 2021-07-16: 15 mL via OROMUCOSAL

## 2021-07-16 MED ORDER — SCOPOLAMINE 1 MG/3DAYS TD PT72
MEDICATED_PATCH | TRANSDERMAL | Status: AC
  Filled 2021-07-16: qty 1

## 2021-07-16 MED ORDER — ROCURONIUM BROMIDE 10 MG/ML (PF) SYRINGE
PREFILLED_SYRINGE | INTRAVENOUS | Status: AC
  Filled 2021-07-16: qty 10

## 2021-07-16 MED ORDER — IPRATROPIUM-ALBUTEROL 0.5-2.5 (3) MG/3ML IN SOLN
3.0000 mL | Freq: Once | RESPIRATORY_TRACT | Status: AC | PRN
  Administered 2021-07-16: 3 mL via RESPIRATORY_TRACT
  Filled 2021-07-16: qty 3

## 2021-07-16 MED ORDER — ROCURONIUM BROMIDE 10 MG/ML (PF) SYRINGE
PREFILLED_SYRINGE | INTRAVENOUS | Status: DC | PRN
  Administered 2021-07-16: 70 mg via INTRAVENOUS

## 2021-07-16 MED ORDER — MIDAZOLAM HCL 5 MG/5ML IJ SOLN
INTRAMUSCULAR | Status: DC | PRN
  Administered 2021-07-16: 2 mg via INTRAVENOUS

## 2021-07-16 MED ORDER — 0.9 % SODIUM CHLORIDE (POUR BTL) OPTIME
TOPICAL | Status: DC | PRN
  Administered 2021-07-16: 1000 mL

## 2021-07-16 MED ORDER — BUPIVACAINE LIPOSOME 1.3 % IJ SUSP
INTRAMUSCULAR | Status: DC | PRN
  Administered 2021-07-16: 10 mL via PERINEURAL

## 2021-07-16 MED ORDER — AMISULPRIDE (ANTIEMETIC) 5 MG/2ML IV SOLN: 10.0000 mg | Freq: Once | INTRAVENOUS | Status: DC | PRN

## 2021-07-16 MED ORDER — PROPOFOL 10 MG/ML IV BOLUS
INTRAVENOUS | Status: AC
  Filled 2021-07-16: qty 20

## 2021-07-16 MED ORDER — ONDANSETRON HCL 4 MG/2ML IJ SOLN
INTRAMUSCULAR | Status: AC
  Filled 2021-07-16: qty 2

## 2021-07-16 MED ORDER — PHENYLEPHRINE HCL-NACL 20-0.9 MG/250ML-% IV SOLN
INTRAVENOUS | Status: AC
  Filled 2021-07-16: qty 250

## 2021-07-16 MED ORDER — SODIUM CHLORIDE 0.9 % IR SOLN
Status: DC | PRN
  Administered 2021-07-16 (×2): 3000 mL
  Administered 2021-07-16: 2000 mL

## 2021-07-16 MED ORDER — DEXAMETHASONE SODIUM PHOSPHATE 10 MG/ML IJ SOLN
INTRAMUSCULAR | Status: DC | PRN
  Administered 2021-07-16: 8 mg via INTRAVENOUS

## 2021-07-16 MED ORDER — LIDOCAINE 2% (20 MG/ML) 5 ML SYRINGE
INTRAMUSCULAR | Status: DC | PRN
  Administered 2021-07-16: 60 mg via INTRAVENOUS

## 2021-07-16 MED ORDER — BUPIVACAINE-EPINEPHRINE (PF) 0.25% -1:200000 IJ SOLN
INTRAMUSCULAR | Status: AC
  Filled 2021-07-16: qty 30

## 2021-07-16 MED ORDER — BUPIVACAINE HCL (PF) 0.5 % IJ SOLN
INTRAMUSCULAR | Status: DC | PRN
  Administered 2021-07-16: 15 mL via PERINEURAL

## 2021-07-16 MED ORDER — LACTATED RINGERS IV SOLN: INTRAVENOUS | Status: DC

## 2021-07-16 MED ORDER — OXYCODONE HCL 5 MG PO TABS: 5.0000 mg | ORAL_TABLET | Freq: Once | ORAL | Status: DC | PRN

## 2021-07-16 MED ORDER — DEXAMETHASONE SODIUM PHOSPHATE 10 MG/ML IJ SOLN
INTRAMUSCULAR | Status: AC
  Filled 2021-07-16: qty 1

## 2021-07-16 SURGICAL SUPPLY — 69 items
AID PSTN UNV HD RSTRNT DISP (MISCELLANEOUS) ×1
APL SKNCLS STERI-STRIP NONHPOA (GAUZE/BANDAGES/DRESSINGS) ×1
BAG COUNTER SPONGE SURGICOUNT (BAG) ×1 IMPLANT
BAG DECANTER FOR FLEXI CONT (MISCELLANEOUS) IMPLANT
BAG SPNG CNTER NS LX DISP (BAG) ×1
BENZOIN TINCTURE PRP APPL 2/3 (GAUZE/BANDAGES/DRESSINGS) ×1 IMPLANT
BLADE AVERAGE 25X9 (BLADE) IMPLANT
BLADE EXCALIBUR 4.0X13 (MISCELLANEOUS) ×1 IMPLANT
BLADE SURG SZ11 CARB STEEL (BLADE) ×2 IMPLANT
BURR OVAL 8 FLU 5.0X13 (MISCELLANEOUS) ×3 IMPLANT
CANNULA 5.75X71 LONG (CANNULA) IMPLANT
CANNULA ACUFLEX KIT 5X76 (CANNULA) ×2 IMPLANT
DISSECTOR 4.0MM X 13CM (MISCELLANEOUS) ×2 IMPLANT
DRAPE IMP U-DRAPE 54X76 (DRAPES) ×2 IMPLANT
DRAPE SHOULDER BEACH CHAIR (DRAPES) ×2 IMPLANT
DRAPE SURG 17X23 STRL (DRAPES) ×2 IMPLANT
DRAPE U-SHAPE 47X51 STRL (DRAPES) ×2 IMPLANT
DRSG EMULSION OIL 3X3 NADH (GAUZE/BANDAGES/DRESSINGS) ×2 IMPLANT
DRSG PAD ABDOMINAL 8X10 ST (GAUZE/BANDAGES/DRESSINGS) ×4 IMPLANT
DURAPREP 26ML APPLICATOR (WOUND CARE) ×2 IMPLANT
ELECT NDL TIP 2.8 STRL (NEEDLE) IMPLANT
ELECT NEEDLE TIP 2.8 STRL (NEEDLE) ×2 IMPLANT
ELECT REM PT RETURN 15FT ADLT (MISCELLANEOUS) ×2 IMPLANT
GAUZE 4X4 16PLY ~~LOC~~+RFID DBL (SPONGE) ×1 IMPLANT
GAUZE SPONGE 4X4 12PLY STRL (GAUZE/BANDAGES/DRESSINGS) ×3 IMPLANT
GLOVE SRG 8 PF TXTR STRL LF DI (GLOVE) ×1 IMPLANT
GLOVE SURG ENC MOIS LTX SZ8.5 (GLOVE) ×1 IMPLANT
GLOVE SURG NEOPR MICRO LF SZ8 (GLOVE) ×4 IMPLANT
GLOVE SURG UNDER POLY LF SZ8 (GLOVE) ×2
GLOVE SURG UNDER POLY LF SZ8.5 (GLOVE) ×3 IMPLANT
GOWN STRL REUS W/ TWL XL LVL3 (GOWN DISPOSABLE) IMPLANT
GOWN STRL REUS W/TWL 2XL LVL3 (GOWN DISPOSABLE) ×2 IMPLANT
GOWN STRL REUS W/TWL XL LVL3 (GOWN DISPOSABLE) ×6
KIT BASIN OR (CUSTOM PROCEDURE TRAY) ×2 IMPLANT
KIT TURNOVER KIT A (KITS) ×1 IMPLANT
MANIFOLD NEPTUNE II (INSTRUMENTS) ×2 IMPLANT
NDL 1/2 CIR CATGUT .05X1.09 (NEEDLE) IMPLANT
NDL SCORPION MULTI FIRE (NEEDLE) IMPLANT
NDL SPNL 18GX3.5 QUINCKE PK (NEEDLE) ×1 IMPLANT
NEEDLE 1/2 CIR CATGUT .05X1.09 (NEEDLE) IMPLANT
NEEDLE SCORPION MULTI FIRE (NEEDLE) IMPLANT
NEEDLE SPNL 18GX3.5 QUINCKE PK (NEEDLE) ×2 IMPLANT
NS IRRIG 1000ML POUR BTL (IV SOLUTION) ×1 IMPLANT
PACK SHOULDER (CUSTOM PROCEDURE TRAY) ×2 IMPLANT
PORT APPOLLO RF 90DEGREE MULTI (SURGICAL WAND) ×1 IMPLANT
PROBE APOLLO 90XL (SURGICAL WAND) ×1 IMPLANT
RESTRAINT HEAD UNIVERSAL NS (MISCELLANEOUS) ×2 IMPLANT
SLING ARM FOAM STRAP LRG (SOFTGOODS) IMPLANT
SLING ARM FOAM STRAP MED (SOFTGOODS) IMPLANT
SLING ARM IMMOBILIZER LRG (SOFTGOODS) ×1 IMPLANT
SPIKE FLUID TRANSFER (MISCELLANEOUS) ×1 IMPLANT
SPONGE T-LAP 4X18 ~~LOC~~+RFID (SPONGE) ×4 IMPLANT
STAPLER VISISTAT 35W (STAPLE) IMPLANT
STRIP CLOSURE SKIN 1/2X4 (GAUZE/BANDAGES/DRESSINGS) ×1 IMPLANT
SUCTION FRAZIER HANDLE 10FR (MISCELLANEOUS)
SUCTION TUBE FRAZIER 10FR DISP (MISCELLANEOUS) IMPLANT
SUT ETHIBOND 0 (SUTURE) ×3 IMPLANT
SUT ETHILON 3 0 PS 1 (SUTURE) IMPLANT
SUT MNCRL AB 3-0 PS2 18 (SUTURE) ×2 IMPLANT
SUT VIC AB 0 CT1 36 (SUTURE) ×1 IMPLANT
SUT VIC AB 2-0 SH 27 (SUTURE) ×2
SUT VIC AB 2-0 SH 27XBRD (SUTURE) ×1 IMPLANT
SUT VIC AB 3-0 SH 27 (SUTURE) ×2
SUT VIC AB 3-0 SH 27XBRD (SUTURE) IMPLANT
TOWEL SURG RFD BLUE STRL DISP (DISPOSABLE) ×2 IMPLANT
TUBING ARTHROSCOPY IRRIG 16FT (MISCELLANEOUS) ×2 IMPLANT
TUBING CONNECTING 10 (TUBING) ×1 IMPLANT
WATER STERILE IRR 1000ML POUR (IV SOLUTION) ×1 IMPLANT
YANKAUER SUCT BULB TIP NO VENT (SUCTIONS) IMPLANT

## 2021-07-16 NOTE — Op Note (Signed)
PATIENT ID:      Molly Berry  ?MRN:     [id number] ?DOB/AGE:    [birth year] / 56 y.o. ? ? ?    OPERATIVE REPORT  ? ? DATE OF PROCEDURE:  07/16/2021   ?   ? PREOPERATIVE DIAGNOSIS:   left shoulder rotator cuff tear, impingement syndrome,  biceps tendonopathy, DJD gleno-humeral joint ?                                                      Estimated body mass index is 46.77 kg/m? as calculated from the following: ?  Height as of this encounter: '5\' 3"'$  (1.6 m). ?  Weight as of this encounter: 119.7 kg. ?   ? POSTOPERATIVE DIAGNOSIS:   same                                                                   Estimated body mass index is 46.77 kg/m? as calculated from the following: ?  Height as of this encounter: '5\' 3"'$  (1.6 m). ?  Weight as of this encounter: 119.7 kg.   ? ? PROCEDURE:  Procedure(s): ?LEFTSHOULDER ARTHROSCOPIC SUBACROMIAL DECOMPRESSION, MINI OPEN ROTATOR CUFF TEAR REPAIR, BICEPS TENOTOMY  ?   ? SURGEON:  Joni Fears, MD  ? ? ASSISTANT:   Biagio Borg, PA-C   (Present and scrubbed throughout the case, critical for assistance with exposure, retraction, instrumentation, and closure.)      ?   ? ANESTHESIA: regional and general ?   ? DRAINS: none :    ? ? TOURNIQUET TIME: * No tourniquets in log *  ? ? COMPLICATIONS:  None  ? ?CONDITION:  stable ? ?PROCEDURE IN DETAIL:  ? ? ?[name] ?07/16/2021, 9:22 AM ?  ?

## 2021-07-16 NOTE — H&P (Signed)
The recent History & Physical has been reviewed. ?I have personally examined the patient today. ?There is no interval change to the documented History & Physical. ?The patient would like to proceed with the procedure. ? ?[name] ?07/16/2021,  7:19 AM ?  ? ?

## 2021-07-16 NOTE — Op Note (Signed)
NAME: Molly Berry, Molly R. ?MEDICAL RECORD NO: [id number] ?ACCOUNT NO: [id number] ?DATE OF BIRTH: [birth year] ?FACILITY: [facility] ?LOCATION: [facility] ?PHYSICIAN: Vonna Kotyk. Durward Fortes, MD ? ?Operative Report  ? ?DATE OF PROCEDURE: 07/16/2021 ? ?PREOPERATIVE DIAGNOSES: ?1.  Left shoulder rotator cuff tear with impingement syndrome and biceps tendinopathy. ?2.  Degenerative arthritis, glenohumeral joint. ? ?POSTOPERATIVE DIAGNOSES: ?1.  Left shoulder rotator cuff tear with impingement syndrome and biceps tendinopathy. ?2.  Degenerative arthritis, glenohumeral joint. ? ?PROCEDURES: ?1.  Diagnostic arthroscopy, left shoulder with synovectomy and debridement of labral fraying. ?2.  Arthroscopic subacromial decompression. ?3.  Mini open rotator cuff tear repair and biceps tenotomy. ? ?SURGEON:  Vonna Kotyk. Durward Fortes, MD ? ?ASSISTANT:  Biagio Borg, PA-C, who was present and scrubbed throughout the case.  His assistance was critical for the exposure, retraction, instrumentation, and closure.  He applied the dressing and a sling, and was present for extubation. ? ?ANESTHESIA:  General laryngeal with supplemental interscalene nerve block. ? ?COMPLICATIONS:  None. ? ?HISTORY:  A 56 year old female who has had months history of left shoulder pain.  There is no history of injury or trauma.  She has had a long course of conservative treatment including medicines and even subacromial cortisone injection without much  ?relief.  She has had an MRI scan demonstrating a tear of the supraspinatus tendon without atrophy with biceps tendinopathy.  There is some degenerative change in the glenohumeral joint and some degenerative change of the [facility] joint.  Because of her long  ?course of discomfort with limitation of activity, she is now to have an arthroscopic procedure and repair of the rotator cuff tear. ? ?DESCRIPTION OF PROCEDURE:  The patient was met in the holding area, identified the left shoulder as the appropriate operative site and marked it  accordingly.  Anesthesia performed an interscalene nerve block. ? ?The patient was then transferred to room #4.  More comfortable on the operating table, general laryngeal anesthesia was induced.  She was then placed in the semi-sitting position with a shoulder frame.  Timeout was called. ? ?Examined the left shoulder under anesthesia without evidence of instability or adhesive capsulitis. ? ?The left shoulder was then prepped circumferentially from the base of the neck to the wrist with DuraPrep.  Sterile draping was performed. ? ?Clindamycin IV was administered with Benadryl as the patient does have history of ALLERGIES TO ANTIBIOTICS. ? ?Marking pen was used to outline the [facility] joint, the coracoid and acromion.  At a point of fingerbreadth posterior and medial to the posterior angle acromion, a small stab wound was made and the arthroscope easily placed into the shoulder joint.  I did  ?inject the subacromial space and the arthroscopic portals with 0.25% Marcaine with epinephrine prior to the start of the procedure. ? ?A diagnostic arthroscopy demonstrated some grade 2 changes of chondromalacia of the humeral head.  There were moderate areas of synovitis.  A second portal was established anteriorly with a ribbed cannula.  I debrided the synovitis.  At that point, I  ?could see that the entire glenoid there were some grade 1 changes of chondromalacia and fraying of both the anterior and posterior labrum.  Cuda shaver was introduced and these areas were debrided. ? ?The biceps tendon was partially torn and quite thick.  I elected to perform biceps tenodesis through the open approach. ? ?The rotator cuff tear involving the supraspinatus was obvious, it was a V-shaped tear.  I could easily visualize the subacromial space.  There were no loose bodies.  There appeared to be some thickening of the subscapularis tendon, but I did not see any  ?tearing. ? ?The arthroscope was then placed in the subacromial space  posteriorly.  A second portal was established in the lateral subacromial space and the ribbed cannula placed in the subacromial space anteriorly.  An arthroscopic subacromial decompression was  ?performed.  There was considerable anterior overhang of the acromion.  I did not see any lateral impingement.  I performed a subacromial decompression with a 6 mm bur with a nice flat resection.  There were some degenerative changes of the [facility] joint on  ?the MRI scan, but it did not appear to be prominent and there was a nice capsule.  I did not see any areas of synovitis or left ____. ? ?Arthroscopic equipment was then removed.  I applied a second pair of latex-free gloves. ? ?Mini open rotator cuff tear repair and biceps tenotomy was performed.  About an inch and a half incision was made anteriorly along the shoulder via sharp dissection carried down to subcutaneous tissue.  A raphae of the deltoid fascia was identified and  ?incised.  The subacromial space was entered.  Self-retaining retractor was inserted.  By finger palpation, I thought she had a very nice subacromial decompression.  There was still some inflammatory bursal tissue, which I removed.  The rotator cuff tear  ?involved the supraspinatus and was a V-shaped, but probably at least 2 cm in length and there was some delamination.  I roughened the edges and then starting at the apex of the tear, I sutured the tendon together with 0 Ethibond suture.  I performed a  ?second row repair with a running 0 Vicryl.  Though I had a nice repair as I placed the arm through range of motion, there was no movement across the repair site.  She had no evidence of impingement.  I did not see any further tearing and the remainder of ? the cuff appeared to be intact.  Prior to repair, I did retrieve the biceps tendon.  There were large areas of tearing and was quite flat and thickened.  I performed a biceps tenotomy.  The wound was irrigated with saline solution.  The deltoid  fascia  ?closed with a running 0 Vicryl, the subQ with 3-0 Monocryl.  Steri-Strips applied to the skin.  Sterile bulky dressing was applied followed by a sling. ? ?PLAN:  Oxycodone 10/325 for pain.  Office in one week. ? ? ?NIK ?D: 07/16/2021 9:33:11 am T: 07/16/2021 10:42:00 am  ?JOB: [id number]/ [id number]  ?

## 2021-07-16 NOTE — Anesthesia Procedure Notes (Signed)
Procedure Name: Intubation ?Date/Time: 07/16/2021 7:36 AM ?Performed by: Rosaland Lao, CRNA ?Pre-anesthesia Checklist: Patient identified, Emergency Drugs available, Suction available and Patient being monitored ?Patient Re-evaluated:Patient Re-evaluated prior to induction ?Oxygen Delivery Method: Circle system utilized ?Preoxygenation: Pre-oxygenation with 100% oxygen ?Induction Type: IV induction ?Ventilation: Mask ventilation without difficulty ?Laryngoscope Size: Sabra Heck and 3 ?Grade View: Grade I ?Tube type: Oral ?Tube size: 7.0 mm ?Number of attempts: 1 ?Airway Equipment and Method: Stylet ?Placement Confirmation: ETT inserted through vocal cords under direct vision, positive ETCO2 and breath sounds checked- equal and bilateral ?Secured at: 21 cm ?Tube secured with: Tape ?Dental Injury: Teeth and Oropharynx as per pre-operative assessment  ? ? ? ? ?

## 2021-07-16 NOTE — Anesthesia Procedure Notes (Signed)
Anesthesia Regional Block: Interscalene brachial plexus block  ? ?Pre-Anesthetic Checklist: , timeout performed,  Correct Patient, Correct Site, Correct Laterality,  Correct Procedure, Correct Position, site marked,  Risks and benefits discussed,  Surgical consent,  Pre-op evaluation,  At surgeon's request and post-op pain management ? ?Laterality: Left ? ?Prep: chloraprep     ?  ?Needles:  ?Injection technique: Single-shot ? ?Needle Type: Echogenic Stimulator Needle   ? ? ?Needle Length: 10cm  ?Needle Gauge: 21  ? ?Needle insertion depth: 6 cm ? ? ?Additional Needles: ? ? ?Procedures:,,,, ultrasound used (permanent image in chart),,   ?Motor weakness within 5 minutes.  ?Narrative:  ?Start time: 07/16/2021 6:59 AM ?End time: 07/16/2021 7:04 AM ?Injection made incrementally with aspirations every 5 mL. ? ?Performed by: Personally  ?Anesthesiologist: Josephine Igo, MD ? ?Additional Notes: ?Timeout performed. Patient sedated. Relevant anatomy ID'd using [place]. Incremental 2-52m injection of LA with frequent aspiration. Patient tolerated procedure well. ? ? ? ?Left Interscalene Block ? ?

## 2021-07-16 NOTE — Transfer of Care (Signed)
Immediate Anesthesia Transfer of Care Note ? ?Patient: Molly Berry ? ?Procedure(s) Performed: LEFT ARTHROSCOPIC SUBACROMIAL DECOMPRESSION, MINI OPEN ROTATOR CUFF TEAR REPAIR, BICEPS TENOTOMY (Left: Shoulder) ? ?Patient Location: PACU ? ?Anesthesia Type:General ? ?Level of Consciousness: awake, alert  and oriented ? ?Airway & Oxygen Therapy: Patient Spontanous Breathing ? ?Post-op Assessment: Report given to RN and Post -op Vital signs reviewed and stable ? ?Post vital signs: Reviewed and stable ? ?Last Vitals:  ?Vitals Value Taken Time  ?BP 152/77 07/16/21 0937  ?Temp    ?Pulse 64 07/16/21 0940  ?Resp 14 07/16/21 0940  ?SpO2 100 % 07/16/21 0940  ?Vitals shown include unvalidated device data. ? ?Last Pain:  ?Vitals:  ? 07/16/21 0615  ?TempSrc:   ?PainSc: 0-No pain  ?   ? ?Patients Stated Pain Goal: 3 (07/16/21 0615) ? ?Complications: No notable events documented. ?

## 2021-07-16 NOTE — Anesthesia Postprocedure Evaluation (Signed)
Anesthesia Post Note ? ?Patient: Molly Berry ? ?Procedure(s) Performed: LEFT ARTHROSCOPIC SUBACROMIAL DECOMPRESSION, MINI OPEN ROTATOR CUFF TEAR REPAIR, BICEPS TENOTOMY (Left: Shoulder) ? ?  ? ?Patient location during evaluation: PACU ?Anesthesia Type: General ?Level of consciousness: awake and alert and oriented ?Pain management: pain level controlled ?Vital Signs Assessment: post-procedure vital signs reviewed and stable ?Respiratory status: spontaneous breathing, nonlabored ventilation and respiratory function stable ?Cardiovascular status: blood pressure returned to baseline and stable ?Postop Assessment: no apparent nausea or vomiting ?Anesthetic complications: no ? ? ?No notable events documented. ? ?Last Vitals:  ?Vitals:  ? 07/16/21 1050 07/16/21 1051  ?BP:  (!) 155/80  ?Pulse:    ?Resp:    ?Temp:    ?SpO2: 93%   ?  ?Last Pain:  ?Vitals:  ? 07/16/21 1049  ?TempSrc:   ?PainSc: 0-No pain  ? ? ?  ?  ?  ?  ?  ?  ? ?Mohan Erven A. ? ? ? ? ?

## 2021-07-17 ENCOUNTER — Emergency Department (HOSPITAL_COMMUNITY)
Admission: EM | Admit: 2021-07-17 | Discharge: 2021-07-17 | Disposition: A | Discharge status: Home / Self Care | Payer: 59 | Attending: Emergency Medicine | Admitting: Emergency Medicine

## 2021-07-17 ENCOUNTER — Emergency Department (HOSPITAL_COMMUNITY): Payer: 59

## 2021-07-17 ENCOUNTER — Other Ambulatory Visit: Payer: Self-pay

## 2021-07-17 ENCOUNTER — Encounter (HOSPITAL_COMMUNITY): Payer: Self-pay | Admitting: Orthopaedic Surgery

## 2021-07-17 DIAGNOSIS — Z7951 Long term (current) use of inhaled steroids: Secondary | ICD-10-CM | POA: Insufficient documentation

## 2021-07-17 DIAGNOSIS — Z79899 Other long term (current) drug therapy: Secondary | ICD-10-CM | POA: Diagnosis not present

## 2021-07-17 DIAGNOSIS — Z91040 Latex allergy status: Secondary | ICD-10-CM | POA: Diagnosis not present

## 2021-07-17 DIAGNOSIS — R079 Chest pain, unspecified: Secondary | ICD-10-CM | POA: Diagnosis present

## 2021-07-17 DIAGNOSIS — R0789 Other chest pain: Secondary | ICD-10-CM

## 2021-07-17 DIAGNOSIS — J449 Chronic obstructive pulmonary disease, unspecified: Secondary | ICD-10-CM | POA: Insufficient documentation

## 2021-07-17 LAB — URINALYSIS, ROUTINE W REFLEX MICROSCOPIC
Bilirubin Urine: NEGATIVE
Glucose, UA: NEGATIVE mg/dL
Hgb urine dipstick: NEGATIVE
Ketones, ur: NEGATIVE mg/dL
Nitrite: NEGATIVE
Protein, ur: NEGATIVE mg/dL
Specific Gravity, Urine: 1.012 (ref 1.005–1.030)
WBC, UA: >50 WBC/hpf — ABNORMAL HIGH (ref 0–5)
pH: 6 (ref 5.0–8.0)

## 2021-07-17 LAB — BASIC METABOLIC PANEL
Anion gap: 9 (ref 5–15)
BUN: 13 mg/dL (ref 6–20)
CO2: 27 mmol/L (ref 22–32)
Calcium: 8.7 mg/dL — ABNORMAL LOW (ref 8.9–10.3)
Chloride: 104 mmol/L (ref 98–111)
Creatinine, Ser: 0.9 mg/dL (ref 0.44–1.00)
GFR, Estimated: >60 mL/min (ref >60)
Glucose, Bld: 119 mg/dL — ABNORMAL HIGH (ref 70–99)
Potassium: 3.9 mmol/L (ref 3.5–5.1)
Sodium: 140 mmol/L (ref 135–145)

## 2021-07-17 LAB — CBC
HCT: 37.2 % (ref 36.0–46.0)
Hemoglobin: 11.4 g/dL — ABNORMAL LOW (ref 12.0–15.0)
MCH: 27.5 pg (ref 26.0–34.0)
MCHC: 30.6 g/dL (ref 30.0–36.0)
MCV: 89.6 fL (ref 80.0–100.0)
Platelets: 320 10*3/uL (ref 150–400)
RBC: 4.15 MIL/uL (ref 3.87–5.11)
RDW: 14.6 % (ref 11.5–15.5)
WBC: 7.5 10*3/uL (ref 4.0–10.5)
nRBC: 0 % (ref 0.0–0.2)

## 2021-07-17 LAB — TROPONIN I (HIGH SENSITIVITY)
Troponin I (High Sensitivity): 2 ng/L (ref <18)
Troponin I (High Sensitivity): <2 ng/L (ref <18)

## 2021-07-17 MED ORDER — OXYCODONE-ACETAMINOPHEN 5-325 MG PO TABS: 1.0000 | ORAL_TABLET | Freq: Once | ORAL | Status: DC

## 2021-07-17 MED ORDER — HYDROMORPHONE HCL 1 MG/ML IJ SOLN
1.0000 mg | Freq: Once | INTRAMUSCULAR | Status: AC
  Administered 2021-07-17: 1 mg via INTRAVENOUS
  Filled 2021-07-17: qty 1

## 2021-07-17 NOTE — Discharge Instructions (Signed)
As discussed, your evaluation today has been largely reassuring.  But, it is important that you monitor your condition carefully, and do not hesitate to return to the ED if you develop new, or concerning changes in your condition. ? ?Otherwise, please follow-up with your physician for appropriate ongoing care. ? ?

## 2021-07-17 NOTE — ED Triage Notes (Signed)
Pt had left rotator cuff surgery yesterday. Pt states they gave antibiotic which caused her to have trouble breathing. Pt states she has had shortness of breath and chest pain since then. Pt states chest feels like a stabbing.  ?

## 2021-07-17 NOTE — ED Provider Notes (Signed)
[facility] EMERGENCY DEPARTMENT Provider Note   CSN: [id number] Arrival date & time: 07/17/21  1641     History  Chief Complaint  Patient presents with   Chest Pain    Molly Berry is a 56 y.o. female.  HPI Patient presents 1 day after elective rotator cuff surgery now with pain that shoots across her chest.  Patient notes that she was generally well prior to the event, though as above, she required planned surgery.  Yesterday, she had a nerve block, reportedly unremarkable surgery.  Today, she awoke with pain that shot across her chest, intermittently.  Pain is transient improved with Percocet.  There is some associated dyspnea but she notes she has a history of COPD.  No fever, chills, nausea, vomiting, confusion, disorientation.    Home Medications Prior to Admission medications   Medication Sig Start Date End Date Taking? Authorizing Provider  albuterol (PROVENTIL HFA;VENTOLIN HFA) 108 (90 BASE) MCG/ACT inhaler Inhale 2 puffs into the lungs every 6 (six) hours as needed for shortness of breath or wheezing.   Yes [provider]  ALPRAZolam Prudy Feeler) 1 MG tablet Take 1 mg by mouth 3 (three) times daily as needed for anxiety or sleep.   Yes [provider]  amphetamine-dextroamphetamine (ADDERALL) 10 MG tablet Take 10-20 mg by mouth daily at 12 noon. 10/23/17  Yes [provider]  amphetamine-dextroamphetamine (ADDERALL) 20 MG tablet Take 40 mg by mouth every morning.  07/14/16  Yes [provider]  buPROPion (WELLBUTRIN SR) 150 MG 12 hr tablet Take 150 mg by mouth 2 (two) times daily. 10/03/17  Yes [provider]  dicyclomine (BENTYL) 20 MG tablet Take 20 mg by mouth 3 (three) times daily as needed for spasms. 03/30/21  Yes [provider]  diphenhydrAMINE (BENADRYL) 25 mg capsule Take 25 mg by mouth at bedtime.   Yes [provider]  famotidine (PEPCID) 40 MG tablet Take 40 mg by mouth 2 (two) times daily.   Yes  [provider]  furosemide (LASIX) 20 MG tablet Take 20 mg by mouth daily. 02/15/14  Yes [provider]  gabapentin (NEURONTIN) 600 MG tablet Take 600-1,800 mg by mouth See admin instructions. Take 1200 mg by mouth in the morning, 600 mg in the afternoon and 1200 mg at night   Yes [provider]  lamoTRIgine (LAMICTAL) 200 MG tablet Take 200 mg by mouth 2 (two) times daily.   Yes [provider]  methocarbamol (ROBAXIN) 750 MG tablet Take 750 mg by mouth 3 (three) times daily as needed for muscle spasms. 05/02/21  Yes [provider]  metoprolol succinate (TOPROL-XL) 50 MG 24 hr tablet Take 50 mg by mouth daily. 12/10/16  Yes [provider]  ondansetron (ZOFRAN) 8 MG tablet Take 8 mg by mouth every 8 (eight) hours as needed for nausea or vomiting.  11/24/13  Yes [provider]  oxyCODONE-acetaminophen (PERCOCET) 10-325 MG tablet Take 1 tablet by mouth every 4 (four) hours as needed for pain. 07/16/21  Yes Valeria Batman, MD  simvastatin (ZOCOR) 20 MG tablet Take 20 mg by mouth daily.  05/29/16  Yes [provider]  sitaGLIPtin (JANUVIA) 100 MG tablet Take 100 mg by mouth daily.   Yes [provider]  SUMAtriptan (IMITREX) 100 MG tablet Take 100 mg by mouth every 2 (two) hours as needed for migraine. 07/12/16  Yes [provider]  vortioxetine HBr (TRINTELLIX) 20 MG TABS tablet Take 20 mg by mouth daily.  Yes [provider]      Allergies    Bee venom, Cephalexin, Vancomycin, Equetro [carbamazepine], Adhesive [tape], Cefotaxime, Ciprofloxacin, Clindamycin, Latex, Penicillins, and Sulfa antibiotics    Review of Systems   Review of Systems  Constitutional:        Per HPI, otherwise negative  HENT:         Per HPI, otherwise negative  Respiratory:         Per HPI, otherwise negative  Cardiovascular:        Per HPI, otherwise negative  Gastrointestinal:  Negative for vomiting.  Endocrine:        Negative aside from HPI  Genitourinary:        Neg aside from HPI   Musculoskeletal:        Per HPI otherwise negative  Skin: Negative.   Neurological:  Negative for syncope.   Physical Exam Updated Vital Signs BP 139/71   Pulse (!) 55   Temp 98.2 F (36.8 C) (Oral)   Resp 13   Ht 5\' 4"  (1.626 m)   Wt 118.8 kg   LMP 05/06/2011   SpO2 100%   BMI 44.97 kg/m  Physical Exam Vitals and nursing note reviewed.  Constitutional:      General: She is not in acute distress.    Appearance: She is well-developed. She is obese. She is not ill-appearing or diaphoretic.  HENT:     Head: Normocephalic and atraumatic.  Eyes:     Conjunctiva/sclera: Conjunctivae normal.  Cardiovascular:     Rate and Rhythm: Normal rate and regular rhythm.  Pulmonary:     Effort: Pulmonary effort is normal. No respiratory distress.     Breath sounds: Normal breath sounds. No stridor.  Abdominal:     General: There is no distension.  Musculoskeletal:     Comments: Left arm in sling across the chest  Skin:    General: Skin is warm and dry.  Neurological:     Mental Status: She is alert and oriented to person, place, and time.     Cranial Nerves: No cranial nerve deficit.  Psychiatric:        Mood and Affect: Mood normal.    ED Results / Procedures / Treatments   Labs (all labs ordered are listed, but only abnormal results are displayed) Labs Reviewed  BASIC METABOLIC PANEL - Abnormal; Notable for the following components:      Result Value   Glucose, Bld 119 (*)    Calcium 8.7 (*)    All other components within normal limits  CBC - Abnormal; Notable for the following components:   Hemoglobin 11.4 (*)    All other components within normal limits  URINALYSIS, ROUTINE W REFLEX MICROSCOPIC - Abnormal; Notable for the following components:   APPearance HAZY (*)    Leukocytes,Ua MODERATE (*)    WBC, UA >50 (*)    Bacteria, UA FEW (*)    All other components within normal limits  TROPONIN I  (HIGH SENSITIVITY)  TROPONIN I (HIGH SENSITIVITY)    EKG EKG Interpretation  Date/Time:  Wednesday July 17 2021 17:05:44 EDT Ventricular Rate:  63 PR Interval:  188 QRS Duration: 86 QT Interval:  398 QTC Calculation: 407 R Axis:   -5 Text Interpretation: Normal sinus rhythm Inferior infarct , age undetermined Anterolateral infarct (cited on or before 17-Jul-2021) Abnormal ECG Confirmed by Gerhard Munch [phone]) on 07/17/2021 5:20:00 PM  Radiology DG Chest 1 View  Result Date: 07/17/2021 CLINICAL DATA:  Patient  had left rotator cuff surgery yesterday EXAM: CHEST  1 VIEW COMPARISON:  Chest radiograph dated July 30, 2020 FINDINGS: The heart is enlarged. Elevation of the left hemidiaphragm with left basilar atelectasis or infiltrate. Right lung is clear. Subcutaneous emphysema about the left shoulder, likely secondary to recent surgical intervention. IMPRESSION: 1.  The heart appears enlarged. 2. Elevation of the left hemidiaphragm with left basilar atelectasis or infiltrate. Electronically Signed   By: Larose Hires D.O.   On: 07/17/2021 17:44    Procedures Procedures    Medications Ordered in ED Medications  HYDROmorphone (DILAUDID) injection 1 mg (has no administration in time range)  HYDROmorphone (DILAUDID) injection 1 mg (1 mg Intravenous Given 07/17/21 2108)    ED Course/ Medical Decision Making/ A&P This patient presents to the ED for concern of intermittent chest pain post op., this involves an extensive number of treatment options, and is a complaint that carries with it a high risk of complications and morbidity.  The differential diagnosis includes recovery from nerve block, COPD exacerbation, atypical ACS, pneumonia.  Low suspicion for PE given the outpatient nature of the procedure, absence of risk factors otherwise   Co morbidities that complicate the patient evaluation  Obesity, COPD  Social Determinants of Health:  Obesity  Additional history  obtained:  Additional history and/or information obtained from husband External records from outside source obtained and reviewed including HPI   After the initial evaluation, orders, including: Labs x-ray EKG were initiated.   Patient placed on Cardiac and Pulse-Oximetry Monitors. The patient was maintained on a cardiac monitor.  The cardiac monitored showed an rhythm of sinus 60 normal The patient was also maintained on pulse oximetry. The readings were typically 99% room air normal   On repeat evaluation of the patient improved  Lab Tests:  I personally interpreted labs.  The pertinent results include: Moderate leukocytes in the urine, no nitrites, and she has no urinary complaints 2 troponins are normal, and with low risk heart score low suspicion for atypical ACS  Imaging Studies ordered:  I independently visualized and interpreted imaging which showed no pneumonia I agree with the radiologist interpretation    Dispostion / Final MDM:  After consideration of the diagnostic results and the patient's response to treatment, given improvement here, absence of hemodynamic instability, low suspicion for pneumonia, no evidence for PE on exam, no evidence for ACS, though she does have occasional chest pain there are some suspicion for this being postop secondary to her nerve block, patient discharged in stable condition.  Final Clinical Impression(s) / ED Diagnoses Final diagnoses:  Chest pain    Rx / DC Orders ED Discharge Orders     None         Gerhard Munch, MD 07/17/21 2316

## 2021-07-22 ENCOUNTER — Other Ambulatory Visit: Payer: Self-pay | Admitting: Orthopaedic Surgery

## 2021-07-22 MED ORDER — OXYCODONE-ACETAMINOPHEN 5-325 MG PO TABS
1.0000 | ORAL_TABLET | Freq: Three times a day (TID) | ORAL | 0 refills | Status: DC | PRN
Start: 2021-07-22 — End: 2022-02-10

## 2021-07-23 ENCOUNTER — Other Ambulatory Visit: Payer: Self-pay

## 2021-07-23 ENCOUNTER — Encounter: Payer: Self-pay | Admitting: Orthopaedic Surgery

## 2021-07-23 ENCOUNTER — Ambulatory Visit (INDEPENDENT_AMBULATORY_CARE_PROVIDER_SITE_OTHER): Payer: 59 | Admitting: Orthopaedic Surgery

## 2021-07-23 DIAGNOSIS — G8929 Other chronic pain: Secondary | ICD-10-CM

## 2021-07-23 DIAGNOSIS — M25512 Pain in left shoulder: Secondary | ICD-10-CM

## 2021-07-23 DIAGNOSIS — M75122 Complete rotator cuff tear or rupture of left shoulder, not specified as traumatic: Secondary | ICD-10-CM

## 2021-07-23 NOTE — Progress Notes (Signed)
? ?Office Visit Note ?  ?Patient: Molly Berry           ?Date of Birth: [birth year]           ?MRN: [id number] ?Visit Date: 07/23/2021 ?             ?Requested by: Nicholes Rough, PA-C ?[address] ?[place],  [place] [address] ?PCP: Nicholes Rough, PA-C ? ? ?Assessment & Plan: ?Visit Diagnoses:  ?1. Chronic left shoulder pain   ?2. Nontraumatic complete tear of left rotator cuff   ? ? ?Plan: Molly Berry is 1 week status post arthroscopic SCD and mini open rotator cuff tear repair and release of the biceps tendon.  She is actually doing well.  She had some issues with her breathing over the weekend and she went to the emergency room at [facility] and now she  says she is fine.  She is not having any fever or chills.  She does have chronic pain is on 40 to 60 mg of oxycodone on a daily basis through a pain clinic.  I have given her 2 prescriptions in surgery and asked her to follow-up with her pain clinic doctor from now on.  The wounds look great remove the dressing and applied Steri-Strips over benzoin.  We will start physical therapy at [facility] next week and see her back in 3 weeks.  She will have passive motion and use of her sling during that entire time ? ?Follow-Up Instructions: Return in about 3 weeks (around 08/13/2021).  ? ?Orders:  ?Orders Placed This Encounter  ?Procedures  ? Ambulatory referral to Occupational Therapy  ? ?No orders of the defined types were placed in this encounter. ? ? ? ? Procedures: ?No procedures performed ? ? ?Clinical Data: ?No additional findings. ? ? ?Subjective: ?Chief Complaint  ?Patient presents with  ? Left Shoulder - Follow-up  ?  Left shoulder arthroscopy 07/16/2021  ?Patient presents today for follow up on her left shoulder. She had a left shoulder arthroscopy on 07/16/2021. She is now one week out from surgery. Patient states that she has been having a lot of pain. She went to the ED since her surgery so that she could get some dilaudid to help with the pain. She is under a  pain management contract and is given hydrocodone 10/325 to take usually. She was confused at why her refill was for only hydrocodone 5/325.  ? ?HPI ? ?Review of Systems ? ? ?Objective: ?Vital Signs: LMP 05/06/2011  ? ?Physical Exam ? ?Ortho Exam left shoulder incisions healing without problem.  Old dressing removed and new Steri-Strips applied over the incision.  Motor and sensory exam intact.  No evidence of infection.  No erythema or ecchymosis. ? ?Specialty Comments:  ?No specialty comments available. ? ?Imaging: ?No results found. ? ? ?PMFS History: ?Patient Active Problem List  ? Diagnosis Date Noted  ? Complete tear of left rotator cuff 07/23/2021  ? Bee sting allergy 07/07/2016  ? Attention deficit disorder (ADD) in adult 07/07/2016  ? Chronic insomnia 07/07/2016  ? Vitamin D deficiency 07/07/2016  ? Migraine syndrome 07/07/2016  ? Osteoarthritis of both knees 07/07/2016  ? Fibromyalgia syndrome 07/07/2016  ? 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 HLD (hyperlipidemia) 07/07/2016  ? Morbid obesity due to excess calories ([place]) 03/10/2015  ? Diabetes mellitus without complication ([place]) 76/72/0947  ? Nontoxic multinodular goiter 03/09/2015  ? Essential hypertension, benign 03/09/2015  ? 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 IDA (iron deficiency  anemia) 12/27/2013  ? Ureteral calculus, left 11/17/2013  ? SOB (shortness of breath) 11/30/2012  ? ?Past Medical History:  ?Diagnosis Date  ? Allergy   ? Anemia   ? Anxiety   ? Arthritis   ? knees, multiple joints  ? Asthma   ? Bell palsy   ? Blood transfusion without reported diagnosis   ? Breast mass   ? lt breast mass x's 3 years increased in size  ? Chronic kidney disease   ? Chronic pain syndrome   ? COPD (chronic obstructive pulmonary disease) ([place])   ? Depression   ? Diabetes mellitus   ? Endometrial cancer ([place])   ? Fibromyalgia   ? GERD (gastroesophageal reflux disease)   ? 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 At [facility]  ? Headache   ? mirgraines  ? Hyperlipidemia   ?  Hypertension   ? Hypothyroidism   ? Multiple pulmonary nodules 11/30/2012  ? Followed in Pulmonary clinic/ [facility]/ [place]  - See CT abd  10/29/12  New right lower lobe pulmonary nodularity, primarily ground-  glass in density. This could reflect an inflammatory process,  although follow-up is necessary to exclude atypical neoplasm. Full  chest CT should be considered to evaluate for other pulmonary  findings.     ? Multiple thyroid nodules   ? Nephropathy   ? Pneumonia   ? PONV (postoperative nausea and vomiting)   ? Pulmonary nodules   ? Thyroid disease   ? Ulcer   ? Uterine cancer ([place]) 12/26/2011  ? Stage 1, grade 1, S/P hysterectomy initially by robotic technique and then salpingo-oophorectomy at [facility] receiving no postoperative treatment.  ?  ?Family History  ?Problem Relation Age of Onset  ? Pneumonia Mother   ?     Deceased  ? Arthritis Mother   ? Asthma Mother   ? Cancer Mother   ?     pancreatic  ? COPD Mother   ? Depression Mother   ? Diabetes Mother   ? Kidney disease Mother   ? Liver cancer Father   ?     Living  ? Arthritis Father   ? Cancer Father   ?     liver, prostate  ? COPD Father   ? Depression Father   ? Stroke Father   ? Heart disease Maternal Grandmother   ? Mental illness Maternal Grandmother   ? Breast cancer Maternal Grandmother   ? Alcohol abuse Paternal Grandfather   ? Breast cancer Paternal Aunt   ? Anesthesia problems Neg Hx   ? Hypotension Neg Hx   ? Malignant hyperthermia Neg Hx   ? Pseudochol deficiency Neg Hx   ? Colon cancer Neg Hx   ?  ?Past Surgical History:  ?Procedure Laterality Date  ? ABDOMINAL HYSTERECTOMY    ? endometrial cancer  ? CHOLECYSTECTOMY    ? COLONOSCOPY WITH PROPOFOL N/A 01/19/2014  ? OIN:[id number]  ? DILATION AND CURETTAGE OF UTERUS  12 yrs ago  ? DILATION AND CURETTAGE OF UTERUS  06/18/2011  ? Procedure: DILATATION AND CURETTAGE;  Surgeon: Florian Buff, MD;  Location: [facility];  Service: Gynecology;  Laterality: N/A;  Suction Dilation and Curettage  ?  ESOPHAGOGASTRODUODENOSCOPY N/A 10/12/2013  ? Dr. Rourk:anastomotic ulcer likely cause of bleeding. likely ischemic   ? ESOPHAGOGASTRODUODENOSCOPY (EGD) WITH PROPOFOL N/A 01/19/2014  ? JGG:[id number]  ? EXTRACORPOREAL SHOCK WAVE LITHOTRIPSY Left 01/11/2018  ? Procedure: LEFT EXTRACORPOREAL SHOCK WAVE LITHOTRIPSY (ESWL);  Surgeon: Lucas Mallow, MD;  Location: [facility];  Service: Urology;  Laterality: Left;  ? EXTRACORPOREAL SHOCK WAVE LITHOTRIPSY Left 01/14/2018  ? Procedure: LEFT EXTRACORPOREAL SHOCK WAVE LITHOTRIPSY (ESWL);  Surgeon: Cleon Gustin, MD;  Location: [facility];  Service: Urology;  Laterality: Left;  75 MINS ? W/ MAC  ? GASTRIC BYPASS  2013  ? [facility]  ? HYSTEROSCOPY WITH D & C  06/18/2011  ? Procedure: DILATATION AND CURETTAGE /HYSTEROSCOPY;  Surgeon: Florian Buff, MD;  Location: [facility];  Service: Gynecology;  Laterality: N/A;  ? LITHOTRIPSY    ? paniculectomy    ? SHOULDER ARTHROSCOPY WITH SUBACROMIAL DECOMPRESSION AND OPEN ROTATOR C Left 07/16/2021  ? Procedure: LEFT ARTHROSCOPIC SUBACROMIAL DECOMPRESSION, MINI OPEN ROTATOR CUFF TEAR REPAIR, BICEPS TENOTOMY;  Surgeon: [name], MD;  Location: [facility];  Service: Orthopedics;  Laterality: Left;  ? TRIGGER FINGER RELEASE    ? WISDOM TOOTH EXTRACTION    ? ?Social History  ? ?Occupational History  ? Occupation: Event organiser employed)  ?  Employer: NOT EMPLOYED  ?Tobacco Use  ? Smoking status: Never  ? Smokeless tobacco: Never  ?Vaping Use  ? Vaping Use: Never used  ?Substance and Sexual Activity  ? Alcohol use: No  ?  Alcohol/week: 0.0 standard drinks  ? Drug use: No  ? Sexual activity: Yes  ?  Birth control/protection: None, Surgical  ? ? ? ? ? ? ?

## 2021-08-01 ENCOUNTER — Ambulatory Visit (HOSPITAL_COMMUNITY): Payer: 59 | Attending: Orthopaedic Surgery

## 2021-08-01 ENCOUNTER — Encounter (HOSPITAL_COMMUNITY): Payer: Self-pay

## 2021-08-01 DIAGNOSIS — M25512 Pain in left shoulder: Secondary | ICD-10-CM | POA: Diagnosis present

## 2021-08-01 DIAGNOSIS — M25612 Stiffness of left shoulder, not elsewhere classified: Secondary | ICD-10-CM | POA: Diagnosis present

## 2021-08-01 DIAGNOSIS — R29898 Other symptoms and signs involving the musculoskeletal system: Secondary | ICD-10-CM | POA: Diagnosis present

## 2021-08-01 NOTE — Therapy (Signed)
?OUTPATIENT OCCUPATIONAL THERAPY ORTHO EVALUATION ? ?Patient Name: Molly Berry ?MRN: [id number] ?DOB:January 18, 1966, 56 y.o., female ?Today's Date: 08/01/2021 ? ?PCP: Nicholes Rough, PA-C ?REFERRING PROVIDER: [name], MD ? ? OT End of Session - 08/01/21 1550   ? ? Visit Number 1   ? Number of Visits 24   ? Date for OT Re-Evaluation 10/24/21   mini reassess:08/29/21  ? Authorization Type [employer]   ? OT Start Time 1430   ? OT Stop Time 1515   ? OT Time Calculation (min) 45 min   ? Activity Tolerance Patient tolerated treatment well   ? Behavior During Therapy [facility] for tasks assessed/performed   ? ?  ?  ? ?  ? ? ?Past Medical History:  ?Diagnosis Date  ? Allergy   ? Anemia   ? Anxiety   ? Arthritis   ? knees, multiple joints  ? Asthma   ? Bell palsy   ? Blood transfusion without reported diagnosis   ? Breast mass   ? lt breast mass x's 3 years increased in size  ? Chronic kidney disease   ? Chronic pain syndrome   ? COPD (chronic obstructive pulmonary disease) ([place])   ? Depression   ? Diabetes mellitus   ? Endometrial cancer ([place])   ? Fibromyalgia   ? GERD (gastroesophageal reflux disease)   ? 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 At [facility]  ? Headache   ? mirgraines  ? Hyperlipidemia   ? Hypertension   ? Hypothyroidism   ? Multiple pulmonary nodules 11/30/2012  ? Followed in Pulmonary clinic/ [facility]/ [place]  - See CT abd  10/29/12  New right lower lobe pulmonary nodularity, primarily ground-  glass in density. This could reflect an inflammatory process,  although follow-up is necessary to exclude atypical neoplasm. Full  chest CT should be considered to evaluate for other pulmonary  findings.     ? Multiple thyroid nodules   ? Nephropathy   ? Pneumonia   ? PONV (postoperative nausea and vomiting)   ? Pulmonary nodules   ? Thyroid disease   ? Ulcer   ? Uterine cancer ([place]) 12/26/2011  ? Stage 1, grade 1, S/P hysterectomy initially by robotic technique and then salpingo-oophorectomy at [facility] receiving  no postoperative treatment.  ? ?Past Surgical History:  ?Procedure Laterality Date  ? ABDOMINAL HYSTERECTOMY    ? endometrial cancer  ? CHOLECYSTECTOMY    ? COLONOSCOPY WITH PROPOFOL N/A 01/19/2014  ? [id number]  ? DILATION AND CURETTAGE OF UTERUS  12 yrs ago  ? DILATION AND CURETTAGE OF UTERUS  06/18/2011  ? Procedure: DILATATION AND CURETTAGE;  Surgeon: Florian Buff, MD;  Location: [facility];  Service: Gynecology;  Laterality: N/A;  Suction Dilation and Curettage  ? ESOPHAGOGASTRODUODENOSCOPY N/A 10/12/2013  ? Dr. Rourk:anastomotic ulcer likely cause of bleeding. likely ischemic   ? ESOPHAGOGASTRODUODENOSCOPY (EGD) WITH PROPOFOL N/A 01/19/2014  ? [id number]  ? EXTRACORPOREAL SHOCK WAVE LITHOTRIPSY Left 01/11/2018  ? Procedure: LEFT EXTRACORPOREAL SHOCK WAVE LITHOTRIPSY (ESWL);  Surgeon: Lucas Mallow, MD;  Location: [facility];  Service: Urology;  Laterality: Left;  ? EXTRACORPOREAL SHOCK WAVE LITHOTRIPSY Left 01/14/2018  ? Procedure: LEFT EXTRACORPOREAL SHOCK WAVE LITHOTRIPSY (ESWL);  Surgeon: Cleon Gustin, MD;  Location: [facility];  Service: Urology;  Laterality: Left;  75 MINS ? W/ MAC  ? GASTRIC BYPASS  2013  ? [facility]  ? HYSTEROSCOPY WITH D & C  06/18/2011  ? Procedure: DILATATION AND CURETTAGE /  HYSTEROSCOPY;  Surgeon: Florian Buff, MD;  Location: [facility];  Service: Gynecology;  Laterality: N/A;  ? LITHOTRIPSY    ? paniculectomy    ? SHOULDER ARTHROSCOPY WITH SUBACROMIAL DECOMPRESSION AND OPEN ROTATOR C Left 07/16/2021  ? Procedure: LEFT ARTHROSCOPIC SUBACROMIAL DECOMPRESSION, MINI OPEN ROTATOR CUFF TEAR REPAIR, BICEPS TENOTOMY;  Surgeon: [name], MD;  [facility];  Service: Orthopedics;  Laterality: Left;  ? TRIGGER FINGER RELEASE    ? WISDOM TOOTH EXTRACTION    ? ?Patient Active Problem List  ? Diagnosis Date Noted  ? Complete tear of left rotator cuff 07/23/2021  ? Bee sting allergy 07/07/2016  ? Attention deficit disorder (ADD) in adult 07/07/2016  ? Chronic insomnia 07/07/2016  ?  Vitamin D deficiency 07/07/2016  ? Migraine syndrome 07/07/2016  ? Osteoarthritis of both knees 07/07/2016  ? Fibromyalgia syndrome 07/07/2016  ? 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 HLD (hyperlipidemia) 07/07/2016  ? Morbid obesity due to excess calories ([place]) 03/10/2015  ? Diabetes mellitus without complication ([place]) 35/32/9924  ? Nontoxic multinodular goiter 03/09/2015  ? Essential hypertension, benign 03/09/2015  ? 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 IDA (iron deficiency anemia) 12/27/2013  ? Ureteral calculus, left 11/17/2013  ? SOB (shortness of breath) 11/30/2012  ? ? ?ONSET DATE: 07/16/21 ? ?REFERRING DIAG: S/P left shoulder arthroscopy ? ?THERAPY DIAG:  ?Other symptoms and signs involving the musculoskeletal system ? ?Acute pain of left shoulder ? ?Stiffness of left shoulder, not elsewhere classified ? ?SUBJECTIVE:  ? ?SUBJECTIVE STATEMENT: ?S: I forget that I can't use. I'll take my sling off when I'm sitting and it feels good that it makes me forget that I had surgery. ?Pt accompanied by: significant other ? ?PERTINENT HISTORY: Patient underwent a left shoulder arthroscopy on 07/16/21. She arrives to clinic with her husband and wearing a sling. Returns for follow up on 08/13/21 to Dr. Durward Fortes.  ? ?PRECAUTIONS: Shoulder Order states: P/ROM for 6 weeks (08/27/21) ? ?WEIGHT BEARING RESTRICTIONS Yes LUE LUE ? ?PAIN:  ?Are you having pain? Yes: NPRS scale: 4/10 ?Pain location: left shoulder ?Pain description: sore ?Aggravating factors: more activity. ?Relieving factors: ice, pain medication   ? ?FALLS: Has patient fallen in last 6 months? Yes. Number of falls too many to count.  Pt reports issues with vertigo which causes frequent falls. She has been planning to get it checked out.  ? ?LIVING ENVIRONMENT: ?Lives with: lives with their spouse ? ? ?PLOF: Independent ? ?PATIENT GOALS to return to using her left arm normal.  ? ?OBJECTIVE:  ? ?HAND DOMINANCE: Right ? ?ADLs: ?Overall ADLs: Pt is unable to utilize her left  arm for any daily tasks.  ? ? ?FUNCTIONAL OUTCOME MEASURES: ?Upper Extremity Functional Scale (UEFS): 1/80 ? ?UE ROM    ? ? ?Passive ROM ?Assessed supine. IR/er adducted Left ?08/01/2021  ?Shoulder flexion 93  ?Shoulder abduction 82  ?Shoulder internal rotation 80  ?Shoulder external rotation 35  ?(Blank rows = not tested) ? ?A/ROM not assessed due to precautions ?Active ROM Left ?08/01/2021  ?Shoulder flexion   ?Shoulder abduction   ?Shoulder internal rotation   ?Shoulder external rotation   ?(Blank rows = not tested) ? ? ? ?UE MMT:    ?MMT not assessed due to precautions ?MMT Left ?08/01/2021  ?Shoulder flexion   ?Shoulder abduction   ?Shoulder internal rotation   ?Shoulder external rotation   ?(Blank rows = not tested) ? ? ? ? ? ?COGNITION: ?Overall cognitive status: Within functional limits for tasks  assessed ? ? ?OBSERVATIONS: Max fascial restrictions noted in left upper arm, upper trapezius, and scapularis region. ? ? ? ? ?PATIENT EDUCATION: ?Education details: Precautions, proper sling wearing, HEP: Table slides, A/ROM elbow ?Person educated: Patient and Spouse ?Education method: Explanation, Demonstration, and Handouts ?Education comprehension: verbalized understanding and returned demonstration ? ? ?HOME EXERCISE PROGRAM: ?Eval: table slides, A/ROM elbow ? ?GOALS: ? ?SHORT TERM GOALS: Target date: 09/12/2021 ? ?Pt will increase her LUE P/ROM to [facility] in order to complete grooming and dressing tasks with less difficulty. ? ?Goal status: INITIAL ? ?2.  Pt will increase her LUE strength to 3+/5 in order to complete waist level reaching activities with less difficulty.  ? ?Goal status: INITIAL ? ?3.  Pt will report a pain level of approximately 5/10 or less when using her LUE for tasks such as grooming and dressing.  ? ?Goal status: INITIAL ? ?4.  Pt will decrease her left UE fascial restrictions to moderate amount in order to increase the functional mobility needed to complete bathing and dressing tasks.  ? ?Goal  status: INITIAL ? ?5.  Pt will be educated and independent with HEP in order to facilitate her progress in therapy and allow her to use her left arm for all dressing, bathing, and grooming tasks.  ? ?Goal

## 2021-08-01 NOTE — Patient Instructions (Signed)
SCAR MOBILIZATION - CIRCLES - MYOFASCIAL RELEASE ? ?Do not perform on fresh incision/cut to the skin. Wait until stitches are removed and scabs have fallen off by themselves before starting scar massage techniques. ? ?Place 2-3 fingers on your scar and press lightly while moving your fingers in small circles. Perform clockwise and counter clockwise as you move up and down the length of the scar.  ? ?Use gentle pressure to avoid reopening the scar site.      ? ? ? ? ? ? ?ELBOW FLEXION EXTENSION ? ?Start with your arm at your side. Bend at your elbow to raise your forearm/hand upwards as shown. Then return to starting position and repeat.     ? ? ? ?TOWEL SLIDES COMPLETE FOR 1-3 MINUTES, 3-5 TIMES PER DAY ? ?SHOULDER: Flexion On Table ? ? ?Place hands on table, elbows straight. Move hips away from body. Press hands down into table.  ? ?Abduction (Passive) ? ? ?With arm out to side, resting on table, lower head toward arm, keeping trunk away from table.  ?Copyright ? [employer]. All rights reserved.  ? ? ? ?Internal Rotation (Assistive) ? ? ?Seated with elbow bent at right angle and held against side, slide arm on table surface in an inward arc. ? ?Activity: Use this motion to brush crumbs off the table. ? ? ? ? ? ? ?

## 2021-08-02 ENCOUNTER — Ambulatory Visit (HOSPITAL_COMMUNITY): Payer: 59

## 2021-08-06 ENCOUNTER — Ambulatory Visit
Admission: EM | Admit: 2021-08-06 | Discharge: 2021-08-06 | Disposition: A | Discharge status: Home / Self Care | Payer: 59 | Attending: Family Medicine | Admitting: Family Medicine

## 2021-08-06 ENCOUNTER — Other Ambulatory Visit: Payer: Self-pay

## 2021-08-06 ENCOUNTER — Encounter: Payer: Self-pay | Admitting: Emergency Medicine

## 2021-08-06 DIAGNOSIS — N390 Urinary tract infection, site not specified: Secondary | ICD-10-CM | POA: Insufficient documentation

## 2021-08-06 LAB — POCT URINALYSIS DIP (MANUAL ENTRY)
Bilirubin, UA: NEGATIVE
Blood, UA: NEGATIVE
Glucose, UA: 100 mg/dL — AB
Ketones, POC UA: NEGATIVE mg/dL
Nitrite, UA: POSITIVE — AB
Spec Grav, UA: 1.02 (ref 1.010–1.025)
Urobilinogen, UA: 1 E.U./dL
pH, UA: 5 (ref 5.0–8.0)

## 2021-08-06 MED ORDER — NITROFURANTOIN MONOHYD MACRO 100 MG PO CAPS: 100.0000 mg | ORAL_CAPSULE | Freq: Two times a day (BID) | ORAL | 0 refills | Status: DC

## 2021-08-06 MED ORDER — PHENAZOPYRIDINE HCL 200 MG PO TABS: 200.0000 mg | ORAL_TABLET | Freq: Three times a day (TID) | ORAL | 0 refills | Status: DC | PRN

## 2021-08-06 NOTE — ED Triage Notes (Signed)
Pt reports right sided abdominal pain and nausea since last night. Pt reports took "urostat"and oxycodone last night and reports pain subsided ad then returned around 3am. Pt reports took another oxycodone and pain remained. Pt reports urinating less frequently and doesn't feel like bladder is empty after voiding. ?

## 2021-08-06 NOTE — ED Provider Notes (Signed)
?[facility] ? ? ? ?CSN: [id number] ?Arrival date & time: 08/06/21  3151 ? ? ?  ? ?History   ?Chief Complaint ?Chief Complaint  ?Patient presents with  ? Abdominal Pain  ? ? ?HPI ?Molly Berry is a 56 y.o. female.  ? ?Presenting today with 1 to 2-day history of lower abdominal pain, nausea, urinary hesitancy, difficulty fully voiding bladder.  Denies fever, chills, vomiting, change in bowel movements, vaginal symptoms.  Has been taking Uristat with minimal temporary relief.  History of frequent urinary tract infections that have felt similar. ? ? ?Past Medical History:  ?Diagnosis Date  ? Allergy   ? Anemia   ? Anxiety   ? Arthritis   ? knees, multiple joints  ? Asthma   ? Bell palsy   ? Blood transfusion without reported diagnosis   ? Breast mass   ? lt breast mass x's 3 years increased in size  ? Chronic kidney disease   ? Chronic pain syndrome   ? COPD (chronic obstructive pulmonary disease) ([place])   ? Depression   ? Diabetes mellitus   ? Endometrial cancer ([place])   ? Fibromyalgia   ? GERD (gastroesophageal reflux disease)   ? 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 At [facility]  ? Headache   ? mirgraines  ? Hyperlipidemia   ? Hypertension   ? Hypothyroidism   ? Multiple pulmonary nodules 11/30/2012  ? Followed in Pulmonary clinic/ [facility]/ [place]  - See CT abd  10/29/12  New right lower lobe pulmonary nodularity, primarily ground-  glass in density. This could reflect an inflammatory process,  although follow-up is necessary to exclude atypical neoplasm. Full  chest CT should be considered to evaluate for other pulmonary  findings.     ? Multiple thyroid nodules   ? Nephropathy   ? Pneumonia   ? PONV (postoperative nausea and vomiting)   ? Pulmonary nodules   ? Thyroid disease   ? Ulcer   ? Uterine cancer ([place]) 12/26/2011  ? Stage 1, grade 1, S/P hysterectomy initially by robotic technique and then salpingo-oophorectomy at [facility] receiving no postoperative treatment.  ? ? ?Patient Active Problem List  ?  Diagnosis Date Noted  ? Complete tear of left rotator cuff 07/23/2021  ? Bee sting allergy 07/07/2016  ? Attention deficit disorder (ADD) in adult 07/07/2016  ? Chronic insomnia 07/07/2016  ? Vitamin D deficiency 07/07/2016  ? Migraine syndrome 07/07/2016  ? Osteoarthritis of both knees 07/07/2016  ? Fibromyalgia syndrome 07/07/2016  ? 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 HLD (hyperlipidemia) 07/07/2016  ? Morbid obesity due to excess calories ([place]) 03/10/2015  ? Diabetes mellitus without complication ([place]) 76/16/0737  ? Nontoxic multinodular goiter 03/09/2015  ? Essential hypertension, benign 03/09/2015  ? 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 IDA (iron deficiency anemia) 12/27/2013  ? Ureteral calculus, left 11/17/2013  ? SOB (shortness of breath) 11/30/2012  ? ? ?Past Surgical History:  ?Procedure Laterality Date  ? ABDOMINAL HYSTERECTOMY    ? endometrial cancer  ? CHOLECYSTECTOMY    ? COLONOSCOPY WITH PROPOFOL N/A 01/19/2014  ? [id number]  ? DILATION AND CURETTAGE OF UTERUS  12 yrs ago  ? DILATION AND CURETTAGE OF UTERUS  06/18/2011  ? Procedure: DILATATION AND CURETTAGE;  Surgeon: Florian Buff, MD;  Location: [facility];  Service: Gynecology;  Laterality: N/A;  Suction Dilation and Curettage  ? ESOPHAGOGASTRODUODENOSCOPY N/A 10/12/2013  ? Dr. Rourk:anastomotic ulcer likely cause of bleeding. likely ischemic   ? ESOPHAGOGASTRODUODENOSCOPY (  EGD) WITH PROPOFOL N/A 01/19/2014  ? [id number]  ? EXTRACORPOREAL SHOCK WAVE LITHOTRIPSY Left 01/11/2018  ? Procedure: LEFT EXTRACORPOREAL SHOCK WAVE LITHOTRIPSY (ESWL);  Surgeon: Lucas Mallow, MD;  Location: [facility];  Service: Urology;  Laterality: Left;  ? EXTRACORPOREAL SHOCK WAVE LITHOTRIPSY Left 01/14/2018  ? Procedure: LEFT EXTRACORPOREAL SHOCK WAVE LITHOTRIPSY (ESWL);  Surgeon: Cleon Gustin, MD;  Location: [facility];  Service: Urology;  Laterality: Left;  75 MINS ? W/ MAC  ? GASTRIC BYPASS  2013  ? [facility]  ? HYSTEROSCOPY WITH D & C  06/18/2011  ? Procedure:  DILATATION AND CURETTAGE /HYSTEROSCOPY;  Surgeon: Florian Buff, MD;  Location: [facility];  Service: Gynecology;  Laterality: N/A;  ? LITHOTRIPSY    ? paniculectomy    ? SHOULDER ARTHROSCOPY WITH SUBACROMIAL DECOMPRESSION AND OPEN ROTATOR C Left 07/16/2021  ? Procedure: LEFT ARTHROSCOPIC SUBACROMIAL DECOMPRESSION, MINI OPEN ROTATOR CUFF TEAR REPAIR, BICEPS TENOTOMY;  Surgeon: [name], MD;  Location: [facility];  Service: Orthopedics;  Laterality: Left;  ? TRIGGER FINGER RELEASE    ? WISDOM TOOTH EXTRACTION    ? ? ?OB History   ?No obstetric history on file. ?  ? ? ? ?Home Medications   ? ?Prior to Admission medications   ?Medication Sig Start Date End Date Taking? Authorizing Provider  ?nitrofurantoin, macrocrystal-monohydrate, (MACROBID) 100 MG capsule Take 1 capsule (100 mg total) by mouth 2 (two) times daily. 08/06/21  Yes Volney American, PA-C  ?phenazopyridine (PYRIDIUM) 200 MG tablet Take 1 tablet (200 mg total) by mouth 3 (three) times daily as needed for pain. 08/06/21  Yes Volney American, PA-C  ?albuterol (PROVENTIL HFA;VENTOLIN HFA) 108 (90 BASE) MCG/ACT inhaler Inhale 2 puffs into the lungs every 6 (six) hours as needed for shortness of breath or wheezing.    [provider]  ?ALPRAZolam Duanne Moron) 1 MG tablet Take 1 mg by mouth 3 (three) times daily as needed for anxiety or sleep.    [provider]  ?amphetamine-dextroamphetamine (ADDERALL) 10 MG tablet Take 10-20 mg by mouth daily at 12 noon. 10/23/17   [provider]  ?amphetamine-dextroamphetamine (ADDERALL) 20 MG tablet Take 40 mg by mouth every morning.  07/14/16   [provider]  ?buPROPion (WELLBUTRIN SR) 150 MG 12 hr tablet Take 150 mg by mouth 2 (two) times daily. 10/03/17   [provider]  ?dicyclomine (BENTYL) 20 MG tablet Take 20 mg by mouth 3 (three) times daily as needed for spasms. 03/30/21   [provider]  ?diphenhydrAMINE (BENADRYL) 25 mg capsule Take 25 mg by mouth at  bedtime.    [provider]  ?famotidine (PEPCID) 40 MG tablet Take 40 mg by mouth 2 (two) times daily.    [provider]  ?furosemide (LASIX) 20 MG tablet Take 20 mg by mouth daily. 02/15/14   [provider]  ?gabapentin (NEURONTIN) 600 MG tablet Take 600-1,800 mg by mouth See admin instructions. Take 1200 mg by mouth in the morning, 600 mg in the afternoon and 1200 mg at night    [provider]  ?lamoTRIgine (LAMICTAL) 200 MG tablet Take 200 mg by mouth 2 (two) times daily.    [provider]  ?methocarbamol (ROBAXIN) 750 MG tablet Take 750 mg by mouth 3 (three) times daily as needed for muscle spasms. 05/02/21   [provider]  ?metoprolol succinate (TOPROL-XL) 50 MG 24 hr tablet Take 50 mg by mouth daily. 12/10/16   [provider]  ?  ondansetron (ZOFRAN) 8 MG tablet Take 8 mg by mouth every 8 (eight) hours as needed for nausea or vomiting.  11/24/13   [provider]  ?oxyCODONE-acetaminophen (PERCOCET) 10-325 MG tablet Take 1 tablet by mouth every 4 (four) hours as needed for pain. 07/16/21   [name], MD  ?oxyCODONE-acetaminophen (PERCOCET/ROXICET) 5-325 MG tablet Take 1 tablet by mouth every 8 (eight) hours as needed for severe pain. 07/22/21   [name], MD  ?simvastatin (ZOCOR) 20 MG tablet Take 20 mg by mouth daily.  05/29/16   [provider]  ?sitaGLIPtin (JANUVIA) 100 MG tablet Take 100 mg by mouth daily.    [provider]  ?SUMAtriptan (IMITREX) 100 MG tablet Take 100 mg by mouth every 2 (two) hours as needed for migraine. 07/12/16   [provider]  ?vortioxetine HBr (TRINTELLIX) 20 MG TABS tablet Take 20 mg by mouth daily.    [provider]  ? ? ?Family History ?Family History  ?Problem Relation Age of Onset  ? Pneumonia Mother   ?     Deceased  ? Arthritis Mother   ? Asthma Mother   ? Cancer Mother   ?     pancreatic  ? COPD Mother   ? Depression Mother   ? Diabetes Mother    ? Kidney disease Mother   ? Liver cancer Father   ?     Living  ? Arthritis Father   ? Cancer Father   ?     liver, prostate  ? COPD Father   ? Depression Father   ? Stroke Father   ? Heart disease Maternal Grandm

## 2021-08-07 ENCOUNTER — Telehealth (HOSPITAL_COMMUNITY): Payer: Self-pay | Admitting: Occupational Therapy

## 2021-08-07 ENCOUNTER — Encounter (HOSPITAL_COMMUNITY): Payer: 59 | Admitting: Occupational Therapy

## 2021-08-07 NOTE — Telephone Encounter (Signed)
S/w Husband patient has a bladder infection and will not be here today 08/07/21 ?

## 2021-08-09 ENCOUNTER — Ambulatory Visit (HOSPITAL_COMMUNITY): Payer: 59 | Attending: Orthopaedic Surgery | Admitting: Occupational Therapy

## 2021-08-09 ENCOUNTER — Encounter (HOSPITAL_COMMUNITY): Payer: Self-pay | Admitting: Occupational Therapy

## 2021-08-09 DIAGNOSIS — R29898 Other symptoms and signs involving the musculoskeletal system: Secondary | ICD-10-CM | POA: Diagnosis not present

## 2021-08-09 DIAGNOSIS — M25512 Pain in left shoulder: Secondary | ICD-10-CM | POA: Insufficient documentation

## 2021-08-09 DIAGNOSIS — M25612 Stiffness of left shoulder, not elsewhere classified: Secondary | ICD-10-CM | POA: Insufficient documentation

## 2021-08-09 LAB — URINE CULTURE: Culture: >=100000 — AB

## 2021-08-09 NOTE — Therapy (Signed)
?OUTPATIENT OCCUPATIONAL THERAPY TREATMENT NOTE ? ? ?Patient Name: Molly Berry ?MRN: [id number] ?DOB:02/28/1966, 56 y.o., female ?Today's Date: 08/09/2021 ? ?PCP: Nicholes Rough, PA-C ?REFERRING PROVIDER: Dr. Joni Fears ? ?END OF SESSION:  ? OT End of Session - 08/09/21 0935   ? ? Visit Number 2   ? Number of Visits 24   ? Date for OT Re-Evaluation 10/24/21   mini reassess:08/29/21  ? Authorization Type [employer]   ? Authorization Time Period covered 100% 30 visit limit PT/OT/Chiro with 0 used. Will nees to submit medical necessity if more is needed.   ? Authorization - Visit Number 2   ? Authorization - Number of Visits 30   ? OT Start Time [phone]   ? OT Stop Time [phone]   ? OT Time Calculation (min) 34 min   ? Activity Tolerance Patient tolerated treatment well   ? Behavior During Therapy [facility] for tasks assessed/performed   ? ?  ?  ? ?  ? ? ?Past Medical History:  ?Diagnosis Date  ? Allergy   ? Anemia   ? Anxiety   ? Arthritis   ? knees, multiple joints  ? Asthma   ? Bell palsy   ? Blood transfusion without reported diagnosis   ? Breast mass   ? lt breast mass x's 3 years increased in size  ? Chronic kidney disease   ? Chronic pain syndrome   ? COPD (chronic obstructive pulmonary disease) ([place])   ? Depression   ? Diabetes mellitus   ? Endometrial cancer ([place])   ? Fibromyalgia   ? GERD (gastroesophageal reflux disease)   ? 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 At [facility]  ? Headache   ? mirgraines  ? Hyperlipidemia   ? Hypertension   ? Hypothyroidism   ? Multiple pulmonary nodules 11/30/2012  ? Followed in Pulmonary clinic/ [facility]/ [place]  - See CT abd  10/29/12  New right lower lobe pulmonary nodularity, primarily ground-  glass in density. This could reflect an inflammatory process,  although follow-up is necessary to exclude atypical neoplasm. Full  chest CT should be considered to evaluate for other pulmonary  findings.     ? Multiple thyroid nodules   ? Nephropathy   ? Pneumonia   ? PONV (postoperative  nausea and vomiting)   ? Pulmonary nodules   ? Thyroid disease   ? Ulcer   ? Uterine cancer ([place]) 12/26/2011  ? Stage 1, grade 1, S/P hysterectomy initially by robotic technique and then salpingo-oophorectomy at [facility] receiving no postoperative treatment.  ? ?Past Surgical History:  ?Procedure Laterality Date  ? ABDOMINAL HYSTERECTOMY    ? endometrial cancer  ? CHOLECYSTECTOMY    ? COLONOSCOPY WITH PROPOFOL N/A 01/19/2014  ? [id number]  ? DILATION AND CURETTAGE OF UTERUS  12 yrs ago  ? DILATION AND CURETTAGE OF UTERUS  06/18/2011  ? Procedure: DILATATION AND CURETTAGE;  Surgeon: Florian Buff, MD;  Location: [facility];  Service: Gynecology;  Laterality: N/A;  Suction Dilation and Curettage  ? ESOPHAGOGASTRODUODENOSCOPY N/A 10/12/2013  ? Dr. Rourk:anastomotic ulcer likely cause of bleeding. likely ischemic   ? ESOPHAGOGASTRODUODENOSCOPY (EGD) WITH PROPOFOL N/A 01/19/2014  ? PXT:[id number]  ? EXTRACORPOREAL SHOCK WAVE LITHOTRIPSY Left 01/11/2018  ? Procedure: LEFT EXTRACORPOREAL SHOCK WAVE LITHOTRIPSY (ESWL);  Surgeon: Lucas Mallow, MD;  Location: [facility];  Service: Urology;  Laterality: Left;  ? EXTRACORPOREAL SHOCK WAVE LITHOTRIPSY Left 01/14/2018  ? Procedure: LEFT EXTRACORPOREAL SHOCK WAVE LITHOTRIPSY (ESWL);  Surgeon: Cleon Gustin, MD;  Location: [facility];  Service: Urology;  Laterality: Left;  75 MINS ? W/ MAC  ? GASTRIC BYPASS  2013  ? [facility]  ? HYSTEROSCOPY WITH D & C  06/18/2011  ? Procedure: DILATATION AND CURETTAGE /HYSTEROSCOPY;  Surgeon: Florian Buff, MD;  Location: [facility];  Service: Gynecology;  Laterality: N/A;  ? LITHOTRIPSY    ? paniculectomy    ? SHOULDER ARTHROSCOPY WITH SUBACROMIAL DECOMPRESSION AND OPEN ROTATOR C Left 07/16/2021  ? Procedure: LEFT ARTHROSCOPIC SUBACROMIAL DECOMPRESSION, MINI OPEN ROTATOR CUFF TEAR REPAIR, BICEPS TENOTOMY;  Surgeon: [name], MD;  Location: [facility];  Service: Orthopedics;  Laterality: Left;  ? TRIGGER FINGER RELEASE    ? WISDOM TOOTH EXTRACTION     ? ?Patient Active Problem List  ? Diagnosis Date Noted  ? Complete tear of left rotator cuff 07/23/2021  ? Bee sting allergy 07/07/2016  ? Attention deficit disorder (ADD) in adult 07/07/2016  ? Chronic insomnia 07/07/2016  ? Vitamin D deficiency 07/07/2016  ? Migraine syndrome 07/07/2016  ? Osteoarthritis of both knees 07/07/2016  ? Fibromyalgia syndrome 07/07/2016  ? 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 HLD (hyperlipidemia) 07/07/2016  ? Morbid obesity due to excess calories ([place]) 03/10/2015  ? Diabetes mellitus without complication ([place]) 04/27/8249  ? Nontoxic multinodular goiter 03/09/2015  ? Essential hypertension, benign 03/09/2015  ? 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 IDA (iron deficiency anemia) 12/27/2013  ? Ureteral calculus, left 11/17/2013  ? SOB (shortness of breath) 11/30/2012  ? ? ?ONSET DATE: 07/16/21 ? ?REFERRING DIAG: s/p left shoulder arthroplasty ? ?THERAPY DIAG:  ?Other symptoms and signs involving the musculoskeletal system ? ?Acute pain of left shoulder ? ?Stiffness of left shoulder, not elsewhere classified ? ? ?PERTINENT HISTORY: Patient underwent a left shoulder arthroscopy on 07/16/21. Returns for follow up on 08/13/21 to Dr. Durward Fortes.  ? ?PRECAUTIONS: Shoulder Order states: P/ROM for 6 weeks (08/27/21) ? ?SUBJECTIVE: S: It only glitches occasionally.  ? ?PAIN:  ?Are you having pain? No  ?Pain location:  ?Pain description:  ?Aggravating factors:  ?Relieving factors:  ? ? ? ? ?OBJECTIVE:  ? ? UE ROM    ?  ?  ?Passive ROM ?Assessed supine. IR/er adducted Left ?08/01/2021  ?Shoulder flexion 93  ?Shoulder abduction 82  ?Shoulder internal rotation 80  ?Shoulder external rotation 35  ?(Blank rows = not tested) ?  ?A/ROM not assessed due to precautions ?Active ROM Left ?08/01/2021  ?Shoulder flexion    ?Shoulder abduction    ?Shoulder internal rotation    ?Shoulder external rotation    ?(Blank rows = not tested) ?  ?  ?  ?UE MMT:    ?MMT not assessed due to precautions ?MMT Left ?08/01/2021  ?Shoulder  flexion    ?Shoulder abduction    ?Shoulder internal rotation    ?Shoulder external rotation    ?(Blank rows = not tested) ? ? ? ?TODAY'S TREATMENT: ?08/09/2021 ?-Myofascial release: completed separately from therapeutic exercises. Myofascial release to left upper arm, anterior shoulder, and trapezius regions to decrease pain and fascial restrictions, increase joint ROM ?-P/ROM: left shoulder flexion, abduction, er/IR, horizontal abduction, 10X each ?-Scapular A/ROM: row, extension, scapular depression, 10X each ?-Therapy ball exercises: flexion, abduction, 10X each ? ? ? ?SHORT TERM GOALS: Target date: 09/12/2021 ?  ?Pt will increase her LUE P/ROM to [facility] in order to complete grooming and dressing tasks with less difficulty. ?  ?Goal status: Ongoing ?  ?2.  Pt will increase her  LUE strength to 3+/5 in order to complete waist level reaching activities with less difficulty.  ?  ?Goal status: Ongoing ?  ?3.  Pt will report a pain level of approximately 5/10 or less when using her LUE for tasks such as grooming and dressing.  ?  ?Goal status:Ongoing ?  ?4.  Pt will decrease her left UE fascial restrictions to moderate amount in order to increase the functional mobility needed to complete bathing and dressing tasks.  ?  ?Goal status: Ongoing ?  ?5.  Pt will be educated and independent with HEP in order to facilitate her progress in therapy and allow her to use her left arm for all dressing, bathing, and grooming tasks.  ?  ?Goal status:Ongoing ?  ?  ?LONG TERM GOALS: Target date: 10/24/2021 ?  ?Pt will increase her LUE A/ROM to [facility] in order to complete overhead reaching tasks with less difficulty.  ?  ?Goal status: Ongoing ? ?2.  Pt will increase her LUE strength to 4+/5 in order to work towards returning to handling and/or riding show horses. ?  ?Goal status: Ongoing ?  ?3.  Pt will report a pain level of approximately 3/10 or less when using her Left arm to complete daily tasks.  ?  ?Goal status: Ongoing ?  ?4.  Pt will  decrease her left shoulder fascial restrictions to min amount or less in order to increase the functional mobility needed to complete reaching tasks.  ?  ?Goal status: Ongoing ? ? ? ?   ?OT Assessment and P

## 2021-08-12 ENCOUNTER — Encounter (HOSPITAL_COMMUNITY): Payer: Self-pay

## 2021-08-12 ENCOUNTER — Ambulatory Visit (HOSPITAL_COMMUNITY): Payer: 59

## 2021-08-12 DIAGNOSIS — R29898 Other symptoms and signs involving the musculoskeletal system: Secondary | ICD-10-CM | POA: Diagnosis not present

## 2021-08-12 DIAGNOSIS — M25512 Pain in left shoulder: Secondary | ICD-10-CM

## 2021-08-12 DIAGNOSIS — M25612 Stiffness of left shoulder, not elsewhere classified: Secondary | ICD-10-CM

## 2021-08-12 NOTE — Therapy (Signed)
?OUTPATIENT OCCUPATIONAL THERAPY TREATMENT NOTE ? ? ?Patient Name: Molly Berry ?MRN: [id number] ?DOB:07-16-1965, 56 y.o., female ?Today's Date: 08/12/2021 ? ?PCP: Nicholes Rough, PA-C ?REFERRING PROVIDER: Dr. Joni Fears ? ?END OF SESSION:  ? OT End of Session - 08/12/21 1521   ? ? Visit Number 3   ? Number of Visits 24   ? Date for OT Re-Evaluation 10/24/21   mini reassess:08/29/21  ? Authorization Type [employer]   ? Authorization Time Period covered 100% 30 visit limit PT/OT/Chiro with 0 used. Will nees to submit medical necessity if more is needed.   ? Authorization - Visit Number 3   ? Authorization - Number of Visits 30   ? OT Start Time 1515   ? OT Stop Time 2683   ? OT Time Calculation (min) 38 min   ? Activity Tolerance Patient tolerated treatment well   ? Behavior During Therapy [facility] for tasks assessed/performed   ? ?  ?  ? ?  ? ? ? ?Past Medical History:  ?Diagnosis Date  ? Allergy   ? Anemia   ? Anxiety   ? Arthritis   ? knees, multiple joints  ? Asthma   ? Bell palsy   ? Blood transfusion without reported diagnosis   ? Breast mass   ? lt breast mass x's 3 years increased in size  ? Chronic kidney disease   ? Chronic pain syndrome   ? COPD (chronic obstructive pulmonary disease) ([place])   ? Depression   ? Diabetes mellitus   ? Endometrial cancer ([place])   ? Fibromyalgia   ? GERD (gastroesophageal reflux disease)   ? 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 At [facility]  ? Headache   ? mirgraines  ? Hyperlipidemia   ? Hypertension   ? Hypothyroidism   ? Multiple pulmonary nodules 11/30/2012  ? Followed in Pulmonary clinic/ [facility]/ [place]  - See CT abd  10/29/12  New right lower lobe pulmonary nodularity, primarily ground-  glass in density. This could reflect an inflammatory process,  although follow-up is necessary to exclude atypical neoplasm. Full  chest CT should be considered to evaluate for other pulmonary  findings.     ? Multiple thyroid nodules   ? Nephropathy   ? Pneumonia   ? PONV  (postoperative nausea and vomiting)   ? Pulmonary nodules   ? Thyroid disease   ? Ulcer   ? Uterine cancer ([place]) 12/26/2011  ? Stage 1, grade 1, S/P hysterectomy initially by robotic technique and then salpingo-oophorectomy at [facility] receiving no postoperative treatment.  ? ?Past Surgical History:  ?Procedure Laterality Date  ? ABDOMINAL HYSTERECTOMY    ? endometrial cancer  ? CHOLECYSTECTOMY    ? COLONOSCOPY WITH PROPOFOL N/A 01/19/2014  ? [id number]  ? DILATION AND CURETTAGE OF UTERUS  12 yrs ago  ? DILATION AND CURETTAGE OF UTERUS  06/18/2011  ? Procedure: DILATATION AND CURETTAGE;  Surgeon: Florian Buff, MD;  Location: [facility];  Service: Gynecology;  Laterality: N/A;  Suction Dilation and Curettage  ? ESOPHAGOGASTRODUODENOSCOPY N/A 10/12/2013  ? Dr. Rourk:anastomotic ulcer likely cause of bleeding. likely ischemic   ? ESOPHAGOGASTRODUODENOSCOPY (EGD) WITH PROPOFOL N/A 01/19/2014  ? JHE:[id number]  ? EXTRACORPOREAL SHOCK WAVE LITHOTRIPSY Left 01/11/2018  ? Procedure: LEFT EXTRACORPOREAL SHOCK WAVE LITHOTRIPSY (ESWL);  Surgeon: Lucas Mallow, MD;  [facility];  Service: Urology;  Laterality: Left;  ? EXTRACORPOREAL SHOCK WAVE LITHOTRIPSY Left 01/14/2018  ? Procedure: LEFT EXTRACORPOREAL SHOCK WAVE LITHOTRIPSY (  ESWL);  Surgeon: Cleon Gustin, MD;  [facility];  Service: Urology;  Laterality: Left;  75 MINS ? W/ MAC  ? GASTRIC BYPASS  2013  ? [facility]  ? HYSTEROSCOPY WITH D & C  06/18/2011  ? Procedure: DILATATION AND CURETTAGE /HYSTEROSCOPY;  Surgeon: Florian Buff, MD;  Location: [facility];  Service: Gynecology;  Laterality: N/A;  ? LITHOTRIPSY    ? paniculectomy    ? SHOULDER ARTHROSCOPY WITH SUBACROMIAL DECOMPRESSION AND OPEN ROTATOR C Left 07/16/2021  ? Procedure: LEFT ARTHROSCOPIC SUBACROMIAL DECOMPRESSION, MINI OPEN ROTATOR CUFF TEAR REPAIR, BICEPS TENOTOMY;  Surgeon: [name], MD;  [facility];  Service: Orthopedics;  Laterality: Left;  ? TRIGGER FINGER RELEASE    ? WISDOM  TOOTH EXTRACTION    ? ?Patient Active Problem List  ? Diagnosis Date Noted  ? Complete tear of left rotator cuff 07/23/2021  ? Bee sting allergy 07/07/2016  ? Attention deficit disorder (ADD) in adult 07/07/2016  ? Chronic insomnia 07/07/2016  ? Vitamin D deficiency 07/07/2016  ? Migraine syndrome 07/07/2016  ? Osteoarthritis of both knees 07/07/2016  ? Fibromyalgia syndrome 07/07/2016  ? 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 HLD (hyperlipidemia) 07/07/2016  ? Morbid obesity due to excess calories ([place]) 03/10/2015  ? Diabetes mellitus without complication ([place]) 09/38/1829  ? Nontoxic multinodular goiter 03/09/2015  ? Essential hypertension, benign 03/09/2015  ? 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 IDA (iron deficiency anemia) 12/27/2013  ? Ureteral calculus, left 11/17/2013  ? SOB (shortness of breath) 11/30/2012  ? ? ?ONSET DATE: 07/16/21 ? ?REFERRING DIAG: s/p left shoulder arthroplasty ? ?THERAPY DIAG:  ?Other symptoms and signs involving the musculoskeletal system ? ?Acute pain of left shoulder ? ?Stiffness of left shoulder, not elsewhere classified ? ? ?PERTINENT HISTORY: Patient underwent a left shoulder arthroscopy on 07/16/21. Returns for follow up on 08/13/21 to Dr. Durward Fortes.  ? ?PRECAUTIONS: Shoulder Order states: P/ROM for 6 weeks (08/27/21) ? ?SUBJECTIVE: S: It feel like someone punched it. ? ?PAIN:  ?Are you having pain? 4  ?Pain location: left shoulder anterior ?Pain description: sore, tight, constant ?Aggravating factors: moving it forward ?Relieving factors: ice, pain medication ? ? ? ? ?OBJECTIVE:  ? ? UE ROM    ?  ?  ?Passive ROM ?Assessed supine. IR/er adducted Left ?08/01/2021 08/12/21  ?Shoulder flexion 93 138  ?Shoulder abduction 82 160  ?Shoulder internal rotation 80 90  ?Shoulder external rotation 35 80  ?(Blank rows = not tested) ?  ?A/ROM not assessed due to precautions ?Active ROM Left ?08/01/2021  ?Shoulder flexion    ?Shoulder abduction    ?Shoulder internal rotation    ?Shoulder external rotation     ?(Blank rows = not tested) ?  ?  ?  ?UE MMT:    ?MMT not assessed due to precautions ?MMT Left ?08/01/2021  ?Shoulder flexion    ?Shoulder abduction    ?Shoulder internal rotation    ?Shoulder external rotation    ?(Blank rows = not tested) ? ? ? ?TODAY'S TREATMENT: ?08/12/21 ?-Myofascial release: completed separately from therapeutic exercises. Myofascial release to left upper arm, anterior shoulder, and trapezius regions to decrease pain and fascial restrictions, increase joint ROM ?-P/ROM: left shoulder flexion, abduction, er/IR, horizontal abduction, 10X each ?-Scapular A/ROM: row, extension, scapular depression, 10X each ?-Therapy ball exercises: flexion, abduction, 10X each with 2" hold at end stretch ?- Thumb tacks; low level 1' ?- Pro/elev/dep/ret 1' low level ? ? ? ?08/09/2021 ?-Myofascial release: completed separately from therapeutic  exercises. Myofascial release to left upper arm, anterior shoulder, and trapezius regions to decrease pain and fascial restrictions, increase joint ROM ?-P/ROM: left shoulder flexion, abduction, er/IR, horizontal abduction, 10X each ?-Scapular A/ROM: row, extension, scapular depression, 10X each ?-Therapy ball exercises: flexion, abduction, 10X each ? ? ? ?SHORT TERM GOALS: Target date: 09/12/2021 ?  ?Pt will increase her LUE P/ROM to [facility] in order to complete grooming and dressing tasks with less difficulty. ?  ?Goal status: Ongoing ?  ?2.  Pt will increase her LUE strength to 3+/5 in order to complete waist level reaching activities with less difficulty.  ?  ?Goal status: Ongoing ?  ?3.  Pt will report a pain level of approximately 5/10 or less when using her LUE for tasks such as grooming and dressing.  ?  ?Goal status:Ongoing ?  ?4.  Pt will decrease her left UE fascial restrictions to moderate amount in order to increase the functional mobility needed to complete bathing and dressing tasks.  ?  ?Goal status: Ongoing ?  ?5.  Pt will be educated and independent with HEP in  order to facilitate her progress in therapy and allow her to use her left arm for all dressing, bathing, and grooming tasks.  ?  ?Goal status:Ongoing ?  ?  ?LONG TERM GOALS: Target date: 10/24/2021 ?  ?Pt will increase her

## 2021-08-13 ENCOUNTER — Telehealth: Payer: Self-pay | Admitting: Orthopaedic Surgery

## 2021-08-13 ENCOUNTER — Encounter: Payer: 59 | Admitting: Orthopaedic Surgery

## 2021-08-13 NOTE — Telephone Encounter (Signed)
Patient called advised she is not feeling well. Patient said she is having chills and sick on her stomach. Patient said she has a stomach bug. Patient said she would like to reschedule her appointment. The number to contact patient is [phone] ?

## 2021-08-14 ENCOUNTER — Ambulatory Visit (HOSPITAL_COMMUNITY): Payer: 59

## 2021-08-14 ENCOUNTER — Encounter (HOSPITAL_COMMUNITY): Payer: Self-pay

## 2021-08-14 DIAGNOSIS — R29898 Other symptoms and signs involving the musculoskeletal system: Secondary | ICD-10-CM | POA: Diagnosis not present

## 2021-08-14 DIAGNOSIS — M25512 Pain in left shoulder: Secondary | ICD-10-CM

## 2021-08-14 DIAGNOSIS — M25612 Stiffness of left shoulder, not elsewhere classified: Secondary | ICD-10-CM

## 2021-08-14 NOTE — Therapy (Signed)
?OUTPATIENT OCCUPATIONAL THERAPY TREATMENT NOTE ? ? ?Patient Name: STANA BAYON ?MRN: [id number] ?DOB:Nov 20, 1965, 56 y.o., female ?Today's Date: 08/14/2021 ? ?PCP: Nicholes Rough, PA-C ?REFERRING PROVIDER: Dr. Joni Fears ? ?END OF SESSION:  ? OT End of Session - 08/14/21 1019   ? ? Visit Number 4   ? Number of Visits 24   ? Date for OT Re-Evaluation 10/24/21   mini reassess:08/29/21  ? Authorization Type [employer]   ? Authorization Time Period covered 100% 30 visit limit PT/OT/Chiro with 0 used. Will nees to submit medical necessity if more is needed.   ? Authorization - Visit Number 4   ? Authorization - Number of Visits 30   ? OT Start Time 8882   pt arrived late  ? OT Stop Time 1027   ? OT Time Calculation (min) 30 min   ? Activity Tolerance Patient tolerated treatment well   ? Behavior During Therapy [facility] for tasks assessed/performed   ? ?  ?  ? ?  ? ? ? ? ?Past Medical History:  ?Diagnosis Date  ? Allergy   ? Anemia   ? Anxiety   ? Arthritis   ? knees, multiple joints  ? Asthma   ? Bell palsy   ? Blood transfusion without reported diagnosis   ? Breast mass   ? lt breast mass x's 3 years increased in size  ? Chronic kidney disease   ? Chronic pain syndrome   ? COPD (chronic obstructive pulmonary disease) ([place])   ? Depression   ? Diabetes mellitus   ? Endometrial cancer ([place])   ? Fibromyalgia   ? GERD (gastroesophageal reflux disease)   ? 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 At [facility]  ? Headache   ? mirgraines  ? Hyperlipidemia   ? Hypertension   ? Hypothyroidism   ? Multiple pulmonary nodules 11/30/2012  ? Followed in Pulmonary clinic/ [facility]/ [place]  - See CT abd  10/29/12  New right lower lobe pulmonary nodularity, primarily ground-  glass in density. This could reflect an inflammatory process,  although follow-up is necessary to exclude atypical neoplasm. Full  chest CT should be considered to evaluate for other pulmonary  findings.     ? Multiple thyroid nodules   ? Nephropathy   ? Pneumonia    ? PONV (postoperative nausea and vomiting)   ? Pulmonary nodules   ? Thyroid disease   ? Ulcer   ? Uterine cancer ([place]) 12/26/2011  ? Stage 1, grade 1, S/P hysterectomy initially by robotic technique and then [facility]oophorectomy at [facility] receiving no postoperative treatment.  ? ?Past Surgical History:  ?Procedure Laterality Date  ? ABDOMINAL HYSTERECTOMY    ? endometrial cancer  ? CHOLECYSTECTOMY    ? COLONOSCOPY WITH PROPOFOL N/A 01/19/2014  ? [id number]  ? DILATION AND CURETTAGE OF UTERUS  12 yrs ago  ? DILATION AND CURETTAGE OF UTERUS  06/18/2011  ? Procedure: DILATATION AND CURETTAGE;  Surgeon: Florian Buff, MD;  Location: [facility];  Service: Gynecology;  Laterality: N/A;  Suction Dilation and Curettage  ? ESOPHAGOGASTRODUODENOSCOPY N/A 10/12/2013  ? Dr. Rourk:anastomotic ulcer likely cause of bleeding. likely ischemic   ? ESOPHAGOGASTRODUODENOSCOPY (EGD) WITH PROPOFOL N/A 01/19/2014  ? [id number]  ? EXTRACORPOREAL SHOCK WAVE LITHOTRIPSY Left 01/11/2018  ? Procedure: LEFT EXTRACORPOREAL SHOCK WAVE LITHOTRIPSY (ESWL);  Surgeon: Lucas Mallow, MD;  Location: [facility];  Service: Urology;  Laterality: Left;  ? EXTRACORPOREAL SHOCK WAVE LITHOTRIPSY Left 01/14/2018  ? Procedure:  LEFT EXTRACORPOREAL SHOCK WAVE LITHOTRIPSY (ESWL);  Surgeon: Cleon Gustin, MD;  [facility]: [facility];  Service: Urology;  Laterality: Left;  75 MINS ? W/ MAC  ? GASTRIC BYPASS  2013  ? [facility]  ? HYSTEROSCOPY WITH D & C  06/18/2011  ? Procedure: DILATATION AND CURETTAGE /HYSTEROSCOPY;  Surgeon: Florian Buff, MD;  Location: [facility];  Service: Gynecology;  Laterality: N/A;  ? LITHOTRIPSY    ? paniculectomy    ? SHOULDER ARTHROSCOPY WITH SUBACROMIAL DECOMPRESSION AND OPEN ROTATOR C Left 07/16/2021  ? Procedure: LEFT ARTHROSCOPIC SUBACROMIAL DECOMPRESSION, MINI OPEN ROTATOR CUFF TEAR REPAIR, BICEPS TENOTOMY;  Surgeon: [name], MD;  [facility];  Service: Orthopedics;  Laterality: Left;  ? TRIGGER FINGER RELEASE    ?  WISDOM TOOTH EXTRACTION    ? ?Patient Active Problem List  ? Diagnosis Date Noted  ? Complete tear of left rotator cuff 07/23/2021  ? Bee sting allergy 07/07/2016  ? Attention deficit disorder (ADD) in adult 07/07/2016  ? Chronic insomnia 07/07/2016  ? Vitamin D deficiency 07/07/2016  ? Migraine syndrome 07/07/2016  ? Osteoarthritis of both knees 07/07/2016  ? Fibromyalgia syndrome 07/07/2016  ? 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 HLD (hyperlipidemia) 07/07/2016  ? Morbid obesity due to excess calories ([place]) 03/10/2015  ? Diabetes mellitus without complication ([place]) 10/26/7626  ? Nontoxic multinodular goiter 03/09/2015  ? Essential hypertension, benign 03/09/2015  ? 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 IDA (iron deficiency anemia) 12/27/2013  ? Ureteral calculus, left 11/17/2013  ? SOB (shortness of breath) 11/30/2012  ? ? ?ONSET DATE: 07/16/21 ? ?REFERRING DIAG: s/p left shoulder arthroplasty ? ?THERAPY DIAG:  ?Other symptoms and signs involving the musculoskeletal system ? ?Stiffness of left shoulder, not elsewhere classified ? ?Acute pain of left shoulder ? ? ?PERTINENT HISTORY: Patient underwent a left shoulder arthroscopy on 07/16/21. Returns for follow up on 08/20/21 to Dr. Durward Fortes.  ? ?PRECAUTIONS: Shoulder Order states: P/ROM for 6 weeks (08/27/21) ? ?SUBJECTIVE: S: It's hurting me more today. ? ?PAIN:  ?Are you having pain? 8/10  ?Pain location: left shoulder to upper arm ?Pain description: aching, sore, tight, constant ?Aggravating factors: has been sore since Monday. ?Relieving factors: ice  ? ? ? ? ?OBJECTIVE:  ? ? UE ROM    ?  ?  ?Passive ROM ?Assessed supine. IR/er adducted Left ?08/01/2021 08/12/21  ?Shoulder flexion 93 138  ?Shoulder abduction 82 160  ?Shoulder internal rotation 80 90  ?Shoulder external rotation 35 80  ?(Blank rows = not tested) ?  ?A/ROM not assessed due to precautions ?Active ROM Left ?08/01/2021  ?Shoulder flexion    ?Shoulder abduction    ?Shoulder internal rotation    ?Shoulder external  rotation    ?(Blank rows = not tested) ?  ?  ?  ?UE MMT:    ?MMT not assessed due to precautions ?MMT Left ?08/01/2021  ?Shoulder flexion    ?Shoulder abduction    ?Shoulder internal rotation    ?Shoulder external rotation    ?(Blank rows = not tested) ? ? ? ?TODAY'S TREATMENT: ?08/14/21 ?-Myofascial release: completed separately from therapeutic exercises. Myofascial release to left upper arm, anterior shoulder, and trapezius regions to decrease pain and fascial restrictions, increase joint ROM. Myofascial release also completed to left flexor carpi ulnaris to decrease fascial restrictions.  ?-P/ROM: left shoulder flexion, abduction, er/IR, 10X each ?-Therapy ball exercises: flexion, abduction, 10X each with 2" hold at end stretch ?- A/ROM: scapular, seated, depression, extension, 10X each ?-  Isometrics: shoulder, supine 3x3" flexion, extension, abduction, adduction, IR, er ? ? ? ?08/12/21 ?-Myofascial release: completed separately from therapeutic exercises. Myofascial release to left upper arm, anterior shoulder, and trapezius regions to decrease pain and fascial restrictions, increase joint ROM ?-P/ROM: left shoulder flexion, abduction, er/IR, horizontal abduction, 10X each ?-Scapular A/ROM: row, extension, scapular depression, 10X each ?-Therapy ball exercises: flexion, abduction, 10X each with 2" hold at end stretch ?- Thumb tacks; low level 1' ?- Pro/elev/dep/ret 1' low level ? ? ? ? ? ?SHORT TERM GOALS: Target date: 09/12/2021 ?  ?Pt will increase her LUE P/ROM to [facility] in order to complete grooming and dressing tasks with less difficulty. ?  ?Goal status: Ongoing ?  ?2.  Pt will increase her LUE strength to 3+/5 in order to complete waist level reaching activities with less difficulty.  ?  ?Goal status: Ongoing ?  ?3.  Pt will report a pain level of approximately 5/10 or less when using her LUE for tasks such as grooming and dressing.  ?  ?Goal status:Ongoing ?  ?4.  Pt will decrease her left UE fascial  restrictions to moderate amount in order to increase the functional mobility needed to complete bathing and dressing tasks.  ?  ?Goal status: Ongoing ?  ?5.  Pt will be educated and independent with HEP in order to

## 2021-08-20 ENCOUNTER — Encounter: Payer: Self-pay | Admitting: Orthopaedic Surgery

## 2021-08-20 ENCOUNTER — Ambulatory Visit (INDEPENDENT_AMBULATORY_CARE_PROVIDER_SITE_OTHER): Payer: 59 | Admitting: Orthopaedic Surgery

## 2021-08-20 DIAGNOSIS — M25512 Pain in left shoulder: Secondary | ICD-10-CM

## 2021-08-20 DIAGNOSIS — G8929 Other chronic pain: Secondary | ICD-10-CM

## 2021-08-20 NOTE — Progress Notes (Signed)
? ?Office Visit Note ?  ?Patient: Molly Berry           ?Date of Birth: 1965/05/09           ?MRN: [id number] ?Visit Date: 08/20/2021 ?             ?Requested by: Nicholes Rough, PA-C ?[address],  [place] [address] ?PCP: Nicholes Rough, PA-C ? ? ?Assessment & Plan: ?Visit Diagnoses: Left shoulder pain postop rotator cuff repair ? ?Plan: Patient is 5 weeks status post left shoulder arthroscopy subacromial decompression open rotator cuff repair biceps tenodesis.  She is doing well.  At this point we will progress her physical therapy as tolerated.  Until she is 6 weeks out from surgery she should continue with the sling when she is out and about.  Follow-up in 4 weeks ? ?Follow-Up Instructions: No follow-ups on file.  ? ?Orders:  ?No orders of the defined types were placed in this encounter. ? ?No orders of the defined types were placed in this encounter. ? ? ? ? Procedures: ?No procedures performed ? ? ?Clinical Data: ?No additional findings. ? ? ?Subjective: ?No chief complaint on file. ?Patient presents today for follow up on her left shoulder. She had a left shoulder arthroscopy on 07/16/2021. She is going to physical therapy twice weekly. She states that she is doing well.  ? ? ? ?Review of Systems  ?All other systems reviewed and are negative. ? ? ?Objective: ?Vital Signs: LMP 05/06/2011  ? ?Physical Exam ?Constitutional:   ?   Appearance: Normal appearance.  ?Neurological:  ?   Mental Status: She is alert.  ? ? ?Ortho Exam ?Examination of her left shoulder she has well-healed surgical incisions.  No erythema no swelling.  She has passive forward elevation to 175 degrees external rotation to 85 degrees without difficulty.  Distal pulses are in tact brisk capillary refill distal sensation is intact ?Specialty Comments:  ?No specialty comments available. ? ?Imaging: ?No results found. ? ? ?PMFS History: ?Patient Active Problem List  ? Diagnosis Date Noted  ? Complete tear of left rotator cuff 07/23/2021   ? Bee sting allergy 07/07/2016  ? Attention deficit disorder (ADD) in adult 07/07/2016  ? Chronic insomnia 07/07/2016  ? Vitamin D deficiency 07/07/2016  ? Migraine syndrome 07/07/2016  ? Osteoarthritis of both knees 07/07/2016  ? Fibromyalgia syndrome 07/07/2016  ? 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 HLD (hyperlipidemia) 07/07/2016  ? Morbid obesity due to excess calories ([place]) 03/10/2015  ? Diabetes mellitus without complication ([place]) 40/12/6759  ? Nontoxic multinodular goiter 03/09/2015  ? Essential hypertension, benign 03/09/2015  ? 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 IDA (iron deficiency anemia) 12/27/2013  ? Ureteral calculus, left 11/17/2013  ? SOB (shortness of breath) 11/30/2012  ? ?Past Medical History:  ?Diagnosis Date  ? Allergy   ? Anemia   ? Anxiety   ? Arthritis   ? knees, multiple joints  ? Asthma   ? Bell palsy   ? Blood transfusion without reported diagnosis   ? Breast mass   ? lt breast mass x's 3 years increased in size  ? Chronic kidney disease   ? Chronic pain syndrome   ? COPD (chronic obstructive pulmonary disease) ([place])   ? Depression   ? Diabetes mellitus   ? Endometrial cancer ([place])   ? Fibromyalgia   ? GERD (gastroesophageal reflux disease)   ? 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 At [facility]  ? Headache   ?  mirgraines  ? Hyperlipidemia   ? Hypertension   ? Hypothyroidism   ? Multiple pulmonary nodules 11/30/2012  ? Followed in Pulmonary clinic/ [facility]/ [place]  - See CT abd  10/29/12  New right lower lobe pulmonary nodularity, primarily ground-  glass in density. This could reflect an inflammatory process,  although follow-up is necessary to exclude atypical neoplasm. Full  chest CT should be considered to evaluate for other pulmonary  findings.     ? Multiple thyroid nodules   ? Nephropathy   ? Pneumonia   ? PONV (postoperative nausea and vomiting)   ? Pulmonary nodules   ? Thyroid disease   ? Ulcer   ? Uterine cancer ([place]) 12/26/2011  ? Stage 1, grade 1, S/P hysterectomy initially by  robotic technique and then sal[facility]oophorectomy at [facility] receiving no postoperative treatment.  ?  ?Family History  ?Problem Relation Age of Onset  ? Pneumonia Mother   ?     Deceased  ? Arthritis Mother   ? Asthma Mother   ? Cancer Mother   ?     pancreatic  ? COPD Mother   ? Depression Mother   ? Diabetes Mother   ? Kidney disease Mother   ? Liver cancer Father   ?     Living  ? Arthritis Father   ? Cancer Father   ?     liver, prostate  ? COPD Father   ? Depression Father   ? Stroke Father   ? Heart disease Maternal Grandmother   ? Mental illness Maternal Grandmother   ? Breast cancer Maternal Grandmother   ? Alcohol abuse Paternal Grandfather   ? Breast cancer Paternal Aunt   ? Anesthesia problems Neg Hx   ? Hypotension Neg Hx   ? Malignant hyperthermia Neg Hx   ? Pseudochol deficiency Neg Hx   ? Colon cancer Neg Hx   ?  ?Past Surgical History:  ?Procedure Laterality Date  ? ABDOMINAL HYSTERECTOMY    ? endometrial cancer  ? CHOLECYSTECTOMY    ? COLONOSCOPY WITH PROPOFOL N/A 01/19/2014  ? [id number]  ? DILATION AND CURETTAGE OF UTERUS  12 yrs ago  ? DILATION AND CURETTAGE OF UTERUS  06/18/2011  ? Procedure: DILATATION AND CURETTAGE;  Surgeon: Florian Buff, MD;  Location: [facility];  Service: Gynecology;  Laterality: N/A;  Suction Dilation and Curettage  ? ESOPHAGOGASTRODUODENOSCOPY N/A 10/12/2013  ? Dr. Rourk:anastomotic ulcer likely cause of bleeding. likely ischemic   ? ESOPHAGOGASTRODUODENOSCOPY (EGD) WITH PROPOFOL N/A 01/19/2014  ? [id number]  ? EXTRACORPOREAL SHOCK WAVE LITHOTRIPSY Left 01/11/2018  ? Procedure: LEFT EXTRACORPOREAL SHOCK WAVE LITHOTRIPSY (ESWL);  Surgeon: Lucas Mallow, MD;  Location: [facility];  Service: Urology;  Laterality: Left;  ? EXTRACORPOREAL SHOCK WAVE LITHOTRIPSY Left 01/14/2018  ? Procedure: LEFT EXTRACORPOREAL SHOCK WAVE LITHOTRIPSY (ESWL);  Surgeon: Cleon Gustin, MD;  Location: [facility];  Service: Urology;  Laterality: Left;  75 MINS ? W/ MAC  ? GASTRIC BYPASS  2013  ?  [facility]  ? HYSTEROSCOPY WITH D & C  06/18/2011  ? Procedure: DILATATION AND CURETTAGE /HYSTEROSCOPY;  Surgeon: Florian Buff, MD;  Location: [facility];  Service: Gynecology;  Laterality: N/A;  ? LITHOTRIPSY    ? paniculectomy    ? SHOULDER ARTHROSCOPY WITH SUBACROMIAL DECOMPRESSION AND OPEN ROTATOR C Left 07/16/2021  ? Procedure: LEFT ARTHROSCOPIC SUBACROMIAL DECOMPRESSION, MINI OPEN ROTATOR CUFF TEAR REPAIR, BICEPS TENOTOMY;  Surgeon: [name], MD;  Location: [facility];  Service:  Orthopedics;  Laterality: Left;  ? TRIGGER FINGER RELEASE    ? WISDOM TOOTH EXTRACTION    ? ?Social History  ? ?Occupational History  ? Occupation: Event organiser employed)  ?  Employer: NOT EMPLOYED  ?Tobacco Use  ? Smoking status: Never  ? Smokeless tobacco: Never  ?Vaping Use  ? Vaping Use: Never used  ?Substance and Sexual Activity  ? Alcohol use: No  ?  Alcohol/week: 0.0 standard drinks  ? Drug use: No  ? Sexual activity: Yes  ?  Birth control/protection: None, Surgical  ? ? ? ? ? ? ?

## 2021-08-21 ENCOUNTER — Ambulatory Visit (HOSPITAL_COMMUNITY): Payer: 59

## 2021-08-21 ENCOUNTER — Encounter (HOSPITAL_COMMUNITY): Payer: Self-pay

## 2021-08-21 DIAGNOSIS — R29898 Other symptoms and signs involving the musculoskeletal system: Secondary | ICD-10-CM

## 2021-08-21 DIAGNOSIS — M25512 Pain in left shoulder: Secondary | ICD-10-CM

## 2021-08-21 DIAGNOSIS — M25612 Stiffness of left shoulder, not elsewhere classified: Secondary | ICD-10-CM

## 2021-08-21 NOTE — Patient Instructions (Signed)

## 2021-08-21 NOTE — Therapy (Signed)
?OUTPATIENT OCCUPATIONAL THERAPY TREATMENT NOTE ? ? ?Patient Name: Molly Berry ?MRN: [id number] ?DOB:[birth year], 56 y.o., female ?Today's Date: 08/21/2021 ? ?PCP: Nicholes Rough, PA-C ?REFERRING PROVIDER: Dr. Joni Fears ? ?END OF SESSION:  ? OT End of Session - 08/21/21 0901   ? ? Visit Number 5   ? Number of Visits 24   ? Date for OT Re-Evaluation 10/24/21   mini reassess:08/29/21  ? Authorization Type [employer]   ? Authorization Time Period covered 100% 30 visit limit PT/OT/Chiro with 0 used. Will nees to submit medical necessity if more is needed.   ? Authorization - Visit Number 5   ? Authorization - Number of Visits 30   ? OT Start Time 0900   ? OT Stop Time [id number]   ? OT Time Calculation (min) 38 min   ? Activity Tolerance Patient tolerated treatment well   ? Behavior During Therapy [facility] for tasks assessed/performed   ? ?  ?  ? ?  ? ? ? ? ?Past Medical History:  ?Diagnosis Date  ? Allergy   ? Anemia   ? Anxiety   ? Arthritis   ? knees, multiple joints  ? Asthma   ? Bell palsy   ? Blood transfusion without reported diagnosis   ? Breast mass   ? lt breast mass x's 3 years increased in size  ? Chronic kidney disease   ? Chronic pain syndrome   ? COPD (chronic obstructive pulmonary disease) ([place])   ? Depression   ? Diabetes mellitus   ? Endometrial cancer ([place])   ? Fibromyalgia   ? GERD (gastroesophageal reflux disease)   ? 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 At [facility]  ? Headache   ? mirgraines  ? Hyperlipidemia   ? Hypertension   ? Hypothyroidism   ? Multiple pulmonary nodules 11/30/2012  ? Followed in Pulmonary clinic/ [facility]/ [place]  - See CT abd  10/29/12  New right lower lobe pulmonary nodularity, primarily ground-  glass in density. This could reflect an inflammatory process,  although follow-up is necessary to exclude atypical neoplasm. Full  chest CT should be considered to evaluate for other pulmonary  findings.     ? Multiple thyroid nodules   ? Nephropathy   ? Pneumonia   ? PONV  (postoperative nausea and vomiting)   ? Pulmonary nodules   ? Thyroid disease   ? Ulcer   ? Uterine cancer ([place]) 12/26/2011  ? Stage 1, grade 1, S/P hysterectomy initially by robotic technique and then sal[facility]oophorectomy at [facility] receiving no postoperative treatment.  ? ?Past Surgical History:  ?Procedure Laterality Date  ? ABDOMINAL HYSTERECTOMY    ? endometrial cancer  ? CHOLECYSTECTOMY    ? COLONOSCOPY WITH PROPOFOL N/A 01/19/2014  ? [id number]  ? DILATION AND CURETTAGE OF UTERUS  12 yrs ago  ? DILATION AND CURETTAGE OF UTERUS  06/18/2011  ? Procedure: DILATATION AND CURETTAGE;  Surgeon: Florian Buff, MD;  Location: [facility];  Service: Gynecology;  Laterality: N/A;  Suction Dilation and Curettage  ? ESOPHAGOGASTRODUODENOSCOPY N/A 10/12/2013  ? Dr. Rourk:anastomotic ulcer likely cause of bleeding. likely ischemic   ? ESOPHAGOGASTRODUODENOSCOPY (EGD) WITH PROPOFOL N/A 01/19/2014  ? [id number]  ? EXTRACORPOREAL SHOCK WAVE LITHOTRIPSY Left 01/11/2018  ? Procedure: LEFT EXTRACORPOREAL SHOCK WAVE LITHOTRIPSY (ESWL);  Surgeon: Lucas Mallow, MD;  [facility];  Service: Urology;  Laterality: Left;  ? EXTRACORPOREAL SHOCK WAVE LITHOTRIPSY Left 01/14/2018  ? Procedure: LEFT EXTRACORPOREAL SHOCK WAVE  LITHOTRIPSY (ESWL);  Surgeon: Cleon Gustin, MD;  [facility]: [facility];  Service: Urology;  Laterality: Left;  75 MINS ? W/ MAC  ? GASTRIC BYPASS  2013  ? [facility]  ? HYSTEROSCOPY WITH D & C  06/18/2011  ? Procedure: DILATATION AND CURETTAGE /HYSTEROSCOPY;  Surgeon: Florian Buff, MD;  Location: [facility];  Service: Gynecology;  Laterality: N/A;  ? LITHOTRIPSY    ? paniculectomy    ? SHOULDER ARTHROSCOPY WITH SUBACROMIAL DECOMPRESSION AND OPEN ROTATOR C Left 07/16/2021  ? Procedure: LEFT ARTHROSCOPIC SUBACROMIAL DECOMPRESSION, MINI OPEN ROTATOR CUFF TEAR REPAIR, BICEPS TENOTOMY;  Surgeon: [name], MD;  [facility];  Service: Orthopedics;  Laterality: Left;  ? TRIGGER FINGER RELEASE    ? WISDOM  TOOTH EXTRACTION    ? ?Patient Active Problem List  ? Diagnosis Date Noted  ? Complete tear of left rotator cuff 07/23/2021  ? Bee sting allergy 07/07/2016  ? Attention deficit disorder (ADD) in adult 07/07/2016  ? Chronic insomnia 07/07/2016  ? Vitamin D deficiency 07/07/2016  ? Migraine syndrome 07/07/2016  ? Osteoarthritis of both knees 07/07/2016  ? Fibromyalgia syndrome 07/07/2016  ? 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 HLD (hyperlipidemia) 07/07/2016  ? Morbid obesity due to excess calories ([place]) 03/10/2015  ? Diabetes mellitus without complication ([place]) 24/23/5361  ? Nontoxic multinodular goiter 03/09/2015  ? Essential hypertension, benign 03/09/2015  ? 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 IDA (iron deficiency anemia) 12/27/2013  ? Ureteral calculus, left 11/17/2013  ? SOB (shortness of breath) 11/30/2012  ? ? ?ONSET DATE: 07/16/21 ? ?REFERRING DIAG: s/p left shoulder arthroplasty ? ?THERAPY DIAG:  ?Other symptoms and signs involving the musculoskeletal system ? ?Stiffness of left shoulder, not elsewhere classified ? ?Acute pain of left shoulder ? ? ?PERTINENT HISTORY: Patient underwent a left shoulder arthroscopy on 07/16/21. Returns for follow up on 08/20/21 to Dr. Durward Fortes.  ? ?PRECAUTIONS: Shoulder; Progress as tolerated.  ? ?SUBJECTIVE: S: The Doctor was pleased.  ? ?PAIN:  ?Are you having pain? 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 ? ? ? ?OBJECTIVE:  ? ? UE ROM    ?  ?  ?Passive ROM ?Assessed supine. IR/er adducted Left ?08/01/2021 08/12/21  ?Shoulder flexion 93 138  ?Shoulder abduction 82 160  ?Shoulder internal rotation 80 90  ?Shoulder external rotation 35 80  ?(Blank rows = not tested) ?  ?A/ROM not assessed due to precautions ?Active ROM Left ?08/01/2021  ?Shoulder flexion    ?Shoulder abduction    ?Shoulder internal rotation    ?Shoulder external rotation    ?(Blank rows = not  tested) ?  ?  ?  ?UE MMT:    ?MMT not assessed due to precautions ?MMT Left ?08/01/2021  ?Shoulder flexion    ?Shoulder abduction    ?Shoulder internal rotation    ?Shoulder external rotation    ?(Blank rows = not tested) ? ? ? ?TODAY'S TREATMENT: ?08/21/21 ?-Myofascial release: completed separately from therapeutic exercises. Myofascial release to left upper arm, anterior shoulder, and trapezius regions to decrease pain and fascial restrictions, increase joint ROM. ?-P/ROM: supine, left shoulder all ranges, 5X each ?- AA/ROM: supine, left shoulder, protraction, IR/er, horiziontal abduction/adduction, flexion 10X each ?- AA/ROM: standing, left shoulder, abduction, 10X ?- Pulleys: 1' flexion ? ? ? ? ?08/14/21 ?-Myofascial release: completed separately from therapeutic exercises. Myofascial release to left upper arm, anterior shoulder, and  trapezius regions to decrease pain and fascial restrictions, increase joint ROM. Myofascial release also completed to left flexor carpi ulnaris to decrease fascial restrictions.  ?-P/ROM: left shoulder flexion, abduction, er/IR, 10X each ?-Therapy ball exercises: flexion, abduction, 10X each with 2" hold at end stretch ?- A/ROM: scapular, seated, depression, extension, 10X each ?- Isometrics: shoulder, supine 3x3" flexion, extension, abduction, adduction, IR, er ? ? ? ?08/12/21 ?-Myofascial release: completed separately from therapeutic exercises. Myofascial release to left upper arm, anterior shoulder, and trapezius regions to decrease pain and fascial restrictions, increase joint ROM ?-P/ROM: left shoulder flexion, abduction, er/IR, horizontal abduction, 10X each ?-Scapular A/ROM: row, extension, scapular depression, 10X each ?-Therapy ball exercises: flexion, abduction, 10X each with 2" hold at end stretch ?- Thumb tacks; low level 1' ?- Pro/elev/dep/ret 1' low level ? ? ? ?PATIENT EDUCATION: ?Education details: AA/ROM shoulder ?Person educated: Patient ?Education method:  Explanation, Demonstration, Tactile cues, Verbal cues, and Handouts ?Education comprehension: verbalized understanding and returned demonstration  ? ? ?SHORT TERM GOALS: Target date: 09/12/2021 ?  ?Pt will increase her LUE P/RO

## 2021-08-23 ENCOUNTER — Encounter (HOSPITAL_COMMUNITY): Payer: 59

## 2021-08-28 ENCOUNTER — Ambulatory Visit (HOSPITAL_COMMUNITY): Payer: 59 | Admitting: Occupational Therapy

## 2021-08-28 ENCOUNTER — Telehealth (HOSPITAL_COMMUNITY): Payer: Self-pay | Admitting: Occupational Therapy

## 2021-08-28 NOTE — Telephone Encounter (Signed)
Pt reported not coming to session due to insruance issues. Pt was transferred to the front desk to resolve or discuss issue.  ? ?Phala Schraeder OT, MOT ? ?

## 2021-08-30 ENCOUNTER — Ambulatory Visit (HOSPITAL_COMMUNITY): Payer: 59 | Admitting: Occupational Therapy

## 2021-09-03 ENCOUNTER — Encounter (HOSPITAL_COMMUNITY): Payer: Self-pay | Admitting: Hematology and Oncology

## 2021-09-03 ENCOUNTER — Ambulatory Visit (HOSPITAL_COMMUNITY): Payer: 59 | Attending: Orthopaedic Surgery | Admitting: Occupational Therapy

## 2021-09-03 ENCOUNTER — Encounter (HOSPITAL_COMMUNITY): Payer: Self-pay | Admitting: Occupational Therapy

## 2021-09-03 DIAGNOSIS — M25512 Pain in left shoulder: Secondary | ICD-10-CM | POA: Diagnosis not present

## 2021-09-03 DIAGNOSIS — R29898 Other symptoms and signs involving the musculoskeletal system: Secondary | ICD-10-CM | POA: Insufficient documentation

## 2021-09-03 DIAGNOSIS — M25612 Stiffness of left shoulder, not elsewhere classified: Secondary | ICD-10-CM | POA: Insufficient documentation

## 2021-09-03 NOTE — Therapy (Signed)
?OUTPATIENT OCCUPATIONAL THERAPY TREATMENT NOTE ? ? ?Patient Name: Molly Berry ?MRN: [id number] ?DOB:Jul 08, 1965, 56 y.o., female ?Today's Date: 09/03/2021 ? ?PCP: Nicholes Rough, PA-C ?REFERRING PROVIDER: Dr. Joni Fears ? ?END OF SESSION:  ? OT End of Session - 09/03/21 1512   ? ? Visit Number 6   ? Number of Visits 24   ? Date for OT Re-Evaluation 10/24/21   mini reassess:08/29/21  ? Authorization Type [employer]   ? Authorization Time Period covered 100% 30 visit limit PT/OT/Chiro with 0 used. Will nees to submit medical necessity if more is needed.   ? Authorization - Visit Number 6   ? Authorization - Number of Visits 30   ? OT Start Time 1352   ? OT Stop Time 1426   ? OT Time Calculation (min) 34 min   ? Activity Tolerance Patient tolerated treatment well   ? Behavior During Therapy [facility] for tasks assessed/performed   ? ?  ?  ? ?  ? ? ? ? ? ?Past Medical History:  ?Diagnosis Date  ? Allergy   ? Anemia   ? Anxiety   ? Arthritis   ? knees, multiple joints  ? Asthma   ? Bell palsy   ? Blood transfusion without reported diagnosis   ? Breast mass   ? lt breast mass x's 3 years increased in size  ? Chronic kidney disease   ? Chronic pain syndrome   ? COPD (chronic obstructive pulmonary disease) ([place])   ? Depression   ? Diabetes mellitus   ? Endometrial cancer ([place])   ? Fibromyalgia   ? GERD (gastroesophageal reflux disease)   ? 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 At [facility]  ? Headache   ? mirgraines  ? Hyperlipidemia   ? Hypertension   ? Hypothyroidism   ? Multiple pulmonary nodules 11/30/2012  ? Followed in Pulmonary clinic/ [facility]/ [place]  - See CT abd  10/29/12  New right lower lobe pulmonary nodularity, primarily ground-  glass in density. This could reflect an inflammatory process,  although follow-up is necessary to exclude atypical neoplasm. Full  chest CT should be considered to evaluate for other pulmonary  findings.     ? Multiple thyroid nodules   ? Nephropathy   ? Pneumonia   ? PONV  (postoperative nausea and vomiting)   ? Pulmonary nodules   ? Thyroid disease   ? Ulcer   ? Uterine cancer ([place]) 12/26/2011  ? Stage 1, grade 1, S/P hysterectomy initially by robotic technique and then salpingo[facility]oophorectomy at [facility] receiving no postoperative treatment.  ? ?Past Surgical History:  ?Procedure Laterality Date  ? ABDOMINAL HYSTERECTOMY    ? endometrial cancer  ? CHOLECYSTECTOMY    ? COLONOSCOPY WITH PROPOFOL N/A 01/19/2014  ? [id number]  ? DILATION AND CURETTAGE OF UTERUS  12 yrs ago  ? DILATION AND CURETTAGE OF UTERUS  06/18/2011  ? Procedure: DILATATION AND CURETTAGE;  Surgeon: Florian Buff, MD;  Location: [facility];  Service: Gynecology;  Laterality: N/A;  Suction Dilation and Curettage  ? ESOPHAGOGASTRODUODENOSCOPY N/A 10/12/2013  ? Dr. Rourk:anastomotic ulcer likely cause of bleeding. likely ischemic   ? ESOPHAGOGASTRODUODENOSCOPY (EGD) WITH PROPOFOL N/A 01/19/2014  ? [id number]  ? EXTRACORPOREAL SHOCK WAVE LITHOTRIPSY Left 01/11/2018  ? Procedure: LEFT EXTRACORPOREAL SHOCK WAVE LITHOTRIPSY (ESWL);  Surgeon: Lucas Mallow, MD;  Location: [facility];  Service: Urology;  Laterality: Left;  ? EXTRACORPOREAL SHOCK WAVE LITHOTRIPSY Left 01/14/2018  ? Procedure: LEFT EXTRACORPOREAL SHOCK  WAVE LITHOTRIPSY (ESWL);  Surgeon: Cleon Gustin, MD;  Location: [facility];  Service: Urology;  Laterality: Left;  75 MINS ? W/ MAC  ? GASTRIC BYPASS  2013  ? [facility]  ? HYSTEROSCOPY WITH D & C  06/18/2011  ? Procedure: DILATATION AND CURETTAGE /HYSTEROSCOPY;  Surgeon: Florian Buff, MD;  Location: [facility];  Service: Gynecology;  Laterality: N/A;  ? LITHOTRIPSY    ? paniculectomy    ? SHOULDER ARTHROSCOPY WITH SUBACROMIAL DECOMPRESSION AND OPEN ROTATOR C Left 07/16/2021  ? Procedure: LEFT ARTHROSCOPIC SUBACROMIAL DECOMPRESSION, MINI OPEN ROTATOR CUFF TEAR REPAIR, BICEPS TENOTOMY;  Surgeon: [name], MD;  Location: [facility];  Service: Orthopedics;  Laterality: Left;  ? TRIGGER FINGER RELEASE    ? WISDOM  TOOTH EXTRACTION    ? ?Patient Active Problem List  ? Diagnosis Date Noted  ? Complete tear of left rotator cuff 07/23/2021  ? Bee sting allergy 07/07/2016  ? Attention deficit disorder (ADD) in adult 07/07/2016  ? Chronic insomnia 07/07/2016  ? Vitamin D deficiency 07/07/2016  ? Migraine syndrome 07/07/2016  ? Osteoarthritis of both knees 07/07/2016  ? Fibromyalgia syndrome 07/07/2016  ? 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 HLD (hyperlipidemia) 07/07/2016  ? Morbid obesity due to excess calories ([place]) 03/10/2015  ? Diabetes mellitus without complication ([place]) 16/02/9603  ? Nontoxic multinodular goiter 03/09/2015  ? Essential hypertension, benign 03/09/2015  ? 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 IDA (iron deficiency anemia) 12/27/2013  ? Ureteral calculus, left 11/17/2013  ? SOB (shortness of breath) 11/30/2012  ? ? ?ONSET DATE: 07/16/21 ? ?REFERRING DIAG: s/p left shoulder arthroplasty ? ?THERAPY DIAG:  ?Other symptoms and signs involving the musculoskeletal system ? ?Stiffness of left shoulder, not elsewhere classified ? ?Acute pain of left shoulder ? ? ?PERTINENT HISTORY: Patient underwent a left shoulder arthroscopy on 07/16/21. Returns for follow up on 08/20/21 to Dr. Durward Fortes.  ? ?PRECAUTIONS: Shoulder; Progress as tolerated.  ? ?SUBJECTIVE: S: Everything is hurting today.  ? ?PAIN:  ?Are you having pain? 9/10  ?Pain location: left shoulder to upper arm ?Pain description: sore, tight, constant ?Aggravating factors: hanging clothes at her [employer] ?Relieving factors: ice  ? ? ? ? ?OBJECTIVE:  ? ? UE ROM    ?  ?  ?Passive ROM ?Assessed supine. IR/er adducted Left ?08/01/2021 08/12/21  ?Shoulder flexion 93 138  ?Shoulder abduction 82 160  ?Shoulder internal rotation 80 90  ?Shoulder external rotation 35 80  ?(Blank rows = not tested) ?  ?A/ROM not assessed due to precautions ?Active ROM Left ?08/01/2021  ?Shoulder flexion    ?Shoulder abduction    ?Shoulder internal rotation    ?Shoulder external rotation    ?(Blank rows =  not tested) ?  ?  ?  ?UE MMT:    ?MMT not assessed due to precautions ?MMT Left ?08/01/2021  ?Shoulder flexion    ?Shoulder abduction    ?Shoulder internal rotation    ?Shoulder external rotation    ?(Blank rows = not tested) ? ? ? ?TODAY'S TREATMENT: ?09/03/2021 ?-P/ROM: supine, left shoulder all ranges, 5X each ?-AA/ROM: supine, left shoulder, protraction, IR/er, horiziontal abduction/adduction, flexion 10X each ?-AA/ROM: seated, left shoulder, protraction, IR/er, flexion 10X each ?-Scapular A/ROM: seated, row, extension, 10X each ?-Wall wash: 1' flexion ?-Pulleys: 1' flexion, 1' abduction ? ? ?08/21/21 ?-Myofascial release: completed separately from therapeutic exercises. Myofascial release to left upper arm, anterior shoulder, and trapezius regions to decrease pain and fascial restrictions, increase joint ROM. ?-P/ROM: supine, left shoulder  all ranges, 5X each ?- AA/ROM: supine, left shoulder, protraction, IR/er, horiziontal abduction/adduction, flexion 10X each ?- AA/ROM: standing, left shoulder, abduction, 10X ?- Pulleys: 1' flexion ? ? ?08/14/21 ?-Myofascial release: completed separately from therapeutic exercises. Myofascial release to left upper arm, anterior shoulder, and trapezius regions to decrease pain and fascial restrictions, increase joint ROM. Myofascial release also completed to left flexor carpi ulnaris to decrease fascial restrictions.  ?-P/ROM: left shoulder flexion, abduction, er/IR, 10X each ?-Therapy ball exercises: flexion, abduction, 10X each with 2" hold at end stretch ?- A/ROM: scapular, seated, depression, extension, 10X each ?- Isometrics: shoulder, supine 3x3" flexion, extension, abduction, adduction, IR, er ? ? ? ?PATIENT EDUCATION: ?Education details: AA/ROM shoulder ?Person educated: Patient ?Education method: Explanation, Demonstration, Tactile cues, Verbal cues, and Handouts ?Education comprehension: verbalized understanding and returned demonstration  ? ? ?SHORT TERM GOALS: Target  date: 09/12/2021 ?  ?Pt will increase her LUE P/ROM to [facility] in order to complete grooming and dressing tasks with less difficulty. ?  ?Goal status: Ongoing ?  ?2.  Pt will increase her LUE strength to 3+/5 in order to

## 2021-09-06 ENCOUNTER — Encounter (HOSPITAL_COMMUNITY): Payer: Self-pay | Admitting: Occupational Therapy

## 2021-09-06 ENCOUNTER — Ambulatory Visit (HOSPITAL_COMMUNITY): Payer: 59 | Admitting: Occupational Therapy

## 2021-09-06 DIAGNOSIS — R29898 Other symptoms and signs involving the musculoskeletal system: Secondary | ICD-10-CM | POA: Diagnosis not present

## 2021-09-06 DIAGNOSIS — M25512 Pain in left shoulder: Secondary | ICD-10-CM | POA: Diagnosis not present

## 2021-09-06 DIAGNOSIS — M25612 Stiffness of left shoulder, not elsewhere classified: Secondary | ICD-10-CM | POA: Diagnosis not present

## 2021-09-06 NOTE — Therapy (Signed)
?OUTPATIENT OCCUPATIONAL THERAPY TREATMENT NOTE ? ? ?Patient Name: Molly Berry ?MRN: [id number] ?DOB:[birth year], 56 y.o., female ?Today's Date: 09/06/2021 ? ?PCP: Nicholes Rough, PA-C ?REFERRING PROVIDER: Dr. Joni Fears ? ?END OF SESSION:  ? OT End of Session - 09/06/21 1504   ? ? Visit Number 7   ? Number of Visits 24   ? Date for OT Re-Evaluation 10/24/21   mini reassess:08/29/21  ? Authorization Type [employer]   ? Authorization Time Period covered 100% 30 visit limit PT/OT/Chiro with 0 used. Will nees to submit medical necessity if more is needed.   ? Authorization - Visit Number 7   ? Authorization - Number of Visits 30   ? OT Start Time 0272   pt arrived late  ? OT Stop Time 1502   ? OT Time Calculation (min) 26 min   ? Activity Tolerance Patient tolerated treatment well   ? Behavior During Therapy [facility] for tasks assessed/performed   ? ?  ?  ? ?  ? ? ? ? ? ? ?Past Medical History:  ?Diagnosis Date  ? Allergy   ? Anemia   ? Anxiety   ? Arthritis   ? knees, multiple joints  ? Asthma   ? Bell palsy   ? Blood transfusion without reported diagnosis   ? Breast mass   ? lt breast mass x's 3 years increased in size  ? Chronic kidney disease   ? Chronic pain syndrome   ? COPD (chronic obstructive pulmonary disease) ([place])   ? Depression   ? Diabetes mellitus   ? Endometrial cancer ([place])   ? Fibromyalgia   ? GERD (gastroesophageal reflux disease)   ? 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 At [facility]  ? Headache   ? mirgraines  ? Hyperlipidemia   ? Hypertension   ? Hypothyroidism   ? Multiple pulmonary nodules 11/30/2012  ? Followed in Pulmonary clinic/ [facility]/ [place]  - See CT abd  10/29/12  New right lower lobe pulmonary nodularity, primarily ground-  glass in density. This could reflect an inflammatory process,  although follow-up is necessary to exclude atypical neoplasm. Full  chest CT should be considered to evaluate for other pulmonary  findings.     ? Multiple thyroid nodules   ? Nephropathy   ?  Pneumonia   ? PONV (postoperative nausea and vomiting)   ? Pulmonary nodules   ? Thyroid disease   ? Ulcer   ? Uterine cancer ([place]) 12/26/2011  ? Stage 1, grade 1, S/P hysterectomy initially by robotic technique and then salping[facility]oophorectomy at [facility] receiving no postoperative treatment.  ? ?Past Surgical History:  ?Procedure Laterality Date  ? ABDOMINAL HYSTERECTOMY    ? endometrial cancer  ? CHOLECYSTECTOMY    ? COLONOSCOPY WITH PROPOFOL N/A 01/19/2014  ? [id number]  ? DILATION AND CURETTAGE OF UTERUS  12 yrs ago  ? DILATION AND CURETTAGE OF UTERUS  06/18/2011  ? Procedure: DILATATION AND CURETTAGE;  Surgeon: Florian Buff, MD;  Location: [facility];  Service: Gynecology;  Laterality: N/A;  Suction Dilation and Curettage  ? ESOPHAGOGASTRODUODENOSCOPY N/A 10/12/2013  ? Dr. Rourk:anastomotic ulcer likely cause of bleeding. likely ischemic   ? ESOPHAGOGASTRODUODENOSCOPY (EGD) WITH PROPOFOL N/A 01/19/2014  ? [id number]  ? EXTRACORPOREAL SHOCK WAVE LITHOTRIPSY Left 01/11/2018  ? Procedure: LEFT EXTRACORPOREAL SHOCK WAVE LITHOTRIPSY (ESWL);  Surgeon: Lucas Mallow, MD;  [facility]: [facility];  Service: Urology;  Laterality: Left;  ? EXTRACORPOREAL SHOCK WAVE LITHOTRIPSY Left 01/14/2018  ?  Procedure: LEFT EXTRACORPOREAL SHOCK WAVE LITHOTRIPSY (ESWL);  Surgeon: Cleon Gustin, MD;  Location: [facility];  Service: Urology;  Laterality: Left;  75 MINS ? W/ MAC  ? GASTRIC BYPASS  2013  ? [facility]  ? HYSTEROSCOPY WITH D & C  06/18/2011  ? Procedure: DILATATION AND CURETTAGE /HYSTEROSCOPY;  Surgeon: Florian Buff, MD;  Location: [facility];  Service: Gynecology;  Laterality: N/A;  ? LITHOTRIPSY    ? paniculectomy    ? SHOULDER ARTHROSCOPY WITH SUBACROMIAL DECOMPRESSION AND OPEN ROTATOR C Left 07/16/2021  ? Procedure: LEFT ARTHROSCOPIC SUBACROMIAL DECOMPRESSION, MINI OPEN ROTATOR CUFF TEAR REPAIR, BICEPS TENOTOMY;  Surgeon: [name], MD;  Location[facility];  Service: Orthopedics;  Laterality: Left;  ? TRIGGER FINGER  RELEASE    ? WISDOM TOOTH EXTRACTION    ? ?Patient Active Problem List  ? Diagnosis Date Noted  ? Complete tear of left rotator cuff 07/23/2021  ? Bee sting allergy 07/07/2016  ? Attention deficit disorder (ADD) in adult 07/07/2016  ? Chronic insomnia 07/07/2016  ? Vitamin D deficiency 07/07/2016  ? Migraine syndrome 07/07/2016  ? Osteoarthritis of both knees 07/07/2016  ? Fibromyalgia syndrome 07/07/2016  ? 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 HLD (hyperlipidemia) 07/07/2016  ? Morbid obesity due to excess calories ([place]) 03/10/2015  ? Diabetes mellitus without complication ([place]) 75/88/3254  ? Nontoxic multinodular goiter 03/09/2015  ? Essential hypertension, benign 03/09/2015  ? 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 IDA (iron deficiency anemia) 12/27/2013  ? Ureteral calculus, left 11/17/2013  ? SOB (shortness of breath) 11/30/2012  ? ? ?ONSET DATE: 07/16/21 ? ?REFERRING DIAG: s/p left shoulder arthroplasty ? ?THERAPY DIAG:  ?Other symptoms and signs involving the musculoskeletal system ? ?Stiffness of left shoulder, not elsewhere classified ? ?Acute pain of left shoulder ? ? ?PERTINENT HISTORY: Patient underwent a left shoulder arthroscopy on 07/16/21. Returns for follow up on 08/20/21 to Dr. Durward Fortes.  ? ?PRECAUTIONS: Shoulder; Progress as tolerated.  ? ?SUBJECTIVE: S: I haven't been able to do anything because my arm is hurting so bad.  ? ?PAIN:  ?Are you having pain? 9/10  ?Pain location: left shoulder to upper arm ?Pain description: sore, tight, constant ?Aggravating factors: everything ?Relieving factors: ice  ? ? ? ? ?OBJECTIVE:  ? ? UE ROM    ?  ?  ?Passive ROM ?Assessed supine. IR/er adducted Left ?08/01/2021 08/12/21  ?Shoulder flexion 93 138  ?Shoulder abduction 82 160  ?Shoulder internal rotation 80 90  ?Shoulder external rotation 35 80  ?(Blank rows = not tested) ?  ?A/ROM not assessed due to precautions ?Active ROM Left ?08/01/2021  ?Shoulder flexion    ?Shoulder abduction    ?Shoulder internal rotation     ?Shoulder external rotation    ?(Blank rows = not tested) ?  ?  ?  ?UE MMT:    ?MMT not assessed due to precautions ?MMT Left ?08/01/2021  ?Shoulder flexion    ?Shoulder abduction    ?Shoulder internal rotation    ?Shoulder external rotation    ?(Blank rows = not tested) ? ? ? ?TODAY'S TREATMENT: ?09/06/21 ?-ES: left shoulder, interferential, 10.0CV, 15'  ?-Moist heat: left shoulder, 15' ? ?09/03/2021 ?-P/ROM: supine, left shoulder all ranges, 5X each ?-AA/ROM: supine, left shoulder, protraction, IR/er, horiziontal abduction/adduction, flexion 10X each ?-AA/ROM: seated, left shoulder, protraction, IR/er, flexion 10X each ?-Scapular A/ROM: seated, row, extension, 10X each ?-Wall wash: 1' flexion ?-Pulleys: 1' flexion, 1' abduction ? ? ?08/21/21 ?-Myofascial release: completed separately from therapeutic exercises.  Myofascial release to left upper arm, anterior shoulder, and trapezius regions to decrease pain and fascial restrictions, increase joint ROM. ?-P/ROM: supine, left shoulder all ranges, 5X each ?- AA/ROM: supine, left shoulder, protraction, IR/er, horiziontal abduction/adduction, flexion 10X each ?- AA/ROM: standing, left shoulder, abduction, 10X ?- Pulleys: 1' flexion ? ? ? ?PATIENT EDUCATION: ?Education details: educated on pain management strategies to try at home-TENS, rice heating pad, relaxation techniques.  ?Person educated: Patient ?Education method: Explanation, Demonstration, Tactile cues, Verbal cues, and Handouts ?Education comprehension: verbalized understanding and returned demonstration  ? ? ?SHORT TERM GOALS: Target date: 09/12/2021 ?  ?Pt will increase her LUE P/ROM to [facility] in order to complete grooming and dressing tasks with less difficulty. ?  ?Goal status: Ongoing ?  ?2.  Pt will increase her LUE strength to 3+/5 in order to complete waist level reaching activities with less difficulty.  ?  ?Goal status: Ongoing ?  ?3.  Pt will report a pain level of approximately 5/10 or less when using her  LUE for tasks such as grooming and dressing.  ?  ?Goal status:Ongoing ?  ?4.  Pt will decrease her left UE fascial restrictions to moderate amount in order to increase the functional mobility needed to complete bathing and d

## 2021-09-10 ENCOUNTER — Encounter (HOSPITAL_COMMUNITY): Payer: 59 | Admitting: Occupational Therapy

## 2021-09-11 ENCOUNTER — Telehealth (HOSPITAL_COMMUNITY): Payer: Self-pay

## 2021-09-11 ENCOUNTER — Ambulatory Visit (HOSPITAL_COMMUNITY): Payer: 59

## 2021-09-11 NOTE — Telephone Encounter (Signed)
She has another MD apptmnt and can not be here since her time was changed- please cx ?

## 2021-09-12 ENCOUNTER — Encounter (HOSPITAL_COMMUNITY): Payer: Self-pay

## 2021-09-12 ENCOUNTER — Ambulatory Visit (HOSPITAL_COMMUNITY): Payer: 59

## 2021-09-12 DIAGNOSIS — M25512 Pain in left shoulder: Secondary | ICD-10-CM

## 2021-09-12 DIAGNOSIS — M25612 Stiffness of left shoulder, not elsewhere classified: Secondary | ICD-10-CM

## 2021-09-12 DIAGNOSIS — R29898 Other symptoms and signs involving the musculoskeletal system: Secondary | ICD-10-CM | POA: Diagnosis not present

## 2021-09-12 NOTE — Therapy (Signed)
?OUTPATIENT OCCUPATIONAL THERAPY TREATMENT NOTE ?Mini reassessment ? ? ?Patient Name: Molly Berry ?MRN: [id number] ?DOB:1966/02/15, 56 y.o., female ?Today's Date: 09/12/2021 ? ?PCP: Nicholes Rough, PA-C ?REFERRING PROVIDER: Dr. Joni Fears ? ?END OF SESSION:  ? OT End of Session - 09/12/21 1052   ? ? Visit Number 8   ? Number of Visits 24   ? Date for OT Re-Evaluation 10/24/21   ? Authorization Type NEW: [employer]   ? Authorization Time Period 09/02/21 - current. Copay $20. No authorized . Visit limit: 35. 0 used.   ? Authorization - Visit Number 3   ? Authorization - Number of Visits 35   ? OT Start Time 1030   mini reassessment  ? OT Stop Time 1108   ? OT Time Calculation (min) 38 min   ? Activity Tolerance Patient tolerated treatment well   ? Behavior During Therapy [facility] for tasks assessed/performed   ? ?  ?  ? ?  ? ? ? ? ? ? ? ?Past Medical History:  ?Diagnosis Date  ? Allergy   ? Anemia   ? Anxiety   ? Arthritis   ? knees, multiple joints  ? Asthma   ? Bell palsy   ? Blood transfusion without reported diagnosis   ? Breast mass   ? lt breast mass x's 3 years increased in size  ? Chronic kidney disease   ? Chronic pain syndrome   ? COPD (chronic obstructive pulmonary disease) ([place])   ? Depression   ? Diabetes mellitus   ? Endometrial cancer ([place])   ? Fibromyalgia   ? GERD (gastroesophageal reflux disease)   ? 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 At [facility]  ? Headache   ? mirgraines  ? Hyperlipidemia   ? Hypertension   ? Hypothyroidism   ? Multiple pulmonary nodules 11/30/2012  ? Followed in Pulmonary clinic/ [facility]/ [place]  - See CT abd  10/29/12  New right lower lobe pulmonary nodularity, primarily ground-  glass in density. This could reflect an inflammatory process,  although follow-up is necessary to exclude atypical neoplasm. Full  chest CT should be considered to evaluate for other pulmonary  findings.     ? Multiple thyroid nodules   ? Nephropathy   ? Pneumonia   ? PONV (postoperative nausea and  vomiting)   ? Pulmonary nodules   ? Thyroid disease   ? Ulcer   ? Uterine cancer ([place]) 12/26/2011  ? Stage 1, grade 1, S/P hysterectomy initially by robotic technique and then [facility]oophorectomy at [facility] receiving no postoperative treatment.  ? ?Past Surgical History:  ?Procedure Laterality Date  ? ABDOMINAL HYSTERECTOMY    ? endometrial cancer  ? CHOLECYSTECTOMY    ? COLONOSCOPY WITH PROPOFOL N/A 01/19/2014  ? [id number]  ? DILATION AND CURETTAGE OF UTERUS  12 yrs ago  ? DILATION AND CURETTAGE OF UTERUS  06/18/2011  ? Procedure: DILATATION AND CURETTAGE;  Surgeon: Florian Buff, MD;  Location: [facility];  Service: Gynecology;  Laterality: N/A;  Suction Dilation and Curettage  ? ESOPHAGOGASTRODUODENOSCOPY N/A 10/12/2013  ? Dr. Rourk:anastomotic ulcer likely cause of bleeding. likely ischemic   ? ESOPHAGOGASTRODUODENOSCOPY (EGD) WITH PROPOFOL N/A 01/19/2014  ? [id number]  ? EXTRACORPOREAL SHOCK WAVE LITHOTRIPSY Left 01/11/2018  ? Procedure: LEFT EXTRACORPOREAL SHOCK WAVE LITHOTRIPSY (ESWL);  Surgeon: Lucas Mallow, MD;  Location[facility];  Service: Urology;  Laterality: Left;  ? EXTRACORPOREAL SHOCK WAVE LITHOTRIPSY Left 01/14/2018  ? Procedure: LEFT EXTRACORPOREAL SHOCK WAVE  LITHOTRIPSY (ESWL);  Surgeon: Cleon Gustin, MD;  Location: [facility];  Service: Urology;  Laterality: Left;  75 MINS ? W/ MAC  ? GASTRIC BYPASS  2013  ? [facility]  ? HYSTEROSCOPY WITH D & C  06/18/2011  ? Procedure: DILATATION AND CURETTAGE /HYSTEROSCOPY;  Surgeon: Florian Buff, MD;  Location: [facility];  Service: Gynecology;  Laterality: N/A;  ? LITHOTRIPSY    ? paniculectomy    ? SHOULDER ARTHROSCOPY WITH SUBACROMIAL DECOMPRESSION AND OPEN ROTATOR C Left 07/16/2021  ? Procedure: LEFT ARTHROSCOPIC SUBACROMIAL DECOMPRESSION, MINI OPEN ROTATOR CUFF TEAR REPAIR, BICEPS TENOTOMY;  Surgeon: [name], MD;  Location[facility];  Service: Orthopedics;  Laterality: Left;  ? TRIGGER FINGER RELEASE    ? WISDOM TOOTH EXTRACTION    ? ?Patient  Active Problem List  ? Diagnosis Date Noted  ? Complete tear of left rotator cuff 07/23/2021  ? Bee sting allergy 07/07/2016  ? Attention deficit disorder (ADD) in adult 07/07/2016  ? Chronic insomnia 07/07/2016  ? Vitamin D deficiency 07/07/2016  ? Migraine syndrome 07/07/2016  ? Osteoarthritis of both knees 07/07/2016  ? Fibromyalgia syndrome 07/07/2016  ? 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 HLD (hyperlipidemia) 07/07/2016  ? Morbid obesity due to excess calories ([place]) 03/10/2015  ? Diabetes mellitus without complication ([place]) 31/51/7616  ? Nontoxic multinodular goiter 03/09/2015  ? Essential hypertension, benign 03/09/2015  ? 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 IDA (iron deficiency anemia) 12/27/2013  ? Ureteral calculus, left 11/17/2013  ? SOB (shortness of breath) 11/30/2012  ? ? ?ONSET DATE: 07/16/21 ? ?REFERRING DIAG: s/p left shoulder arthroplasty ? ?THERAPY DIAG:  ?Other symptoms and signs involving the musculoskeletal system ? ?Acute pain of left shoulder ? ?Stiffness of left shoulder, not elsewhere classified ? ? ?PERTINENT HISTORY: Patient underwent a left shoulder arthroscopy on 07/16/21. Returns for follow up on 09/18/21 to Dr. Durward Fortes.  ? ?PRECAUTIONS: Shoulder; Progress as tolerated.  ? ?SUBJECTIVE: S: What we did last time really helped. (TENS unit and heat) ? ?PAIN:  ?Are you having pain? 3/10  ?Pain location: left shoulder to upper arm ?Pain description: sore, tight, constant ?Aggravating factors: everything ?Relieving factors: ice  ? ? ? ? ?OBJECTIVE:  ? ? UE ROM    ?  ?  ?Passive ROM ?Assessed supine. IR/er adducted Left ?08/01/2021 08/12/21 09/12/21  ?Shoulder flexion 93 138 180  ?Shoulder abduction 82 160 180  ?Shoulder internal rotation 80 90 90  ?Shoulder external rotation 35 80 80  ?(Blank rows = not tested) ?  ?A/ROM: IR/er adducted, Standing ?Active ROM Left ?08/01/2021 Left ?09/12/21  ?Shoulder flexion   90  ?Shoulder abduction   70  ?Shoulder internal rotation   90  ?Shoulder external rotation   58   ?(Blank rows = not tested) ?  ?  ?  ?UE MMT:    ?MMT: IR/er adducted. standing ?MMT Left ?08/01/2021 Left ?09/12/21  ?Shoulder flexion   3-/5  ?Shoulder abduction   3-/5  ?Shoulder internal rotation   5/5  ?Shoulder external rotation   3/5  ?(Blank rows = not tested) ? ?UEFI: 53 (09/12/21) ? ?TODAY'S TREATMENT: ?09/12/21 ?- P/ROM: supine, left shoulder all ranges, 5X each ?- AA/ROM:  seated, shoulder, protraction, flexion (shoulder level),  horizontal abduction/adduction, IR/er: 10X ?Standing: abduction 10X ?- Wall wash: 1' ?- Pulleys: 1' flexion, 1' abduction ?  ? ? ? ?09/06/21 ?-ES: left shoulder, interferential, 10.0CV, 15'  ?-Moist heat: left shoulder, 15' ? ?09/03/2021 ?-P/ROM: supine, left shoulder all ranges,  5X each ?-AA/ROM: supine, left shoulder, protraction, IR/er, horiziontal abduction/adduction, flexion 10X each ?-AA/ROM: seated, left shoulder, protraction, IR/er, flexion 10X each ?-Scapular A/ROM: seated, row, extension, 10X each ?-Wall wash: 1' flexion ?-Pulleys: 1' flexion, 1' abduction ? ? ?08/21/21 ?-Myofascial release: completed separately from therapeutic exercises. Myofascial release to left upper arm, anterior shoulder, and trapezius regions to decrease pain and fascial restrictions, increase joint ROM. ?-P/ROM: supine, left shoulder all ranges, 5X each ?- AA/ROM: supine, left shoulder, protraction, IR/er, horiziontal abduction/adduction, flexion 10X each ?- AA/ROM: standing, left shoulder, abduction, 10X ?- Pulleys: 1' flexion ? ? ? ?PATIENT EDUCATION: ?Education details:  ?Person educated:  ?IT sales professional:  ?Education comprehension:  ? ? ?SHORT TERM GOALS: Target date: 09/12/2021 ?  ?Pt will increase her LUE P/ROM to [facility] in order to complete grooming and dressing tasks with less difficulty. ?  ?Goal status: MET ?  ?2.  Pt will increase her LUE strength to 3+/5 in order to complete waist level reaching activities with less difficulty.  ?  ?Goal status: Partially Met ?  ?3.  Pt will report a pain  level of approximately 5/10 or less when using her LUE for tasks such as grooming and dressing.  ?  ?Goal status: MET ?  ?4.  Pt will decrease her left UE fascial restrictions to moderate amount in or

## 2021-09-17 ENCOUNTER — Ambulatory Visit (HOSPITAL_COMMUNITY): Payer: 59

## 2021-09-18 ENCOUNTER — Ambulatory Visit (INDEPENDENT_AMBULATORY_CARE_PROVIDER_SITE_OTHER): Payer: 59 | Admitting: Orthopaedic Surgery

## 2021-09-18 ENCOUNTER — Encounter: Payer: Self-pay | Admitting: Orthopaedic Surgery

## 2021-09-18 DIAGNOSIS — M75122 Complete rotator cuff tear or rupture of left shoulder, not specified as traumatic: Secondary | ICD-10-CM

## 2021-09-18 NOTE — Progress Notes (Signed)
? ?Office Visit Note ?  ?Patient: Molly Berry           ?Date of Birth: 1966/01/03           ?MRN: [id number] ?Visit Date: 09/18/2021 ?             ?Requested by: Nicholes Rough, PA-C ?[address] ?[place],  [place] [address] ?PCP: Nicholes Rough, PA-C ? ? ?Assessment & Plan: ?Visit Diagnoses: Left shoulder pain ? ?Plan: Patient is now 2 months status post left shoulder arthroscopy mini open rotator cuff repair she has been doing physical therapy.  She is wondering if she can transition to a home exercise program.  She feels better than prior to surgery.  She understands that her rotator cuff muscles are still weak and she continues to need to work on strengthening and should avoid any at risk motions with her arm.  She may follow-up as needed she will obtain a home exercise program from PT tomorrow ? ?Follow-Up Instructions: No follow-ups on file.  ? ?Orders:  ?No orders of the defined types were placed in this encounter. ? ?No orders of the defined types were placed in this encounter. ? ? ? ? Procedures: ?No procedures performed ? ? ?Clinical Data: ?No additional findings. ? ? ?Subjective: ?No chief complaint on file. ?Patient presents today for follow up on her left shoulder. She had a left shoulder arthroscopy on 07/16/2021. She is going to physical therapy twice weekly. She said that she is doing great. No complaints. She takes oxycodone for other body ailments.  ? ? ? ? ?Review of Systems  ?All other systems reviewed and are negative. ? ? ?Objective: ?Vital Signs: LMP 05/06/2011  ? ?Physical Exam ?Patient is alert and oriented pleasant to exam ?Ortho Exam ?Left shoulder well-healed surgical incisions.  She has no redness no swelling no induration.  She has forward elevation almost equivalent to her unaffected side.  Her strength is improving it is slightly less than the unaffected side but she is able to maintain resisted abduction internal rotation behind her back is quite good.  Distal circulation sensory is  intact y Comments:  ?No specialty comments available. ? ?Imaging: ?No results found. ? ? ?PMFS History: ?Patient Active Problem List  ? Diagnosis Date Noted  ? Complete tear of left rotator cuff 07/23/2021  ? Bee sting allergy 07/07/2016  ? Attention deficit disorder (ADD) in adult 07/07/2016  ? Chronic insomnia 07/07/2016  ? Vitamin D deficiency 07/07/2016  ? Migraine syndrome 07/07/2016  ? Osteoarthritis of both knees 07/07/2016  ? Fibromyalgia syndrome 07/07/2016  ? 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 HLD (hyperlipidemia) 07/07/2016  ? Morbid obesity due to excess calories ([place]) 03/10/2015  ? Diabetes mellitus without complication ([place]) 65/78/4696  ? Nontoxic multinodular goiter 03/09/2015  ? Essential hypertension, benign 03/09/2015  ? 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 IDA (iron deficiency anemia) 12/27/2013  ? Ureteral calculus, left 11/17/2013  ? SOB (shortness of breath) 11/30/2012  ? ?Past Medical History:  ?Diagnosis Date  ? Allergy   ? Anemia   ? Anxiety   ? Arthritis   ? knees, multiple joints  ? Asthma   ? Bell palsy   ? Blood transfusion without reported diagnosis   ? Breast mass   ? lt breast mass x's 3 years increased in size  ? Chronic kidney disease   ? Chronic pain syndrome   ? COPD (chronic obstructive pulmonary disease) ([place])   ? Depression   ? Diabetes  mellitus   ? Endometrial cancer ([place])   ? Fibromyalgia   ? GERD (gastroesophageal reflux disease)   ? 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 At [facility]  ? Headache   ? mirgraines  ? Hyperlipidemia   ? Hypertension   ? Hypothyroidism   ? Multiple pulmonary nodules 11/30/2012  ? Followed in [facility]/ [place]  - See CT abd  10/29/12  New right lower lobe pulmonary nodularity, primarily ground-  glass in density. This could reflect an inflammatory process,  although follow-up is necessary to exclude atypical neoplasm. Full  chest CT should be considered to evaluate for other pulmonary  findings.     ? Multiple thyroid nodules   ?  Nephropathy   ? Pneumonia   ? PONV (postoperative nausea and vomiting)   ? Pulmonary nodules   ? Thyroid disease   ? Ulcer   ? Uterine cancer ([place]) 12/26/2011  ? Stage 1, grade 1, S/P hysterectomy initially by robotic technique and then salpingo-oophorectomy at [facility] receiving no postoperative treatment.  ?  ?Family History  ?Problem Relation Age of Onset  ? Pneumonia Mother   ?     Deceased  ? Arthritis Mother   ? Asthma Mother   ? Cancer Mother   ?     pancreatic  ? COPD Mother   ? Depression Mother   ? Diabetes Mother   ? Kidney disease Mother   ? Liver cancer Father   ?     Living  ? Arthritis Father   ? Cancer Father   ?     liver, prostate  ? COPD Father   ? Depression Father   ? Stroke Father   ? Heart disease Maternal Grandmother   ? Mental illness Maternal Grandmother   ? Breast cancer Maternal Grandmother   ? Alcohol abuse Paternal Grandfather   ? Breast cancer Paternal Aunt   ? Anesthesia problems Neg Hx   ? Hypotension Neg Hx   ? Malignant hyperthermia Neg Hx   ? Pseudochol deficiency Neg Hx   ? Colon cancer Neg Hx   ?  ?Past Surgical History:  ?Procedure Laterality Date  ? ABDOMINAL HYSTERECTOMY    ? endometrial cancer  ? CHOLECYSTECTOMY    ? COLONOSCOPY WITH PROPOFOL N/A 01/19/2014  ? [id number]  ? DILATION AND CURETTAGE OF UTERUS  12 yrs ago  ? DILATION AND CURETTAGE OF UTERUS  06/18/2011  ? Procedure: DILATATION AND CURETTAGE;  Surgeon: Florian Buff, MD;  Location: [facility];  Service: Gynecology;  Laterality: N/A;  Suction Dilation and Curettage  ? ESOPHAGOGASTRODUODENOSCOPY N/A 10/12/2013  ? Dr. Rourk:anastomotic ulcer likely cause of bleeding. likely ischemic   ? ESOPHAGOGASTRODUODENOSCOPY (EGD) WITH PROPOFOL N/A 01/19/2014  ? [id number]  ? EXTRACORPOREAL SHOCK WAVE LITHOTRIPSY Left 01/11/2018  ? Procedure: LEFT EXTRACORPOREAL SHOCK WAVE LITHOTRIPSY (ESWL);  Surgeon: Lucas Mallow, MD;  Location: [facility];  Service: Urology;  Laterality: Left;  ? EXTRACORPOREAL SHOCK WAVE LITHOTRIPSY Left  01/14/2018  ? Procedure: LEFT EXTRACORPOREAL SHOCK WAVE LITHOTRIPSY (ESWL);  Surgeon: Cleon Gustin, MD;  Location: [facility];  Service: Urology;  Laterality: Left;  75 MINS ? W/ MAC  ? GASTRIC BYPASS  2013  ? [facility]  ? HYSTEROSCOPY WITH D & C  06/18/2011  ? Procedure: DILATATION AND CURETTAGE /HYSTEROSCOPY;  Surgeon: Florian Buff, MD;  Location: [facility];  Service: Gynecology;  Laterality: N/A;  ? LITHOTRIPSY    ? paniculectomy    ? SHOULDER ARTHROSCOPY WITH  SUBACROMIAL DECOMPRESSION AND OPEN ROTATOR C Left 07/16/2021  ? Procedure: LEFT ARTHROSCOPIC SUBACROMIAL DECOMPRESSION, MINI OPEN ROTATOR CUFF TEAR REPAIR, BICEPS TENOTOMY;  Surgeon: [name], MD;  Location: [facility];  Service: Orthopedics;  Laterality: Left;  ? TRIGGER FINGER RELEASE    ? WISDOM TOOTH EXTRACTION    ? ?Social History  ? ?Occupational History  ? Occupation: Event organiser employed)  ?  Employer: NOT EMPLOYED  ?Tobacco Use  ? Smoking status: Never  ? Smokeless tobacco: Never  ?Vaping Use  ? Vaping Use: Never used  ?Substance and Sexual Activity  ? Alcohol use: No  ?  Alcohol/week: 0.0 standard drinks  ? Drug use: No  ? Sexual activity: Yes  ?  Birth control/protection: None, Surgical  ? ? ? ? ? ? ?

## 2021-09-19 ENCOUNTER — Ambulatory Visit (HOSPITAL_COMMUNITY): Payer: 59 | Admitting: Occupational Therapy

## 2021-09-24 ENCOUNTER — Ambulatory Visit (HOSPITAL_COMMUNITY): Payer: 59 | Admitting: Occupational Therapy

## 2021-09-25 ENCOUNTER — Encounter (HOSPITAL_COMMUNITY): Payer: Self-pay | Admitting: Occupational Therapy

## 2021-09-25 NOTE — Therapy (Signed)
[facility] [address] [place], [place], [address] Phone: [phone]   Fax:  [phone]  Patient Details  Name: Molly Berry MRN: [id number] Date of Birth: 01/01/1966 Referring Provider:  No ref. provider found  Encounter Date: 09/25/2021   OCCUPATIONAL THERAPY DISCHARGE SUMMARY  Visits from Start of Care: 8  Current functional level related to goals / functional outcomes: Unknown. Pt did not return to therapy after MD appt on 09/18/21. Called to cancel appts stating MD said she can transition to HEP.    Remaining deficits: Unknown   Education / Equipment: HEP   Patient agrees to discharge. Patient goals were not met. Patient is being discharged due to the patient's request..      , OTR/L  [phone] 09/25/2021, 8:50 AM   [facility] [address] [place], [place], [address] Phone: [phone]   Fax:  [phone] 

## 2021-09-26 ENCOUNTER — Ambulatory Visit (HOSPITAL_COMMUNITY): Payer: 59

## 2021-09-26 ENCOUNTER — Ambulatory Visit: Payer: Self-pay

## 2021-10-01 ENCOUNTER — Encounter (HOSPITAL_COMMUNITY): Payer: 59 | Admitting: Occupational Therapy

## 2021-10-03 ENCOUNTER — Encounter (HOSPITAL_COMMUNITY): Payer: 59 | Admitting: Occupational Therapy

## 2021-10-08 ENCOUNTER — Encounter (HOSPITAL_COMMUNITY): Payer: 59 | Admitting: Occupational Therapy

## 2021-10-10 ENCOUNTER — Encounter (HOSPITAL_COMMUNITY): Payer: 59 | Admitting: Occupational Therapy

## 2021-10-15 ENCOUNTER — Encounter (HOSPITAL_COMMUNITY): Payer: 59 | Admitting: Occupational Therapy

## 2021-10-17 ENCOUNTER — Encounter (HOSPITAL_COMMUNITY): Payer: 59 | Admitting: Occupational Therapy

## 2021-10-21 ENCOUNTER — Ambulatory Visit (INDEPENDENT_AMBULATORY_CARE_PROVIDER_SITE_OTHER): Payer: 59

## 2021-10-21 ENCOUNTER — Ambulatory Visit
Admission: RE | Admit: 2021-10-21 | Discharge: 2021-10-21 | Disposition: A | Discharge status: Home / Self Care | Payer: 59 | Source: Ambulatory Visit | Attending: Nurse Practitioner | Admitting: Nurse Practitioner

## 2021-10-21 VITALS — BP 105/71 | HR 54 | Temp 98.4°F | Resp 18

## 2021-10-21 DIAGNOSIS — M25531 Pain in right wrist: Secondary | ICD-10-CM

## 2021-10-21 DIAGNOSIS — S6991XA Unspecified injury of right wrist, hand and finger(s), initial encounter: Secondary | ICD-10-CM | POA: Diagnosis not present

## 2021-10-21 NOTE — Discharge Instructions (Addendum)
-   The x-ray today does not show any fractures or broken bones in your wrist - Please wear the compression wrap that we have provided for a few hours each day - You can apply ice on top of the compression wrap; 15 minutes on, 45 minutes off - If your symptoms do not improve over the next 1-2 weeks follow up with a Hand Specialist; contact information is below

## 2021-10-21 NOTE — ED Provider Notes (Signed)
[facility]    CSN: [id number] Arrival date & time: 10/21/21  1444      History   Chief Complaint Chief Complaint  Patient presents with   Wrist Pain    Entered by patient    HPI Molly Berry is a 56 y.o. female.   Patient presents with right wrist pain that has been ongoing for the past couple of weeks.  Reports initially, it was improving slowly, then last week, she got her wrist stuck in an elevator door.  The pain then returned and became more severe.  She denies any numbness or tingling in her fingertips, decreased range of motion.  She does feel your wrist is little bit swollen, however denies bruising or redness.  She denies fevers, nausea/vomiting.  No history of wrist surgeries.  Medical history significant for diabetes, fibromyalgia, hyperlipidemia, vitamin D deficiency, migraines, obesity.  She does take Percocet on a regular basis and reports this helps minimally.  She has tried ice and this does seem to help.    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 This could reflect an inflammatory process,  although follow-up is necessary to exclude atypical neoplasm. Full  chest CT should be considered to  evaluate for other pulmonary  findings.      Multiple thyroid nodules    Nephropathy    Pneumonia    PONV (postoperative nausea and vomiting)    Pulmonary nodules    Thyroid disease    Ulcer    Uterine cancer ([place]) 12/26/2011   Stage 1, grade 1, S/P hysterectomy initially by robotic technique and then salping[facility]oophorectomy at [facility] receiving no postoperative treatment.    Patient Active Problem List   Diagnosis Date Noted   Complete tear of left rotator cuff 07/23/2021   Bee sting allergy 07/07/2016   Attention deficit disorder (ADD) in adult 07/07/2016   Chronic insomnia 07/07/2016   Vitamin D deficiency 07/07/2016   Migraine syndrome 07/07/2016   Osteoarthritis of both knees 07/07/2016   Fibromyalgia syndrome 07/07/2016   S/P gastric bypass 07/07/2016   HLD (hyperlipidemia) 07/07/2016   Morbid obesity due to excess calories ([place]) 03/10/2015   Diabetes mellitus without complication ([place]) 75/17/0017   Nontoxic multinodular goiter 03/09/2015   Essential hypertension, benign 03/09/2015   H/O gastric bypass 05/09/2014   IDA (iron deficiency anemia) 12/27/2013   Ureteral calculus, left 11/17/2013   SOB (shortness of breath) 11/30/2012    Past Surgical History:  Procedure Laterality Date   ABDOMINAL HYSTERECTOMY     endometrial cancer  CHOLECYSTECTOMY     COLONOSCOPY WITH PROPOFOL N/A 01/19/2014   OIT:[id number]   DILATION AND CURETTAGE OF UTERUS  12 yrs ago   DILATION AND CURETTAGE OF UTERUS  06/18/2011   Procedure: DILATATION AND CURETTAGE;  Surgeon: Florian Buff, MD;  Location: [facility];  Service: Gynecology;  Laterality: N/A;  Suction Dilation and Curettage   ESOPHAGOGASTRODUODENOSCOPY N/A 10/12/2013   Dr. Rourk:anastomotic ulcer likely cause of bleeding. likely ischemic    ESOPHAGOGASTRODUODENOSCOPY (EGD) WITH PROPOFOL N/A 01/19/2014   XEN:MMHWKG   EXTRACORPOREAL SHOCK WAVE LITHOTRIPSY Left 01/11/2018   Procedure: LEFT EXTRACORPOREAL SHOCK WAVE LITHOTRIPSY (ESWL);   Surgeon: Lucas Mallow, MD;  Location: [facility];  Service: Urology;  Laterality: Left;   EXTRACORPOREAL SHOCK WAVE LITHOTRIPSY Left 01/14/2018   Procedure: LEFT EXTRACORPOREAL SHOCK WAVE LITHOTRIPSY (ESWL);  Surgeon: Cleon Gustin, MD;  Location: [facility];  Service: Urology;  Laterality: Left;  7 MINS  W/ MAC   GASTRIC BYPASS  2013   [facility]   HYSTEROSCOPY WITH D & C  06/18/2011   Procedure: DILATATION AND CURETTAGE /HYSTEROSCOPY;  Surgeon: Florian Buff, MD;  Location: [facility];  Service: Gynecology;  Laterality: N/A;   LITHOTRIPSY     paniculectomy     SHOULDER ARTHROSCOPY WITH SUBACROMIAL DECOMPRESSION AND OPEN ROTATOR C Left 07/16/2021   Procedure: LEFT ARTHROSCOPIC SUBACROMIAL DECOMPRESSION, MINI OPEN ROTATOR CUFF TEAR REPAIR, BICEPS TENOTOMY;  Surgeon: [name], MD;  Location: [facility];  Service: Orthopedics;  Laterality: Left;   TRIGGER FINGER RELEASE     WISDOM TOOTH EXTRACTION      OB History   No obstetric history on file.      Home Medications    Prior to Admission medications   Medication Sig Start Date End Date Taking? Authorizing Provider  albuterol (PROVENTIL HFA;VENTOLIN HFA) 108 (90 BASE) MCG/ACT inhaler Inhale 2 puffs into the lungs every 6 (six) hours as needed for shortness of breath or wheezing.    [provider]  ALPRAZolam Duanne Moron) 1 MG tablet Take 1 mg by mouth 3 (three) times daily as needed for anxiety or sleep.    [provider]  amphetamine-dextroamphetamine (ADDERALL) 10 MG tablet Take 10-20 mg by mouth daily at 12 noon. 10/23/17   [provider]  amphetamine-dextroamphetamine (ADDERALL) 20 MG tablet Take 40 mg by mouth every morning.  07/14/16   [provider]  buPROPion (WELLBUTRIN SR) 150 MG 12 hr tablet Take 150 mg by mouth 2 (two) times daily. 10/03/17   [provider]  dicyclomine (BENTYL) 20 MG tablet Take 20 mg by mouth 3 (three) times daily as needed for spasms. 03/30/21   [provider]  diphenhydrAMINE (BENADRYL) 25 mg capsule Take 25 mg by mouth at bedtime.    [provider]  famotidine (PEPCID) 40 MG tablet Take 40 mg by mouth 2 (two) times daily.    [provider]  furosemide (LASIX) 20 MG tablet Take 20 mg by mouth daily. 02/15/14   [provider]  gabapentin (NEURONTIN) 600 MG tablet Take 600-1,800 mg by mouth See admin instructions. Take 1200 mg by mouth in the morning, 600 mg in the afternoon and 1200 mg at night    [provider]  lamoTRIgine (LAMICTAL) 200 MG tablet Take 200 mg by mouth 2 (two) times daily.    [provider]  methocarbamol (ROBAXIN) 750 MG tablet Take 750 mg by mouth 3 (three) times daily as needed for muscle spasms. 05/02/21  [provider]  metoprolol succinate (TOPROL-XL) 50 MG 24 hr tablet Take 50 mg by mouth daily. 12/10/16   [provider]  nitrofurantoin, macrocrystal-monohydrate, (MACROBID) 100 MG capsule Take 1 capsule (100 mg total) by mouth 2 (two) times daily. 08/06/21   Volney American, PA-C  ondansetron (ZOFRAN) 8 MG tablet Take 8 mg by mouth every 8 (eight) hours as needed for nausea or vomiting.  11/24/13   [provider]  oxyCODONE-acetaminophen (PERCOCET) 10-325 MG tablet Take 1 tablet by mouth every 4 (four) hours as needed for pain. 07/16/21   [name], MD  oxyCODONE-acetaminophen (PERCOCET/ROXICET) 5-325 MG tablet Take 1 tablet by mouth every 8 (eight) hours as needed for severe pain. 07/22/21   [name], MD  phenazopyridine (PYRIDIUM) 200 MG tablet Take 1 tablet (200 mg total) by mouth 3 (three) times daily as needed for pain. 08/06/21   Volney American, PA-C  simvastatin (ZOCOR) 20 MG tablet Take 20 mg by mouth daily.  05/29/16   [provider]  sitaGLIPtin (JANUVIA) 100 MG tablet Take 100 mg by mouth daily.    [provider]  SUMAtriptan (IMITREX) 100 MG tablet Take 100 mg by mouth every 2 (two)  hours as needed for migraine. 07/12/16   [provider]  vortioxetine HBr (TRINTELLIX) 20 MG TABS tablet Take 20 mg by mouth daily.    [provider]    Family History Family History  Problem Relation Age of Onset   Pneumonia Mother        Deceased   Arthritis Mother    Asthma Mother    Cancer Mother        pancreatic   COPD Mother    Depression Mother    Diabetes Mother    Kidney disease Mother    Liver cancer Father        Living   Arthritis Father    Cancer Father        liver, prostate   COPD Father    Depression Father    Stroke Father    Heart disease Maternal Grandmother    Mental illness Maternal Grandmother    Breast cancer Maternal Grandmother    Alcohol abuse Paternal Grandfather    Breast cancer Paternal Aunt    Anesthesia problems Neg Hx    Hypotension Neg Hx    Malignant hyperthermia Neg Hx    Pseudochol deficiency Neg Hx    Colon cancer Neg Hx     Social History Social History   Tobacco Use   Smoking status: Never   Smokeless tobacco: Never  Vaping Use   Vaping Use: Never used  Substance Use Topics   Alcohol use: No    Alcohol/week: 0.0 standard drinks of alcohol   Drug use: No     Allergies   Bee venom, Cephalexin, Vancomycin, Equetro [carbamazepine], Adhesive [tape], Cefotaxime, Ciprofloxacin, Clindamycin, Latex, Penicillins, and Sulfa antibiotics   Review of Systems Review of Systems Per HPI  Physical Exam Triage Vital Signs ED Triage Vitals  Enc Vitals Group     BP 10/21/21 1508 105/71     Pulse Rate 10/21/21 1508 91     Resp 10/21/21 1508 18     Temp 10/21/21 1508 98.4 F (36.9 C)     Temp src --      SpO2 10/21/21 1508 91 %     Weight --      Height --      Head Circumference --  Peak Flow --      Pain Score 10/21/21 1506 7     Pain Loc --      Pain Edu? --      Excl. in [place]? --    No data found.  Updated Vital Signs BP 105/71   Pulse (!) 54   Temp 98.4 F (36.9 C)   Resp 18   LMP  05/06/2011   SpO2 91% Comment: reports this is baseline  Visual Acuity Right Eye Distance:   Left Eye Distance:   Bilateral Distance:    Right Eye Near:   Left Eye Near:    Bilateral Near:     Physical Exam Vitals reviewed.  Constitutional:      General: She is not in acute distress.    Appearance: Normal appearance. She is obese. She is not toxic-appearing.  Pulmonary:     Effort: Pulmonary effort is normal. No respiratory distress.  Musculoskeletal:     Right wrist: Tenderness present. No snuff box tenderness. Normal pulse.     Left wrist: Normal. Normal pulse.     Comments: Inspection: No obvious deformity or redness to wrists bilaterally, there is mild swelling to right wrist.   Palpation: No obvious deformities palpated bilaterally.  Right wrist is tender to palpation near the radial nerve.   ROM: Full ROM to bilateral wrists Strength: 5/5 bilateral upper extremities Neurovascular: neurovascularly intact in left and right upper extremity   Neurological:     Mental Status: She is alert and oriented to person, place, and time.  Psychiatric:        Behavior: Behavior is cooperative.      UC Treatments / Results  Labs (all labs ordered are listed, but only abnormal results are displayed) Labs Reviewed - No data to display  EKG   Radiology DG Wrist Complete Right  Result Date: 10/21/2021 CLINICAL DATA:  Blunt trauma. EXAM: RIGHT WRIST - COMPLETE 3+ VIEW COMPARISON:  None Available. FINDINGS: No distal radius or ulnar fracture. Radiocarpal joint is intact. No carpal fracture. No soft tissue abnormality. IMPRESSION: No fracture or dislocation. Electronically Signed   By: Suzy Bouchard M.D.   On: 10/21/2021 15:22    Procedures Procedures (including critical care time)  Medications Ordered in UC Medications - No data to display  Initial Impression / Assessment and Plan / UC Course  I have reviewed the triage vital signs and the nursing notes.  Pertinent labs &  imaging results that were available during my care of the patient were reviewed by me and considered in my medical decision making (see chart for details).    Very pleasant, well-appearing 56 year old female with right wrist pain.  Wrist x-ray today does not show any fracture or dislocation.  She does not have tenderness over the snuffbox.  Treat with compression, ice, continued use of prescription pain medications.  Discussed if symptoms do not improve over the next 1 to 2 weeks, she can follow-up with a hand specialist and contact information given.  If she develops any new symptoms like numbness or tingling down to her fingers, she should seek care.  Final Clinical Impressions(s) / UC Diagnoses   Final diagnoses:  Right wrist pain     Discharge Instructions      - The x-ray today does not show any fractures or broken bones in your wrist - Please wear the compression wrap that we have provided for a few hours each day - You can apply ice on top of the compression  wrap; 15 minutes on, 45 minutes off - If your symptoms do not improve over the next 1-2 weeks follow up with a Hand Specialist; contact information is below    ED Prescriptions   None    I have reviewed the PDMP during this encounter.   Eulogio Bear, NP 10/21/21 1550

## 2021-10-21 NOTE — ED Triage Notes (Signed)
Pt presents with right wrist pain that has became worse after being closed in elevator

## 2021-10-22 ENCOUNTER — Encounter (HOSPITAL_COMMUNITY): Payer: 59 | Admitting: Occupational Therapy

## 2021-10-24 ENCOUNTER — Encounter (HOSPITAL_COMMUNITY): Payer: 59 | Admitting: Occupational Therapy

## 2021-11-07 ENCOUNTER — Ambulatory Visit: Payer: 59 | Admitting: Physician Assistant

## 2021-11-07 ENCOUNTER — Encounter: Payer: Self-pay | Admitting: Physician Assistant

## 2021-11-07 DIAGNOSIS — M25532 Pain in left wrist: Secondary | ICD-10-CM | POA: Diagnosis not present

## 2021-11-07 DIAGNOSIS — M25531 Pain in right wrist: Secondary | ICD-10-CM | POA: Insufficient documentation

## 2021-11-07 MED ORDER — LIDOCAINE HCL 1 % IJ SOLN
1.0000 mL | INTRAMUSCULAR | Status: AC | PRN
  Administered 2021-11-07: 1 mL

## 2021-11-07 MED ORDER — MELOXICAM 15 MG PO TABS: 15.0000 mg | ORAL_TABLET | Freq: Every day | ORAL | 0 refills | Status: DC

## 2021-11-07 MED ORDER — METHYLPREDNISOLONE ACETATE 40 MG/ML IJ SUSP
40.0000 mg | INTRAMUSCULAR | Status: AC | PRN
  Administered 2021-11-07: 40 mg via INTRA_ARTICULAR

## 2021-11-07 NOTE — Progress Notes (Signed)
Office Visit Note   Patient: Molly Berry           Date of Birth: 1965-09-02           MRN: [id number] Visit Date: 11/07/2021              Requested by: Nicholes Rough, PA-C [place],  [place] [id number] PCP: Nicholes Rough, PA-C  Right wrist pain    HPI: Patient is a pleasant 56 year old patient of Dr. Rudene Anda.  She presents today with a chief complaint of wrist pain.  She said she was having some pain in her wrist but then this was accentuated when she caught her wrist in a closing elevator door.  She did have x-rays taken which were negative for any fracture.  She points to the pain at the base of the wrist and the radial styloid.  Assessment & Plan: Visit Diagnoses:  De Quervain's tenosynovitis right wrist  Plan: Findings consistent with de Quervain's tenosynovitis.  I offered her an injection today and she would like to go forward with this.  I also gave her a wrist splint just for some support.  She will follow-up in 2 weeks with Dr. Durward Fortes at which time she also like to discuss arthritic problems she is having in her knees  Follow-Up Instructions:   Ortho Exam  Patient is alert, oriented, no adenopathy, well-dressed, normal affect, normal respiratory effort. Examination of her wrist she has a strong palpable pulse she is able to oppose her thumb to all her fingers.  She has significant reproduction of symptoms with clenching of her thumb and extending her arm  Imaging: No results found. No images are attached to the encounter.  Labs: Lab Results  Component Value Date   HGBA1C 6.6 (H) 07/12/2021   HGBA1C 6.8 (A) 03/06/2015   LABURIC 5.7 03/06/2015   REPTSTATUS 08/09/2021 FINAL 08/06/2021   CULT >=100,000 COLONIES/mL ESCHERICHIA COLI (A) 08/06/2021   LABORGA ESCHERICHIA COLI (A) 08/06/2021     Lab Results  Component Value Date   ALBUMIN 4.1 10/26/2017   ALBUMIN 3.7 07/14/2017   ALBUMIN 3.8 01/15/2015    Lab Results  Component Value Date    MG 2.4 03/31/2009   Lab Results  Component Value Date   VD25OH 23.2 (L) 10/31/2014   VD25OH 20.6 (L) 06/21/2014    No results found for: "PREALBUMIN"    Latest Ref Rng & Units 07/17/2021    5:41 PM 07/12/2021   11:17 AM 10/26/2017    4:03 PM  CBC EXTENDED  WBC 4.0 - 10.5 K/uL 7.5  6.3  9.4   RBC 3.87 - 5.11 MIL/uL 4.15  4.17  4.50   Hemoglobin 12.0 - 15.0 g/dL 11.4  11.2  13.2   HCT 36.0 - 46.0 % 37.2  36.5  42.6   Platelets 150 - 400 K/uL 320  311  379   NEUT# 1.7 - 7.7 K/uL   6.6   Lymph# 0.7 - 4.0 K/uL   2.0      There is no height or weight on file to calculate BMI.  Orders:  No orders of the defined types were placed in this encounter.  No orders of the defined types were placed in this encounter.    Procedures: Medium Joint Inj on 11/07/2021 11:44 AM Indications: pain and diagnostic evaluation Details: 25 G 1.5 in needle Medications: 1 mL lidocaine 1 %; 40 mg methylPREDNISolone acetate 40 MG/ML Outcome: tolerated well, no immediate complications  After  verbal consent was obtained area was prepped with alcohol and Betadine.  She was injected just proximal to the radial styloid she has significant relief from the lidocaine shortly afterwards Band-Aid was applied Procedure, treatment alternatives, risks and benefits explained, specific risks discussed. Consent was given by the patient.     Clinical Data: No additional findings.  ROS:  All other systems negative, except as noted in the HPI. Review of Systems  Objective: Vital Signs: LMP 05/06/2011   Specialty Comments:  No specialty comments available.  PMFS History: Patient Active Problem List   Diagnosis Date Noted  . Pain in right wrist 11/07/2021  . Pain in left wrist 11/07/2021  . Complete tear of left rotator cuff 07/23/2021  . Bee sting allergy 07/07/2016  . Attention deficit disorder (ADD) in adult 07/07/2016  . Chronic insomnia 07/07/2016  . Vitamin D deficiency 07/07/2016  . Migraine  syndrome 07/07/2016  . Osteoarthritis of both knees 07/07/2016  . Fibromyalgia syndrome 07/07/2016  . S/P gastric bypass 07/07/2016  . HLD (hyperlipidemia) 07/07/2016  . Morbid obesity due to excess calories ([place]) 03/10/2015  . Diabetes mellitus without complication ([place]) 16/02/9603  . Nontoxic multinodular goiter 03/09/2015  . Essential hypertension, benign 03/09/2015  . 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 IDA (iron deficiency anemia) 12/27/2013  . Ureteral calculus, left 11/17/2013  . SOB (shortness of breath) 11/30/2012   Past Medical History:  Diagnosis Date  . Allergy   . Anemia   . Anxiety   . Arthritis    knees, multiple joints  . Asthma   . Bell palsy   . Blood transfusion without reported diagnosis   . Breast mass    lt breast mass x's 3 years increased in size  . Chronic kidney disease   . Chronic pain syndrome   . COPD (chronic obstructive pulmonary disease) ([place])   . Depression   . Diabetes mellitus   . Endometrial cancer ([place])   . Fibromyalgia   . GERD (gastroesophageal reflux disease)   . 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 Headache    mirgraines  . Hyperlipidemia   . Hypertension   . Hypothyroidism   . Multiple pulmonary nodules 11/30/2012   Followed in Pulmonary clinic/ [facility]/ [place]  - See CT abd  10/29/12  New right lower lobe pulmonary nodularity, primarily ground-  glass in density. This could reflect an inflammatory process,  although follow-up is necessary to exclude atypical neoplasm. Full  chest CT should be considered to evaluate for other pulmonary  findings.     . Multiple thyroid nodules   . Nephropathy   . Pneumonia   . PONV (postoperative nausea and vomiting)   . Pulmonary nodules   . Thyroid disease   . Ulcer   . Uterine cancer ([place]) 12/26/2011   Stage 1, grade 1, S/P hysterectomy initially by robotic technique and then [facility]oophorectomy at [facility] receiving no postoperative treatment.    Family History  Problem  Relation Age of Onset  . Pneumonia Mother        Deceased  . Arthritis Mother   . Asthma Mother   . Cancer Mother        pancreatic  . COPD Mother   . Depression Mother   . Diabetes Mother   . Kidney disease Mother   . Liver cancer Father        Living  . Arthritis Father   . Cancer Father        liver,  prostate  . COPD Father   . Depression Father   . Stroke Father   . Heart disease Maternal Grandmother   . Mental illness Maternal Grandmother   . Breast cancer Maternal Grandmother   . Alcohol abuse Paternal Grandfather   . Breast cancer Paternal Aunt   . Anesthesia problems Neg Hx   . Hypotension Neg Hx   . Malignant hyperthermia Neg Hx   . Pseudochol deficiency Neg Hx   . Colon cancer Neg Hx     Past Surgical History:  Procedure Laterality Date  . ABDOMINAL HYSTERECTOMY     endometrial cancer  . CHOLECYSTECTOMY    . COLONOSCOPY WITH PROPOFOL N/A 01/19/2014   [id number]  . DILATION AND CURETTAGE OF UTERUS  12 yrs ago  . DILATION AND CURETTAGE OF UTERUS  06/18/2011   Procedure: DILATATION AND CURETTAGE;  Surgeon: Florian Buff, MD;  Location: [facility];  Service: Gynecology;  Laterality: N/A;  Suction Dilation and Curettage  . ESOPHAGOGASTRODUODENOSCOPY N/A 10/12/2013   Dr. Rourk:anastomotic ulcer likely cause of bleeding. likely ischemic   . ESOPHAGOGASTRODUODENOSCOPY (EGD) WITH PROPOFOL N/A 01/19/2014   [id number]:[id number]  . EXTRACORPOREAL SHOCK WAVE LITHOTRIPSY Left 01/11/2018   Procedure: LEFT EXTRACORPOREAL SHOCK WAVE LITHOTRIPSY (ESWL);  Surgeon: Lucas Mallow, MD;  Location: [facility];  Service: Urology;  Laterality: Left;  . EXTRACORPOREAL SHOCK WAVE LITHOTRIPSY Left 01/14/2018   Procedure: LEFT EXTRACORPOREAL SHOCK WAVE LITHOTRIPSY (ESWL);  Surgeon: Cleon Gustin, MD;  Location: [facility];  Service: Urology;  Laterality: Left;  75 MINS  W/ MAC  . GASTRIC BYPASS  2013   [facility]  . HYSTEROSCOPY WITH D & C  06/18/2011   Procedure: DILATATION AND CURETTAGE  /HYSTEROSCOPY;  Surgeon: Florian Buff, MD;  Location: [facility];  Service: Gynecology;  Laterality: N/A;  . LITHOTRIPSY    . paniculectomy    . SHOULDER ARTHROSCOPY WITH SUBACROMIAL DECOMPRESSION AND OPEN ROTATOR C Left 07/16/2021   Procedure: LEFT ARTHROSCOPIC SUBACROMIAL DECOMPRESSION, MINI OPEN ROTATOR CUFF TEAR REPAIR, BICEPS TENOTOMY;  Surgeon: [name], MD;  Location: [facility];  Service: Orthopedics;  Laterality: Left;  . TRIGGER FINGER RELEASE    . WISDOM TOOTH EXTRACTION     Social History   Occupational History  . Occupation: Event organiser employed)    Employer: NOT EMPLOYED  Tobacco Use  . Smoking status: Never  . Smokeless tobacco: Never  Vaping Use  . Vaping Use: Never used  Substance and Sexual Activity  . Alcohol use: No    Alcohol/week: 0.0 standard drinks of alcohol  . Drug use: No  . Sexual activity: Yes    Birth control/protection: None, Surgical

## 2021-11-07 NOTE — Addendum Note (Signed)
Addended by: Georgette Dover on: 11/07/2021 11:56 AM   Modules accepted: Orders

## 2021-11-28 ENCOUNTER — Ambulatory Visit: Payer: 59 | Admitting: Physician Assistant

## 2021-12-02 ENCOUNTER — Ambulatory Visit: Payer: Self-pay

## 2021-12-02 ENCOUNTER — Ambulatory Visit (INDEPENDENT_AMBULATORY_CARE_PROVIDER_SITE_OTHER): Payer: 59

## 2021-12-02 ENCOUNTER — Ambulatory Visit: Payer: 59 | Admitting: Physician Assistant

## 2021-12-02 DIAGNOSIS — M25561 Pain in right knee: Secondary | ICD-10-CM

## 2021-12-02 DIAGNOSIS — G8929 Other chronic pain: Secondary | ICD-10-CM | POA: Diagnosis not present

## 2021-12-02 DIAGNOSIS — M170 Bilateral primary osteoarthritis of knee: Secondary | ICD-10-CM

## 2021-12-02 DIAGNOSIS — M25562 Pain in left knee: Secondary | ICD-10-CM

## 2021-12-02 DIAGNOSIS — M1712 Unilateral primary osteoarthritis, left knee: Secondary | ICD-10-CM | POA: Insufficient documentation

## 2021-12-02 MED ORDER — METHYLPREDNISOLONE ACETATE 40 MG/ML IJ SUSP
80.0000 mg | INTRAMUSCULAR | Status: AC | PRN
  Administered 2021-12-02: 80 mg via INTRA_ARTICULAR

## 2021-12-02 MED ORDER — BUPIVACAINE HCL 0.25 % IJ SOLN
2.0000 mL | INTRAMUSCULAR | Status: AC | PRN
  Administered 2021-12-02: 2 mL via INTRA_ARTICULAR

## 2021-12-02 MED ORDER — LIDOCAINE HCL 1 % IJ SOLN
2.0000 mL | INTRAMUSCULAR | Status: AC | PRN
  Administered 2021-12-02: 2 mL

## 2021-12-02 NOTE — Progress Notes (Signed)
Office Visit Note   Patient: Molly Berry           Date of Birth: Sep 21, 1965           MRN: [id number] Visit Date: 12/02/2021              Requested by: Nicholes Rough, PA-C [place],  [place] [address] PCP: Nicholes Rough, PA-C  Chief Complaint  Patient presents with   Right Knee - Pain   Left Knee - Pain      HPI: Patient is a pleasant 56 year old woman who comes in today for evaluation of her knees.  She has a history of bilateral knee arthritis which she told has been bone-on-bone.  She is done very well in the past with viscosupplementation but unfortunately last year her insurance company would not cover this.  Her knees bother her every day especially when she first gets up in the morning.  She also has had cortisone injections though these do not last as long  Assessment & Plan: Visit Diagnoses:  1. Chronic pain of right knee   2. Chronic pain of left knee   3. Primary osteoarthritis of both knees     Plan: We will go forward with bilateral cortisone injections today.  We will also authorize for viscosupplementation which has been very helpful to her in the past. This patient is diagnosed with osteoarthritis of the knee(s).    Radiographs show evidence of joint space narrowing, osteophytes, subchondral sclerosis and/or subchondral cysts.  This patient has knee pain which interferes with functional and activities of daily living.    This patient has experienced inadequate response, adverse effects and/or intolerance with conservative treatments such as acetaminophen, NSAIDS, topical creams, physical therapy or regular exercise, knee bracing and/or weight loss.   This patient has experienced inadequate response or has a contraindication to intra articular steroid injections for at least 3 months.   This patient is not scheduled to have a total knee replacement within 6 months of starting treatment with viscosupplementation.   Follow-Up Instructions: No  follow-ups on file.   Ortho Exam  Patient is alert, oriented, no adenopathy, well-dressed, normal affect, normal respiratory effort. Examination of her bilateral knees she has no effusion she has no redness.  She has joint pain more medially than laterally and grinding with range of motion.  Compartments of the lower extremities are soft and nontender sensation is intact  Imaging: XR KNEE 3 VIEW LEFT  Result Date: 12/02/2021 Three-view radiographs of her left knee were obtained today.  She has tricompartmental arthritis with most findings in the medial compartment and the patellofemoral compartment.  No acute fractures.  She has sclerotic changes  XR KNEE 3 VIEW RIGHT  Result Date: 12/02/2021 Radiographs of her right knee in 3 projections were reviewed today.  She has tricompartmental arthritis with most changes in the medial compartment with near bone-on-bone.  Also patellofemoral sclerosis and osteophyte formation no acute fractures  No images are attached to the encounter.  Labs: Lab Results  Component Value Date   HGBA1C 6.6 (H) 07/12/2021   HGBA1C 6.8 (A) 03/06/2015   LABURIC 5.7 03/06/2015   REPTSTATUS 08/09/2021 FINAL 08/06/2021   CULT >=100,000 COLONIES/mL ESCHERICHIA COLI (A) 08/06/2021   LABORGA ESCHERICHIA COLI (A) 08/06/2021     Lab Results  Component Value Date   ALBUMIN 4.1 10/26/2017   ALBUMIN 3.7 07/14/2017   ALBUMIN 3.8 01/15/2015    Lab Results  Component Value Date  MG 2.4 03/31/2009   Lab Results  Component Value Date   VD25OH 23.2 (L) 10/31/2014   VD25OH 20.6 (L) 06/21/2014    No results found for: "PREALBUMIN"    Latest Ref Rng & Units 07/17/2021    5:41 PM 07/12/2021   11:17 AM 10/26/2017    4:03 PM  CBC EXTENDED  WBC 4.0 - 10.5 K/uL 7.5  6.3  9.4   RBC 3.87 - 5.11 MIL/uL 4.15  4.17  4.50   Hemoglobin 12.0 - 15.0 g/dL 11.4  11.2  13.2   HCT 36.0 - 46.0 % 37.2  36.5  42.6   Platelets 150 - 400 K/uL 320  311  379   NEUT# 1.7 - 7.7 K/uL    6.6   Lymph# 0.7 - 4.0 K/uL   2.0      There is no height or weight on file to calculate BMI.  Orders:  Orders Placed This Encounter  Procedures   XR KNEE 3 VIEW RIGHT   XR KNEE 3 VIEW LEFT   No orders of the defined types were placed in this encounter.    Procedures: Large Joint Inj: bilateral knee on 12/02/2021 11:35 AM Indications: pain and diagnostic evaluation Details: 25 G 1.5 in needle, anteromedial approach  Arthrogram: No  Medications (Right): 2 mL lidocaine 1 %; 2 mL bupivacaine 0.25 %; 80 mg methylPREDNISolone acetate 40 MG/ML Medications (Left): 2 mL lidocaine 1 %; 2 mL bupivacaine 0.25 %; 80 mg methylPREDNISolone acetate 40 MG/ML Outcome: tolerated well, no immediate complications Procedure, treatment alternatives, risks and benefits explained, specific risks discussed. Consent was given by the patient.    Clinical Data: No additional findings.  ROS:  All other systems negative, except as noted in the HPI. Review of Systems  Objective: Vital Signs: LMP 05/06/2011   Specialty Comments:  No specialty comments available.  PMFS History: Patient Active Problem List   Diagnosis Date Noted   Osteoarthritis of knees, bilateral 12/02/2021   Pain in right wrist 11/07/2021   Pain in left wrist 11/07/2021   Complete tear of left rotator cuff 07/23/2021   Bee sting allergy 07/07/2016   Attention deficit disorder (ADD) in adult 07/07/2016   Chronic insomnia 07/07/2016   Vitamin D deficiency 07/07/2016   Migraine syndrome 07/07/2016   Osteoarthritis of both knees 07/07/2016   Fibromyalgia syndrome 07/07/2016   S/P gastric bypass 07/07/2016   HLD (hyperlipidemia) 07/07/2016   Morbid obesity due to excess calories ([place]) 03/10/2015   Diabetes mellitus without complication ([place]) 75/17/0017   Nontoxic multinodular goiter 03/09/2015   Essential hypertension, benign 03/09/2015   H/O gastric bypass 05/09/2014   IDA (iron deficiency anemia) 12/27/2013   Ureteral  calculus, left 11/17/2013   SOB (shortness of breath) 11/30/2012   Past Medical History:  Diagnosis Date   Allergy    Anemia    Anxiety    Arthritis    knees, multiple joints   Asthma    Bell palsy    Blood transfusion without reported diagnosis    Breast mass    lt breast mass x's 3 years increased in size   Chronic kidney disease    Chronic pain syndrome    COPD (chronic obstructive pulmonary disease) ([place])    Depression    Diabetes mellitus    Endometrial cancer (HCC)    Fibromyalgia    GERD (gastroesophageal reflux disease)    H/O gastric bypass 05/09/2014   At [facility]   Headache    mirgraines   Hyperlipidemia  Hypertension    Hypothyroidism    Multiple pulmonary nodules 11/30/2012   Followed in Pulmonary clinic/ [facility]/ [place]  - See CT abd  10/29/12  New right lower lobe pulmonary nodularity, primarily ground-  glass in density. This could reflect an inflammatory process,  although follow-up is necessary to exclude atypical neoplasm. Full  chest CT should be considered to evaluate for other pulmonary  findings.      Multiple thyroid nodules    Nephropathy    Pneumonia    PONV (postoperative nausea and vomiting)    Pulmonary nodules    Thyroid disease    Ulcer    Uterine cancer ([place]) 12/26/2011   Stage 1, grade 1, S/P hysterectomy initially by robotic technique and then [facility]ooph[facility]omy at [facility] receiving no postoperative treatment.    Family History  Problem Relation Age of Onset   Pneumonia Mother        Deceased   Arthritis Mother    Asthma Mother    Cancer Mother        pancreatic   COPD Mother    Depression Mother    Diabetes Mother    Kidney disease Mother    Liver cancer Father        Living   Arthritis Father    Cancer Father        liver, prostate   COPD Father    Depression Father    Stroke Father    Heart disease Maternal Grandmother    Mental illness Maternal Grandmother    Breast cancer Maternal Grandmother    Alcohol  abuse Paternal Grandfather    Breast cancer Paternal Aunt    Anesthesia problems Neg Hx    Hypotension Neg Hx    Malignant hyperthermia Neg Hx    Pseudochol deficiency Neg Hx    Colon cancer Neg Hx     Past Surgical History:  Procedure Laterality Date   ABDOMINAL HYSTERECTOMY     endometrial cancer   CHOLECYSTECTOMY     COLONOSCOPY WITH PROPOFOL N/A 01/19/2014   VPX:[id number]   DILATION AND CURETTAGE OF UTERUS  12 yrs ago   DILATION AND CURETTAGE OF UTERUS  06/18/2011   Procedure: DILATATION AND CURETTAGE;  Surgeon: Florian Buff, MD;  Location: [facility];  Service: Gynecology;  Laterality: N/A;  Suction Dilation and Curettage   ESOPHAGOGASTRODUODENOSCOPY N/A 10/12/2013   Dr. Rourk:anastomotic ulcer likely cause of bleeding. likely ischemic    ESOPHAGOGASTRODUODENOSCOPY (EGD) WITH PROPOFOL N/A 01/19/2014   EVO:[id number]   EXTRACORPOREAL SHOCK WAVE LITHOTRIPSY Left 01/11/2018   Procedure: LEFT EXTRACORPOREAL SHOCK WAVE LITHOTRIPSY (ESWL);  Surgeon: Lucas Mallow, MD;  Location: [facility];  Service: Urology;  Laterality: Left;   EXTRACORPOREAL SHOCK WAVE LITHOTRIPSY Left 01/14/2018   Procedure: LEFT EXTRACORPOREAL SHOCK WAVE LITHOTRIPSY (ESWL);  Surgeon: Cleon Gustin, MD;  Location: [facility];  Service: Urology;  Laterality: Left;  58 MINS  W/ MAC   GASTRIC BYPASS  2013   [facility]   HYSTEROSCOPY WITH D & C  06/18/2011   Procedure: DILATATION AND CURETTAGE /HYSTEROSCOPY;  Surgeon: Florian Buff, MD;  Location: [facility];  Service: Gynecology;  Laterality: N/A;   LITHOTRIPSY     paniculectomy     SHOULDER ARTHROSCOPY WITH SUBACROMIAL DECOMPRESSION AND OPEN ROTATOR C Left 07/16/2021   Procedure: LEFT ARTHROSCOPIC SUBACROMIAL DECOMPRESSION, MINI OPEN ROTATOR CUFF TEAR REPAIR, BICEPS TENOTOMY;  Surgeon: [name], MD;  Location: [facility];  Service: Orthopedics;  Laterality: Left;   TRIGGER FINGER  RELEASE     WISDOM TOOTH EXTRACTION     Social History   Occupational History    Occupation: Event organiser employed)    Employer: NOT EMPLOYED  Tobacco Use   Smoking status: Never   Smokeless tobacco: Never  Vaping Use   Vaping Use: Never used  Substance and Sexual Activity   Alcohol use: No    Alcohol/week: 0.0 standard drinks of alcohol   Drug use: No   Sexual activity: Yes    Birth control/protection: None, Surgical

## 2021-12-03 ENCOUNTER — Telehealth: Payer: Self-pay

## 2021-12-03 NOTE — Telephone Encounter (Signed)
VOB submitted for Monovisc, bilateral knee. BV pending 

## 2022-01-12 ENCOUNTER — Other Ambulatory Visit: Payer: Self-pay | Admitting: Physician Assistant

## 2022-01-31 DIAGNOSIS — G894 Chronic pain syndrome: Secondary | ICD-10-CM | POA: Diagnosis not present

## 2022-01-31 DIAGNOSIS — M170 Bilateral primary osteoarthritis of knee: Secondary | ICD-10-CM | POA: Diagnosis not present

## 2022-01-31 DIAGNOSIS — Z79891 Long term (current) use of opiate analgesic: Secondary | ICD-10-CM | POA: Diagnosis not present

## 2022-02-06 ENCOUNTER — Other Ambulatory Visit: Payer: Self-pay

## 2022-02-06 ENCOUNTER — Emergency Department (HOSPITAL_COMMUNITY): Payer: 59

## 2022-02-06 ENCOUNTER — Encounter (HOSPITAL_COMMUNITY): Payer: Self-pay | Admitting: *Deleted

## 2022-02-06 ENCOUNTER — Inpatient Hospital Stay (HOSPITAL_COMMUNITY)
Admission: EM | Admit: 2022-02-06 | Discharge: 2022-02-10 | DRG: 853 | Disposition: A | Discharge status: Home / Self Care | Payer: 59 | Attending: Internal Medicine | Admitting: Internal Medicine

## 2022-02-06 DIAGNOSIS — D519 Vitamin B12 deficiency anemia, unspecified: Secondary | ICD-10-CM | POA: Diagnosis present

## 2022-02-06 DIAGNOSIS — E876 Hypokalemia: Secondary | ICD-10-CM | POA: Diagnosis present

## 2022-02-06 DIAGNOSIS — Z8249 Family history of ischemic heart disease and other diseases of the circulatory system: Secondary | ICD-10-CM

## 2022-02-06 DIAGNOSIS — E119 Type 2 diabetes mellitus without complications: Secondary | ICD-10-CM | POA: Diagnosis present

## 2022-02-06 DIAGNOSIS — Z841 Family history of disorders of kidney and ureter: Secondary | ICD-10-CM

## 2022-02-06 DIAGNOSIS — Z6841 Body Mass Index (BMI) 40.0 and over, adult: Secondary | ICD-10-CM

## 2022-02-06 DIAGNOSIS — Z7984 Long term (current) use of oral hypoglycemic drugs: Secondary | ICD-10-CM

## 2022-02-06 DIAGNOSIS — Z9049 Acquired absence of other specified parts of digestive tract: Secondary | ICD-10-CM

## 2022-02-06 DIAGNOSIS — A4150 Gram-negative sepsis, unspecified: Secondary | ICD-10-CM | POA: Diagnosis present

## 2022-02-06 DIAGNOSIS — Z825 Family history of asthma and other chronic lower respiratory diseases: Secondary | ICD-10-CM

## 2022-02-06 DIAGNOSIS — E66813 Obesity, class 3: Secondary | ICD-10-CM

## 2022-02-06 DIAGNOSIS — Z1152 Encounter for screening for COVID-19: Secondary | ICD-10-CM

## 2022-02-06 DIAGNOSIS — Z87442 Personal history of urinary calculi: Secondary | ICD-10-CM

## 2022-02-06 DIAGNOSIS — A4151 Sepsis due to Escherichia coli [E. coli]: Principal | ICD-10-CM | POA: Diagnosis present

## 2022-02-06 DIAGNOSIS — N136 Pyonephrosis: Secondary | ICD-10-CM | POA: Diagnosis not present

## 2022-02-06 DIAGNOSIS — R0902 Hypoxemia: Secondary | ICD-10-CM

## 2022-02-06 DIAGNOSIS — A419 Sepsis, unspecified organism: Secondary | ICD-10-CM | POA: Diagnosis present

## 2022-02-06 DIAGNOSIS — E785 Hyperlipidemia, unspecified: Secondary | ICD-10-CM | POA: Diagnosis present

## 2022-02-06 DIAGNOSIS — F1120 Opioid dependence, uncomplicated: Secondary | ICD-10-CM

## 2022-02-06 DIAGNOSIS — Z800 Family history of malignant neoplasm of digestive organs: Secondary | ICD-10-CM

## 2022-02-06 DIAGNOSIS — R7881 Bacteremia: Secondary | ICD-10-CM

## 2022-02-06 DIAGNOSIS — J9601 Acute respiratory failure with hypoxia: Secondary | ICD-10-CM | POA: Diagnosis present

## 2022-02-06 DIAGNOSIS — N10 Acute tubulo-interstitial nephritis: Secondary | ICD-10-CM

## 2022-02-06 DIAGNOSIS — B9689 Other specified bacterial agents as the cause of diseases classified elsewhere: Secondary | ICD-10-CM | POA: Diagnosis present

## 2022-02-06 DIAGNOSIS — K120 Recurrent oral aphthae: Secondary | ICD-10-CM | POA: Diagnosis present

## 2022-02-06 DIAGNOSIS — I10 Essential (primary) hypertension: Secondary | ICD-10-CM | POA: Diagnosis present

## 2022-02-06 DIAGNOSIS — N179 Acute kidney failure, unspecified: Secondary | ICD-10-CM | POA: Diagnosis present

## 2022-02-06 DIAGNOSIS — M797 Fibromyalgia: Secondary | ICD-10-CM | POA: Diagnosis present

## 2022-02-06 DIAGNOSIS — R8281 Pyuria: Secondary | ICD-10-CM

## 2022-02-06 DIAGNOSIS — F419 Anxiety disorder, unspecified: Secondary | ICD-10-CM | POA: Diagnosis present

## 2022-02-06 DIAGNOSIS — G894 Chronic pain syndrome: Secondary | ICD-10-CM | POA: Diagnosis present

## 2022-02-06 DIAGNOSIS — Z823 Family history of stroke: Secondary | ICD-10-CM

## 2022-02-06 DIAGNOSIS — D509 Iron deficiency anemia, unspecified: Secondary | ICD-10-CM | POA: Diagnosis present

## 2022-02-06 DIAGNOSIS — Z79899 Other long term (current) drug therapy: Secondary | ICD-10-CM

## 2022-02-06 DIAGNOSIS — B9620 Unspecified Escherichia coli [E. coli] as the cause of diseases classified elsewhere: Secondary | ICD-10-CM | POA: Diagnosis present

## 2022-02-06 DIAGNOSIS — Z9884 Bariatric surgery status: Secondary | ICD-10-CM

## 2022-02-06 DIAGNOSIS — J449 Chronic obstructive pulmonary disease, unspecified: Secondary | ICD-10-CM | POA: Diagnosis present

## 2022-02-06 DIAGNOSIS — G4733 Obstructive sleep apnea (adult) (pediatric): Secondary | ICD-10-CM | POA: Diagnosis present

## 2022-02-06 DIAGNOSIS — N12 Tubulo-interstitial nephritis, not specified as acute or chronic: Principal | ICD-10-CM

## 2022-02-06 DIAGNOSIS — Z79891 Long term (current) use of opiate analgesic: Secondary | ICD-10-CM

## 2022-02-06 DIAGNOSIS — Z833 Family history of diabetes mellitus: Secondary | ICD-10-CM

## 2022-02-06 DIAGNOSIS — Z90710 Acquired absence of both cervix and uterus: Secondary | ICD-10-CM

## 2022-02-06 DIAGNOSIS — R7989 Other specified abnormal findings of blood chemistry: Secondary | ICD-10-CM | POA: Diagnosis present

## 2022-02-06 DIAGNOSIS — Z8542 Personal history of malignant neoplasm of other parts of uterus: Secondary | ICD-10-CM

## 2022-02-06 DIAGNOSIS — E039 Hypothyroidism, unspecified: Secondary | ICD-10-CM | POA: Diagnosis present

## 2022-02-06 DIAGNOSIS — F32A Depression, unspecified: Secondary | ICD-10-CM | POA: Diagnosis present

## 2022-02-06 DIAGNOSIS — Z791 Long term (current) use of non-steroidal anti-inflammatories (NSAID): Secondary | ICD-10-CM

## 2022-02-06 DIAGNOSIS — Z881 Allergy status to other antibiotic agents status: Secondary | ICD-10-CM

## 2022-02-06 LAB — CBC WITH DIFFERENTIAL/PLATELET
Abs Immature Granulocytes: 0.04 10*3/uL (ref 0.00–0.07)
Basophils Absolute: 0 10*3/uL (ref 0.0–0.1)
Basophils Relative: 0 %
Eosinophils Absolute: 0.1 10*3/uL (ref 0.0–0.5)
Eosinophils Relative: 1 %
HCT: 37.4 % (ref 36.0–46.0)
Hemoglobin: 11.2 g/dL — ABNORMAL LOW (ref 12.0–15.0)
Immature Granulocytes: 0 %
Lymphocytes Relative: 6 %
Lymphs Abs: 0.6 10*3/uL — ABNORMAL LOW (ref 0.7–4.0)
MCH: 26.4 pg (ref 26.0–34.0)
MCHC: 29.9 g/dL — ABNORMAL LOW (ref 30.0–36.0)
MCV: 88.2 fL (ref 80.0–100.0)
Monocytes Absolute: 1 10*3/uL (ref 0.1–1.0)
Monocytes Relative: 10 %
Neutro Abs: 7.9 10*3/uL — ABNORMAL HIGH (ref 1.7–7.7)
Neutrophils Relative %: 83 %
Platelets: 264 10*3/uL (ref 150–400)
RBC: 4.24 MIL/uL (ref 3.87–5.11)
RDW: 14.6 % (ref 11.5–15.5)
WBC: 9.6 10*3/uL (ref 4.0–10.5)
nRBC: 0 % (ref 0.0–0.2)

## 2022-02-06 LAB — COMPREHENSIVE METABOLIC PANEL
ALT: 15 U/L (ref 0–44)
AST: 18 U/L (ref 15–41)
Albumin: 3.9 g/dL (ref 3.5–5.0)
Alkaline Phosphatase: 98 U/L (ref 38–126)
Anion gap: 10 (ref 5–15)
BUN: 14 mg/dL (ref 6–20)
CO2: 23 mmol/L (ref 22–32)
Calcium: 8.4 mg/dL — ABNORMAL LOW (ref 8.9–10.3)
Chloride: 104 mmol/L (ref 98–111)
Creatinine, Ser: 1.12 mg/dL — ABNORMAL HIGH (ref 0.44–1.00)
GFR, Estimated: 58 mL/min — ABNORMAL LOW (ref >60)
Glucose, Bld: 135 mg/dL — ABNORMAL HIGH (ref 70–99)
Potassium: 3 mmol/L — ABNORMAL LOW (ref 3.5–5.1)
Sodium: 137 mmol/L (ref 135–145)
Total Bilirubin: 0.7 mg/dL (ref 0.3–1.2)
Total Protein: 7.5 g/dL (ref 6.5–8.1)

## 2022-02-06 LAB — URINALYSIS, ROUTINE W REFLEX MICROSCOPIC
Bilirubin Urine: NEGATIVE
Glucose, UA: NEGATIVE mg/dL
Ketones, ur: NEGATIVE mg/dL
Nitrite: POSITIVE — AB
Protein, ur: 30 mg/dL — AB
Specific Gravity, Urine: 1.015 (ref 1.005–1.030)
pH: 5 (ref 5.0–8.0)

## 2022-02-06 LAB — RESP PANEL BY RT-PCR (FLU A&B, COVID) ARPGX2
Influenza A by PCR: NEGATIVE
Influenza B by PCR: NEGATIVE
SARS Coronavirus 2 by RT PCR: NEGATIVE

## 2022-02-06 LAB — APTT: aPTT: 28 seconds (ref 24–36)

## 2022-02-06 LAB — PROTIME-INR
INR: 1.1 (ref 0.8–1.2)
Prothrombin Time: 13.9 seconds (ref 11.4–15.2)

## 2022-02-06 LAB — GLUCOSE, CAPILLARY: Glucose-Capillary: 180 mg/dL — ABNORMAL HIGH (ref 70–99)

## 2022-02-06 LAB — LACTIC ACID, PLASMA
Lactic Acid, Venous: 1.6 mmol/L (ref 0.5–1.9)
Lactic Acid, Venous: 1.6 mmol/L (ref 0.5–1.9)

## 2022-02-06 LAB — HIV ANTIBODY (ROUTINE TESTING W REFLEX): HIV Screen 4th Generation wRfx: NONREACTIVE

## 2022-02-06 MED ORDER — IBUPROFEN 800 MG PO TABS: 800.0000 mg | ORAL_TABLET | Freq: Once | ORAL | Status: DC

## 2022-02-06 MED ORDER — FAMOTIDINE 20 MG PO TABS
40.0000 mg | ORAL_TABLET | Freq: Every day | ORAL | Status: DC
  Filled 2022-02-06: qty 2

## 2022-02-06 MED ORDER — GABAPENTIN 400 MG PO CAPS
1200.0000 mg | ORAL_CAPSULE | Freq: Two times a day (BID) | ORAL | Status: DC
  Administered 2022-02-06 – 2022-02-10 (×8): 1200 mg via ORAL
  Filled 2022-02-06 (×8): qty 3

## 2022-02-06 MED ORDER — ONDANSETRON HCL 4 MG/2ML IJ SOLN: 4.0000 mg | Freq: Once | INTRAMUSCULAR | Status: DC

## 2022-02-06 MED ORDER — LACTATED RINGERS IV SOLN: INTRAVENOUS | Status: DC

## 2022-02-06 MED ORDER — FAMOTIDINE 20 MG PO TABS: 40.0000 mg | ORAL_TABLET | Freq: Two times a day (BID) | ORAL | Status: DC

## 2022-02-06 MED ORDER — SUMATRIPTAN SUCCINATE 50 MG PO TABS
100.0000 mg | ORAL_TABLET | ORAL | Status: DC | PRN
  Administered 2022-02-06: 100 mg via ORAL
  Filled 2022-02-06: qty 2

## 2022-02-06 MED ORDER — METOPROLOL SUCCINATE ER 25 MG PO TB24
25.0000 mg | ORAL_TABLET | Freq: Every day | ORAL | Status: DC
  Administered 2022-02-07 – 2022-02-10 (×3): 25 mg via ORAL
  Filled 2022-02-06 (×4): qty 1

## 2022-02-06 MED ORDER — SODIUM CHLORIDE 0.9 % IV BOLUS
1000.0000 mL | Freq: Once | INTRAVENOUS | Status: AC
  Administered 2022-02-06: 1000 mL via INTRAVENOUS

## 2022-02-06 MED ORDER — ACETAMINOPHEN 650 MG RE SUPP: 650.0000 mg | Freq: Four times a day (QID) | RECTAL | Status: DC | PRN

## 2022-02-06 MED ORDER — ALPRAZOLAM 1 MG PO TABS
1.0000 mg | ORAL_TABLET | Freq: Three times a day (TID) | ORAL | Status: DC | PRN
  Administered 2022-02-06 – 2022-02-10 (×4): 1 mg via ORAL
  Filled 2022-02-06: qty 2
  Filled 2022-02-06 (×3): qty 1

## 2022-02-06 MED ORDER — OXYCODONE-ACETAMINOPHEN 5-325 MG PO TABS
1.0000 | ORAL_TABLET | Freq: Four times a day (QID) | ORAL | Status: DC | PRN
  Administered 2022-02-07: 1 via ORAL
  Filled 2022-02-06: qty 1

## 2022-02-06 MED ORDER — OXYCODONE HCL 5 MG PO TABS: 5.0000 mg | ORAL_TABLET | Freq: Four times a day (QID) | ORAL | Status: DC | PRN

## 2022-02-06 MED ORDER — POTASSIUM CHLORIDE CRYS ER 20 MEQ PO TBCR
20.0000 meq | EXTENDED_RELEASE_TABLET | Freq: Once | ORAL | Status: AC
  Administered 2022-02-06: 20 meq via ORAL
  Filled 2022-02-06: qty 1

## 2022-02-06 MED ORDER — ACETAMINOPHEN 325 MG PO TABS
650.0000 mg | ORAL_TABLET | Freq: Four times a day (QID) | ORAL | Status: DC | PRN
  Administered 2022-02-06: 650 mg via ORAL
  Filled 2022-02-06: qty 2

## 2022-02-06 MED ORDER — OXYCODONE-ACETAMINOPHEN 10-325 MG PO TABS: 1.0000 | ORAL_TABLET | Freq: Four times a day (QID) | ORAL | Status: DC | PRN

## 2022-02-06 MED ORDER — ACETAMINOPHEN 325 MG PO TABS
650.0000 mg | ORAL_TABLET | Freq: Once | ORAL | Status: AC | PRN
  Administered 2022-02-06: 650 mg via ORAL
  Filled 2022-02-06: qty 2

## 2022-02-06 MED ORDER — LAMOTRIGINE 100 MG PO TABS
200.0000 mg | ORAL_TABLET | Freq: Two times a day (BID) | ORAL | Status: DC
  Administered 2022-02-06 – 2022-02-10 (×8): 200 mg via ORAL
  Filled 2022-02-06 (×8): qty 2

## 2022-02-06 MED ORDER — INFLUENZA VAC SPLIT QUAD 0.5 ML IM SUSY: 0.5000 mL | PREFILLED_SYRINGE | INTRAMUSCULAR | Status: DC

## 2022-02-06 MED ORDER — SODIUM CHLORIDE 0.9 % IV SOLN
2.0000 g | Freq: Three times a day (TID) | INTRAVENOUS | Status: DC
  Administered 2022-02-06 – 2022-02-09 (×9): 2 g via INTRAVENOUS
  Filled 2022-02-06 (×16): qty 10

## 2022-02-06 MED ORDER — HYDROMORPHONE HCL 1 MG/ML IJ SOLN
1.0000 mg | Freq: Once | INTRAMUSCULAR | Status: AC
  Administered 2022-02-06: 1 mg via INTRAVENOUS
  Filled 2022-02-06: qty 1

## 2022-02-06 MED ORDER — ONDANSETRON HCL 4 MG PO TABS: 4.0000 mg | ORAL_TABLET | Freq: Four times a day (QID) | ORAL | Status: DC | PRN

## 2022-02-06 MED ORDER — ONDANSETRON HCL 4 MG/2ML IJ SOLN: 4.0000 mg | Freq: Four times a day (QID) | INTRAMUSCULAR | Status: DC | PRN

## 2022-02-06 MED ORDER — INSULIN ASPART 100 UNIT/ML IJ SOLN
0.0000 [IU] | Freq: Three times a day (TID) | INTRAMUSCULAR | Status: DC
  Administered 2022-02-07 – 2022-02-08 (×2): 2 [IU] via SUBCUTANEOUS
  Administered 2022-02-08: 3 [IU] via SUBCUTANEOUS
  Administered 2022-02-08 – 2022-02-09 (×2): 1 [IU] via SUBCUTANEOUS

## 2022-02-06 MED ORDER — SODIUM CHLORIDE 0.9 % IV SOLN
8.0000 mg/kg | Freq: Every day | INTRAVENOUS | Status: DC
  Administered 2022-02-06: 650 mg via INTRAVENOUS
  Filled 2022-02-06 (×2): qty 13

## 2022-02-06 MED ORDER — ONDANSETRON HCL 4 MG/2ML IJ SOLN
4.0000 mg | Freq: Once | INTRAMUSCULAR | Status: AC
  Administered 2022-02-06: 4 mg via INTRAVENOUS
  Filled 2022-02-06: qty 2

## 2022-02-06 MED ORDER — ENOXAPARIN SODIUM 60 MG/0.6ML IJ SOSY
60.0000 mg | PREFILLED_SYRINGE | INTRAMUSCULAR | Status: DC
  Administered 2022-02-06 – 2022-02-09 (×4): 60 mg via SUBCUTANEOUS
  Filled 2022-02-06 (×5): qty 0.6

## 2022-02-06 MED ORDER — LINAGLIPTIN 5 MG PO TABS
5.0000 mg | ORAL_TABLET | Freq: Every day | ORAL | Status: DC
  Filled 2022-02-06 (×2): qty 1

## 2022-02-06 MED ORDER — SIMVASTATIN 10 MG PO TABS: 20.0000 mg | ORAL_TABLET | Freq: Every day | ORAL | Status: DC

## 2022-02-06 MED ORDER — GABAPENTIN 300 MG PO CAPS
600.0000 mg | ORAL_CAPSULE | Freq: Every day | ORAL | Status: DC
  Administered 2022-02-07: 600 mg via ORAL
  Filled 2022-02-06 (×2): qty 2

## 2022-02-06 MED ORDER — ENOXAPARIN SODIUM 40 MG/0.4ML IJ SOSY: 40.0000 mg | PREFILLED_SYRINGE | INTRAMUSCULAR | Status: DC

## 2022-02-06 NOTE — Assessment & Plan Note (Addendum)
Check hemoglobin A1c--6.4 Holding sitagliptin NovoLog sliding scale

## 2022-02-06 NOTE — H&P (Signed)
History and Physical    Patient: Molly Berry OEU:[id number] DOB: [birth year] DOA: 02/06/2022 DOS: the patient was seen and examined on 02/06/2022 PCP: Molly Rough, PA-C  Patient coming from: Home  Chief Complaint:  Chief Complaint  Patient presents with   Fever   HPI: Molly Berry is a 56 year old female with a history of COPD, hypertension, hypothyroidism, hyperlipidemia, fibromyalgia, opioid dependence presenting with fevers, chills, with associated nausea and vomiting.  The patient presented to [facility] on 02/04/2022 with similar symptoms.  She stated that all her symptomatology began on 02/03/2022.  The patient was discharged home from the ED with Macrobid, Robaxin.  She stated that her symptoms have persisted with continued dry heaves.  She endorses dysuria.  She has tried  Azo as an outpatient without much improvement.  She complains of abdominal discomfort and right flank pain.  She states that she has pain all over.  She states that she intermittently has some shortness of breath but denies any chest pain, headache, diarrhea, hematochezia, melena.  In the ED, the patient was febrile up to 103.1 F.  She was hemodynamically stable with oxygen saturation 88% on room air.  WBC 9.6, hemoglobin 1.2, platelets 264,000.  LFTs were unremarkable.  Sodium 137, potassium 3.0, bicarbonate 20, serum creatinine 1.12.  UA shows 21-50 WBC, + nitrite. CT of the abdomen and pelvis on 02/04/2022 at [facility] showed bilateral nonobstructive nephrolithiasis with a 7 mm stone in the left renal pelvis.  There is no hydronephrosis.  There is no bowel obstruction or bowel wall thickening.  The patient was started on daptomycin and aztreonam.  Review of Systems: As mentioned in the history of present illness. All other systems reviewed and are negative. Past Medical History:  Diagnosis Date   Allergy    Anemia    Anxiety    Arthritis    knees, multiple joints   Asthma    Bell palsy    Blood transfusion without  reported diagnosis    Breast mass    lt breast mass x's 3 years increased in size   Chronic kidney disease    Chronic pain syndrome    COPD (chronic obstructive pulmonary disease) (HCC)    Depression    Diabetes mellitus    Endometrial cancer (HCC)    Fibromyalgia    GERD (gastroesophageal reflux disease)    H/O gastric bypass 05/09/2014   At [facility]   Headache    mirgraines   Hyperlipidemia    Hypertension    Hypothyroidism    Multiple pulmonary nodules 11/30/2012   Followed in Pulmonary clinic/ [facility]/ [place]  - See CT abd  10/29/12  New right lower lobe pulmonary nodularity, primarily ground-  glass in density. This could reflect an inflammatory process,  although follow-up is necessary to exclude atypical neoplasm. Full  chest CT should be considered to evaluate for other pulmonary  findings.      Multiple thyroid nodules    Nephropathy    Pneumonia    PONV (postoperative nausea and vomiting)    Pulmonary nodules    Thyroid disease    Ulcer    Uterine cancer ([place]) 12/26/2011   Stage 1, grade 1, S/P hysterectomy initially by robotic technique and then salpingo-oophorectomy at [facility] receiving no postoperative treatment.   Past Surgical History:  Procedure Laterality Date   ABDOMINAL HYSTERECTOMY     endometrial cancer   CHOLECYSTECTOMY     COLONOSCOPY WITH PROPOFOL N/A 01/19/2014   XVQ:[id number]   DILATION AND  CURETTAGE OF UTERUS  12 yrs ago   [place] UTERUS  06/18/2011   Procedure: DILATATION AND CURETTAGE;  Surgeon: Florian Buff, MD;  Location: [facility];  Service: Gynecology;  Laterality: N/A;  Suction Dilation and Curettage   ESOPHAGOGASTRODUODENOSCOPY N/A 10/12/2013   Dr. Rourk:anastomotic ulcer likely cause of bleeding. likely ischemic    ESOPHAGOGASTRODUODENOSCOPY (EGD) WITH PROPOFOL N/A 01/19/2014   HTD:[id number]   EXTRACORPOREAL SHOCK WAVE LITHOTRIPSY Left 01/11/2018   Procedure: LEFT EXTRACORPOREAL SHOCK WAVE LITHOTRIPSY (ESWL);  Surgeon:  Lucas Mallow, MD;  [facility];  Service: Urology;  Laterality: Left;   EXTRACORPOREAL SHOCK WAVE LITHOTRIPSY Left 01/14/2018   Procedure: LEFT EXTRACORPOREAL SHOCK WAVE LITHOTRIPSY (ESWL);  Surgeon: Cleon Gustin, MD;  [facility];  Service: Urology;  Laterality: Left;  82 MINS  W/ MAC   GASTRIC BYPASS  2013   [facility]   HYSTEROSCOPY WITH D & C  06/18/2011   Procedure: DILATATION AND CURETTAGE /HYSTEROSCOPY;  Surgeon: Florian Buff, MD;  Location: [facility];  Service: Gynecology;  Laterality: N/A;   LITHOTRIPSY     paniculectomy     SHOULDER ARTHROSCOPY WITH SUBACROMIAL DECOMPRESSION AND OPEN ROTATOR C Left 07/16/2021   Procedure: LEFT ARTHROSCOPIC SUBACROMIAL DECOMPRESSION, MINI OPEN ROTATOR CUFF TEAR REPAIR, BICEPS TENOTOMY;  Surgeon: [name], MD;  [facility];  Service: Orthopedics;  Laterality: Left;   TRIGGER FINGER RELEASE     WISDOM TOOTH EXTRACTION     Social History:  reports that she has never smoked. She has never used smokeless tobacco. She reports that she does not drink alcohol and does not use drugs.  Allergies  Allergen Reactions   Bee Venom Shortness Of Breath, Swelling and Rash   Cephalexin Anaphylaxis   Vancomycin Anaphylaxis and Hives    Has taken for bronchitis under medical supervision on site with benadryl   Equetro [Carbamazepine]     Loss of balance, trembling, inability to think, resulting in ER visit shortly after start of course    Adhesive [Tape] Rash   Cefotaxime Rash   Ciprofloxacin Hives and Rash   Clindamycin Rash   Latex Rash   Penicillins Hives and Rash    Has patient had a PCN reaction causing immediate rash, facial/tongue/throat swelling, SOB or lightheadedness with hypotension: Unknown Has patient had a PCN reaction causing severe rash involving mucus membranes or skin necrosis: Unknown Has patient had a PCN reaction that required hospitalization: Unknown Has patient had a PCN reaction occurring within the last  10 years: No If all of the above answers are "NO", then may proceed with Cephalosporin use.   Sulfa Antibiotics Rash    Family History  Problem Relation Age of Onset   Pneumonia Mother        Deceased   Arthritis Mother    Asthma Mother    Cancer Mother        pancreatic   COPD Mother    Depression Mother    Diabetes Mother    Kidney disease Mother    Liver cancer Father        Living   Arthritis Father    Cancer Father        liver, prostate   COPD Father    Depression Father    Stroke Father    Heart disease Maternal Grandmother    Mental illness Maternal Grandmother    Breast cancer Maternal Grandmother    Alcohol abuse Paternal Grandfather    Breast cancer  Paternal Aunt    Anesthesia problems Neg Hx    Hypotension Neg Hx    Malignant hyperthermia Neg Hx    Pseudochol deficiency Neg Hx    Colon cancer Neg Hx     Prior to Admission medications   Medication Sig Start Date End Date Taking? Authorizing Provider  albuterol (PROVENTIL HFA;VENTOLIN HFA) 108 (90 BASE) MCG/ACT inhaler Inhale 2 puffs into the lungs every 6 (six) hours as needed for shortness of breath or wheezing.    [provider]  ALPRAZolam Duanne Moron) 1 MG tablet Take 1 mg by mouth 3 (three) times daily as needed for anxiety or sleep.    [provider]  amphetamine-dextroamphetamine (ADDERALL) 10 MG tablet Take 10-20 mg by mouth daily at 12 noon. 10/23/17   [provider]  amphetamine-dextroamphetamine (ADDERALL) 20 MG tablet Take 40 mg by mouth every morning.  07/14/16   [provider]  buPROPion (WELLBUTRIN SR) 150 MG 12 hr tablet Take 150 mg by mouth 2 (two) times daily. 10/03/17   [provider]  dicyclomine (BENTYL) 20 MG tablet Take 20 mg by mouth 3 (three) times daily as needed for spasms. 03/30/21   [provider]  diphenhydrAMINE (BENADRYL) 25 mg capsule Take 25 mg by mouth at bedtime.    [provider]  famotidine (PEPCID) 40 MG tablet  Take 40 mg by mouth 2 (two) times daily.    [provider]  furosemide (LASIX) 20 MG tablet Take 20 mg by mouth daily. 02/15/14   [provider]  gabapentin (NEURONTIN) 600 MG tablet Take 600-1,800 mg by mouth See admin instructions. Take 1200 mg by mouth in the morning, 600 mg in the afternoon and 1200 mg at night    [provider]  lamoTRIgine (LAMICTAL) 200 MG tablet Take 200 mg by mouth 2 (two) times daily.    [provider]  meloxicam (MOBIC) 15 MG tablet Take 1 tablet (15 mg total) by mouth daily. 11/07/21   Persons, Bevely Palmer, PA  methocarbamol (ROBAXIN) 750 MG tablet Take 750 mg by mouth 3 (three) times daily as needed for muscle spasms. 05/02/21   [provider]  metoprolol succinate (TOPROL-XL) 50 MG 24 hr tablet Take 50 mg by mouth daily. 12/10/16   [provider]  nitrofurantoin, macrocrystal-monohydrate, (MACROBID) 100 MG capsule Take 1 capsule (100 mg total) by mouth 2 (two) times daily. 08/06/21   Volney American, PA-C  ondansetron (ZOFRAN) 8 MG tablet Take 8 mg by mouth every 8 (eight) hours as needed for nausea or vomiting.  11/24/13   [provider]  oxyCODONE-acetaminophen (PERCOCET) 10-325 MG tablet Take 1 tablet by mouth every 4 (four) hours as needed for pain. 07/16/21   [name], MD  oxyCODONE-acetaminophen (PERCOCET/ROXICET) 5-325 MG tablet Take 1 tablet by mouth every 8 (eight) hours as needed for severe pain. 07/22/21   [name], MD  phenazopyridine (PYRIDIUM) 200 MG tablet Take 1 tablet (200 mg total) by mouth 3 (three) times daily as needed for pain. 08/06/21   Volney American, PA-C  simvastatin (ZOCOR) 20 MG tablet Take 20 mg by mouth daily.  05/29/16   [provider]  sitaGLIPtin (JANUVIA) 100 MG tablet Take 100 mg by mouth daily.    [provider]  SUMAtriptan (IMITREX) 100 MG tablet Take 100 mg by mouth every 2 (two) hours as needed for migraine. 07/12/16    [provider]  vortioxetine HBr (TRINTELLIX) 20 MG TABS tablet  Take 20 mg by mouth daily.    [provider]    Physical Exam: Vitals:   02/06/22 1330 02/06/22 1400 02/06/22 1423 02/06/22 1430  BP: (!) 125/54 (!) 164/89  (!) 171/143  Pulse: 68 70  92  Resp: 17 16  (!) 28  Temp:   (!) 103 F (39.4 C)   TempSrc:   Oral   SpO2: 90% 92%  92%  Weight:      Height:       GENERAL:  A&O x 3, NAD, well developed, cooperative, follows commands HEENT: [place]/AT, No thrush, No icterus, No oral ulcers Neck:  No neck mass, No meningismus, soft, supple CV: RRR, no S3, no S4, no rub, no JVD Lungs:  CTA, no wheeze, no rhonchi, good air movement Abd: soft/NT +BS, nondistended Ext: No edema, no lymphangitis, no cyanosis, no rashes Neuro:  CN II-XII intact, strength 4/5 in RUE, RLE, strength 4/5 LUE, LLE; sensation intact bilateral; no dysmetria; babinski equivocal  Data Reviewed: Results reviewed above in history  Assessment and Plan: * Sepsis due to undetermined organism [facility]) Source unclear presently although suspect urinary source Start aztreonam Start daptomycin pending culture data Lactic acid 1.6 Check PCT Chest ray without any infiltrates follow blood and urine cultures   Opioid dependence (HCC) PDMP reviewed -Patient receives Percocet 10/325, #90, last refill 01/22/2022 -Patient received Xanax, 1 mg, #90, last refill 01/13/2022  Hypoxia Oxygen saturation down to 86% on room air Personally reviewed chest x-ray--points duration, but no infiltrates or edema Check D-dimer Suspect component of OSA/OHS  Obesity, Class III, BMI 40-49.9 (morbid obesity) (HCC) BMI 44.29 Lifestyle modification  Fibromyalgia syndrome Continue Percocet and Xanax  Essential hypertension, benign Continue metoprolol succinate  Controlled type 2 diabetes mellitus without complication, without long-term current use of insulin (HCC) Check hemoglobin A1c NovoLog sliding scale       Advance Care Planning: FULL  Consults: none  Family Communication: spouse updated 10/5  Severity of Illness: The appropriate patient status for this patient is OBSERVATION. Observation status is judged to be reasonable and necessary in order to provide the required intensity of service to ensure the patient's safety. The patient's presenting symptoms, physical exam findings, and initial radiographic and laboratory data in the context of their medical condition is felt to place them at decreased risk for further clinical deterioration. Furthermore, it is anticipated that the patient will be medically stable for discharge from the hospital within 2 midnights of admission.   Author: Orson Eva, MD 02/06/2022 3:40 PM  For on call review [email or web address].

## 2022-02-06 NOTE — ED Provider Notes (Signed)
[facility] EMERGENCY DEPARTMENT Provider Note   CSN: [id number] Arrival date & time: 02/06/22  1130     History  Chief Complaint  Patient presents with   Fever    Molly Berry is a 56 y.o. female.   Fever    This patient is a 56 year old female, she presents with a complaint of a fever body aches and some abdominal discomfort.  She has had nausea and vomiting, she has had occasional cough, she did start with a sore throat yesterday and was exposed to 2 children who had the flu, her grandchildren within the last week.  She was seen at the outside hospital emergency department and had testing that showed possible UTI, started on Macrobid, also showed that she had nephrolithiasis but no ureterolithiasis or other complicating factors on CT scan.  She presents with fever today  Home Medications Prior to Admission medications   Medication Sig Start Date End Date Taking? Authorizing Provider  albuterol (PROVENTIL HFA;VENTOLIN HFA) 108 (90 BASE) MCG/ACT inhaler Inhale 2 puffs into the lungs every 6 (six) hours as needed for shortness of breath or wheezing.    [provider]  ALPRAZolam Duanne Moron) 1 MG tablet Take 1 mg by mouth 3 (three) times daily as needed for anxiety or sleep.    [provider]  amphetamine-dextroamphetamine (ADDERALL) 10 MG tablet Take 10-20 mg by mouth daily at 12 noon. 10/23/17   [provider]  amphetamine-dextroamphetamine (ADDERALL) 20 MG tablet Take 40 mg by mouth every morning.  07/14/16   [provider]  buPROPion (WELLBUTRIN SR) 150 MG 12 hr tablet Take 150 mg by mouth 2 (two) times daily. 10/03/17   [provider]  dicyclomine (BENTYL) 20 MG tablet Take 20 mg by mouth 3 (three) times daily as needed for spasms. 03/30/21   [provider]  diphenhydrAMINE (BENADRYL) 25 mg capsule Take 25 mg by mouth at bedtime.    [provider]  famotidine (PEPCID) 40 MG tablet Take 40 mg by mouth 2 (two) times  daily.    [provider]  furosemide (LASIX) 20 MG tablet Take 20 mg by mouth daily. 02/15/14   [provider]  gabapentin (NEURONTIN) 600 MG tablet Take 600-1,800 mg by mouth See admin instructions. Take 1200 mg by mouth in the morning, 600 mg in the afternoon and 1200 mg at night    [provider]  lamoTRIgine (LAMICTAL) 200 MG tablet Take 200 mg by mouth 2 (two) times daily.    [provider]  meloxicam (MOBIC) 15 MG tablet Take 1 tablet (15 mg total) by mouth daily. 11/07/21   Persons, Bevely Palmer, PA  methocarbamol (ROBAXIN) 750 MG tablet Take 750 mg by mouth 3 (three) times daily as needed for muscle spasms. 05/02/21   [provider]  metoprolol succinate (TOPROL-XL) 50 MG 24 hr tablet Take 50 mg by mouth daily. 12/10/16   [provider]  nitrofurantoin, macrocrystal-monohydrate, (MACROBID) 100 MG capsule Take 1 capsule (100 mg total) by mouth 2 (two) times daily. 08/06/21   Volney American, PA-C  ondansetron (ZOFRAN) 8 MG tablet Take 8 mg by mouth every 8 (eight) hours as needed for nausea or vomiting.  11/24/13   [provider]  oxyCODONE-acetaminophen (PERCOCET) 10-325 MG tablet Take 1 tablet by mouth every 4 (four) hours as needed for pain. 07/16/21   [name], MD  oxyCODONE-acetaminophen (PERCOCET/ROXICET) 5-325 MG tablet Take 1 tablet by mouth every 8 (eight) hours as needed  for severe pain. 07/22/21   [name], MD  phenazopyridine (PYRIDIUM) 200 MG tablet Take 1 tablet (200 mg total) by mouth 3 (three) times daily as needed for pain. 08/06/21   Volney American, PA-C  simvastatin (ZOCOR) 20 MG tablet Take 20 mg by mouth daily.  05/29/16   [provider]  sitaGLIPtin (JANUVIA) 100 MG tablet Take 100 mg by mouth daily.    [provider]  SUMAtriptan (IMITREX) 100 MG tablet Take 100 mg by mouth every 2 (two) hours as needed for migraine. 07/12/16   [provider]   vortioxetine HBr (TRINTELLIX) 20 MG TABS tablet Take 20 mg by mouth daily.    [provider]      Allergies    Bee venom, Cephalexin, Vancomycin, Equetro [carbamazepine], Adhesive [tape], Cefotaxime, Ciprofloxacin, Clindamycin, Latex, Penicillins, and Sulfa antibiotics    Review of Systems   Review of Systems  Constitutional:  Positive for fever.  All other systems reviewed and are negative.   Physical Exam Updated Vital Signs BP (!) 199/107   Pulse 75   Temp (!) 103.1 F (39.5 C) (Oral)   Resp 20   Ht 1.626 m ('5\' 4"'$ )   Wt 117 kg   LMP 05/06/2011   SpO2 96%   BMI 44.29 kg/m  Physical Exam Vitals and nursing note reviewed.  Constitutional:      General: She is not in acute distress.    Appearance: She is well-developed.  HENT:     Head: Normocephalic and atraumatic.     Mouth/Throat:     Pharynx: No oropharyngeal exudate.  Eyes:     General: No scleral icterus.       Right eye: No discharge.        Left eye: No discharge.     Conjunctiva/sclera: Conjunctivae normal.     Pupils: Pupils are equal, round, and reactive to light.  Neck:     Thyroid: No thyromegaly.     Vascular: No JVD.  Cardiovascular:     Rate and Rhythm: Normal rate and regular rhythm.     Heart sounds: Normal heart sounds. No murmur heard.    No friction rub. No gallop.  Pulmonary:     Effort: Pulmonary effort is normal. No respiratory distress.     Breath sounds: Normal breath sounds. No wheezing or rales.  Abdominal:     General: Bowel sounds are normal. There is no distension.     Palpations: Abdomen is soft. There is no mass.     Tenderness: There is abdominal tenderness.     Comments: Mild bilateral lower abdominal tenderness to palpation but no peritoneal signs, very soft abdomen  Musculoskeletal:        General: No tenderness. Normal range of motion.     Cervical back: Normal range of motion and neck supple.     Right lower leg: No edema.     Left lower leg: No edema.   Lymphadenopathy:     Cervical: No cervical adenopathy.  Skin:    General: Skin is warm and dry.     Findings: No erythema or rash.  Neurological:     Mental Status: She is alert.     Coordination: Coordination normal.  Psychiatric:        Behavior: Behavior normal.     ED Results / Procedures / Treatments   Labs (all labs ordered are listed, but only abnormal results are displayed) Labs Reviewed  RESP PANEL BY RT-PCR (FLU A&B,  COVID) ARPGX2  CULTURE, BLOOD (ROUTINE X 2)  CULTURE, BLOOD (ROUTINE X 2)  URINE CULTURE  LACTIC ACID, PLASMA  LACTIC ACID, PLASMA  COMPREHENSIVE METABOLIC PANEL  CBC WITH DIFFERENTIAL/PLATELET  URINALYSIS, ROUTINE W REFLEX MICROSCOPIC  PROTIME-INR  APTT  POC URINE PREG, ED    EKG EKG Interpretation  Date/Time:  Thursday February 06 2022 12:19:31 EDT Ventricular Rate:  77 PR Interval:  170 QRS Duration: 82 QT Interval:  354 QTC Calculation: 400 R Axis:   -13 Text Interpretation: Normal sinus rhythm Anterolateral infarct (cited on or before 17-Jul-2021) Abnormal ECG When compared with ECG of 17-Jul-2021 17:05, Questionable change in initial forces of Lateral leads Confirmed by Noemi Chapel [phone]) on 02/06/2022 12:22:27 PM  Radiology No results found.  Procedures Procedures    Medications Ordered in ED Medications  lactated ringers infusion (has no administration in time range)  acetaminophen (TYLENOL) tablet 650 mg (650 mg Oral Given 02/06/22 1214)    ED Course/ Medical Decision Making/ A&P                           Medical Decision Making Amount and/or Complexity of Data Reviewed Labs: ordered. Radiology: ordered.  Risk OTC drugs. Prescription drug management. Decision regarding hospitalization.   This patient has what appears to be a normal heart rhythm, she has a temperature of 103, she was exposed to the flu making a viral illness more likely.  Additionally she had no signs of ureterolithiasis or "infected stone".   Furthermore the urinalysis at the outside hospital was unconvincing of urinary tract infection.  We will repeat urinalysis with culture, check for flu and COVID, the patient is agreeable to the plan.  This patient presents to the ED for concern of fever and flank pain, this involves an extensive number of treatment options, and is a complaint that carries with it a high risk of complications and morbidity.  The differential diagnosis includes see above   Co morbidities that complicate the patient evaluation  Diabetes, neuropathy, hypertension and high cholesterol   Additional history obtained:  Additional history obtained from outside records, reviewed notes from outside hospital which showed that the patient did have kidney stones but no ureterolithiasis, possible urinary tract infection though seemed equivocal External records from outside source obtained and reviewed including as above   Lab Tests:  I Ordered, and personally interpreted labs.  The pertinent results include: Lactic acid of 1.6, metabolic panel showing a creatinine of 1.1, baseline is 1, CBC without leukocytosis, urinalysis showed positive nitrites with 21-50 white blood cells in the presence of bacteria.  COVID and flu test were negative and the chest x-ray was negative as well.   Imaging Studies ordered:  I ordered imaging studies including chest x-ray I independently visualized and interpreted imaging which showed negative for acute findings I agree with the radiologist interpretation   Cardiac Monitoring: / EKG:  The patient was maintained on a cardiac monitor.  I personally viewed and interpreted the cardiac monitored which showed an underlying rhythm of: Normal sinus rhythm, no hypotension   Consultations Obtained:  I requested consultation with the hospitalist,  and discussed lab and imaging findings as well as pertinent plan - they recommend: They agree to admission to the hospital Discussed with  pharmacy, they recommended IV antibiotics as the patient has multiple allergies to the usual antibiotics for urine infections   Problem List / ED Course / Critical interventions / Medication management  The  patient had abnormalities including urinary tract infection and given the flank pain the suspicion was that this was pyelonephritis. I ordered medication including IV fluids antipyretics and antibiotics for infection Reevaluation of the patient after these medicines showed that the patient the patient slightly improved I have reviewed the patients home medicines and have made adjustments as needed   Social Determinants of Health:  Diabetes   Test / Admission - Considered:  Admit to hospital         Final Clinical Impression(s) / ED Diagnoses Final diagnoses:  Pyelonephritis    Rx / DC Orders   Noemi Chapel, MD 02/07/22 1219

## 2022-02-06 NOTE — ED Notes (Signed)
Confirmed with pharmacy that the ordered antibiotic fits in safely with the patients antibiotic allergies.

## 2022-02-06 NOTE — Assessment & Plan Note (Signed)
Continue Percocet and Xanax

## 2022-02-06 NOTE — Assessment & Plan Note (Signed)
- 

## 2022-02-06 NOTE — ED Triage Notes (Signed)
Pt states she has been feeling sob and week x 2 days; pt states she was diagnosed with kidney stones and put on antibiotics this week

## 2022-02-06 NOTE — ED Notes (Signed)
Pharmacist Alma Friendly at [place] confirmed that the abx daptomycin is safe and not in the same family of medications listed as allergies in the patients chart.

## 2022-02-06 NOTE — Assessment & Plan Note (Signed)
BMI 44.29 Lifestyle modification

## 2022-02-06 NOTE — Assessment & Plan Note (Addendum)
Oxygen saturation down to 86% on room air ABG 7.42/36/60/23 on RA Personally reviewed chest x-ray--points duration, but no infiltrates or edema Suspect component of OSA/OHS Stable on 2L

## 2022-02-06 NOTE — Assessment & Plan Note (Signed)
PDMP reviewed -Patient receives Percocet 10/325, #90, last refill 01/22/2022 -Patient received Xanax, 1 mg, #90, last refill 01/13/2022

## 2022-02-06 NOTE — Hospital Course (Addendum)
56 year old female with a history of COPD, hypertension, hypothyroidism, hyperlipidemia, fibromyalgia, opioid dependence presenting with fevers, chills, with associated nausea and vomiting.  The patient presented to [facility] on 02/04/2022 with similar symptoms.  She stated that all her symptomatology began on 02/03/2022.  During her visit to [facility], she had a CT of the abdomen and pelvis which showed with a 7 mm stone in the left renal pelvis with bilateral nonobstructive nephrolithiasis. The patient was discharged home from the ED with Macrobid, Robaxin.  She stated that her symptoms have persisted with continued dry heaves.  She endorses dysuria.  She has tried  Azo as an outpatient without much improvement.  She complains of abdominal discomfort and right flank pain.  She states that she has pain all over.  She states that she intermittently has some shortness of breath but denies any chest pain, headache, diarrhea, hematochezia, melena.  In the ED, the patient was febrile up to 103.1 F.  She was hemodynamically stable with oxygen saturation 88% on room air.  WBC 9.6, hemoglobin 1.2, platelets 264,000.  LFTs were unremarkable.  Sodium 137, potassium 3.0, bicarbonate 20, serum creatinine 1.12.  UA shows 21-50 WBC, + nitrite. CT of the abdomen and pelvis on 02/04/2022 at [facility] showed bilateral nonobstructive nephrolithiasis with a 7 mm stone in the left renal pelvis.  There is no hydronephrosis.  There is no bowel obstruction or bowel wall thickening.  The patient was started on daptomycin and aztreonam initially.  On 02/07/22, patient complains of worsen right flank pain with rigors.  Repeat CT renal stone protocol showed two 6 mm right ureteral calculi with hydronephrosis.  Urology, Dr. Rexene Alberts, was consulted and recommended transfer to [facility] long for ureteral stent placement. Notably, blood cultures resulted with gram-negative rods in 2/2 sets.  Aztreonam was continued, and daptomycin was discontinued.

## 2022-02-06 NOTE — Assessment & Plan Note (Addendum)
Source = pyelonephritis and bacteremia Started aztreonam and dapto initially Lactic acid 1.6 Chest ray without any infiltrates Blood cultures= GNR Follow urine culture

## 2022-02-06 NOTE — Progress Notes (Addendum)
Pharmacy Antibiotic Note  Molly Berry is a 56 y.o. female admitted on 02/06/2022 with UTI. Patient with numerous antibiotic allergies. Pharmacy has been consulted for aztreonam dosing.  Plan: Aztreonam 2000 mg IV every 8 hours. Daptomycin 650 mg IV every 24 hours. Monitor labs, c/s, and patient improvement.  Height: '5\' 4"'$  (162.6 cm) Weight: 117 kg (258 lb) IBW/kg (Calculated) : 54.7  Temp (24hrs), Avg:103.1 F (39.5 C), Min:103 F (39.4 C), Max:103.1 F (39.5 C)  Recent Labs  Lab 02/06/22 1253  WBC 9.6  CREATININE 1.12*  LATICACIDVEN 1.6    Estimated Creatinine Clearance: 70.5 mL/min (A) (by C-G formula based on SCr of 1.12 mg/dL (H)).    Allergies  Allergen Reactions   Bee Venom Shortness Of Breath, Swelling and Rash   Cephalexin Anaphylaxis   Vancomycin Anaphylaxis and Hives    Has taken for bronchitis under medical supervision on site with benadryl   Equetro [Carbamazepine]     Loss of balance, trembling, inability to think, resulting in ER visit shortly after start of course    Adhesive [Tape] Rash   Cefotaxime Rash   Ciprofloxacin Hives and Rash   Clindamycin Rash   Latex Rash   Penicillins Hives and Rash    Has patient had a PCN reaction causing immediate rash, facial/tongue/throat swelling, SOB or lightheadedness with hypotension: Unknown Has patient had a PCN reaction causing severe rash involving mucus membranes or skin necrosis: Unknown Has patient had a PCN reaction that required hospitalization: Unknown Has patient had a PCN reaction occurring within the last 10 years: No If all of the above answers are "NO", then may proceed with Cephalosporin use.   Sulfa Antibiotics Rash    Antimicrobials this admission: Aztreonam 10/5 >> Dapto 10/5 >>  Microbiology results: 10/5 BCx: pending 10/5 UCx: pending   Thank you for allowing pharmacy to be a part of this patient's care.  Ramond Craver 02/06/2022 3:02 PM

## 2022-02-06 NOTE — ED Notes (Signed)
Report called and pt was accepted by receiving nurse on 300. Receiving nurse on 300 reported NT would be down to transport pt

## 2022-02-06 NOTE — Sepsis Progress Note (Signed)
Sepsis protocol monitored by eLink 

## 2022-02-07 ENCOUNTER — Inpatient Hospital Stay (HOSPITAL_COMMUNITY): Payer: 59 | Admitting: Certified Registered Nurse Anesthetist

## 2022-02-07 ENCOUNTER — Inpatient Hospital Stay (HOSPITAL_COMMUNITY): Payer: 59

## 2022-02-07 ENCOUNTER — Observation Stay (HOSPITAL_COMMUNITY): Payer: 59

## 2022-02-07 ENCOUNTER — Encounter (HOSPITAL_COMMUNITY)
Admission: EM | Disposition: A | Discharge status: Home / Self Care | Payer: Self-pay | Source: Home / Self Care | Attending: Internal Medicine

## 2022-02-07 ENCOUNTER — Encounter (HOSPITAL_COMMUNITY): Payer: Self-pay | Admitting: Internal Medicine

## 2022-02-07 DIAGNOSIS — E876 Hypokalemia: Secondary | ICD-10-CM | POA: Diagnosis present

## 2022-02-07 DIAGNOSIS — F32A Depression, unspecified: Secondary | ICD-10-CM | POA: Diagnosis present

## 2022-02-07 DIAGNOSIS — J449 Chronic obstructive pulmonary disease, unspecified: Secondary | ICD-10-CM

## 2022-02-07 DIAGNOSIS — M797 Fibromyalgia: Secondary | ICD-10-CM | POA: Diagnosis present

## 2022-02-07 DIAGNOSIS — Z6841 Body Mass Index (BMI) 40.0 and over, adult: Secondary | ICD-10-CM | POA: Diagnosis not present

## 2022-02-07 DIAGNOSIS — J9601 Acute respiratory failure with hypoxia: Secondary | ICD-10-CM | POA: Diagnosis present

## 2022-02-07 DIAGNOSIS — N179 Acute kidney failure, unspecified: Secondary | ICD-10-CM | POA: Diagnosis present

## 2022-02-07 DIAGNOSIS — K120 Recurrent oral aphthae: Secondary | ICD-10-CM | POA: Diagnosis present

## 2022-02-07 DIAGNOSIS — A419 Sepsis, unspecified organism: Secondary | ICD-10-CM | POA: Diagnosis present

## 2022-02-07 DIAGNOSIS — R7989 Other specified abnormal findings of blood chemistry: Secondary | ICD-10-CM

## 2022-02-07 DIAGNOSIS — I10 Essential (primary) hypertension: Secondary | ICD-10-CM | POA: Diagnosis present

## 2022-02-07 DIAGNOSIS — E039 Hypothyroidism, unspecified: Secondary | ICD-10-CM | POA: Diagnosis present

## 2022-02-07 DIAGNOSIS — G4733 Obstructive sleep apnea (adult) (pediatric): Secondary | ICD-10-CM | POA: Diagnosis present

## 2022-02-07 DIAGNOSIS — N132 Hydronephrosis with renal and ureteral calculous obstruction: Secondary | ICD-10-CM | POA: Diagnosis not present

## 2022-02-07 DIAGNOSIS — E119 Type 2 diabetes mellitus without complications: Secondary | ICD-10-CM | POA: Diagnosis present

## 2022-02-07 DIAGNOSIS — B9620 Unspecified Escherichia coli [E. coli] as the cause of diseases classified elsewhere: Secondary | ICD-10-CM | POA: Diagnosis present

## 2022-02-07 DIAGNOSIS — D509 Iron deficiency anemia, unspecified: Secondary | ICD-10-CM | POA: Diagnosis present

## 2022-02-07 DIAGNOSIS — E785 Hyperlipidemia, unspecified: Secondary | ICD-10-CM | POA: Diagnosis present

## 2022-02-07 DIAGNOSIS — A4151 Sepsis due to Escherichia coli [E. coli]: Secondary | ICD-10-CM | POA: Diagnosis present

## 2022-02-07 DIAGNOSIS — Z87442 Personal history of urinary calculi: Secondary | ICD-10-CM | POA: Diagnosis not present

## 2022-02-07 DIAGNOSIS — Z8249 Family history of ischemic heart disease and other diseases of the circulatory system: Secondary | ICD-10-CM | POA: Diagnosis not present

## 2022-02-07 DIAGNOSIS — N136 Pyonephrosis: Secondary | ICD-10-CM | POA: Diagnosis present

## 2022-02-07 DIAGNOSIS — N10 Acute tubulo-interstitial nephritis: Secondary | ICD-10-CM

## 2022-02-07 DIAGNOSIS — Z1152 Encounter for screening for COVID-19: Secondary | ICD-10-CM | POA: Diagnosis not present

## 2022-02-07 DIAGNOSIS — A4150 Gram-negative sepsis, unspecified: Secondary | ICD-10-CM | POA: Diagnosis present

## 2022-02-07 DIAGNOSIS — D519 Vitamin B12 deficiency anemia, unspecified: Secondary | ICD-10-CM | POA: Diagnosis present

## 2022-02-07 DIAGNOSIS — G894 Chronic pain syndrome: Secondary | ICD-10-CM | POA: Diagnosis present

## 2022-02-07 DIAGNOSIS — B9689 Other specified bacterial agents as the cause of diseases classified elsewhere: Secondary | ICD-10-CM | POA: Diagnosis present

## 2022-02-07 DIAGNOSIS — R7881 Bacteremia: Secondary | ICD-10-CM

## 2022-02-07 HISTORY — PX: CYSTOSCOPY W/ URETERAL STENT PLACEMENT: SHX1429

## 2022-02-07 LAB — CBC
HCT: 34.8 % — ABNORMAL LOW (ref 36.0–46.0)
Hemoglobin: 10.4 g/dL — ABNORMAL LOW (ref 12.0–15.0)
MCH: 26.3 pg (ref 26.0–34.0)
MCHC: 29.9 g/dL — ABNORMAL LOW (ref 30.0–36.0)
MCV: 88.1 fL (ref 80.0–100.0)
Platelets: 194 10*3/uL (ref 150–400)
RBC: 3.95 MIL/uL (ref 3.87–5.11)
RDW: 14.4 % (ref 11.5–15.5)
WBC: 10.3 10*3/uL (ref 4.0–10.5)
nRBC: 0 % (ref 0.0–0.2)

## 2022-02-07 LAB — GLUCOSE, CAPILLARY
Glucose-Capillary: 66 mg/dL — ABNORMAL LOW (ref 70–99)
Glucose-Capillary: 99 mg/dL (ref 70–99)
Glucose-Capillary: 169 mg/dL — ABNORMAL HIGH (ref 70–99)
Glucose-Capillary: 105 mg/dL — ABNORMAL HIGH (ref 70–99)
Glucose-Capillary: 101 mg/dL — ABNORMAL HIGH (ref 70–99)
Glucose-Capillary: 153 mg/dL — ABNORMAL HIGH (ref 70–99)

## 2022-02-07 LAB — BLOOD GAS, ARTERIAL
Acid-base deficit: 0.8 mmol/L (ref 0.0–2.0)
Bicarbonate: 23.5 mmol/L (ref 20.0–28.0)
Drawn by: 23434
FIO2: 21 %
O2 Saturation: 93.5 %
Patient temperature: 36.5
pCO2 arterial: 36 mmHg (ref 32–48)
pH, Arterial: 7.42 (ref 7.35–7.45)
pO2, Arterial: 60 mmHg — ABNORMAL LOW (ref 83–108)

## 2022-02-07 LAB — BLOOD CULTURE ID PANEL (REFLEXED) - BCID2

## 2022-02-07 LAB — BASIC METABOLIC PANEL
Anion gap: 7 (ref 5–15)
BUN: 19 mg/dL (ref 6–20)
CO2: 26 mmol/L (ref 22–32)
Calcium: 7.7 mg/dL — ABNORMAL LOW (ref 8.9–10.3)
Chloride: 105 mmol/L (ref 98–111)
Creatinine, Ser: 1.6 mg/dL — ABNORMAL HIGH (ref 0.44–1.00)
GFR, Estimated: 38 mL/min — ABNORMAL LOW (ref >60)
Glucose, Bld: 101 mg/dL — ABNORMAL HIGH (ref 70–99)
Potassium: 3.6 mmol/L (ref 3.5–5.1)
Sodium: 138 mmol/L (ref 135–145)

## 2022-02-07 LAB — HEMOGLOBIN A1C
Hgb A1c MFr Bld: 6.4 % — ABNORMAL HIGH (ref 4.8–5.6)
Mean Plasma Glucose: 136.98 mg/dL

## 2022-02-07 LAB — CK: Total CK: 102 U/L (ref 38–234)

## 2022-02-07 LAB — D-DIMER, QUANTITATIVE: D-Dimer, Quant: 3.07 ug/mL-FEU — ABNORMAL HIGH (ref 0.00–0.50)

## 2022-02-07 SURGERY — CYSTOSCOPY, WITH RETROGRADE PYELOGRAM AND URETERAL STENT INSERTION
Anesthesia: General | Site: Ureter | Laterality: Bilateral

## 2022-02-07 MED ORDER — SUCCINYLCHOLINE CHLORIDE 200 MG/10ML IV SOSY
PREFILLED_SYRINGE | INTRAVENOUS | Status: DC | PRN
  Administered 2022-02-07: 200 mg via INTRAVENOUS

## 2022-02-07 MED ORDER — SUCCINYLCHOLINE CHLORIDE 200 MG/10ML IV SOSY
PREFILLED_SYRINGE | INTRAVENOUS | Status: AC
  Filled 2022-02-07: qty 10

## 2022-02-07 MED ORDER — ACETAMINOPHEN 325 MG PO TABS: 325.0000 mg | ORAL_TABLET | ORAL | Status: DC | PRN

## 2022-02-07 MED ORDER — VORTIOXETINE HBR 5 MG PO TABS
20.0000 mg | ORAL_TABLET | Freq: Every day | ORAL | Status: DC
  Administered 2022-02-07: 20 mg via ORAL
  Filled 2022-02-07 (×2): qty 4

## 2022-02-07 MED ORDER — DEXAMETHASONE SODIUM PHOSPHATE 10 MG/ML IJ SOLN
INTRAMUSCULAR | Status: DC | PRN
  Administered 2022-02-07: 10 mg via INTRAVENOUS

## 2022-02-07 MED ORDER — ONDANSETRON HCL 4 MG/2ML IJ SOLN
INTRAMUSCULAR | Status: AC
  Filled 2022-02-07: qty 2

## 2022-02-07 MED ORDER — ACETAMINOPHEN 500 MG PO TABS
1000.0000 mg | ORAL_TABLET | Freq: Four times a day (QID) | ORAL | Status: DC
  Administered 2022-02-07 – 2022-02-10 (×11): 1000 mg via ORAL
  Filled 2022-02-07 (×11): qty 2

## 2022-02-07 MED ORDER — LIDOCAINE 2% (20 MG/ML) 5 ML SYRINGE
INTRAMUSCULAR | Status: DC | PRN
  Administered 2022-02-07: 60 mg via INTRAVENOUS

## 2022-02-07 MED ORDER — OXYCODONE HCL 5 MG/5ML PO SOLN: 5.0000 mg | Freq: Once | ORAL | Status: DC | PRN

## 2022-02-07 MED ORDER — FENTANYL CITRATE (PF) 100 MCG/2ML IJ SOLN
INTRAMUSCULAR | Status: AC
  Filled 2022-02-07: qty 2

## 2022-02-07 MED ORDER — SCOPOLAMINE 1 MG/3DAYS TD PT72
MEDICATED_PATCH | TRANSDERMAL | Status: AC
  Filled 2022-02-07: qty 1

## 2022-02-07 MED ORDER — OXYCODONE HCL 5 MG PO TABS
10.0000 mg | ORAL_TABLET | Freq: Once | ORAL | Status: AC | PRN
  Administered 2022-02-07: 10 mg via ORAL

## 2022-02-07 MED ORDER — DEXAMETHASONE SODIUM PHOSPHATE 10 MG/ML IJ SOLN
INTRAMUSCULAR | Status: AC
  Filled 2022-02-07: qty 1

## 2022-02-07 MED ORDER — SCOPOLAMINE 1 MG/3DAYS TD PT72
MEDICATED_PATCH | TRANSDERMAL | Status: DC | PRN
  Administered 2022-02-07: 1 via TRANSDERMAL

## 2022-02-07 MED ORDER — OXYCODONE HCL 5 MG PO TABS: 5.0000 mg | ORAL_TABLET | Freq: Once | ORAL | Status: DC | PRN

## 2022-02-07 MED ORDER — FAMOTIDINE 20 MG PO TABS
20.0000 mg | ORAL_TABLET | Freq: Two times a day (BID) | ORAL | Status: DC
  Administered 2022-02-07 – 2022-02-10 (×7): 20 mg via ORAL
  Filled 2022-02-07 (×6): qty 1

## 2022-02-07 MED ORDER — BUPROPION HCL ER (SR) 150 MG PO TB12
150.0000 mg | ORAL_TABLET | Freq: Every day | ORAL | Status: AC
  Administered 2022-02-07 – 2022-02-08 (×2): 150 mg via ORAL
  Filled 2022-02-07 (×2): qty 1

## 2022-02-07 MED ORDER — PROPOFOL 10 MG/ML IV BOLUS
INTRAVENOUS | Status: DC | PRN
  Administered 2022-02-07: 150 mg via INTRAVENOUS

## 2022-02-07 MED ORDER — FENTANYL CITRATE PF 50 MCG/ML IJ SOSY
PREFILLED_SYRINGE | INTRAMUSCULAR | Status: AC
  Administered 2022-02-07: 25 ug via INTRAVENOUS
  Filled 2022-02-07: qty 1

## 2022-02-07 MED ORDER — MEPERIDINE HCL 50 MG/ML IJ SOLN: 6.2500 mg | INTRAMUSCULAR | Status: DC | PRN

## 2022-02-07 MED ORDER — LIDOCAINE HCL (PF) 2 % IJ SOLN
INTRAMUSCULAR | Status: AC
  Filled 2022-02-07: qty 5

## 2022-02-07 MED ORDER — ACETAMINOPHEN 160 MG/5ML PO SOLN: 325.0000 mg | ORAL | Status: DC | PRN

## 2022-02-07 MED ORDER — SODIUM CHLORIDE 0.9 % IR SOLN
Status: DC | PRN
  Administered 2022-02-07: 3000 mL

## 2022-02-07 MED ORDER — OXYCODONE HCL 5 MG PO TABS
ORAL_TABLET | ORAL | Status: AC
  Filled 2022-02-07: qty 2

## 2022-02-07 MED ORDER — ONDANSETRON HCL 4 MG/2ML IJ SOLN: 4.0000 mg | Freq: Once | INTRAMUSCULAR | Status: DC | PRN

## 2022-02-07 MED ORDER — ALUM & MAG HYDROXIDE-SIMETH 200-200-20 MG/5ML PO SUSP
30.0000 mL | Freq: Four times a day (QID) | ORAL | Status: DC | PRN
  Administered 2022-02-07: 30 mL via ORAL
  Filled 2022-02-07: qty 30

## 2022-02-07 MED ORDER — METHOCARBAMOL 500 MG PO TABS
500.0000 mg | ORAL_TABLET | Freq: Four times a day (QID) | ORAL | Status: DC | PRN
  Administered 2022-02-07 – 2022-02-09 (×4): 500 mg via ORAL
  Filled 2022-02-07 (×4): qty 1

## 2022-02-07 MED ORDER — OXYCODONE HCL 5 MG/5ML PO SOLN: 5.0000 mg | Freq: Once | ORAL | Status: AC | PRN

## 2022-02-07 MED ORDER — ONDANSETRON HCL 4 MG/2ML IJ SOLN
INTRAMUSCULAR | Status: DC | PRN
  Administered 2022-02-07: 4 mg via INTRAVENOUS

## 2022-02-07 MED ORDER — FENTANYL CITRATE (PF) 100 MCG/2ML IJ SOLN
INTRAMUSCULAR | Status: DC | PRN
  Administered 2022-02-07: 100 ug via INTRAVENOUS

## 2022-02-07 MED ORDER — OXYCODONE HCL 5 MG PO TABS
10.0000 mg | ORAL_TABLET | Freq: Four times a day (QID) | ORAL | Status: DC | PRN
  Administered 2022-02-07 – 2022-02-10 (×7): 10 mg via ORAL
  Filled 2022-02-07 (×7): qty 2

## 2022-02-07 MED ORDER — FENTANYL CITRATE PF 50 MCG/ML IJ SOSY
25.0000 ug | PREFILLED_SYRINGE | INTRAMUSCULAR | Status: DC | PRN
  Administered 2022-02-07: 25 ug via INTRAVENOUS

## 2022-02-07 MED ORDER — TECHNETIUM TO 99M ALBUMIN AGGREGATED
4.0000 | Freq: Once | INTRAVENOUS | Status: AC | PRN
  Administered 2022-02-07: 4.4 via INTRAVENOUS

## 2022-02-07 MED ORDER — IOHEXOL 300 MG/ML  SOLN
INTRAMUSCULAR | Status: DC | PRN
  Administered 2022-02-07: 50 mL

## 2022-02-07 SURGICAL SUPPLY — 17 items
BAG DRN RND TRDRP ANRFLXCHMBR (UROLOGICAL SUPPLIES) ×1
BAG URINE DRAIN 2000ML AR STRL (UROLOGICAL SUPPLIES) IMPLANT
BAG URO CATCHER STRL LF (MISCELLANEOUS) ×1 IMPLANT
CATH SILICONE 16FRX5CC (CATHETERS) IMPLANT
CATH URETL OPEN 5X70 (CATHETERS) IMPLANT
CLOTH BEACON ORANGE TIMEOUT ST (SAFETY) ×1 IMPLANT
GLOVE BIOGEL M 7.0 STRL (GLOVE) ×1 IMPLANT
GOWN STRL REUS W/ TWL XL LVL3 (GOWN DISPOSABLE) ×1 IMPLANT
GOWN STRL REUS W/TWL XL LVL3 (GOWN DISPOSABLE) ×1
GUIDEWIRE STR DUAL SENSOR (WIRE) ×1 IMPLANT
GUIDEWIRE ZIPWRE .038 STRAIGHT (WIRE) IMPLANT
MANIFOLD NEPTUNE II (INSTRUMENTS) ×1 IMPLANT
PACK CYSTO (CUSTOM PROCEDURE TRAY) ×1 IMPLANT
STENT URET 6FRX26 CONTOUR (STENTS) IMPLANT
SYR 10ML LL (SYRINGE) ×1 IMPLANT
TUBING CONNECTING 10 (TUBING) ×1 IMPLANT
TUBING UROLOGY SET (TUBING) IMPLANT

## 2022-02-07 NOTE — Assessment & Plan Note (Signed)
10/5 blood cultures= GNR 2/2 sets Source = pyelonephritis

## 2022-02-07 NOTE — Op Note (Signed)
Operative Note  Preoperative diagnosis:  1.  Right ureteral stone with obstruction 2. Left renal pelvis stone  Postoperative diagnosis: 1.  Same  Procedure(s): 1.  Cystoscopy 2. Bilateral retrograde pyelogram with interpretation 3. Bilateral ureteral stent placement 4. Fluoroscopy <1 hour with intraoperative interpretation  Surgeon: Rexene Alberts, MD  Assistants:  None  Anesthesia:  General  Complications:  None  EBL:  Minimal  Specimens: 1.  ID Type Source Tests Collected by Time Destination  A : RIGHT RENAL PELVIS URINE Urine Urine, Cystoscope URINE CULTURE Janith Lima, MD 02/07/2022 1533    Drains/Catheters: 1.  Right 6Fr x 26cm ureteral stent 2. Left 6Fr x 26cm ureteral stent  Intraoperative findings:   Cystoscopy demonstrated no suspicious lesions, masses, stones or other pathology. Right retrograde pyelogram demonstrated severe right hydronephrosis. Left retrograde pyelogram demonstrated mild left hydronephrosis. Successful bilateral ureteral stent placement with curl in the renal pelvis and bladder respectively. This is a Indication:  Molly Berry is a 56 y.o. female with history of urolithiasis who presented 2 days ago with complaints of fevers, chills, nausea and emesis.  CT at that time did not demonstrate any evidence obstructing stone.  She represented in CT A/P 02/07/2022 with 2 separate 6 mm right ureteral stones causing moderate right-sided hydronephrosis and hydroureter.  She was found to be bacteremic.  She is also found to have a 6 mm stone in the extrarenal pelvis on the left.  After reviewing the management options for treatment, she elected to proceed with the above surgical procedure(s). We have discussed the potential benefits and risks of the procedure, side effects of the proposed treatment, the likelihood of the patient achieving the goals of the procedure, and any potential problems that might occur during the procedure or recuperation. Informed  consent has been obtained.  Description of procedure: The patient was taken to the operating room and general anesthesia was induced.  The patient was placed in the dorsal lithotomy position, prepped and draped in the usual sterile fashion, and preoperative antibiotics were administered. A preoperative time-out was performed.   Cystourethroscopy was performed.  The patient's urethra was examined and was normal. The bladder was then systematically examined in its entirety. There was no evidence for any bladder tumors, stones, or other mucosal pathology.    Attention then turned to the right ureteral orifice. A 0.038 zip wire was passed through the right orifice and over the wire a 5 Fr open ended catheter was inserted and passed up to the level of the renal pelvis. Aspirate was obtained and sent off as right renal pelvis urine for culture. This appeared grossly purulent. Omnipaque contrast was injected through the ureteral catheter and a retrograde pyelogram was performed with findings as dictated above. The wire was then replaced and the open ended catheter was removed.   A 6Fr x 26cm ureteral stent was advance over the wire. The stent was positioned appropriately under fluoroscopic and cystoscopic guidance.  The wire was then removed with an adequate stent curl noted in the renal pelvis as well as in the bladder.  Attention then turned to the left ureteral orifice. A 0.038 zip wire was passed through the left orifice and over the wire a 5 Fr open ended catheter was inserted and passed up to the level of the renal pelvis.  Omnipaque contrast was injected through the ureteral catheter and a retrograde pyelogram was performed with findings as dictated above. The wire was then replaced and the open ended catheter was  removed.   A 6Fr x 26cm ureteral stent was advance over the wire. The stent was positioned appropriately under fluoroscopic and cystoscopic guidance.  The wire was then removed with an  adequate stent curl noted in the renal pelvis as well as in the bladder.  The bladder was then emptied, foley catheter placed and the procedure ended.  The patient appeared to tolerate the procedure well and without complications.  The patient was able to be awakened and transferred to the recovery unit in satisfactory condition.   Plan:  F/u renal pelvis urine culture. Continue broad spectrum antibiotics. Messaged to arrange for outpatient followup to treat her stones.  Matt R. [facility]  Pager: [phone]

## 2022-02-07 NOTE — Transfer of Care (Signed)
Immediate Anesthesia Transfer of Care Note  Patient: Molly Berry  Procedure(s) Performed: CYSTOSCOPY WITH RETROGRADE PYELOGRAM/RIGHT URETERAL STENT PLACEMENT WITH POSSIBLE LEFT (Bilateral: Ureter)  Patient Location: PACU  Anesthesia Type:General  Level of Consciousness: sedated, patient cooperative and responds to stimulation  Airway & Oxygen Therapy: Patient Spontanous Breathing and Patient connected to face mask oxygen  Post-op Assessment: Report given to RN and Post -op Vital signs reviewed and stable  Post vital signs: Reviewed and stable  Last Vitals:  Vitals Value Taken Time  BP 135/81 02/07/22 1557  Temp    Pulse 64 02/07/22 1559  Resp 15 02/07/22 1559  SpO2 96 % 02/07/22 1559  Vitals shown include unvalidated device data.  Last Pain:  Vitals:   02/07/22 1457  TempSrc: Oral  PainSc: 0-No pain         Complications: No notable events documented.

## 2022-02-07 NOTE — Anesthesia Procedure Notes (Signed)
Procedure Name: Intubation Date/Time: 02/07/2022 3:25 PM  Performed by: Sharlette Dense, CRNAPre-anesthesia Checklist: Patient identified, Emergency Drugs available, Suction available and Patient being monitored Patient Re-evaluated:Patient Re-evaluated prior to induction Oxygen Delivery Method: Circle system utilized Preoxygenation: Pre-oxygenation with 100% oxygen Induction Type: IV induction and Cricoid Pressure applied Ventilation: Mask ventilation without difficulty Laryngoscope Size: Miller and 2 Grade View: Grade I Tube type: Oral Tube size: 7.0 mm Number of attempts: 1 Airway Equipment and Method: Stylet and Oral airway Placement Confirmation: ETT inserted through vocal cords under direct vision, positive ETCO2 and breath sounds checked- equal and bilateral Secured at: 22 cm Tube secured with: Tape Dental Injury: Teeth and Oropharynx as per pre-operative assessment

## 2022-02-07 NOTE — Anesthesia Postprocedure Evaluation (Signed)
Anesthesia Post Note  Patient: KANETRA HO  Procedure(s) Performed: CYSTOSCOPY WITH RETROGRADE PYELOGRAM/RIGHT URETERAL STENT PLACEMENT WITH POSSIBLE LEFT (Bilateral: Ureter)     Patient location during evaluation: PACU Anesthesia Type: General Level of consciousness: awake and alert Pain management: pain level controlled Vital Signs Assessment: post-procedure vital signs reviewed and stable Respiratory status: spontaneous breathing, nonlabored ventilation, respiratory function stable and patient connected to nasal cannula oxygen Cardiovascular status: blood pressure returned to baseline and stable Postop Assessment: no apparent nausea or vomiting Anesthetic complications: no   No notable events documented.  Last Vitals:  Vitals:   02/07/22 1704 02/07/22 1727  BP: 134/77 123/75  Pulse: 61 61  Resp: 15 20  Temp: 36.8 C 36.8 C  SpO2: 97% 97%    Last Pain:  Vitals:   02/07/22 1736  TempSrc:   PainSc: 4                  Alantra Popoca

## 2022-02-07 NOTE — Progress Notes (Signed)
PROGRESS NOTE  Molly Berry OZH:[id number] DOB: Mar 23, 1966 DOA: 02/06/2022 PCP: Nicholes Rough, PA-C  Brief History:  56 year old female with a history of COPD, hypertension, hypothyroidism, hyperlipidemia, fibromyalgia, opioid dependence presenting with fevers, chills, with associated nausea and vomiting.  The patient presented to [facility] on 02/04/2022 with similar symptoms.  She stated that all her symptomatology began on 02/03/2022.  During her visit to [facility], she had a CT of the abdomen and pelvis which showed with a 7 mm stone in the left renal pelvis with bilateral nonobstructive nephrolithiasis. The patient was discharged home from the ED with Macrobid, Robaxin.  She stated that her symptoms have persisted with continued dry heaves.  She endorses dysuria.  She has tried  Azo as an outpatient without much improvement.  She complains of abdominal discomfort and right flank pain.  She states that she has pain all over.  She states that she intermittently has some shortness of breath but denies any chest pain, headache, diarrhea, hematochezia, melena.  In the ED, the patient was febrile up to 103.1 F.  She was hemodynamically stable with oxygen saturation 88% on room air.  WBC 9.6, hemoglobin 1.2, platelets 264,000.  LFTs were unremarkable.  Sodium 137, potassium 3.0, bicarbonate 20, serum creatinine 1.12.  UA shows 21-50 WBC, + nitrite. CT of the abdomen and pelvis on 02/04/2022 at [facility] showed bilateral nonobstructive nephrolithiasis with a 7 mm stone in the left renal pelvis.  There is no hydronephrosis.  There is no bowel obstruction or bowel wall thickening.  The patient was started on daptomycin and aztreonam initially.  On 02/07/22, patient complains of worsen right flank pain with rigors.  Repeat CT renal stone protocol showed two 6 mm right ureteral calculi with hydronephrosis.  Urology, Dr. Rexene Alberts, was consulted and recommended transfer [facility]teral [facility]  placement. Notably, blood cultures resulted with gram-negative rods in 2/2 sets.  Aztreonam was continued, and daptomycin was discontinued.     Assessment and Plan: * Sepsis due to undetermined organism [facility]) Source = pyelonephritis and bacteremia Started aztreonam and dapto initially Lactic acid 1.6 Chest ray without any infiltrates Blood cultures= GNR Follow urine culture  Gram-negative bacteremia 10/5 blood cultures= GNR 2/2 sets Source = pyelonephritis  Acute pyelonephritis With obstructive uropathy with right ureteral stone x 2 with hydronephrosis -follow urine culture -continue aztreonam -urology consulted, discussed with Dr. Randon Goldsmith to [facility] for ureteral stent  AKI (acute kidney injury) [facility]) Due to obstructive uropathy and hemodynamic changes Baseline creatinine 0.9-1.1 Serum creatinine peaked 1.50  Elevated d-dimer VQ scan-llow probability PE  Opioid dependence ([place]) PDMP reviewed -Patient receives Percocet 10/325, #90, last refill 01/22/2022 -Patient received Xanax, 1 mg, #90, last refill 01/13/2022  Hypoxia Oxygen saturation down to 86% on room air ABG 7.42/36/60/23 on RA Personally reviewed chest x-ray--points duration, but no infiltrates or edema Suspect component of OSA/OHS Stable on 2L  Obesity, Class III, BMI 40-49.9 (morbid obesity) (HCC) BMI 44.29 Lifestyle modification  Fibromyalgia syndrome Continue Percocet and Xanax  Essential hypertension, benign Continue metoprolol succinate  Controlled type 2 diabetes mellitus without complication, without long-term current use of insulin (HCC) Check hemoglobin A1c--6.4 Holding sitagliptin NovoLog sliding scale     Family Communication:   spouse at bedside 10/6  Consultants:  urology--Dr. Abner Greenspan  Code Status:  FULL  DVT Prophylaxis:  [place] Lovenox   Procedures: As Listed in Progress Note Above  Antibiotics: Aztreonam 10/5>> Daptomycin 10/5 x  1      Subjective: She  complains of right flank pain.  Has nausea without emesis.  Denies cp, sob, abd pain, diarrhea.  Objective: Vitals:   02/06/22 2030 02/07/22 0102 02/07/22 0403 02/07/22 1009  BP: 129/70 (!) 158/83 (!) 150/80 118/73  Pulse: 69 (!) 58 64 76  Resp: '20 19 20   '$ Temp: 99.3 F (37.4 C) 98.4 F (36.9 C) 97.6 F (36.4 C)   TempSrc: Oral Oral    SpO2: 92% 94% 96%   Weight:      Height:        Intake/Output Summary (Last 24 hours) at 02/07/2022 1252 Last data filed at 02/07/2022 0308 Gross per 24 hour  Intake 3992.39 ml  Output --  Net 3992.39 ml   Weight change:  Exam:  General:  Pt is alert, follows commands appropriately, not in acute distress HEENT: No icterus, No thrush, No neck mass, [place]/AT Cardiovascular: RRR, S1/S2, no rubs, no gallops Respiratory: fine bibasilar crackles. No wheeze Abdomen: Soft/+BS, R-flank tender, non distended, no guarding Extremities: No edema, No lymphangitis, No petechiae, No rashes, no synovitis   Data Reviewed: I have personally reviewed following labs and imaging studies Basic Metabolic Panel: Recent Labs  Lab 02/06/22 1253 02/07/22 0615  NA 137 138  K 3.0* 3.6  CL 104 105  CO2 23 26  GLUCOSE 135* 101*  BUN 14 19  CREATININE 1.12* 1.60*  CALCIUM 8.4* 7.7*   Liver Function Tests: Recent Labs  Lab 02/06/22 1253  AST 18  ALT 15  ALKPHOS 98  BILITOT 0.7  PROT 7.5  ALBUMIN 3.9   No results for input(s): "LIPASE", "AMYLASE" in the last 168 hours. No results for input(s): "AMMONIA" in the last 168 hours. Coagulation Profile: Recent Labs  Lab 02/06/22 1237  INR 1.1   CBC: Recent Labs  Lab 02/06/22 1253 02/07/22 0615  WBC 9.6 10.3  NEUTROABS 7.9*  --   HGB 11.2* 10.4*  HCT 37.4 34.8*  MCV 88.2 88.1  PLT 264 194   Cardiac Enzymes: Recent Labs  Lab 02/07/22 0615  CKTOTAL 102   BNP: Invalid input(s): "POCBNP" CBG: Recent Labs  Lab 02/06/22 2033 02/07/22 0747 02/07/22 0851 02/07/22 1156  GLUCAP 180* 66* 99 169*    HbA1C: Recent Labs    02/07/22 0615  HGBA1C 6.4*   Urine analysis:    Component Value Date/Time   COLORURINE AMBER (A) 02/06/2022 1211   APPEARANCEUR CLEAR 02/06/2022 1211   LABSPEC 1.015 02/06/2022 1211   PHURINE 5.0 02/06/2022 1211   GLUCOSEU NEGATIVE 02/06/2022 1211   HGBUR MODERATE (A) 02/06/2022 1211   BILIRUBINUR NEGATIVE 02/06/2022 1211   BILIRUBINUR negative 08/06/2021 [place] 02/06/2022 1211   PROTEINUR 30 (A) 02/06/2022 1211   UROBILINOGEN 1.0 08/06/2021 0849   UROBILINOGEN 1.0 01/16/2015 0149   NITRITE POSITIVE (A) 02/06/2022 1211   LEUKOCYTESUR MODERATE (A) 02/06/2022 1211   Sepsis Labs: '@LABRCNTIP'$ (procalcitonin:4,lacticidven:4) ) Recent Results (from the past 240 hour(s))  Resp Panel by RT-PCR (Flu A&B, Covid) Anterior Nasal Swab     Status: None   Collection Time: 02/06/22 12:37 PM   Specimen: Anterior Nasal Swab  Result Value Ref Range Status   SARS Coronavirus 2 by RT PCR NEGATIVE NEGATIVE Final    Comment: (NOTE) SARS-CoV-2 target nucleic acids are NOT DETECTED.  The SARS-CoV-2 RNA is generally detectable in upper respiratory specimens during the acute phase of infection. The lowest concentration of SARS-CoV-2 viral copies this assay can detect is 138  copies/mL. A negative result does not preclude SARS-Cov-2 infection and should not be used as the sole basis for treatment or other patient management decisions. A negative result may occur with  improper specimen collection/handling, submission of specimen other than nasopharyngeal swab, presence of viral mutation(s) within the areas targeted by this assay, and inadequate number of viral copies(<138 copies/mL). A negative result must be combined with clinical observations, patient history, and epidemiological information. The expected result is Negative.  Fact Sheet for Patients:  EntrepreneurPulse.com.au  Fact Sheet for Healthcare Providers:   IncredibleEmployment.be  This test is no t yet approved or cleared by the [place] FDA and  has been authorized for detection and/or diagnosis of SARS-CoV-2 by FDA under an Emergency Use Authorization (EUA). This EUA will remain  in effect (meaning this test can be used) for the duration of the COVID-19 declaration under Section 564(b)(1) of the Act, 21 U.S.C.section 360bbb-3(b)(1), unless the authorization is terminated  or revoked sooner.       Influenza A by PCR NEGATIVE NEGATIVE Final   Influenza B by PCR NEGATIVE NEGATIVE Final    Comment: (NOTE) The Xpert Xpress SARS-CoV-2/FLU/RSV plus assay is intended as an aid in the diagnosis of influenza from Nasopharyngeal swab specimens and should not be used as a sole basis for treatment. Nasal washings and aspirates are unacceptable for Xpert Xpress SARS-CoV-2/FLU/RSV testing.  Fact Sheet for Patients: EntrepreneurPulse.com.au  Fact Sheet for Healthcare Providers: IncredibleEmployment.be  This test is not yet approved or cleared by the [place] FDA and has been authorized for detection and/or diagnosis of SARS-CoV-2 by FDA under an Emergency Use Authorization (EUA). This EUA will remain in effect (meaning this test can be used) for the duration of the COVID-19 declaration under Section 564(b)(1) of the Act, 21 U.S.C. section 360bbb-3(b)(1), unless the authorization is terminated or revoked.  Performed at [facility], [address], [place], [place] [address]   Blood Culture (routine x 2)     Status: None (Preliminary result)   Collection Time: 02/06/22 12:42 PM   Specimen: Right Antecubital; Blood  Result Value Ref Range Status   Specimen Description RIGHT ANTECUBITAL  Final   Special Requests   Final    BOTTLES DRAWN AEROBIC AND ANAEROBIC Blood Culture results may not be optimal due to an excessive volume of blood received in culture bottles   Culture  Setup  Time   Final    GRAM NEGATIVE RODS ANAEROBIC BOTTLE ONLY Gram Stain Report Called to,Read Back By and Verified With: JACKSON N @ [id number] ON [id number] BY HENDERSON L GRAM NEGATIVE RODS AEROBIC BOTTLE ONLY RESULT PREVIOUSLY CALLED    Culture   Final    NO GROWTH < 24 HOURS Performed at [facility], [address] [place], [place] [address]    Report Status PENDING  Incomplete  Blood Culture (routine x 2)     Status: None (Preliminary result)   Collection Time: 02/06/22 12:53 PM   Specimen: Right Antecubital; Blood  Result Value Ref Range Status   Specimen Description   Final    RIGHT ANTECUBITAL Performed at [facility], [address], [place], [place] [address]    Special Requests   Final    BOTTLES DRAWN AEROBIC AND ANAEROBIC Blood Culture results may not be optimal due to an excessive volume of blood received in culture bottles Performed at [facility], [address], [place], [place] [address]    Culture  Setup Time   Final    Lonell Grandchild  NEGATIVE RODS IN BOTH AEROBIC AND ANAEROBIC BOTTLES Gram Stain Report Called to,Read Back By and Verified With: JACKSON @ [phone] ON [id number] BY HENDERSON L Organism ID to follow Performed at [facility], [place] [address] [place], [place] [address]    Culture   Final    NO GROWTH < 24 HOURS Performed at [facility], [address] [place], [place] [address]    Report Status PENDING  Incomplete     Scheduled Meds:  acetaminophen  1,000 mg Oral Q6H   enoxaparin (LOVENOX) injection  60 mg Subcutaneous Q24H   famotidine  20 mg Oral BID   gabapentin  1,200 mg Oral BID   gabapentin  600 mg Oral Q1400   ibuprofen  800 mg Oral Once   influenza vac split quadrivalent PF  0.5 mL Intramuscular Tomorrow-1000   insulin aspart  0-9 Units Subcutaneous TID WC   lamoTRIgine  200 mg Oral BID   metoprolol succinate  25 mg Oral Daily   ondansetron  4 mg Intravenous Once   Continuous Infusions:  aztreonam 2 g (02/07/22 0629)    Procedures/Studies: NM  Pulmonary Perfusion  Result Date: 02/07/2022 CLINICAL DATA:  Shortness of breath, positive D-dimer EXAM: NUCLEAR MEDICINE PERFUSION LUNG SCAN TECHNIQUE: Perfusion images were obtained in multiple projections after intravenous injection of radiopharmaceutical. Ventilation scans intentionally deferred if perfusion scan and chest x-ray adequate for interpretation during COVID 19 epidemic. RADIOPHARMACEUTICALS:  4.4 mCi Tc-28mMAA IV COMPARISON:  Chest radiograph done on 02/06/2022 FINDINGS: There are no wedge-shaped perfusion defects. There is ill-defined area of decreased uptake in posterior left upper lung field. IMPRESSION: Low probability for pulmonary embolism. Electronically Signed   By: PElmer PickerM.D.   On: 02/07/2022 11:51   CT RENAL STONE STUDY  Result Date: 02/07/2022 CLINICAL DATA:  Right flank pain. EXAM: CT ABDOMEN AND PELVIS WITHOUT CONTRAST TECHNIQUE: Multidetector CT imaging of the abdomen and pelvis was performed following the standard protocol without IV contrast. RADIATION DOSE REDUCTION: This exam was performed according to the departmental dose-optimization program which includes automated exposure control, adjustment of the mA and/or kV according to patient size and/or use of iterative reconstruction technique. COMPARISON:  02/04/2022 FINDINGS: Lower chest: Bibasilar scarring and atelectasis. No pleural effusions. The heart is normal in size. No pericardial effusion. Hepatobiliary: No hepatic lesions or intrahepatic biliary dilatation. The gallbladder is surgically absent. No common bile duct dilatation. Pancreas: No mass, inflammation ductal dilatation. Spleen: Normal size.  No focal lesions. Adrenals/Urinary Tract: The adrenal glands are normal. New moderate right-sided hydronephrosis and hydroureter. There are 2 ureteral calculi. The more proximal calculus is at the L5 level and measures a maximum of or 6 mm. The more distal calculus is located at the mid sacral level and also  measures 6 mm. Bilateral renal calculi. There is a 6 mm calculus in the left extra renal pelvis but no obstructing left ureteral calculi. No bladder calculi. No worrisome renal or bladder lesions are identified without contrast. 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 The duodenum, small bowel and colon unremarkable. Vascular/Lymphatic: Stable scattered atherosclerotic calcifications involving the aorta and iliac arteries but no aneurysm. No mesenteric or retroperitoneal lymphadenopathy. A retroaortic left renal vein is noted incidentally. Reproductive: Surgically absent. Other: Small amount of perinephric fluid on the right side also tracking down along the right ureter and into the pelvis. Musculoskeletal: No significant bony findings. IMPRESSION: 1. Two new 6 mm right ureteral calculi causing moderate right-sided hydronephrosis and  hydroureter. 2. Bilateral renal calculi. 3. 6 mm calculus in the left extra renal pelvis but no obstructing left ureteral calculi. 4. 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 5. Status post cholecystectomy. No biliary dilatation. 6. Aortic atherosclerosis. Aortic Atherosclerosis (ICD10-I70.0). Electronically Signed   By: Marijo Sanes M.D.   On: 02/07/2022 10:13   DG Chest 2 View  Result Date: 02/06/2022 CLINICAL DATA:  Short of breath for just over 1 week. Diagnosed with kidney stones and put on antibiotics this week. EXAM: CHEST - 2 VIEW COMPARISON:  07/17/2021.  CT, 03/29/2009. FINDINGS: Mild enlargement of cardiac silhouette. No mediastinal or hilar masses. No evidence of adenopathy Clear lungs.  No pleural effusion or pneumothorax. Skeletal structures are intact. IMPRESSION: No active cardiopulmonary disease. Electronically Signed   By: Lajean Manes M.D.   On: 02/06/2022 12:40    Orson Eva, DO  [facility]  If 7PM-7AM, please contact night-coverage [email or web address] Password  [email or web address] 02/07/2022, 12:52 PM   LOS: 0 days

## 2022-02-07 NOTE — Progress Notes (Signed)
Patient being transported from [facility] to [facility] for procedure, transported by [employer].

## 2022-02-07 NOTE — Progress Notes (Signed)
Hypoglycemic Event  CBG: 66 mg/dL   Treatment: 4 oz juice/soda  Symptoms: Shaky  Follow-up CBG: Time:0851 CBG Result:99 mg/dL  Possible Reasons for Event: Inadequate meal intake  Comments/MD notified: MD Tat made aware.     Ginger Organ

## 2022-02-07 NOTE — Assessment & Plan Note (Signed)
VQ scan-llow probability PE

## 2022-02-07 NOTE — Anesthesia Preprocedure Evaluation (Addendum)
Anesthesia Evaluation  Patient identified by MRN, date of birth, ID band Patient awake    Reviewed: Allergy & Precautions, NPO status , Patient's Chart, lab work & pertinent test results  History of Anesthesia Complications (+) PONV and history of anesthetic complications  Airway Mallampati: II  TM Distance: >3 FB Neck ROM: Full    Dental no notable dental hx. (+) Edentulous Upper, Edentulous Lower   Pulmonary asthma , COPD,  COPD inhaler,  Multiple pulmonary nodules 11/30/2012   Followed in Pulmonary clinic/ [facility]/ [place]  - See CT abd  10/29/12  New right lower lobe pulmonary nodularity, primarily ground-  glass in density. This could reflect an inflammatory process,  although follow-up is necessary to exclude atypical neoplasm.      Pulmonary exam normal breath sounds clear to auscultation       Cardiovascular hypertension, Pt. on medications and Pt. on home beta blockers Normal cardiovascular exam Rhythm:Regular Rate:Normal     Neuro/Psych negative neurological ROS  negative psych ROS   GI/Hepatic negative GI ROS, Neg liver ROS,   Endo/Other  diabetes, Type 2Hypothyroidism Morbid obesity  Renal/GU Renal disease  negative genitourinary   Musculoskeletal  (+) Arthritis , Osteoarthritis,  Fibromyalgia -  Abdominal   Peds negative pediatric ROS (+)  Hematology  (+) Blood dyscrasia, anemia ,   Anesthesia Other Findings   Reproductive/Obstetrics negative OB ROS                            Anesthesia Physical  Anesthesia Plan  ASA: 3 and emergent  Anesthesia Plan: General   Post-op Pain Management: Ofirmev IV (intra-op)* and Toradol IV (intra-op)*   Induction: Intravenous and Cricoid pressure planned  PONV Risk Score and Plan: 3 and Ondansetron, Treatment may vary due to age or medical condition and Dexamethasone  Airway Management Planned: Oral ETT  Additional  Equipment: None  Intra-op Plan:   Post-operative Plan: Extubation in OR  Informed Consent: I have reviewed the patients History and Physical, chart, labs and discussed the procedure including the risks, benefits and alternatives for the proposed anesthesia with the patient or authorized representative who has indicated his/her understanding and acceptance.     Dental advisory given  Plan Discussed with: CRNA and Anesthesiologist  Anesthesia Plan Comments: (HPI: Molly Berry is a 56 year old female with a history of COPD, hypertension, hypothyroidism, hyperlipidemia, fibromyalgia, opioid dependence presenting with fevers, chills, with associated nausea and vomiting.  The patient presented to [facility] on 02/04/2022 with similar symptoms.  She stated that all her symptomatology began on 02/03/2022.  The patient was discharged home from the ED with Macrobid, Robaxin.  She stated that her symptoms have persisted with continued dry heaves.  She endorses dysuria.  She has tried  Azo as an outpatient without much improvement.  She complains of abdominal discomfort and right flank pain.  She states that she has pain all over.  She states that she intermittently has some shortness of breath but denies any chest pain, headache, diarrhea, hematochezia, melena.  In the ED, the patient was febrile up to 103.1 F.  She was hemodynamically stable with oxygen saturation 88% on room air.  WBC 9.6, hemoglobin 1.2, platelets 264,000.  LFTs were unremarkable.  Sodium 137, potassium 3.0, bicarbonate 20, serum creatinine 1.12.  UA shows 21-50 WBC, + nitrite. CT of the abdomen and pelvis on 02/04/2022 at [facility] showed bilateral nonobstructive nephrolithiasis with a 7 mm stone in the left  renal pelvis.  There is no hydronephrosis.  There is no bowel obstruction or bowel wall thickening.  The patient was started on daptomycin and aztreonam.   )        Anesthesia Quick Evaluation

## 2022-02-07 NOTE — Assessment & Plan Note (Signed)
Due to obstructive uropathy and hemodynamic changes Baseline creatinine 0.9-1.1 Serum creatinine peaked 1.50

## 2022-02-07 NOTE — Progress Notes (Signed)
PHARMACY - PHYSICIAN COMMUNICATION CRITICAL VALUE ALERT - BLOOD CULTURE IDENTIFICATION (BCID)  Molly Berry is an 56 y.o. female who presented to [place] on 02/06/2022   Assessment:  BCID e. Coli no resistance   Current antibiotics: Aztreonam 2000 mg IV every 8 hours due to multiple allergies  Changes to prescribed antibiotics recommended:  Patient is on recommended antibiotics - No changes needed  No results found for this or any previous visit.  Ramond Craver 02/07/2022  1:54 PM

## 2022-02-07 NOTE — Consult Note (Signed)
Urology Consult   Physician requesting consult: Orson Eva, MD  Reason for consult: right ureteral stone, left renal pelvis stone, sepsis  History of Present Illness: Molly Berry is a 56 y.o.  with a past medical history of COPD, hypertension, hypothyroidism, hyperlipidemia, fibromyalgia, opioid dependence, history of urolithiasis.  She was last seen at [facility] in 2019.   She presented to [facility] on 02/04/2022 with complaints of fevers, chills, nausea and vomiting.  She was discharged home from the ED with Macrobid.  Her symptoms continued and she presented to [facility] earlier today with continued dysuria, fevers, chills, right-sided flank pain.  She was found to be febrile at 103.1.  WBC was 9.6.  She had positive nitrites. CT A/P 02/04/2022 at [facility] demonstrated bilateral nonobstructing stones with a 7 mm stone in the left renal pelvis.  At this time, there is no hydronephrosis. CT A/P 02/07/2022 with 2 separate 6 mm right ureteral stones causing moderate right-sided hydronephrosis and hydroureter.  She also had a 6 mm stone in the extrarenal pelvis on the left.  She complains of persistent right sided flank pain and intermittent left sided pain.     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 This could reflect an inflammatory process,  although follow-up is necessary to exclude atypical neoplasm. Full  chest CT should be considered to evaluate for other pulmonary  findings.      Multiple thyroid nodules    Nephropathy    Pneumonia    PONV (postoperative nausea and vomiting)    Pulmonary nodules    Thyroid disease    Ulcer    Uterine cancer ([place]) 12/26/2011   Stage 1, grade 1, S/P hysterectomy initially by robotic technique and then salpingo-oophorectomy at [facility] receiving no postoperative treatment.    Past Surgical History:  Procedure Laterality Date   ABDOMINAL HYSTERECTOMY     endometrial cancer   CHOLECYSTECTOMY     COLONOSCOPY WITH PROPOFOL N/A 01/19/2014   VCB:[id number]   DILATION AND CURETTAGE OF UTERUS  12 yrs ago   DILATION AND CURETTAGE OF UTERUS  06/18/2011   Procedure: DILATATION AND CURETTAGE;  Surgeon: Florian Buff, MD;  Location: [facility];  Service: Gynecology;  Laterality: N/A;  Suction Dilation and Curettage   ESOPHAGOGASTRODUODENOSCOPY N/A 10/12/2013   Dr. Rourk:anastomotic ulcer likely cause of bleeding. likely ischemic    ESOPHAGOGASTRODUODENOSCOPY (EGD) WITH PROPOFOL N/A 01/19/2014   GYK:[id number]   EXTRACORPOREAL SHOCK WAVE LITHOTRIPSY Left 01/11/2018   Procedure: LEFT EXTRACORPOREAL SHOCK WAVE LITHOTRIPSY (ESWL);  Surgeon:  Lucas Mallow, MD;  Location: [facility];  Service: Urology;  Laterality: Left;   EXTRACORPOREAL SHOCK WAVE LITHOTRIPSY Left 01/14/2018   Procedure: LEFT EXTRACORPOREAL SHOCK WAVE LITHOTRIPSY (ESWL);  Surgeon: Cleon Gustin, MD;  Location: [facility];  Service: Urology;  Laterality: Left;  77 MINS  W/ MAC   GASTRIC BYPASS  2013   [facility]   HYSTEROSCOPY WITH D & C  06/18/2011   Procedure: DILATATION AND CURETTAGE /HYSTEROSCOPY;  Surgeon: Florian Buff, MD;  Location: [facility];  Service: Gynecology;  Laterality: N/A;   LITHOTRIPSY     paniculectomy      SHOULDER ARTHROSCOPY WITH SUBACROMIAL DECOMPRESSION AND OPEN ROTATOR C Left 07/16/2021   Procedure: LEFT ARTHROSCOPIC SUBACROMIAL DECOMPRESSION, MINI OPEN ROTATOR CUFF TEAR REPAIR, BICEPS TENOTOMY;  Surgeon: [name], MD;  Location: [facility];  Service: Orthopedics;  Laterality: Left;   TRIGGER FINGER RELEASE     WISDOM TOOTH EXTRACTION       Current Hospital Medications:  Home meds:  No current facility-administered medications on file prior to encounter.   Current Outpatient Medications on File Prior to Encounter  Medication Sig Dispense Refill   albuterol (PROVENTIL HFA;VENTOLIN HFA) 108 (90 BASE) MCG/ACT inhaler Inhale 2 puffs into the lungs every 6 (six) hours as needed for shortness of breath or wheezing.     ALPRAZolam (XANAX) 1 MG tablet Take 1 mg by mouth 3 (three) times daily as needed for anxiety or sleep.     buPROPion (WELLBUTRIN SR) 150 MG 12 hr tablet Take 150 mg by mouth daily.  2   dicyclomine (BENTYL) 20 MG tablet Take 20 mg by mouth 3 (three) times daily as needed for spasms.     diphenhydrAMINE (BENADRYL) 25 mg capsule Take 25 mg by mouth at bedtime.     esomeprazole (NEXIUM) 40 MG capsule Take 40 mg by mouth 2 (two) times daily before a meal.     famotidine (PEPCID) 40 MG tablet Take 40 mg by mouth 2 (two) times daily.     furosemide (LASIX) 20 MG tablet Take 20 mg by mouth daily.     gabapentin (NEURONTIN) 600 MG tablet Take 600 mg by mouth See admin instructions. Take 1200 mg by mouth in the morning, and 1200 mg at night     lamoTRIgine (LAMICTAL) 200 MG tablet Take 200 mg by mouth 2 (two) times daily.     meloxicam (MOBIC) 15 MG tablet Take 1 tablet (15 mg total) by mouth daily. 30 tablet 0   methocarbamol (ROBAXIN) 750 MG tablet Take 750 mg by mouth 3 (three) times daily as needed for muscle spasms.     metoprolol succinate (TOPROL-XL) 50 MG 24 hr tablet Take 50 mg by mouth daily.     nitrofurantoin, macrocrystal-monohydrate, (MACROBID) 100 MG capsule Take  1 capsule (100 mg total) by mouth 2 (two) times daily. (Patient taking differently: Take 100 mg by mouth 2 (two) times daily. Start date 02/05/22 for 7 day supply.) 10 capsule 0   ondansetron (ZOFRAN) 8 MG tablet Take 8 mg by mouth every 8 (eight) hours as needed for nausea or vomiting.      oxyCODONE-acetaminophen (PERCOCET) 10-325 MG tablet Take 1 tablet by mouth every 4 (four) hours as needed for pain. 30 tablet 0   simvastatin (ZOCOR) 20 MG tablet Take 20 mg by mouth daily.   1   sitaGLIPtin (JANUVIA) 100 MG tablet Take 100 mg by mouth daily.     sucralfate (CARAFATE) 1 g  tablet Take 1 g by mouth 4 (four) times daily.     SUMAtriptan (IMITREX) 100 MG tablet Take 100 mg by mouth every 2 (two) hours as needed for migraine.     tiZANidine (ZANAFLEX) 4 MG tablet Take 4 mg by mouth at bedtime.     Vitamin D, Ergocalciferol, (DRISDOL) 1.25 MG (50000 UNIT) CAPS capsule Take 50,000 Units by mouth once a week.     vortioxetine HBr (TRINTELLIX) 20 MG TABS tablet Take 20 mg by mouth daily.     amphetamine-dextroamphetamine (ADDERALL) 10 MG tablet Take 10-20 mg by mouth daily at 12 noon. (Patient not taking: Reported on 02/06/2022)  0   amphetamine-dextroamphetamine (ADDERALL) 20 MG tablet Take 40 mg by mouth every morning.  (Patient not taking: Reported on 02/06/2022)     oxyCODONE-acetaminophen (PERCOCET/ROXICET) 5-325 MG tablet Take 1 tablet by mouth every 8 (eight) hours as needed for severe pain. (Patient not taking: Reported on 02/06/2022) 30 tablet 0   phenazopyridine (PYRIDIUM) 200 MG tablet Take 1 tablet (200 mg total) by mouth 3 (three) times daily as needed for pain. (Patient not taking: Reported on 02/06/2022) 10 tablet 0     Scheduled Meds:  [MAR Hold] acetaminophen  1,000 mg Oral Q6H   [MAR Hold] enoxaparin (LOVENOX) injection  60 mg Subcutaneous Q24H   [MAR Hold] famotidine  20 mg Oral BID   [MAR Hold] gabapentin  1,200 mg Oral BID   [MAR Hold] gabapentin  600 mg Oral Q1400   [MAR Hold]  ibuprofen  800 mg Oral Once   influenza vac split quadrivalent PF  0.5 mL Intramuscular Tomorrow-1000   [MAR Hold] insulin aspart  0-9 Units Subcutaneous TID WC   [MAR Hold] lamoTRIgine  200 mg Oral BID   [MAR Hold] metoprolol succinate  25 mg Oral Daily   [MAR Hold] ondansetron  4 mg Intravenous Once   Continuous Infusions:  [MAR Hold] aztreonam 2 g (02/07/22 0629)   PRN Meds:.[MAR Hold] acetaminophen **OR** [MAR Hold] acetaminophen, [MAR Hold] ALPRAZolam, [MAR Hold] alum & mag hydroxide-simeth, [MAR Hold] methocarbamol, [MAR Hold] ondansetron **OR** [MAR Hold] ondansetron (ZOFRAN) IV, [MAR Hold] oxyCODONE  Allergies:  Allergies  Allergen Reactions   Bee Venom Shortness Of Breath, Swelling and Rash   Cephalexin Anaphylaxis   Vancomycin Anaphylaxis and Hives    Has taken for bronchitis under medical supervision on site with benadryl   Equetro [Carbamazepine]     Loss of balance, trembling, inability to think, resulting in ER visit shortly after start of course    Adhesive [Tape] Rash   Cefotaxime Rash   Ciprofloxacin Hives and Rash   Clindamycin Rash   Latex Rash   Penicillins Hives and Rash    Has patient had a PCN reaction causing immediate rash, facial/tongue/throat swelling, SOB or lightheadedness with hypotension: Unknown Has patient had a PCN reaction causing severe rash involving mucus membranes or skin necrosis: Unknown Has patient had a PCN reaction that required hospitalization: Unknown Has patient had a PCN reaction occurring within the last 10 years: No If all of the above answers are "NO", then may proceed with Cephalosporin use.   Sulfa Antibiotics Rash    Family History  Problem Relation Age of Onset   Pneumonia Mother        Deceased   Arthritis Mother    Asthma Mother    Cancer Mother        pancreatic   COPD Mother    Depression Mother    Diabetes Mother  Kidney disease Mother    Liver cancer Father        Living   Arthritis Father    Cancer  Father        liver, prostate   COPD Father    Depression Father    Stroke Father    Heart disease Maternal Grandmother    Mental illness Maternal Grandmother    Breast cancer Maternal Grandmother    Alcohol abuse Paternal Grandfather    Breast cancer Paternal Aunt    Anesthesia problems Neg Hx    Hypotension Neg Hx    Malignant hyperthermia Neg Hx    Pseudochol deficiency Neg Hx    Colon cancer Neg Hx     Social History:  reports that she has never smoked. She has never used smokeless tobacco. She reports that she does not drink alcohol and does not use drugs.  ROS: A complete review of systems was performed.  All systems are negative except for pertinent findings as noted.  Physical Exam:  Vital signs in last 24 hours: Temp:  [97.6 F (36.4 C)-100 F (37.8 C)] 98.9 F (37.2 C) (10/06 1314) Pulse Rate:  [58-88] 61 (10/06 1314) Resp:  [18-31] 19 (10/06 1314) BP: (118-169)/(69-97) 119/79 (10/06 1314) SpO2:  [90 %-96 %] 96 % (10/06 1314) Constitutional:  Alert and oriented, No acute distress Cardiovascular: Regular rate and rhythm Respiratory: Normal respiratory effort, Lungs clear bilaterally GI: Abdomen is soft, nontender, nondistended, no abdominal masses GU: No CVA tenderness Neurologic: Grossly intact, no focal deficits Psychiatric: Normal mood and affect  Laboratory Data:  Recent Labs    02/06/22 1253 02/07/22 0615  WBC 9.6 10.3  HGB 11.2* 10.4*  HCT 37.4 34.8*  PLT 264 194    Recent Labs    02/06/22 1253 02/07/22 0615  NA 137 138  K 3.0* 3.6  CL 104 105  GLUCOSE 135* 101*  BUN 14 19  CALCIUM 8.4* 7.7*  CREATININE 1.12* 1.60*     Results for orders placed or performed during the hospital encounter of 02/06/22 (from the past 24 hour(s))  Lactic acid, plasma     Status: None   Collection Time: 02/06/22  3:08 PM  Result Value Ref Range   Lactic Acid, Venous 1.6 0.5 - 1.9 mmol/L  HIV Antibody (routine testing w rflx)     Status: None   Collection  Time: 02/06/22  5:10 PM  Result Value Ref Range   HIV Screen 4th Generation wRfx Non Reactive Non Reactive  Glucose, capillary     Status: Abnormal   Collection Time: 02/06/22  8:33 PM  Result Value Ref Range   Glucose-Capillary 180 (H) 70 - 99 mg/dL  CK     Status: None   Collection Time: 02/07/22  6:15 AM  Result Value Ref Range   Total CK 102 38 - 234 U/L  Basic metabolic panel     Status: Abnormal   Collection Time: 02/07/22  6:15 AM  Result Value Ref Range   Sodium 138 135 - 145 mmol/L   Potassium 3.6 3.5 - 5.1 mmol/L   Chloride 105 98 - 111 mmol/L   CO2 26 22 - 32 mmol/L   Glucose, Bld 101 (H) 70 - 99 mg/dL   BUN 19 6 - 20 mg/dL   Creatinine, Ser 1.60 (H) 0.44 - 1.00 mg/dL   Calcium 7.7 (L) 8.9 - 10.3 mg/dL   GFR, Estimated 38 (L) >60 mL/min   Anion gap 7 5 - 15  CBC  Status: Abnormal   Collection Time: 02/07/22  6:15 AM  Result Value Ref Range   WBC 10.3 4.0 - 10.5 K/uL   RBC 3.95 3.87 - 5.11 MIL/uL   Hemoglobin 10.4 (L) 12.0 - 15.0 g/dL   HCT 34.8 (L) 36.0 - 46.0 %   MCV 88.1 80.0 - 100.0 fL   MCH 26.3 26.0 - 34.0 pg   MCHC 29.9 (L) 30.0 - 36.0 g/dL   RDW 14.4 11.5 - 15.5 %   Platelets 194 150 - 400 K/uL   nRBC 0.0 0.0 - 0.2 %  Hemoglobin A1c     Status: Abnormal   Collection Time: 02/07/22  6:15 AM  Result Value Ref Range   Hgb A1c MFr Bld 6.4 (H) 4.8 - 5.6 %   Mean Plasma Glucose 136.98 mg/dL  D-dimer, quantitative     Status: Abnormal   Collection Time: 02/07/22  6:15 AM  Result Value Ref Range   D-Dimer, Quant 3.07 (H) 0.00 - 0.50 ug/mL-FEU  Glucose, capillary     Status: Abnormal   Collection Time: 02/07/22  7:47 AM  Result Value Ref Range   Glucose-Capillary 66 (L) 70 - 99 mg/dL  Glucose, capillary     Status: None   Collection Time: 02/07/22  8:51 AM  Result Value Ref Range   Glucose-Capillary 99 70 - 99 mg/dL  Blood gas, arterial     Status: Abnormal   Collection Time: 02/07/22  9:47 AM  Result Value Ref Range   FIO2 21.00 %   pH, Arterial  7.42 7.35 - 7.45   pCO2 arterial 36 32 - 48 mmHg   pO2, Arterial 60 (L) 83 - 108 mmHg   Bicarbonate 23.5 20.0 - 28.0 mmol/L   Acid-base deficit 0.8 0.0 - 2.0 mmol/L   O2 Saturation 93.5 %   Patient temperature 36.5    Collection site RIGHT RADIAL    Drawn by [id number]    Allens test (pass/fail) PASS PASS  Glucose, capillary     Status: Abnormal   Collection Time: 02/07/22 11:56 AM  Result Value Ref Range   Glucose-Capillary 169 (H) 70 - 99 mg/dL   Recent Results (from the past 240 hour(s))  Resp Panel by RT-PCR (Flu A&B, Covid) Anterior Nasal Swab     Status: None   Collection Time: 02/06/22 12:37 PM   Specimen: Anterior Nasal Swab  Result Value Ref Range Status   SARS Coronavirus 2 by RT PCR NEGATIVE NEGATIVE Final    Comment: (NOTE) SARS-CoV-2 target nucleic acids are NOT DETECTED.  The SARS-CoV-2 RNA is generally detectable in upper respiratory specimens during the acute phase of infection. The lowest concentration of SARS-CoV-2 viral copies this assay can detect is 138 copies/mL. A negative result does not preclude SARS-Cov-2 infection and should not be used as the sole basis for treatment or other patient management decisions. A negative result may occur with  improper specimen collection/handling, submission of specimen other than nasopharyngeal swab, presence of viral mutation(s) within the areas targeted by this assay, and inadequate number of viral copies(<138 copies/mL). A negative result must be combined with clinical observations, patient history, and epidemiological information. The expected result is Negative.  Fact Sheet for Patients:  EntrepreneurPulse.com.au  Fact Sheet for Healthcare Providers:  IncredibleEmployment.be  This test is no t yet approved or cleared by the [place] FDA and  has been authorized for detection and/or diagnosis of SARS-CoV-2 by FDA under an Emergency Use Authorization (EUA). This EUA will  remain  in  effect (meaning this test can be used) for the duration of the COVID-19 declaration under Section 564(b)(1) of the Act, 21 U.S.C.section 360bbb-3(b)(1), unless the authorization is terminated  or revoked sooner.       Influenza A by PCR NEGATIVE NEGATIVE Final   Influenza B by PCR NEGATIVE NEGATIVE Final    Comment: (NOTE) The Xpert Xpress SARS-CoV-2/FLU/RSV plus assay is intended as an aid in the diagnosis of influenza from Nasopharyngeal swab specimens and should not be used as a sole basis for treatment. Nasal washings and aspirates are unacceptable for Xpert Xpress SARS-CoV-2/FLU/RSV testing.  Fact Sheet for Patients: EntrepreneurPulse.com.au  Fact Sheet for Healthcare Providers: IncredibleEmployment.be  This test is not yet approved or cleared by the [place] FDA and has been authorized for detection and/or diagnosis of SARS-CoV-2 by FDA under an Emergency Use Authorization (EUA). This EUA will remain in effect (meaning this test can be used) for the duration of the COVID-19 declaration under Section 564(b)(1) of the Act, 21 U.S.C. section 360bbb-3(b)(1), unless the authorization is terminated or revoked.  Performed at [facility], [address], [place], [place] [address]   Blood Culture (routine x 2)     Status: None (Preliminary result)   Collection Time: 02/06/22 12:42 PM   Specimen: Right Antecubital; Blood  Result Value Ref Range Status   Specimen Description   Final    RIGHT ANTECUBITAL Performed at [facility], [address] [place], [place] [address]    Special Requests   Final    BOTTLES DRAWN AEROBIC AND ANAEROBIC Blood Culture results may not be optimal due to an excessive volume of blood received in culture bottles Performed at [facility], [address], [place], [place] [address]    Culture  Setup Time   Final    GRAM NEGATIVE RODS ANAEROBIC BOTTLE ONLY Gram Stain Report Called to,Read Back By  and Verified With: JACKSON N @ 0926 ON [id number] BY HENDERSON L GRAM NEGATIVE RODS AEROBIC BOTTLE ONLY RESULT PREVIOUSLY CALLED CRITICAL VALUE NOTED.  VALUE IS CONSISTENT WITH PREVIOUSLY REPORTED AND CALLED VALUE. Performed at [facility], [place] [address], [place], [place] [address]    Culture GRAM NEGATIVE RODS  Final   Report Status PENDING  Incomplete  Blood Culture (routine x 2)     Status: None (Preliminary result)   Collection Time: 02/06/22 12:53 PM   Specimen: Right Antecubital; Blood  Result Value Ref Range Status   Specimen Description   Final    RIGHT ANTECUBITAL Performed at [facility], [address], [place], [place] [address]    Special Requests   Final    BOTTLES DRAWN AEROBIC AND ANAEROBIC Blood Culture results may not be optimal due to an excessive volume of blood received in culture bottles Performed at [facility], [address] [place], [place] [address]    Culture  Setup Time   Final    GRAM NEGATIVE RODS IN BOTH AEROBIC AND ANAEROBIC BOTTLES Gram Stain Report Called to,Read Back By and Verified With: JACKSON @ [phone] ON [id number] BY HENDERSON L CRITICAL RESULT CALLED TO, READ BACK BY AND VERIFIED WITH: PHARMD STEVEN [place]  02/07/22 _0  BY AB Performed at [facility], [place] [address] [place], [place] [address]    Culture GRAM NEGATIVE RODS  Final   Report Status PENDING  Incomplete  Blood Culture ID Panel (Reflexed)     Status: Abnormal   Collection Time: 02/06/22 12:53 PM  Result Value Ref Range Status  Enterococcus faecalis NOT DETECTED NOT DETECTED Final   Enterococcus Faecium NOT DETECTED NOT DETECTED Final   Listeria monocytogenes NOT DETECTED NOT DETECTED Final   Staphylococcus species NOT DETECTED NOT DETECTED Final   Staphylococcus aureus (BCID) NOT DETECTED NOT DETECTED Final   Staphylococcus epidermidis NOT DETECTED NOT DETECTED Final   Staphylococcus lugdunensis NOT DETECTED NOT DETECTED Final   Streptococcus species NOT DETECTED NOT  DETECTED Final   Streptococcus agalactiae NOT DETECTED NOT DETECTED Final   Streptococcus pneumoniae NOT DETECTED NOT DETECTED Final   Streptococcus pyogenes NOT DETECTED NOT DETECTED Final   A.calcoaceticus-baumannii NOT DETECTED NOT DETECTED Final   Bacteroides fragilis NOT DETECTED NOT DETECTED Final   Enterobacterales DETECTED (A) NOT DETECTED Final    Comment: Enterobacterales represent a large order of gram negative bacteria, not a single organism. CRITICAL RESULT CALLED TO, READ BACK BY AND VERIFIED WITH: PHARMD STEVEN [place]  02/07/22 _0  BY AB    Enterobacter cloacae complex NOT DETECTED NOT DETECTED Final   Escherichia coli DETECTED (A) NOT DETECTED Final    Comment: CRITICAL RESULT CALLED TO, READ BACK BY AND VERIFIED WITH: PHARMD STEVEN [place]  02/07/22 _1  BY AB    Klebsiella aerogenes NOT DETECTED NOT DETECTED Final   Klebsiella oxytoca NOT DETECTED NOT DETECTED Final   Klebsiella pneumoniae NOT DETECTED NOT DETECTED Final   Proteus species NOT DETECTED NOT DETECTED Final   Salmonella species NOT DETECTED NOT DETECTED Final   Serratia marcescens NOT DETECTED NOT DETECTED Final   Haemophilus influenzae NOT DETECTED NOT DETECTED Final   Neisseria meningitidis NOT DETECTED NOT DETECTED Final   Pseudomonas aeruginosa NOT DETECTED NOT DETECTED Final   Stenotrophomonas maltophilia NOT DETECTED NOT DETECTED Final   Candida albicans NOT DETECTED NOT DETECTED Final   Candida auris NOT DETECTED NOT DETECTED Final   Candida glabrata NOT DETECTED NOT DETECTED Final   Candida krusei NOT DETECTED NOT DETECTED Final   Candida parapsilosis NOT DETECTED NOT DETECTED Final   Candida tropicalis NOT DETECTED NOT DETECTED Final   Cryptococcus neoformans/gattii NOT DETECTED NOT DETECTED Final   CTX-M ESBL NOT DETECTED NOT DETECTED Final   Carbapenem resistance IMP NOT DETECTED NOT DETECTED Final   Carbapenem resistance KPC NOT DETECTED NOT DETECTED Final   Carbapenem resistance NDM NOT  DETECTED NOT DETECTED Final   Carbapenem resist OXA 48 LIKE NOT DETECTED NOT DETECTED Final   Carbapenem resistance VIM NOT DETECTED NOT DETECTED Final    Comment: Performed at [facility], [address], [place],  [address]    Renal Function: Recent Labs    02/06/22 1253 02/07/22 0615  CREATININE 1.12* 1.60*   Estimated Creatinine Clearance: 49.3 mL/min (A) (by C-G formula based on SCr of 1.6 mg/dL (H)).  Radiologic Imaging: NM Pulmonary Perfusion  Result Date: 02/07/2022 CLINICAL DATA:  Shortness of breath, positive D-dimer EXAM: NUCLEAR MEDICINE PERFUSION LUNG SCAN TECHNIQUE: Perfusion images were obtained in multiple projections after intravenous injection of radiopharmaceutical. Ventilation scans intentionally deferred if perfusion scan and chest x-ray adequate for interpretation during COVID 19 epidemic. RADIOPHARMACEUTICALS:  4.4 mCi Tc-94mMAA IV COMPARISON:  Chest radiograph done on 02/06/2022 FINDINGS: There are no wedge-shaped perfusion defects. There is ill-defined area of decreased uptake in posterior left upper lung field. IMPRESSION: Low probability for pulmonary embolism. Electronically Signed   By: PElmer PickerM.D.   On: 02/07/2022 11:51   CT RENAL STONE STUDY  Result Date: 02/07/2022 CLINICAL DATA:  Right flank pain. EXAM: CT ABDOMEN AND PELVIS WITHOUT  CONTRAST TECHNIQUE: Multidetector CT imaging of the abdomen and pelvis was performed following the standard protocol without IV contrast. RADIATION DOSE REDUCTION: This exam was performed according to the departmental dose-optimization program which includes automated exposure control, adjustment of the mA and/or kV according to patient size and/or use of iterative reconstruction technique. COMPARISON:  02/04/2022 FINDINGS: Lower chest: Bibasilar scarring and atelectasis. No pleural effusions. The heart is normal in size. No pericardial effusion. Hepatobiliary: No hepatic lesions or intrahepatic biliary  dilatation. The gallbladder is surgically absent. No common bile duct dilatation. Pancreas: No mass, inflammation ductal dilatation. Spleen: Normal size.  No focal lesions. Adrenals/Urinary Tract: The adrenal glands are normal. New moderate right-sided hydronephrosis and hydroureter. There are 2 ureteral calculi. The more proximal calculus is at the L5 level and measures a maximum of or 6 mm. The more distal calculus is located at the mid sacral level and also measures 6 mm. Bilateral renal calculi. There is a 6 mm calculus in the left extra renal pelvis but no obstructing left ureteral calculi. No bladder calculi. No worrisome renal or bladder lesions are identified without contrast. 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 The duodenum, small bowel and colon unremarkable. Vascular/Lymphatic: Stable scattered atherosclerotic calcifications involving the aorta and iliac arteries but no aneurysm. No mesenteric or retroperitoneal lymphadenopathy. A retroaortic left renal vein is noted incidentally. Reproductive: Surgically absent. Other: Small amount of perinephric fluid on the right side also tracking down along the right ureter and into the pelvis. Musculoskeletal: No significant bony findings. IMPRESSION: 1. Two new 6 mm right ureteral calculi causing moderate right-sided hydronephrosis and hydroureter. 2. Bilateral renal calculi. 3. 6 mm calculus in the left extra renal pelvis but no obstructing left ureteral calculi. 4. 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 5. Status post cholecystectomy. No biliary dilatation. 6. Aortic atherosclerosis. Aortic Atherosclerosis (ICD10-I70.0). Electronically Signed   By: Marijo Molly Berry M.D.   On: 02/07/2022 10:13   DG Chest 2 View  Result Date: 02/06/2022 CLINICAL DATA:  Short of breath for just over 1 week. Diagnosed with kidney stones and put on antibiotics this week. EXAM: CHEST - 2 VIEW  COMPARISON:  07/17/2021.  CT, 03/29/2009. FINDINGS: Mild enlargement of cardiac silhouette. No mediastinal or hilar masses. No evidence of adenopathy Clear lungs.  No pleural effusion or pneumothorax. Skeletal structures are intact. IMPRESSION: No active cardiopulmonary disease. Electronically Signed   By: Lajean Manes M.D.   On: 02/06/2022 12:40    I independently reviewed the above imaging studies.  Impression/Recommendation: #1.  Obstructing right ureteral stone, left renal pelvis stone with sign of infection: CT A/P 02/07/2022 with 2 separate 6 mm right ureteral stones with moderate to severe right-sided hydronephrosis.  Also present is a 6 mm stone in the left extrarenal pelvis.  Febrile at 103.1.  WBC 9.6.  Creatinine 1.6 from baseline 0.9.  Urinalysis with positive nitrite.  Urine culture pending.   -We will plan to take the operating room for cystoscopy, bilateral ureteral stent placement.  She will require definitive treatment of stones as outpatient. Messaged schedulers to arrange followup. -Follow-up urine culture.  Wean antibiotics as able.  She will require culture specific antibiotics outpatient for 2 weeks.   -The risks, benefits and alternatives of cystoscopy with bilateral ureteral JJ stent placement was discussed with the patient.  Risks include, but are not limited to: bleeding, urinary tract infection, ureteral injury, ureteral stricture disease, chronic pain, urinary symptoms, bladder injury, stent migration, the  need for nephrostomy tube placement, MI, CVA, DVT, PE and the inherent risks with general anesthesia.  The patient voices understanding and wishes to proceed.   Matt R. Tuvia Woodrick MD 02/07/2022, 2:42 PM  [facility]  Pager: [phone]  CC: Orson Eva, MD

## 2022-02-07 NOTE — Progress Notes (Signed)
  Transition of Care [facility]) Screening Note   Patient Details  Name: Molly Berry Date of Birth: Sep 22, 1965   Transition of Care [facility]) CM/SW Contact:    Ihor Gully, LCSW Phone Number: 02/07/2022, 1:19 PM    Transition of Care Department [facility]) has reviewed patient and no TOC needs have been identified at this time. We will continue to monitor patient advancement through interdisciplinary progression rounds. If new patient transition needs arise, please place a TOC consult.

## 2022-02-07 NOTE — Assessment & Plan Note (Addendum)
With obstructive uropathy with right ureteral stone x 2 with hydronephrosis -follow urine culture -continue aztreonam -urology consulted, discussed with Dr. Randon Goldsmith to [facility] for ureteral stent

## 2022-02-07 NOTE — Progress Notes (Signed)
Was receiving report at bedside at 0722, patient complaining of pain 10/10 to right side, noted patient shaking. Vital signs: T-100.2, P-69, BP-127/111, O2-91% at Room air. MD Tat made aware. New orders placed. At [phone] noted patient lips discolored, patient had increased weakness. Charge nurse Raquel Sarna RN made aware, went to assess patient. Oxygen saturation in the 80s, placed patient on 2 liters of oxygen. MD Tat made aware. New orders placed.

## 2022-02-08 ENCOUNTER — Encounter (HOSPITAL_COMMUNITY): Payer: Self-pay | Admitting: Urology

## 2022-02-08 LAB — BASIC METABOLIC PANEL
Anion gap: 5 (ref 5–15)
BUN: 25 mg/dL — ABNORMAL HIGH (ref 6–20)
CO2: 25 mmol/L (ref 22–32)
Calcium: 7.7 mg/dL — ABNORMAL LOW (ref 8.9–10.3)
Chloride: 106 mmol/L (ref 98–111)
Creatinine, Ser: 1.34 mg/dL — ABNORMAL HIGH (ref 0.44–1.00)
GFR, Estimated: 47 mL/min — ABNORMAL LOW (ref >60)
Glucose, Bld: 184 mg/dL — ABNORMAL HIGH (ref 70–99)
Potassium: 3.9 mmol/L (ref 3.5–5.1)
Sodium: 136 mmol/L (ref 135–145)

## 2022-02-08 LAB — URINE CULTURE: Culture: 10000 — AB

## 2022-02-08 LAB — CBC
HCT: 30.8 % — ABNORMAL LOW (ref 36.0–46.0)
Hemoglobin: 9 g/dL — ABNORMAL LOW (ref 12.0–15.0)
MCH: 26 pg (ref 26.0–34.0)
MCHC: 29.2 g/dL — ABNORMAL LOW (ref 30.0–36.0)
MCV: 89 fL (ref 80.0–100.0)
Platelets: 188 10*3/uL (ref 150–400)
RBC: 3.46 MIL/uL — ABNORMAL LOW (ref 3.87–5.11)
RDW: 14.7 % (ref 11.5–15.5)
WBC: 8.2 10*3/uL (ref 4.0–10.5)
nRBC: 0 % (ref 0.0–0.2)

## 2022-02-08 LAB — GLUCOSE, CAPILLARY
Glucose-Capillary: 150 mg/dL — ABNORMAL HIGH (ref 70–99)
Glucose-Capillary: 210 mg/dL — ABNORMAL HIGH (ref 70–99)
Glucose-Capillary: 157 mg/dL — ABNORMAL HIGH (ref 70–99)
Glucose-Capillary: 147 mg/dL — ABNORMAL HIGH (ref 70–99)

## 2022-02-08 MED ORDER — VORTIOXETINE HBR 5 MG PO TABS
20.0000 mg | ORAL_TABLET | Freq: Every day | ORAL | Status: AC
  Administered 2022-02-08 – 2022-02-09 (×2): 20 mg via ORAL
  Filled 2022-02-08 (×2): qty 4

## 2022-02-08 MED ORDER — PANTOPRAZOLE SODIUM 40 MG PO TBEC
40.0000 mg | DELAYED_RELEASE_TABLET | Freq: Every day | ORAL | Status: DC
  Administered 2022-02-08 – 2022-02-10 (×3): 40 mg via ORAL
  Filled 2022-02-08 (×3): qty 1

## 2022-02-08 MED ORDER — TIZANIDINE HCL 4 MG PO TABS
4.0000 mg | ORAL_TABLET | Freq: Every day | ORAL | Status: DC
  Administered 2022-02-08 – 2022-02-09 (×2): 4 mg via ORAL
  Filled 2022-02-08 (×2): qty 1

## 2022-02-08 MED ORDER — SODIUM CHLORIDE 0.9 % IV SOLN: INTRAVENOUS | Status: DC

## 2022-02-08 MED ORDER — BUPROPION HCL ER (SR) 150 MG PO TB12
150.0000 mg | ORAL_TABLET | Freq: Every day | ORAL | Status: DC
  Administered 2022-02-09 – 2022-02-10 (×2): 150 mg via ORAL
  Filled 2022-02-08 (×2): qty 1

## 2022-02-08 NOTE — Consult Note (Addendum)
1 Day Post-Op Subjective: No acute events overnight.  Doing well after procedure yesterday.  Patient is afebrile and hemodynamically stable.  Making adequate urine via Foley catheter.  Tolerating regular diet.  Creatinine downtrending to 1.3 this morning.  Continue on aztreonam for UTI and bacteremia  Objective: Vital signs in last 24 hours: Temp:  [98 F (36.7 C)-98.9 F (37.2 C)] 98 F (36.7 C) (10/07 0504) Pulse Rate:  [50-74] 74 (10/07 1044) Resp:  [13-20] 18 (10/07 0504) BP: (105-135)/(66-99) 124/68 (10/07 1044) SpO2:  [94 %-97 %] 94 % (10/07 0504)  Intake/Output from previous day: 10/06 0701 - 10/07 0700 In: 100 [IV Piggyback:100] Out: 1000 [Urine:1000] Intake/Output this shift: Total I/O In: -  Out: 500 [Urine:500]  Physical Exam:  General: Alert and oriented CV: Regular rate Lungs: Normal work of breathing Abdomen: Soft, ND GU: Foley catheter in place to gravity drainage, urine clear yellow Ext: NT, No erythema  Lab Results: Recent Labs    02/06/22 1253 02/07/22 0615 02/08/22 0535  HGB 11.2* 10.4* 9.0*  HCT 37.4 34.8* 30.8*   BMET Recent Labs    02/07/22 0615 02/08/22 0535  NA 138 136  K 3.6 3.9  CL 105 106  CO2 26 25  GLUCOSE 101* 184*  BUN 19 25*  CREATININE 1.60* 1.34*  CALCIUM 7.7* 7.7*     Studies/Results: DG C-Arm 1-60 Min-No Report  Result Date: 02/07/2022 Fluoroscopy was utilized by the requesting physician.  No radiographic interpretation.   NM Pulmonary Perfusion  Result Date: 02/07/2022 CLINICAL DATA:  Shortness of breath, positive D-dimer EXAM: NUCLEAR MEDICINE PERFUSION LUNG SCAN TECHNIQUE: Perfusion images were obtained in multiple projections after intravenous injection of radiopharmaceutical. Ventilation scans intentionally deferred if perfusion scan and chest x-ray adequate for interpretation during COVID 19 epidemic. RADIOPHARMACEUTICALS:  4.4 mCi Tc-109mMAA IV COMPARISON:  Chest radiograph done on 02/06/2022 FINDINGS: There  are no wedge-shaped perfusion defects. There is ill-defined area of decreased uptake in posterior left upper lung field. IMPRESSION: Low probability for pulmonary embolism. Electronically Signed   By: PElmer PickerM.D.   On: 02/07/2022 11:51   CT RENAL STONE STUDY  Result Date: 02/07/2022 CLINICAL DATA:  Right flank pain. EXAM: CT ABDOMEN AND PELVIS WITHOUT CONTRAST TECHNIQUE: Multidetector CT imaging of the abdomen and pelvis was performed following the standard protocol without IV contrast. RADIATION DOSE REDUCTION: This exam was performed according to the departmental dose-optimization program which includes automated exposure control, adjustment of the mA and/or kV according to patient size and/or use of iterative reconstruction technique. COMPARISON:  02/04/2022 FINDINGS: Lower chest: Bibasilar scarring and atelectasis. No pleural effusions. The heart is normal in size. No pericardial effusion. Hepatobiliary: No hepatic lesions or intrahepatic biliary dilatation. The gallbladder is surgically absent. No common bile duct dilatation. Pancreas: No mass, inflammation ductal dilatation. Spleen: Normal size.  No focal lesions. Adrenals/Urinary Tract: The adrenal glands are normal. New moderate right-sided hydronephrosis and hydroureter. There are 2 ureteral calculi. The more proximal calculus is at the L5 level and measures a maximum of or 6 mm. The more distal calculus is located at the mid sacral level and also measures 6 mm. Bilateral renal calculi. There is a 6 mm calculus in the left extra renal pelvis but no obstructing left ureteral calculi. No bladder calculi. No worrisome renal or bladder lesions are identified without contrast. 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 The duodenum, small bowel and colon unremarkable. Vascular/Lymphatic: Stable scattered atherosclerotic calcifications involving the aorta and iliac  arteries but no aneurysm. No  mesenteric or retroperitoneal lymphadenopathy. A retroaortic left renal vein is noted incidentally. Reproductive: Surgically absent. Other: Small amount of perinephric fluid on the right side also tracking down along the right ureter and into the pelvis. Musculoskeletal: No significant bony findings. IMPRESSION: 1. Two new 6 mm right ureteral calculi causing moderate right-sided hydronephrosis and hydroureter. 2. Bilateral renal calculi. 3. 6 mm calculus in the left extra renal pelvis but no obstructing left ureteral calculi. 4. 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 5. Status post cholecystectomy. No biliary dilatation. 6. Aortic atherosclerosis. Aortic Atherosclerosis (ICD10-I70.0). Electronically Signed   By: Marijo Sanes M.D.   On: 02/07/2022 10:13   DG Chest 2 View  Result Date: 02/06/2022 CLINICAL DATA:  Short of breath for just over 1 week. Diagnosed with kidney stones and put on antibiotics this week. EXAM: CHEST - 2 VIEW COMPARISON:  07/17/2021.  CT, 03/29/2009. FINDINGS: Mild enlargement of cardiac silhouette. No mediastinal or hilar masses. No evidence of adenopathy Clear lungs.  No pleural effusion or pneumothorax. Skeletal structures are intact. IMPRESSION: No active cardiopulmonary disease. Electronically Signed   By: Lajean Manes M.D.   On: 02/06/2022 12:40    Assessment/Plan: Obstructing right ureteral stone and left renal pelvis stone in setting of infection, status post bilateral ureteral stent placement on 02/07/2022 Urosepsis, Bacteremia  -Continue bilateral ureteral stents at this time. -Okay for trial of void once patient is afebrile x24 hours. -Continue antibiotics and tailor appropriately as culture data becomes available.  She will need at least 14-day course of antibiotics due to complicated urinary tract infection -She will need to follow-up outpatient for definitive stone treatment.  [facility] will set this up for her.   LOS: 1  day   Obafunbi Abimbola 02/08/2022, 11:46 AM  I have seen and examined the patient and agree with the above assessment and plan.  -Void trial once afebrile for 24 hours -F/u urine cx and treat with cx specific abx for 2 weeks. -Will arrange f/u -Following peripherally. Please call with questions.  Matt R. [facility]  Pager: [phone]

## 2022-02-08 NOTE — Progress Notes (Signed)
PROGRESS NOTE  [name]  DOB: Jul 01, 1965  PCP: Nicholes Rough, PA-C [id number]  DOA: 02/06/2022  LOS: 1 day  Hospital Day: 3  Brief narrative: Molly Berry is a 56 y.o. female with PMH significant for DM2, HTN, HLD, COPD, CKD, fibromyalgia, hypothyroidism, chronic pain syndrome, opioid dependence, history of gastric bypass 2016, uterine cancer 2013 s/p hysterectomy Patient presented to the ED at [facility] 10/5 with complaint of fever, chills, nausea, vomiting, dysuria, right flank pain and malaise. 10/3, patient was seen at [facility] for similar symptoms for 24 hours.  CT abdomen and pelvis showed a 7 mm stone in the left renal pelvis with bilateral nonobstructive nephrolithiasis.  She was discharged home from the ED with Macrobid, Robaxin.  She stated that her symptoms worsened and hence came to [facility] ED on 10/5.  In the ED, the patient was febrile up to 103.1 F.  She was hemodynamically stable with oxygen saturation 88% on room air.   WBC 9.6, UA shows 21-50 WBC, + nitrite. With consideration to her history of allergies, patient was started on daptomycin and aztreonam initially. On 02/07/22, she complained of worsening right flank pain.   CT renal stone protocol showed two 6 mm right ureteral calculi with hydronephrosis.   Urology, Dr. Rexene Alberts, was consulted and recommended transfer to [facility] long for ureteral stent placement. Notably, blood cultures sent on admission resulted E. coli. Aztreonam was continued, and daptomycin was discontinued.   Subjective: Patient was seen and examined this morning.  Pleasant middle-aged Caucasian female.  Lying on bed..  Feels better. Chart reviewed Remains afebrile overnight, hemodynamically stable, on 3 L oxygen Last set of labs from this morning showed WBC of 8.2, hemoglobin 9, BUN/creatinine 25/1.34  Assessment and plan: Sepsis secondary to right pyelonephritis E. coli bacteremia Obstructive uropathy Presented with  right flank pain, vomiting. CT renal stone protocol with two 6 mm right ureteral calculi and hydronephrosis Blood culture and urine culture grew E. coli. Currently on IV aztreonam. No fever.  WBC count and lactic acid level normal. Will discuss with ID/pharmacy for appropriate oral antibiotics at discharge. 10/6, underwent cystoscopy, bilateral ureteral stent placement.  Per Op note, she was noted to have severe right hydronephrosis with grossly purulent urine, mild left hydronephrosis.  Successful bilateral ureteral stent placement. Recent Labs  Lab 02/06/22 1253 02/06/22 1508 02/07/22 0615 02/08/22 0535  WBC 9.6  --  10.3 8.2  LATICACIDVEN 1.6 1.6  --   --     AKI (acute kidney injury)  Baseline creatinine normal.  Presented with creatinine elevated to 1.6 improvement with IV fluid, 1.3 today. Recent Labs    07/12/21 1117 07/17/21 1741 02/06/22 1253 02/07/22 0615 02/08/22 0535  BUN 21* '13 14 19 '$ 25*  CREATININE 1.01* 0.90 1.12* 1.60* 1.34*   Acute respiratory failure with hypoxia Multifactorial: Obesity, OSA/OHS, sepsis, surgery Currently on 2 L of oxygen.  Wean down as tolerated.  Check ambulatory oxygen requirement  Essential hypertension Continue Toprol 25 mg daily.  Type 2 diabetes mellitus A1c 6.4 on 02/07/2022 PTA on Sitagliptin Currently on sliding scale insulin with Accu-Cheks Recent Labs  Lab 02/07/22 1156 02/07/22 1455 02/07/22 1613 02/07/22 1737 02/08/22 0813  GLUCAP 169* 105* 101* 153* 150*   Elevated d-dimer VQ scan-llow probability PE   Chronic pain, fibromyalgia  Chronic opioid dependence  PDMP reviewed -Patient receives Percocet 10/325, #90, last refill 01/22/2022 -Patient received Xanax, 1 mg, #90, last refill 01/13/2022 Continue pain control with Neurontin 200 mg  twice daily, oxycodone as needed, Tylenol.  Hold NSAIDs   Morbid obesity  -Body mass index is 44.29 kg/m. Patient has been advised to make an attempt to improve diet and exercise  patterns to aid in weight loss.  Anxiety/depression PTA on Xanax 1 mg 3 times daily as needed, Wellbutrin 150 mg daily, Lamictal 200 mg twice daily, Resume the same.  Goals of care   Code Status: Full Code    Mobility: Encourage ambulation  Skin assessment:     Nutritional status:  Body mass index is 44.29 kg/m.          Diet:  Diet Order             Diet Carb Modified Fluid consistency: Thin; Room service appropriate? Yes  Diet effective now                   DVT prophylaxis: Lovenox subcu    Antimicrobials: IV aztreonam Fluid: NS at 75 mill per hour Consultants: Urology Family Communication: None at bedside  Status is: Inpatient  Continue in-hospital care because: Pending culture report.  On IV antibiotics Level of care: Med-Surg   Dispo: The patient is from: Home              Anticipated d/c is to: Home in 1 to 2 days              Patient currently is not medically stable to d/c.   Difficult to place patient No     Infusions:   sodium chloride 75 mL/hr at 02/08/22 1047   aztreonam 2 g (02/08/22 0913)    Scheduled Meds:  acetaminophen  1,000 mg Oral Q6H   buPROPion  150 mg Oral Daily   enoxaparin (LOVENOX) injection  60 mg Subcutaneous Q24H   famotidine  20 mg Oral BID   gabapentin  1,200 mg Oral BID   gabapentin  600 mg Oral Q1400   insulin aspart  0-9 Units Subcutaneous TID WC   lamoTRIgine  200 mg Oral BID   metoprolol succinate  25 mg Oral Daily   pantoprazole  40 mg Oral Daily   tiZANidine  4 mg Oral QHS   vortioxetine HBr  20 mg Oral QHS    PRN meds: acetaminophen **OR** acetaminophen, ALPRAZolam, alum & mag hydroxide-simeth, methocarbamol, ondansetron **OR** ondansetron (ZOFRAN) IV, oxyCODONE   Antimicrobials: Anti-infectives (From admission, onward)    Start     Dose/Rate Route Frequency Ordered Stop   02/06/22 1700  DAPTOmycin (CUBICIN) 650 mg in sodium chloride 0.9 % IVPB  Status:  Discontinued        8 mg/kg  79.6 kg  (Adjusted) 126 mL/hr over 30 Minutes Intravenous Daily 02/06/22 1545 02/07/22 1234   02/06/22 1500  aztreonam (AZACTAM) 2 g in sodium chloride 0.9 % 100 mL IVPB        2 g 200 mL/hr over 30 Minutes Intravenous Every 8 hours 02/06/22 1440         Objective: Vitals:   02/08/22 0504 02/08/22 1044  BP: 107/72 124/68  Pulse: (!) 50 74  Resp: 18   Temp: 98 F (36.7 C)   SpO2: 94%     Intake/Output Summary (Last 24 hours) at 02/08/2022 1124 Last data filed at 02/08/2022 0713 Gross per 24 hour  Intake 100 ml  Output 1500 ml  Net -1400 ml   Filed Weights   02/06/22 1206  Weight: 117 kg   Weight change:  Body mass index is 44.29 kg/m.  Physical Exam: General exam: Pleasant, middle-aged Caucasian female. Skin: No rashes, lesions or ulcers. HEENT: Atraumatic, normocephalic, no obvious bleeding Lungs: Clear to auscultation bilaterally CVS: Regular rate and rhythm, no murmur GI/Abd soft, nontender, nondistended, bowels are present.  Costovertebral tenderness improving bilaterally CNS: Alert, awake motor x3 Psychiatry: Mood appropriate Extremities: No pedal edema, no calf tenderness  Data Review: I have personally reviewed the laboratory data and studies available.  F/u labs ordered Unresulted Labs (From admission, onward)     Start     Ordered   02/13/22 0500  Creatinine, serum  (enoxaparin (LOVENOX)    CrCl >/= 30 ml/min)  Weekly,   R     Comments: while on enoxaparin therapy    02/06/22 1710   02/09/22 0500  CBC with Differential/Platelet  Tomorrow morning,   R        02/08/22 1124   02/09/22 2130  Basic metabolic panel  Tomorrow morning,   R        02/08/22 1124   02/09/22 0500  Magnesium  Tomorrow morning,   R        02/08/22 1124   Unscheduled  Phosphorus  Tomorrow morning,   R        02/08/22 1124            Signed, Terrilee Croak, MD [facility] 02/08/2022

## 2022-02-08 NOTE — Progress Notes (Signed)
Mobility Specialist - Progress Note   02/08/22 1223  Mobility  Activity Ambulated independently in hallway  Activity Response Tolerated well  Distance Ambulated (ft) 250 ft  $Mobility charge 1 Mobility  Level of Assistance Standby assist, set-up cues, supervision of patient - no hands on  Assistive Device None  HOB Elevated/Bed Position Self regulated  Range of Motion/Exercises Active   Pt was found in bed and agreeable to ambulate. Began getting SOB towards the end. At EOS returned to recliner chair with all necessities in reach and chair alarm on.  Ferd Hibbs Mobility Specialist

## 2022-02-09 ENCOUNTER — Encounter (HOSPITAL_COMMUNITY): Payer: Self-pay | Admitting: Hematology and Oncology

## 2022-02-09 LAB — BASIC METABOLIC PANEL
Anion gap: 6 (ref 5–15)
BUN: 24 mg/dL — ABNORMAL HIGH (ref 6–20)
CO2: 26 mmol/L (ref 22–32)
Calcium: 8 mg/dL — ABNORMAL LOW (ref 8.9–10.3)
Chloride: 107 mmol/L (ref 98–111)
Creatinine, Ser: 1.17 mg/dL — ABNORMAL HIGH (ref 0.44–1.00)
GFR, Estimated: 55 mL/min — ABNORMAL LOW (ref >60)
Glucose, Bld: 119 mg/dL — ABNORMAL HIGH (ref 70–99)
Potassium: 3.3 mmol/L — ABNORMAL LOW (ref 3.5–5.1)
Sodium: 139 mmol/L (ref 135–145)

## 2022-02-09 LAB — CBC WITH DIFFERENTIAL/PLATELET
Abs Immature Granulocytes: 0.04 10*3/uL (ref 0.00–0.07)
Basophils Absolute: 0 10*3/uL (ref 0.0–0.1)
Basophils Relative: 0 %
Eosinophils Absolute: 0.2 10*3/uL (ref 0.0–0.5)
Eosinophils Relative: 2 %
HCT: 29.5 % — ABNORMAL LOW (ref 36.0–46.0)
Hemoglobin: 8.6 g/dL — ABNORMAL LOW (ref 12.0–15.0)
Immature Granulocytes: 1 %
Lymphocytes Relative: 24 %
Lymphs Abs: 1.7 10*3/uL (ref 0.7–4.0)
MCH: 25.9 pg — ABNORMAL LOW (ref 26.0–34.0)
MCHC: 29.2 g/dL — ABNORMAL LOW (ref 30.0–36.0)
MCV: 88.9 fL (ref 80.0–100.0)
Monocytes Absolute: 0.5 10*3/uL (ref 0.1–1.0)
Monocytes Relative: 7 %
Neutro Abs: 4.7 10*3/uL (ref 1.7–7.7)
Neutrophils Relative %: 66 %
Platelets: 243 10*3/uL (ref 150–400)
RBC: 3.32 MIL/uL — ABNORMAL LOW (ref 3.87–5.11)
RDW: 14.8 % (ref 11.5–15.5)
WBC: 7.1 10*3/uL (ref 4.0–10.5)
nRBC: 0 % (ref 0.0–0.2)

## 2022-02-09 LAB — CULTURE, BLOOD (ROUTINE X 2)

## 2022-02-09 LAB — GLUCOSE, CAPILLARY
Glucose-Capillary: 139 mg/dL — ABNORMAL HIGH (ref 70–99)
Glucose-Capillary: 82 mg/dL (ref 70–99)
Glucose-Capillary: 93 mg/dL (ref 70–99)
Glucose-Capillary: 149 mg/dL — ABNORMAL HIGH (ref 70–99)

## 2022-02-09 LAB — VITAMIN B12: Vitamin B-12: 140 pg/mL — ABNORMAL LOW (ref 180–914)

## 2022-02-09 LAB — MAGNESIUM: Magnesium: 2.6 mg/dL — ABNORMAL HIGH (ref 1.7–2.4)

## 2022-02-09 LAB — FERRITIN: Ferritin: 42 ng/mL (ref 11–307)

## 2022-02-09 LAB — RETICULOCYTES
Immature Retic Fract: 18 % — ABNORMAL HIGH (ref 2.3–15.9)
RBC.: 3.32 MIL/uL — ABNORMAL LOW (ref 3.87–5.11)
Retic Count, Absolute: 36.5 10*3/uL (ref 19.0–186.0)
Retic Ct Pct: 1.1 % (ref 0.4–3.1)

## 2022-02-09 LAB — IRON AND TIBC
Iron: 18 ug/dL — ABNORMAL LOW (ref 28–170)
Saturation Ratios: 5 % — ABNORMAL LOW (ref 10.4–31.8)
TIBC: 353 ug/dL (ref 250–450)
UIBC: 335 ug/dL

## 2022-02-09 LAB — PHOSPHORUS: Phosphorus: 2.8 mg/dL (ref 2.5–4.6)

## 2022-02-09 LAB — URINE CULTURE: Culture: NO GROWTH

## 2022-02-09 LAB — FOLATE: Folate: 7.4 ng/mL (ref >5.9)

## 2022-02-09 MED ORDER — MAGIC MOUTHWASH W/LIDOCAINE
5.0000 mL | Freq: Four times a day (QID) | ORAL | Status: DC | PRN
  Administered 2022-02-09 – 2022-02-10 (×2): 5 mL via ORAL
  Filled 2022-02-09 (×3): qty 5

## 2022-02-09 MED ORDER — POTASSIUM CHLORIDE CRYS ER 20 MEQ PO TBCR
40.0000 meq | EXTENDED_RELEASE_TABLET | Freq: Once | ORAL | Status: AC
  Administered 2022-02-09: 40 meq via ORAL
  Filled 2022-02-09: qty 2

## 2022-02-09 MED ORDER — CHLORHEXIDINE GLUCONATE CLOTH 2 % EX PADS: 6.0000 | MEDICATED_PAD | Freq: Every day | CUTANEOUS | Status: DC

## 2022-02-09 MED ORDER — CEFDINIR 300 MG PO CAPS
300.0000 mg | ORAL_CAPSULE | Freq: Two times a day (BID) | ORAL | Status: DC
  Administered 2022-02-09 – 2022-02-10 (×3): 300 mg via ORAL
  Filled 2022-02-09 (×3): qty 1

## 2022-02-09 NOTE — Progress Notes (Signed)
PROGRESS NOTE  Molly Berry  DOB: [birth year]  PCP: Nicholes Rough, PA-C RKY:[id number]  DOA: 02/06/2022  LOS: 2 days  Hospital Day: 4  Brief narrative: Molly Berry is a 56 y.o. female with PMH significant for DM2, HTN, HLD, COPD, CKD, fibromyalgia, hypothyroidism, chronic pain syndrome, opioid dependence, history of gastric bypass 2016, uterine cancer 2013 s/p hysterectomy Patient presented to the ED at [facility] 10/5 with complaint of fever, chills, nausea, vomiting, dysuria, right flank pain and malaise. 10/3, patient was seen at [facility] for similar symptoms for 24 hours.  CT abdomen and pelvis showed a 7 mm stone in the left renal pelvis with bilateral nonobstructive nephrolithiasis.  She was discharged home from the ED with Macrobid, Robaxin.  She stated that her symptoms worsened and hence came to [facility] ED on 10/5.  In the ED, the patient was febrile up to 103.1 F.  She was hemodynamically stable with oxygen saturation 88% on room air.   WBC 9.6, UA shows 21-50 WBC, + nitrite. With consideration to her history of allergies, patient was started on daptomycin and aztreonam initially. On 02/07/22, she complained of worsening right flank pain.   CT renal stone protocol showed two 6 mm right ureteral calculi with hydronephrosis.   Urology, Dr. Rexene Alberts, was consulted and recommended transfer to [facility] long for ureteral stent placement. Notably, blood cultures sent on admission resulted E. coli. Aztreonam was continued, and daptomycin was discontinued.   Subjective: Patient was seen and examined this morning.  Lying on bed.  Not in distress.  No new symptoms.  Feels better than at presentation.  But Foley catheter removed this morning.  Able to void.  No fever in last 24 hours.  WBC count improving.  Assessment and plan: Sepsis secondary to right pyelonephritis E. coli bacteremia and UTI Obstructive uropathy Presented with right flank pain, vomiting. CT renal stone  protocol with two 6 mm right ureteral calculi and hydronephrosis Blood culture and urine culture grew E. coli. Currently on IV aztreonam. No fever.  WBC count and lactic acid level normal. 10/6, underwent cystoscopy, bilateral ureteral stent placement.  Per Op note, she was noted to have severe right hydronephrosis with grossly purulent urine, mild left hydronephrosis.  Successful bilateral ureteral stent placement. Discussed with ID Dr. Gale Journey this morning about antibiotic choice at discharge.  Patient has multiple allergies.  I am not sure how strong and accurate are these allergies. But it definitely poses a challenge in finding the right antibiotic choice. Patient states that she believes she has done well on oral Omnicef.  I discussed with ID, we will switch her from IV aztreonam to oral Omnicef today and monitor her for next 24 hours.  If does okay, plan to discharge home tomorrow on oral Omnicef. Recent Labs  Lab 02/06/22 1253 02/06/22 1508 02/07/22 0615 02/08/22 0535 02/09/22 0546  WBC 9.6  --  10.3 8.2 7.1  LATICACIDVEN 1.6 1.6  --   --   --     AKI (acute kidney injury)  Baseline creatinine normal.  Presented with creatinine elevated to 1.6 improvement with IV fluid, 1.3 today. Recent Labs    07/12/21 1117 07/17/21 1741 02/06/22 1253 02/07/22 0615 02/08/22 0535 02/09/22 0546  BUN 21* '13 14 19 '$ 25* 24*  CREATININE 1.01* 0.90 1.12* 1.60* 1.34* 1.17*   Hypokalemia Potassium low at 3.3 today.  Oral replacement ordered. Recent Labs  Lab 02/06/22 1253 02/07/22 0615 02/08/22 0535 02/09/22 0546  K 3.0* 3.6  3.9 3.3*  MG  --   --   --  2.6*  PHOS  --   --   --  2.8   Acute respiratory failure with hypoxia Multifactorial: Obesity, OSA/OHS, sepsis, surgery Currently on 2 L of oxygen.  Wean down as tolerated.  Check ambulatory oxygen requirement  Acute on chronic anemia Hemoglobin at baseline close to 11.  Last 3 days, patient is having gradual drop in hemoglobin.  No active  bleeding however.  Obtain anemia panel and FOBT. Recent Labs    07/17/21 1741 02/06/22 1253 02/07/22 0615 02/08/22 0535 02/09/22 0546  HGB 11.4* 11.2* 10.4* 9.0* 8.6*  MCV 89.6 88.2 88.1 89.0 88.9   Essential hypertension Continue Toprol 25 mg daily.  Type 2 diabetes mellitus A1c 6.4 on 02/07/2022 PTA on Sitagliptin Currently on sliding scale insulin with Accu-Cheks Recent Labs  Lab 02/08/22 1254 02/08/22 1620 02/08/22 2123 02/09/22 0744 02/09/22 1143  GLUCAP 210* 157* 147* 139* 82   Elevated d-dimer VQ scan-llow probability PE   Chronic pain, fibromyalgia  Chronic opioid dependence  PDMP reviewed -Patient receives Percocet 10/325, #90, last refill 01/22/2022 -Patient received Xanax, 1 mg, #90, last refill 01/13/2022 Continue pain control with Neurontin 200 mg twice daily, oxycodone as needed, Tylenol.  Hold NSAIDs   Morbid obesity  -Body mass index is 44.29 kg/m. Patient has been advised to make an attempt to improve diet and exercise patterns to aid in weight loss.  Anxiety/depression PTA on Xanax 1 mg 3 times daily as needed, Wellbutrin 150 mg daily, Lamictal 200 mg twice daily, Resume the same.  Goals of care   Code Status: Full Code    Mobility: Encourage ambulation  Skin assessment:     Nutritional status:  Body mass index is 44.29 kg/m.          Diet:  Diet Order             Diet Carb Modified Fluid consistency: Thin; Room service appropriate? Yes  Diet effective now                   DVT prophylaxis: Lovenox subcu   Antimicrobials:  switched to oral Omnicef today Fluid: Stop IV fluid Consultants: Urology Family Communication: None at bedside  Status is: Inpatient  Continue in-hospital care because: Monitor the response to oral Omnicef Level of care: Med-Surg   Dispo: The patient is from: Home              Anticipated d/c is to: Home in 1 to 2 days              Patient currently is not medically stable to d/c.   Difficult  to place patient No  Infusions:     Scheduled Meds:  acetaminophen  1,000 mg Oral Q6H   buPROPion  150 mg Oral Daily   cefdinir  300 mg Oral Q12H   enoxaparin (LOVENOX) injection  60 mg Subcutaneous Q24H   famotidine  20 mg Oral BID   gabapentin  1,200 mg Oral BID   insulin aspart  0-9 Units Subcutaneous TID WC   lamoTRIgine  200 mg Oral BID   metoprolol succinate  25 mg Oral Daily   pantoprazole  40 mg Oral Daily   tiZANidine  4 mg Oral QHS   vortioxetine HBr  20 mg Oral QHS    PRN meds: acetaminophen **OR** acetaminophen, ALPRAZolam, alum & mag hydroxide-simeth, methocarbamol, ondansetron **OR** ondansetron (ZOFRAN) IV, oxyCODONE   Antimicrobials: Anti-infectives (From admission,  onward)    Start     Dose/Rate Route Frequency Ordered Stop   02/09/22 1300  cefdinir (OMNICEF) capsule 300 mg        300 mg Oral Every 12 hours 02/09/22 1208     02/06/22 1700  DAPTOmycin (CUBICIN) 650 mg in sodium chloride 0.9 % IVPB  Status:  Discontinued        8 mg/kg  79.6 kg (Adjusted) 126 mL/hr over 30 Minutes Intravenous Daily 02/06/22 1545 02/07/22 1234   02/06/22 1500  aztreonam (AZACTAM) 2 g in sodium chloride 0.9 % 100 mL IVPB  Status:  Discontinued        2 g 200 mL/hr over 30 Minutes Intravenous Every 8 hours 02/06/22 1440 02/09/22 1208       Objective: Vitals:   02/09/22 0900 02/09/22 1230  BP: 102/62 120/77  Pulse:  (!) 51  Resp:  18  Temp: 98.4 F (36.9 C) 98.5 F (36.9 C)  SpO2:  100%    Intake/Output Summary (Last 24 hours) at 02/09/2022 1418 Last data filed at 02/09/2022 1047 Gross per 24 hour  Intake 240 ml  Output 1700 ml  Net -1460 ml   Filed Weights   02/06/22 1206  Weight: 117 kg   Weight change:  Body mass index is 44.29 kg/m.   Physical Exam: General exam: Pleasant, middle-aged Caucasian female.  Not in pain or distress. Skin: No rashes, lesions or ulcers. HEENT: Atraumatic, normocephalic, no obvious bleeding Lungs: Clear to auscultation  bilaterally CVS: Regular rate and rhythm, no murmur GI/Abd soft, nontender, nondistended, bowels are present.  Costovertebral tenderness significantly better. CNS: Alert, awake, oriented x3 Psychiatry: Mood appropriate Extremities: No pedal edema, no calf tenderness  Data Review: I have personally reviewed the laboratory data and studies available.  F/u labs ordered Unresulted Labs (From admission, onward)     Start     Ordered   02/13/22 0500  Creatinine, serum  (enoxaparin (LOVENOX)    CrCl >/= 30 ml/min)  Weekly,   R     Comments: while on enoxaparin therapy    02/06/22 1710   02/09/22 1418  Occult blood card to lab, stool  Once,   R        02/09/22 1417   02/09/22 1418  Vitamin B12  (Anemia Panel (PNL))  Once,   R        02/09/22 1417   02/09/22 1418  Folate  (Anemia Panel (PNL))  Once,   R        02/09/22 1417   02/09/22 1418  Iron and TIBC  (Anemia Panel (PNL))  Once,   R        02/09/22 1417   02/09/22 1418  Ferritin  (Anemia Panel (PNL))  Once,   R        02/09/22 1417   02/09/22 1418  Reticulocytes  (Anemia Panel (PNL))  Once,   R        02/09/22 1417            Signed, Terrilee Croak, MD [facility] 02/09/2022

## 2022-02-10 LAB — GLUCOSE, CAPILLARY
Glucose-Capillary: 68 mg/dL — ABNORMAL LOW (ref 70–99)
Glucose-Capillary: 76 mg/dL (ref 70–99)
Glucose-Capillary: 114 mg/dL — ABNORMAL HIGH (ref 70–99)

## 2022-02-10 MED ORDER — SACCHAROMYCES BOULARDII 250 MG PO CAPS: 250.0000 mg | ORAL_CAPSULE | Freq: Two times a day (BID) | ORAL | 0 refills | Status: AC

## 2022-02-10 MED ORDER — VITAMIN B-12 1000 MCG PO TABS: 1000.0000 ug | ORAL_TABLET | Freq: Every day | ORAL | Status: DC

## 2022-02-10 MED ORDER — LIDOCAINE VISCOUS HCL 2 % MT SOLN: 5.0000 mL | Freq: Three times a day (TID) | OROMUCOSAL | 0 refills | Status: DC | PRN

## 2022-02-10 MED ORDER — PHENAZOPYRIDINE HCL 200 MG PO TABS: 200.0000 mg | ORAL_TABLET | Freq: Three times a day (TID) | ORAL | 0 refills | Status: DC | PRN

## 2022-02-10 MED ORDER — CEFDINIR 300 MG PO CAPS: 300.0000 mg | ORAL_CAPSULE | Freq: Two times a day (BID) | ORAL | 0 refills | Status: DC

## 2022-02-10 MED ORDER — CYANOCOBALAMIN 1000 MCG/ML IJ SOLN
1000.0000 ug | Freq: Once | INTRAMUSCULAR | Status: AC
  Administered 2022-02-10: 1000 ug via INTRAMUSCULAR
  Filled 2022-02-10: qty 1

## 2022-02-10 MED ORDER — CYANOCOBALAMIN 1000 MCG PO TABS: 1000.0000 ug | ORAL_TABLET | Freq: Every day | ORAL | 2 refills | Status: AC

## 2022-02-10 NOTE — Discharge Summary (Addendum)
Physician Discharge Summary  [name]:[id number] DOB: 1965-05-09 DOA: 02/06/2022  PCP: Molly Rough, PA-C  Admit date: 02/06/2022 Discharge date: 02/10/2022  Admitted From: home Discharge disposition: home  Recommendations at discharge:  Complete the course of antibiotics with oral Omnicef for next 10 days. Maximal dose and oral hygiene for aphthous ulcers Stop Sitagliptin Start Pyridium  Brief narrative: Molly Berry is a 56 y.o. female with PMH significant for DM2, HTN, HLD, COPD, CKD, fibromyalgia, hypothyroidism, chronic pain syndrome, opioid dependence, history of gastric bypass 2016, uterine cancer 2013 s/p hysterectomy Patient presented to the ED at [facility] 10/5 with complaint of fever, chills, nausea, vomiting, dysuria, right flank pain and malaise. 10/3, patient was seen at [facility] for similar symptoms for 24 hours.  CT abdomen and pelvis showed a 7 mm stone in the left renal pelvis with bilateral nonobstructive nephrolithiasis.  She was discharged home from the ED with Macrobid, Robaxin.  She stated that her symptoms worsened and hence came to [facility] ED on 10/5.  In the ED, the patient was febrile up to 103.1 F.  She was hemodynamically stable with oxygen saturation 88% on room air.   WBC 9.6, UA shows 21-50 WBC, + nitrite. With consideration to her history of allergies, patient was started on daptomycin and aztreonam initially. On 02/07/22, she complained of worsening right flank pain.   CT renal stone protocol showed two 6 mm right ureteral calculi with hydronephrosis.   Urology, Dr. Rexene Berry, was consulted and recommended transfer to [facility] long for ureteral stent placement. Notably, blood cultures sent on admission resulted E. coli. Aztreonam was continued, and daptomycin was discontinued.   Subjective: Patient was seen and examined this morning.  Lying down on bed.  Not in distress.  Tolerated oral antibiotics. Feels ready to go  home.  Hospital course: Sepsis secondary to right pyelonephritis E. coli bacteremia and UTI Obstructive uropathy Presented with right flank pain, vomiting. CT renal stone protocol with two 6 mm right ureteral calculi and hydronephrosis Patient was empirically started on IV aztreonam (because of number of allergies to antibiotics). 10/6, underwent cystoscopy, bilateral ureteral stent placement.  Per Op note, she was noted to have severe right hydronephrosis with grossly purulent urine, mild left hydronephrosis.  Successful bilateral ureteral stent placement. Blood culture and urine culture grew E. coli. Discussed with ID Dr. Gale Journey 10/8.  Started the patient on a trial of oral Omnicef.  She tolerated it well last 24 hours. This has been the same for next 10 days to complete 2 weeks course of antibiotics.  With probiotics. Patient states she done better with Pyridium for stent related pain in the past.  Restart the same. Recent Labs  Lab 02/06/22 1253 02/06/22 1508 02/07/22 0615 02/08/22 0535 02/09/22 0546  WBC 9.6  --  10.3 8.2 7.1  LATICACIDVEN 1.6 1.6  --   --   --     AKI (acute kidney injury)  Baseline creatinine normal.  Presented with creatinine elevated to 1.6.  Gradually improved.  1.17 today. Recent Labs    07/12/21 1117 07/17/21 1741 02/06/22 1253 02/07/22 0615 02/08/22 0535 02/09/22 0546  BUN 21* '13 14 19 '$ 25* 24*  CREATININE 1.01* 0.90 1.12* 1.60* 1.34* 1.17*   Hypokalemia Potassium low at 3.3 today.  Oral replacement given. Recent Labs  Lab 02/06/22 1253 02/07/22 0615 02/08/22 0535 02/09/22 0546  K 3.0* 3.6 3.9 3.3*  MG  --   --   --  2.6*  PHOS  --   --   --  2.8   Acute respiratory failure with hypoxia Multifactorial: Obesity, OSA/OHS, sepsis, surgery Oxygenation improved.  Currently on room air  Acute on chronic anemia Iron deficiency, vitamin B12 deficiency Hemoglobin at baseline close to 11.  Last 3 days, patient is having gradual drop in hemoglobin.   No active bleeding however.  In eval this morning showed low ferritin as well as low vitamin B12 level. Start an oral iron supplement.  Give 1 dose of IM vitamin B12.  Start on oral vitamin B12 supplement. Recent Labs    07/17/21 1741 02/06/22 1253 02/07/22 0615 02/08/22 0535 02/09/22 0546  HGB 11.4* 11.2* 10.4* 9.0* 8.6*  MCV 89.6 88.2 88.1 89.0 88.9  VITAMINB12  --   --   --   --  140*  FOLATE  --   --   --   --  7.4  FERRITIN  --   --   --   --  42  TIBC  --   --   --   --  353  IRON  --   --   --   --  18*  RETICCTPCT  --   --   --   --  1.1   Aphthous ulcers Patient has multiple small sores in the mouth.  There is no gingival burning for last few days.  Oral exam looks like she has multiple small aphthous ulcers.  Recommend Magic mouthwash and to maintain oral hygiene.  Essential hypertension Continue Toprol 25 mg daily and Lasix as before  Type 2 diabetes mellitus A1c 6.4 on 02/07/2022 PTA on Sitagliptin Agresta level has been running in the lower side of normal while in the hospital.  I believe she can stay off Sitagliptin.  Elevated d-dimer VQ scan-llow probability PE   Chronic pain, fibromyalgia  Chronic opioid dependence  PDMP reviewed -Patient receives Percocet 10/325, #90, last refill 01/22/2022 -Patient received Xanax, 1 mg, #90, last refill 01/13/2022 Continue pain control with Neurontin 200 mg twice daily, oxycodone as needed, Tylenol.  Hold NSAIDs   Morbid obesity  -Body mass index is 44.29 kg/m. Patient has been advised to make an attempt to improve diet and exercise patterns to aid in weight loss.  Anxiety/depression PTA on Xanax 1 mg 3 times daily as needed, Wellbutrin 150 mg daily, Lamictal 200 mg twice daily, Resume the same.  Wounds:  - Incision (Closed) 07/16/21 Shoulder Left (Active)  Date First Assessed/Time First Assessed: 07/16/21 0830   Location: Shoulder  Location Orientation: Left  Present on Admission: No    Assessments 07/16/2021  9:20  AM 07/16/2021 10:50 AM  Dressing Type Benzoine;Adhesive strips --  Dressing -- Clean, Dry, Intact  Site / Wound Assessment -- Dressing in place / Unable to assess  Margins Attached edges (approximated) --  Drainage Amount -- None  Treatment -- Ice applied     No associated orders.    Discharge Exam:   Vitals:   02/09/22 1230 02/09/22 2109 02/10/22 0424 02/10/22 1204  BP: 120/77 (!) 153/83 (!) 144/72 (!) 163/77  Pulse: (!) 51 77 64 (!) 51  Resp: '18 17 17 16  '$ Temp: 98.5 F (36.9 C) 98 F (36.7 C) 97.9 F (36.6 C) 98.1 F (36.7 C)  TempSrc: Oral Oral Oral Oral  SpO2: 100% 99% 94% 98%  Weight:      Height:        Body mass index is 44.29 kg/m.   General exam: Pleasant,  middle-aged Caucasian female.  Not in pain or distress. Skin: No rashes, lesions or ulcers. HEENT: Atraumatic, normocephalic, no obvious bleeding Lungs: Clear to auscultation bilaterally CVS: Regular rate and rhythm, no murmur GI/Abd soft, nontender, nondistended, bowels are present.  Costovertebral tenderness significantly better. CNS: Alert, awake, oriented x3 Psychiatry: Mood appropriate Extremities: No pedal edema, no calf tenderness  Follow ups:    Follow-up Information     Molly Rough, PA-C Follow up.   Specialty: Physician Assistant Contact information: [place] [phone]                 Discharge Instructions:   Discharge Instructions     Call MD for:  difficulty breathing, headache or visual disturbances   Complete by: As directed    Call MD for:  extreme fatigue   Complete by: As directed    Call MD for:  hives   Complete by: As directed    Call MD for:  persistant dizziness or light-headedness   Complete by: As directed    Call MD for:  persistant nausea and vomiting   Complete by: As directed    Call MD for:  severe uncontrolled pain   Complete by: As directed    Call MD for:  temperature >100.4   Complete by: As directed    Diet -  low sodium heart healthy   Complete by: As directed    Discharge instructions   Complete by: As directed    Recommendations at discharge:   Complete the course of antibiotics with oral Omnicef for next 10 days.  Maximal dose and oral hygiene for aphthous ulcers  Stop Sitagliptin  General discharge instructions: Follow with Primary MD Molly Rough, PA-C in 7 days  Please request your PCP  to go over your hospital tests, procedures, radiology results at the follow up. Please get your medicines reviewed and adjusted.  Your PCP may decide to repeat certain labs or tests as needed. Do not drive, operate heavy machinery, perform activities at heights, swimming or participation in water activities or provide baby sitting services if your were admitted for syncope or siezures until you have seen by Primary MD or a Neurologist and advised to do so again. [place] Controlled Substance Reporting System database was reviewed. Do not drive, operate heavy machinery, perform activities at heights, swim, participate in water activities or provide baby-sitting services while on medications for pain, sleep and mood until your outpatient physician has reevaluated you and advised to do so again.  You are strongly recommended to comply with the dose, frequency and duration of prescribed medications. Activity: As tolerated with Full fall precautions use walker/cane & assistance as needed Avoid using any recreational substances like cigarette, tobacco, alcohol, or non-prescribed drug. If you experience worsening of your admission symptoms, develop shortness of breath, life threatening emergency, suicidal or homicidal thoughts you must seek medical attention immediately by calling 911 or calling your MD immediately  if symptoms less severe. You must read complete instructions/literature along with all the possible adverse reactions/side effects for all the medicines you take and that have been prescribed to you.  Take any new medicine only after you have completely understood and accepted all the possible adverse reactions/side effects.  Wear Seat belts while driving. You were cared for by a hospitalist during your hospital stay. If you have any questions about your discharge medications or the care you received while you were in the hospital after you are discharged, you  can call the unit and ask to speak with the hospitalist or the covering physician. Once you are discharged, your primary care physician will handle any further medical issues. Please note that NO REFILLS for any discharge medications will be authorized once you are discharged, as it is imperative that you return to your primary care physician (or establish a relationship with a primary care physician if you do not have one).   Increase activity slowly   Complete by: As directed        Discharge Medications:   Allergies as of 02/10/2022       Reactions   Bee Venom Shortness Of Breath, Swelling, Rash   Cephalexin Anaphylaxis   Vancomycin Anaphylaxis, Hives   Has taken for bronchitis under medical supervision on site with benadryl   Equetro [carbamazepine]    Loss of balance, trembling, inability to think, resulting in ER visit shortly after start of course    Adhesive [tape] Rash   Cefotaxime Rash   Ciprofloxacin Hives, Rash   Clindamycin Rash   Latex Rash   Penicillins Hives, Rash   Has patient had a PCN reaction causing immediate rash, facial/tongue/throat swelling, SOB or lightheadedness with hypotension: Unknown Has patient had a PCN reaction causing severe rash involving mucus membranes or skin necrosis: Unknown Has patient had a PCN reaction that required hospitalization: Unknown Has patient had a PCN reaction occurring within the last 10 years: No If all of the above answers are "NO", then may proceed with Cephalosporin use.   Sulfa Antibiotics Rash        Medication List     STOP taking these medications     amphetamine-dextroamphetamine 10 MG tablet Commonly known as: ADDERALL   amphetamine-dextroamphetamine 20 MG tablet Commonly known as: ADDERALL   meloxicam 15 MG tablet Commonly known as: Mobic   nitrofurantoin (macrocrystal-monohydrate) 100 MG capsule Commonly known as: MACROBID   sitaGLIPtin 100 MG tablet Commonly known as: JANUVIA       TAKE these medications    albuterol 108 (90 Base) MCG/ACT inhaler Commonly known as: VENTOLIN HFA Inhale 2 puffs into the lungs every 6 (six) hours as needed for shortness of breath or wheezing.   ALPRAZolam 1 MG tablet Commonly known as: XANAX Take 1 mg by mouth 3 (three) times daily as needed for anxiety or sleep.   buPROPion 150 MG 12 hr tablet Commonly known as: WELLBUTRIN SR Take 150 mg by mouth daily.   cefdinir 300 MG capsule Commonly known as: OMNICEF Take 1 capsule (300 mg total) by mouth every 12 (twelve) hours for 10 days.   cyanocobalamin 1000 MCG tablet Take 1 tablet (1,000 mcg total) by mouth daily. Start taking on: February 11, 2022   dicyclomine 20 MG tablet Commonly known as: BENTYL Take 20 mg by mouth 3 (three) times daily as needed for spasms.   diphenhydrAMINE 25 mg capsule Commonly known as: BENADRYL Take 25 mg by mouth at bedtime.   esomeprazole 40 MG capsule Commonly known as: NEXIUM Take 40 mg by mouth 2 (two) times daily before a meal.   famotidine 40 MG tablet Commonly known as: PEPCID Take 40 mg by mouth 2 (two) times daily.   furosemide 20 MG tablet Commonly known as: LASIX Take 20 mg by mouth daily.   gabapentin 600 MG tablet Commonly known as: NEURONTIN Take 600 mg by mouth See admin instructions. Take 1200 mg by mouth in the morning, and 1200 mg at night   lamoTRIgine 200 MG tablet Commonly  known as: LAMICTAL Take 200 mg by mouth 2 (two) times daily.   magic mouthwash (lidocaine, diphenhydrAMINE, alum & mag hydroxide) suspension Swish and spit 5 mLs 3 (three) times daily as  needed for mouth pain.   methocarbamol 750 MG tablet Commonly known as: ROBAXIN Take 750 mg by mouth 3 (three) times daily as needed for muscle spasms.   metoprolol succinate 50 MG 24 hr tablet Commonly known as: TOPROL-XL Take 50 mg by mouth daily.   ondansetron 8 MG tablet Commonly known as: ZOFRAN Take 8 mg by mouth every 8 (eight) hours as needed for nausea or vomiting.   oxyCODONE-acetaminophen 10-325 MG tablet Commonly known as: PERCOCET Take 1 tablet by mouth every 4 (four) hours as needed for pain. What changed: Another medication with the same name was removed. Continue taking this medication, and follow the directions you see here.   phenazopyridine 200 MG tablet Commonly known as: PYRIDIUM Take 1 tablet (200 mg total) by mouth 3 (three) times daily as needed for pain.   saccharomyces boulardii 250 MG capsule Commonly known as: FLORASTOR Take 1 capsule (250 mg total) by mouth 2 (two) times daily for 14 days.   simvastatin 20 MG tablet Commonly known as: ZOCOR Take 20 mg by mouth daily.   sucralfate 1 g tablet Commonly known as: CARAFATE Take 1 g by mouth 4 (four) times daily.   SUMAtriptan 100 MG tablet Commonly known as: IMITREX Take 100 mg by mouth every 2 (two) hours as needed for migraine.   tiZANidine 4 MG tablet Commonly known as: ZANAFLEX Take 4 mg by mouth at bedtime.   Vitamin D (Ergocalciferol) 1.25 MG (50000 UNIT) Caps capsule Commonly known as: DRISDOL Take 50,000 Units by mouth once a week.   vortioxetine HBr 20 MG Tabs tablet Commonly known as: TRINTELLIX Take 20 mg by mouth daily.         The results of significant diagnostics from this hospitalization (including imaging, microbiology, ancillary and laboratory) are listed below for reference.    Procedures and Diagnostic Studies:   DG C-Arm 1-60 Min-No Report  Result Date: 02/07/2022 Fluoroscopy was utilized by the requesting physician.  No radiographic interpretation.   NM  Pulmonary Perfusion  Result Date: 02/07/2022 CLINICAL DATA:  Shortness of breath, positive D-dimer EXAM: NUCLEAR MEDICINE PERFUSION LUNG SCAN TECHNIQUE: Perfusion images were obtained in multiple projections after intravenous injection of radiopharmaceutical. Ventilation scans intentionally deferred if perfusion scan and chest x-ray adequate for interpretation during COVID 19 epidemic. RADIOPHARMACEUTICALS:  4.4 mCi Tc-31mMAA IV COMPARISON:  Chest radiograph done on 02/06/2022 FINDINGS: There are no wedge-shaped perfusion defects. There is ill-defined area of decreased uptake in posterior left upper lung field. IMPRESSION: Low probability for pulmonary embolism. Electronically Signed   By: PElmer PickerM.D.   On: 02/07/2022 11:51   CT RENAL STONE STUDY  Result Date: 02/07/2022 CLINICAL DATA:  Right flank pain. EXAM: CT ABDOMEN AND PELVIS WITHOUT CONTRAST TECHNIQUE: Multidetector CT imaging of the abdomen and pelvis was performed following the standard protocol without IV contrast. RADIATION DOSE REDUCTION: This exam was performed according to the departmental dose-optimization program which includes automated exposure control, adjustment of the mA and/or kV according to patient size and/or use of iterative reconstruction technique. COMPARISON:  02/04/2022 FINDINGS: Lower chest: Bibasilar scarring and atelectasis. No pleural effusions. The heart is normal in size. No pericardial effusion. Hepatobiliary: No hepatic lesions or intrahepatic biliary dilatation. The gallbladder is surgically absent. No common bile duct dilatation. Pancreas: No mass,  inflammation ductal dilatation. Spleen: Normal size.  No focal lesions. Adrenals/Urinary Tract: The adrenal glands are normal. New moderate right-sided hydronephrosis and hydroureter. There are 2 ureteral calculi. The more proximal calculus is at the L5 level and measures a maximum of or 6 mm. The more distal calculus is located at the mid sacral level and also  measures 6 mm. Bilateral renal calculi. There is a 6 mm calculus in the left extra renal pelvis but no obstructing left ureteral calculi. No bladder calculi. No worrisome renal or bladder lesions are identified without contrast. 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 The duodenum, small bowel and colon unremarkable. Vascular/Lymphatic: Stable scattered atherosclerotic calcifications involving the aorta and iliac arteries but no aneurysm. No mesenteric or retroperitoneal lymphadenopathy. A retroaortic left renal vein is noted incidentally. Reproductive: Surgically absent. Other: Small amount of perinephric fluid on the right side also tracking down along the right ureter and into the pelvis. Musculoskeletal: No significant bony findings. IMPRESSION: 1. Two new 6 mm right ureteral calculi causing moderate right-sided hydronephrosis and hydroureter. 2. Bilateral renal calculi. 3. 6 mm calculus in the left extra renal pelvis but no obstructing left ureteral calculi. 4. 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 5. Status post cholecystectomy. No biliary dilatation. 6. Aortic atherosclerosis. Aortic Atherosclerosis (ICD10-I70.0). Electronically Signed   By: Marijo Sanes M.D.   On: 02/07/2022 10:13   DG Chest 2 View  Result Date: 02/06/2022 CLINICAL DATA:  Short of breath for just over 1 week. Diagnosed with kidney stones and put on antibiotics this week. EXAM: CHEST - 2 VIEW COMPARISON:  07/17/2021.  CT, 03/29/2009. FINDINGS: Mild enlargement of cardiac silhouette. No mediastinal or hilar masses. No evidence of adenopathy Clear lungs.  No pleural effusion or pneumothorax. Skeletal structures are intact. IMPRESSION: No active cardiopulmonary disease. Electronically Signed   By: Lajean Manes M.D.   On: 02/06/2022 12:40     Labs:   Basic Metabolic Panel: Recent Labs  Lab 02/06/22 1253 02/07/22 0615 02/08/22 0535  02/09/22 0546  NA 137 138 136 139  K 3.0* 3.6 3.9 3.3*  CL 104 105 106 107  CO2 '23 26 25 26  '$ GLUCOSE 135* 101* 184* 119*  BUN 14 19 25* 24*  CREATININE 1.12* 1.60* 1.34* 1.17*  CALCIUM 8.4* 7.7* 7.7* 8.0*  MG  --   --   --  2.6*  PHOS  --   --   --  2.8   GFR Estimated Creatinine Clearance: 67.5 mL/min (A) (by C-G formula based on SCr of 1.17 mg/dL (H)). Liver Function Tests: Recent Labs  Lab 02/06/22 1253  AST 18  ALT 15  ALKPHOS 98  BILITOT 0.7  PROT 7.5  ALBUMIN 3.9   No results for input(s): "LIPASE", "AMYLASE" in the last 168 hours. No results for input(s): "AMMONIA" in the last 168 hours. Coagulation profile Recent Labs  Lab 02/06/22 1237  INR 1.1    CBC: Recent Labs  Lab 02/06/22 1253 02/07/22 0615 02/08/22 0535 02/09/22 0546  WBC 9.6 10.3 8.2 7.1  NEUTROABS 7.9*  --   --  4.7  HGB 11.2* 10.4* 9.0* 8.6*  HCT 37.4 34.8* 30.8* 29.5*  MCV 88.2 88.1 89.0 88.9  PLT 264 194 188 243   Cardiac Enzymes: Recent Labs  Lab 02/07/22 0615  CKTOTAL 102   BNP: Invalid input(s): "POCBNP" CBG: Recent Labs  Lab 02/09/22 1626 02/09/22 2106 02/10/22 0733 02/10/22 0839 02/10/22 1200  GLUCAP 93 149* 68* 76  114*   D-Dimer No results for input(s): "DDIMER" in the last 72 hours. Hgb A1c No results for input(s): "HGBA1C" in the last 72 hours. Lipid Profile No results for input(s): "CHOL", "HDL", "LDLCALC", "TRIG", "CHOLHDL", "LDLDIRECT" in the last 72 hours. Thyroid function studies No results for input(s): "TSH", "T4TOTAL", "T3FREE", "THYROIDAB" in the last 72 hours.  Invalid input(s): "FREET3" Anemia work up Recent Labs    02/09/22 0546  VITAMINB12 140*  FOLATE 7.4  FERRITIN 42  TIBC 353  IRON 18*  RETICCTPCT 1.1   Microbiology Recent Results (from the past 240 hour(s))  Resp Panel by RT-PCR (Flu A&B, Covid) Anterior Nasal Swab     Status: None   Collection Time: 02/06/22 12:37 PM   Specimen: Anterior Nasal Swab  Result Value Ref Range Status    SARS Coronavirus 2 by RT PCR NEGATIVE NEGATIVE Final    Comment: (NOTE) SARS-CoV-2 target nucleic acids are NOT DETECTED.  The SARS-CoV-2 RNA is generally detectable in upper respiratory specimens during the acute phase of infection. The lowest concentration of SARS-CoV-2 viral copies this assay can detect is 138 copies/mL. A negative result does not preclude SARS-Cov-2 infection and should not be used as the sole basis for treatment or other patient management decisions. A negative result may occur with  improper specimen collection/handling, submission of specimen other than nasopharyngeal swab, presence of viral mutation(s) within the areas targeted by this assay, and inadequate number of viral copies(<138 copies/mL). A negative result must be combined with clinical observations, patient history, and epidemiological information. The expected result is Negative.  Fact Sheet for Patients:  EntrepreneurPulse.com.au  Fact Sheet for Healthcare Providers:  IncredibleEmployment.be  This test is no t yet approved or cleared by the [place] FDA and  has been authorized for detection and/or diagnosis of SARS-CoV-2 by FDA under an Emergency Use Authorization (EUA). This EUA will remain  in effect (meaning this test can be used) for the duration of the COVID-19 declaration under Section 564(b)(1) of the Act, 21 U.S.C.section 360bbb-3(b)(1), unless the authorization is terminated  or revoked sooner.       Influenza A by PCR NEGATIVE NEGATIVE Final   Influenza B by PCR NEGATIVE NEGATIVE Final    Comment: (NOTE) The Xpert Xpress SARS-CoV-2/FLU/RSV plus assay is intended as an aid in the diagnosis of influenza from Nasopharyngeal swab specimens and should not be used as a sole basis for treatment. Nasal washings and aspirates are unacceptable for Xpert Xpress SARS-CoV-2/FLU/RSV testing.  Fact Sheet for  Patients: EntrepreneurPulse.com.au  Fact Sheet for Healthcare Providers: IncredibleEmployment.be  This test is not yet approved or cleared by the [place] FDA and has been authorized for detection and/or diagnosis of SARS-CoV-2 by FDA under an Emergency Use Authorization (EUA). This EUA will remain in effect (meaning this test can be used) for the duration of the COVID-19 declaration under Section 564(b)(1) of the Act, 21 U.S.C. section 360bbb-3(b)(1), unless the authorization is terminated or revoked.  Performed at [facility], [address], [place], [place] [address]   Urine Culture     Status: Abnormal   Collection Time: 02/06/22 12:37 PM   Specimen: Urine, Catheterized  Result Value Ref Range Status   Specimen Description   Final    URINE, CATHETERIZED Performed at [facility], [address] [place], [place] [address]    Special Requests   Final    NONE Performed at [facility], [address], [place], [place] [address]    Culture 10,000 COLONIES/mL  ESCHERICHIA COLI (A)  Final   Report Status 02/08/2022 FINAL  Final   Organism ID, Bacteria ESCHERICHIA COLI (A)  Final      Susceptibility   Escherichia coli - MIC*    AMPICILLIN <=2 SENSITIVE Sensitive     CEFAZOLIN <=4 SENSITIVE Sensitive     CEFEPIME <=0.12 SENSITIVE Sensitive     CEFTRIAXONE <=0.25 SENSITIVE Sensitive     CIPROFLOXACIN <=0.25 SENSITIVE Sensitive     GENTAMICIN <=1 SENSITIVE Sensitive     IMIPENEM <=0.25 SENSITIVE Sensitive     NITROFURANTOIN <=16 SENSITIVE Sensitive     TRIMETH/SULFA <=20 SENSITIVE Sensitive     AMPICILLIN/SULBACTAM <=2 SENSITIVE Sensitive     PIP/TAZO <=4 SENSITIVE Sensitive     * 10,000 COLONIES/mL ESCHERICHIA COLI  Blood Culture (routine x 2)     Status: Abnormal   Collection Time: 02/06/22 12:42 PM   Specimen: Right Antecubital; Blood  Result Value Ref Range Status   Specimen Description   Final    RIGHT ANTECUBITAL Performed at  [facility], [address] [place], [place] [address]    Special Requests   Final    BOTTLES DRAWN AEROBIC AND ANAEROBIC Blood Culture results may not be optimal due to an excessive volume of blood received in culture bottles Performed at [facility], [address], [place], [place] [address]    Culture  Setup Time   Final    GRAM NEGATIVE RODS ANAEROBIC BOTTLE ONLY Gram Stain Report Called to,Read Back By and Verified With: JACKSON N @ 0926 ON [id number] BY HENDERSON L GRAM NEGATIVE RODS AEROBIC BOTTLE ONLY RESULT PREVIOUSLY CALLED CRITICAL VALUE NOTED.  VALUE IS CONSISTENT WITH PREVIOUSLY REPORTED AND CALLED VALUE.    Culture (A)  Final    ESCHERICHIA COLI SUSCEPTIBILITIES PERFORMED ON PREVIOUS CULTURE WITHIN THE LAST 5 DAYS. Performed at [facility], [place] [address] [place], [place] [address]    Report Status 02/09/2022 FINAL  Final  Blood Culture (routine x 2)     Status: Abnormal   Collection Time: 02/06/22 12:53 PM   Specimen: Right Antecubital; Blood  Result Value Ref Range Status   Specimen Description   Final    RIGHT ANTECUBITAL Performed at [facility], [address], [place], [place] [address]    Special Requests   Final    BOTTLES DRAWN AEROBIC AND ANAEROBIC Blood Culture results may not be optimal due to an excessive volume of blood received in culture bottles Performed at [facility], [address] [place],  [address]    Culture  Setup Time   Final    GRAM NEGATIVE RODS IN BOTH AEROBIC AND ANAEROBIC BOTTLES Gram Stain Report Called to,Read Back By and Verified With: JACKSON @ [phone] ON [id number] BY HENDERSON L CRITICAL RESULT CALLED TO, READ BACK BY AND VERIFIED WITH: PHARMD STEVEN Lykens  02/07/22 '@1348'$  BY AB Performed at [facility], [place] [address], [place], [place] [address]    Culture ESCHERICHIA COLI (A)  Final   Report Status 02/09/2022 FINAL  Final   Organism ID, Bacteria ESCHERICHIA COLI  Final      Susceptibility   Escherichia coli -  MIC*    AMPICILLIN 4 SENSITIVE Sensitive     CEFAZOLIN <=4 SENSITIVE Sensitive     CEFEPIME <=0.12 SENSITIVE Sensitive     CEFTAZIDIME <=1 SENSITIVE Sensitive     CEFTRIAXONE <=0.25 SENSITIVE Sensitive     CIPROFLOXACIN <=0.25 SENSITIVE Sensitive     GENTAMICIN <=1 SENSITIVE Sensitive  IMIPENEM <=0.25 SENSITIVE Sensitive     TRIMETH/SULFA <=20 SENSITIVE Sensitive     AMPICILLIN/SULBACTAM <=2 SENSITIVE Sensitive     PIP/TAZO <=4 SENSITIVE Sensitive     * ESCHERICHIA COLI  Blood Culture ID Panel (Reflexed)     Status: Abnormal   Collection Time: 02/06/22 12:53 PM  Result Value Ref Range Status   Enterococcus faecalis NOT DETECTED NOT DETECTED Final   Enterococcus Faecium NOT DETECTED NOT DETECTED Final   Listeria monocytogenes NOT DETECTED NOT DETECTED Final   Staphylococcus species NOT DETECTED NOT DETECTED Final   Staphylococcus aureus (BCID) NOT DETECTED NOT DETECTED Final   Staphylococcus epidermidis NOT DETECTED NOT DETECTED Final   Staphylococcus lugdunensis NOT DETECTED NOT DETECTED Final   Streptococcus species NOT DETECTED NOT DETECTED Final   Streptococcus agalactiae NOT DETECTED NOT DETECTED Final   Streptococcus pneumoniae NOT DETECTED NOT DETECTED Final   Streptococcus pyogenes NOT DETECTED NOT DETECTED Final   A.calcoaceticus-baumannii NOT DETECTED NOT DETECTED Final   Bacteroides fragilis NOT DETECTED NOT DETECTED Final   Enterobacterales DETECTED (A) NOT DETECTED Final    Comment: Enterobacterales represent a large order of gram negative bacteria, not a single organism. CRITICAL RESULT CALLED TO, READ BACK BY AND VERIFIED WITH: PHARMD STEVEN Iliamna  02/07/22 '@1348'$  BY AB    Enterobacter cloacae complex NOT DETECTED NOT DETECTED Final   Escherichia coli DETECTED (A) NOT DETECTED Final    Comment: CRITICAL RESULT CALLED TO, READ BACK BY AND VERIFIED WITH: PHARMD STEVEN HURTH  02/07/22 '@1348'$  BY AB    Klebsiella aerogenes NOT DETECTED NOT DETECTED Final   Klebsiella  oxytoca NOT DETECTED NOT DETECTED Final   Klebsiella pneumoniae NOT DETECTED NOT DETECTED Final   Proteus species NOT DETECTED NOT DETECTED Final   Salmonella species NOT DETECTED NOT DETECTED Final   Serratia marcescens NOT DETECTED NOT DETECTED Final   Haemophilus influenzae NOT DETECTED NOT DETECTED Final   Neisseria meningitidis NOT DETECTED NOT DETECTED Final   Pseudomonas aeruginosa NOT DETECTED NOT DETECTED Final   Stenotrophomonas maltophilia NOT DETECTED NOT DETECTED Final   Candida albicans NOT DETECTED NOT DETECTED Final   Candida auris NOT DETECTED NOT DETECTED Final   Candida glabrata NOT DETECTED NOT DETECTED Final   Candida krusei NOT DETECTED NOT DETECTED Final   Candida parapsilosis NOT DETECTED NOT DETECTED Final   Candida tropicalis NOT DETECTED NOT DETECTED Final   Cryptococcus neoformans/gattii NOT DETECTED NOT DETECTED Final   CTX-M ESBL NOT DETECTED NOT DETECTED Final   Carbapenem resistance IMP NOT DETECTED NOT DETECTED Final   Carbapenem resistance KPC NOT DETECTED NOT DETECTED Final   Carbapenem resistance NDM NOT DETECTED NOT DETECTED Final   Carbapenem resist OXA 48 LIKE NOT DETECTED NOT DETECTED Final   Carbapenem resistance VIM NOT DETECTED NOT DETECTED Final    Comment: Performed at [facility], [address] [place], [place] [address]  Urine Culture     Status: None   Collection Time: 02/07/22  3:33 PM   Specimen: Urine, Cystoscope  Result Value Ref Range Status   Specimen Description   Final    URINE, CATHETERIZED RIGHT RENAL PELVIS Performed at [facility], [place] [address] [place], [place] [address]    Special Requests   Final    NONE Performed at [facility], [place] [address] [place], [place] [address]    Culture   Final    NO GROWTH Performed at [facility], [place] [address], [place], [place]  [address]    Report Status 02/09/2022 FINAL  Final    Time coordinating discharge: 35  minutes  Signed: Eagan Shifflett  [facility] 02/10/2022, 2:07 PM

## 2022-02-10 NOTE — Plan of Care (Signed)

## 2022-02-17 ENCOUNTER — Other Ambulatory Visit: Payer: Self-pay | Admitting: Urology

## 2022-02-17 ENCOUNTER — Encounter (HOSPITAL_COMMUNITY): Payer: Self-pay | Admitting: Hematology and Oncology

## 2022-02-19 ENCOUNTER — Other Ambulatory Visit: Payer: Self-pay

## 2022-02-19 ENCOUNTER — Inpatient Hospital Stay (HOSPITAL_COMMUNITY)
Admission: EM | Admit: 2022-02-19 | Discharge: 2022-02-22 | DRG: 690 | Disposition: A | Discharge status: Home / Self Care | Payer: 59 | Attending: Internal Medicine | Admitting: Internal Medicine

## 2022-02-19 ENCOUNTER — Encounter (HOSPITAL_COMMUNITY): Payer: Self-pay

## 2022-02-19 ENCOUNTER — Emergency Department (HOSPITAL_COMMUNITY): Payer: 59

## 2022-02-19 ENCOUNTER — Encounter (HOSPITAL_COMMUNITY): Payer: Self-pay | Admitting: Hematology and Oncology

## 2022-02-19 DIAGNOSIS — G894 Chronic pain syndrome: Secondary | ICD-10-CM | POA: Diagnosis present

## 2022-02-19 DIAGNOSIS — Z87442 Personal history of urinary calculi: Secondary | ICD-10-CM

## 2022-02-19 DIAGNOSIS — Z881 Allergy status to other antibiotic agents status: Secondary | ICD-10-CM | POA: Diagnosis not present

## 2022-02-19 DIAGNOSIS — Z8249 Family history of ischemic heart disease and other diseases of the circulatory system: Secondary | ICD-10-CM | POA: Diagnosis not present

## 2022-02-19 DIAGNOSIS — M797 Fibromyalgia: Secondary | ICD-10-CM | POA: Diagnosis present

## 2022-02-19 DIAGNOSIS — Z825 Family history of asthma and other chronic lower respiratory diseases: Secondary | ICD-10-CM

## 2022-02-19 DIAGNOSIS — I10 Essential (primary) hypertension: Secondary | ICD-10-CM | POA: Diagnosis present

## 2022-02-19 DIAGNOSIS — J449 Chronic obstructive pulmonary disease, unspecified: Secondary | ICD-10-CM | POA: Diagnosis present

## 2022-02-19 DIAGNOSIS — Z6841 Body Mass Index (BMI) 40.0 and over, adult: Secondary | ICD-10-CM | POA: Diagnosis not present

## 2022-02-19 DIAGNOSIS — D649 Anemia, unspecified: Secondary | ICD-10-CM | POA: Diagnosis present

## 2022-02-19 DIAGNOSIS — E66813 Obesity, class 3: Secondary | ICD-10-CM | POA: Diagnosis present

## 2022-02-19 DIAGNOSIS — N390 Urinary tract infection, site not specified: Secondary | ICD-10-CM | POA: Diagnosis present

## 2022-02-19 DIAGNOSIS — Z882 Allergy status to sulfonamides status: Secondary | ICD-10-CM

## 2022-02-19 DIAGNOSIS — Z833 Family history of diabetes mellitus: Secondary | ICD-10-CM | POA: Diagnosis not present

## 2022-02-19 DIAGNOSIS — N201 Calculus of ureter: Secondary | ICD-10-CM | POA: Diagnosis present

## 2022-02-19 DIAGNOSIS — E039 Hypothyroidism, unspecified: Secondary | ICD-10-CM | POA: Diagnosis present

## 2022-02-19 DIAGNOSIS — N136 Pyonephrosis: Principal | ICD-10-CM | POA: Diagnosis present

## 2022-02-19 DIAGNOSIS — Z91040 Latex allergy status: Secondary | ICD-10-CM

## 2022-02-19 DIAGNOSIS — E785 Hyperlipidemia, unspecified: Secondary | ICD-10-CM | POA: Diagnosis present

## 2022-02-19 DIAGNOSIS — R319 Hematuria, unspecified: Secondary | ICD-10-CM | POA: Diagnosis not present

## 2022-02-19 DIAGNOSIS — N202 Calculus of kidney with calculus of ureter: Secondary | ICD-10-CM | POA: Diagnosis not present

## 2022-02-19 DIAGNOSIS — Z9884 Bariatric surgery status: Secondary | ICD-10-CM | POA: Diagnosis not present

## 2022-02-19 DIAGNOSIS — Z91030 Bee allergy status: Secondary | ICD-10-CM | POA: Diagnosis not present

## 2022-02-19 DIAGNOSIS — E119 Type 2 diabetes mellitus without complications: Secondary | ICD-10-CM | POA: Diagnosis present

## 2022-02-19 DIAGNOSIS — Z8542 Personal history of malignant neoplasm of other parts of uterus: Secondary | ICD-10-CM

## 2022-02-19 DIAGNOSIS — N3090 Cystitis, unspecified without hematuria: Principal | ICD-10-CM

## 2022-02-19 DIAGNOSIS — Z880 Allergy status to penicillin: Secondary | ICD-10-CM

## 2022-02-19 DIAGNOSIS — Z90710 Acquired absence of both cervix and uterus: Secondary | ICD-10-CM | POA: Diagnosis not present

## 2022-02-19 DIAGNOSIS — Z841 Family history of disorders of kidney and ureter: Secondary | ICD-10-CM | POA: Diagnosis not present

## 2022-02-19 DIAGNOSIS — Z79899 Other long term (current) drug therapy: Secondary | ICD-10-CM

## 2022-02-19 DIAGNOSIS — Z20822 Contact with and (suspected) exposure to covid-19: Secondary | ICD-10-CM | POA: Diagnosis present

## 2022-02-19 DIAGNOSIS — R300 Dysuria: Secondary | ICD-10-CM

## 2022-02-19 LAB — DIFFERENTIAL
Abs Immature Granulocytes: 0.04 10*3/uL (ref 0.00–0.07)
Basophils Absolute: 0.1 10*3/uL (ref 0.0–0.1)
Basophils Relative: 1 %
Eosinophils Absolute: 0.3 10*3/uL (ref 0.0–0.5)
Eosinophils Relative: 2 %
Immature Granulocytes: 0 %
Lymphocytes Relative: 7 %
Lymphs Abs: 0.9 10*3/uL (ref 0.7–4.0)
Monocytes Absolute: 0.5 10*3/uL (ref 0.1–1.0)
Monocytes Relative: 4 %
Neutro Abs: 10.9 10*3/uL — ABNORMAL HIGH (ref 1.7–7.7)
Neutrophils Relative %: 86 %

## 2022-02-19 LAB — COMPREHENSIVE METABOLIC PANEL
ALT: 33 U/L (ref 0–44)
AST: 34 U/L (ref 15–41)
Albumin: 3.7 g/dL (ref 3.5–5.0)
Alkaline Phosphatase: 154 U/L — ABNORMAL HIGH (ref 38–126)
Anion gap: 6 (ref 5–15)
BUN: 13 mg/dL (ref 6–20)
CO2: 29 mmol/L (ref 22–32)
Calcium: 8.9 mg/dL (ref 8.9–10.3)
Chloride: 103 mmol/L (ref 98–111)
Creatinine, Ser: 1.09 mg/dL — ABNORMAL HIGH (ref 0.44–1.00)
GFR, Estimated: 60 mL/min — ABNORMAL LOW (ref >60)
Glucose, Bld: 119 mg/dL — ABNORMAL HIGH (ref 70–99)
Potassium: 4.4 mmol/L (ref 3.5–5.1)
Sodium: 138 mmol/L (ref 135–145)
Total Bilirubin: 0.7 mg/dL (ref 0.3–1.2)
Total Protein: 7.4 g/dL (ref 6.5–8.1)

## 2022-02-19 LAB — URINALYSIS, ROUTINE W REFLEX MICROSCOPIC
Bacteria, UA: NONE SEEN
Bilirubin Urine: NEGATIVE
Glucose, UA: NEGATIVE mg/dL
Ketones, ur: NEGATIVE mg/dL
Leukocytes,Ua: NEGATIVE
Nitrite: POSITIVE — AB
Protein, ur: 100 mg/dL — AB
RBC / HPF: >50 RBC/hpf — ABNORMAL HIGH (ref 0–5)
Specific Gravity, Urine: 1.02 (ref 1.005–1.030)
WBC, UA: >50 WBC/hpf — ABNORMAL HIGH (ref 0–5)
pH: 5 (ref 5.0–8.0)

## 2022-02-19 LAB — APTT: aPTT: 26 seconds (ref 24–36)

## 2022-02-19 LAB — CBC
HCT: 38.7 % (ref 36.0–46.0)
Hemoglobin: 11.5 g/dL — ABNORMAL LOW (ref 12.0–15.0)
MCH: 26.3 pg (ref 26.0–34.0)
MCHC: 29.7 g/dL — ABNORMAL LOW (ref 30.0–36.0)
MCV: 88.6 fL (ref 80.0–100.0)
Platelets: 417 10*3/uL — ABNORMAL HIGH (ref 150–400)
RBC: 4.37 MIL/uL (ref 3.87–5.11)
RDW: 14.8 % (ref 11.5–15.5)
WBC: 12.7 10*3/uL — ABNORMAL HIGH (ref 4.0–10.5)
nRBC: 0 % (ref 0.0–0.2)

## 2022-02-19 LAB — PROTIME-INR
INR: 1.1 (ref 0.8–1.2)
Prothrombin Time: 13.8 seconds (ref 11.4–15.2)

## 2022-02-19 LAB — RESP PANEL BY RT-PCR (FLU A&B, COVID) ARPGX2
Influenza A by PCR: NEGATIVE
Influenza B by PCR: NEGATIVE
SARS Coronavirus 2 by RT PCR: NEGATIVE

## 2022-02-19 LAB — LACTIC ACID, PLASMA: Lactic Acid, Venous: 1 mmol/L (ref 0.5–1.9)

## 2022-02-19 LAB — GLUCOSE, CAPILLARY
Glucose-Capillary: 144 mg/dL — ABNORMAL HIGH (ref 70–99)
Glucose-Capillary: 250 mg/dL — ABNORMAL HIGH (ref 70–99)

## 2022-02-19 MED ORDER — ONDANSETRON HCL 4 MG/2ML IJ SOLN: 4.0000 mg | Freq: Four times a day (QID) | INTRAMUSCULAR | Status: DC | PRN

## 2022-02-19 MED ORDER — SACCHAROMYCES BOULARDII 250 MG PO CAPS
250.0000 mg | ORAL_CAPSULE | Freq: Two times a day (BID) | ORAL | Status: DC
  Administered 2022-02-19 – 2022-02-22 (×6): 250 mg via ORAL
  Filled 2022-02-19 (×6): qty 1

## 2022-02-19 MED ORDER — SUMATRIPTAN SUCCINATE 50 MG PO TABS: 100.0000 mg | ORAL_TABLET | ORAL | Status: DC | PRN

## 2022-02-19 MED ORDER — ENOXAPARIN SODIUM 40 MG/0.4ML IJ SOSY: 40.0000 mg | PREFILLED_SYRINGE | INTRAMUSCULAR | Status: DC

## 2022-02-19 MED ORDER — VITAMIN B-12 1000 MCG PO TABS
1000.0000 ug | ORAL_TABLET | Freq: Every day | ORAL | Status: DC
  Administered 2022-02-19 – 2022-02-22 (×4): 1000 ug via ORAL
  Filled 2022-02-19 (×4): qty 1

## 2022-02-19 MED ORDER — ACETAMINOPHEN 650 MG RE SUPP: 650.0000 mg | Freq: Four times a day (QID) | RECTAL | Status: DC | PRN

## 2022-02-19 MED ORDER — INSULIN ASPART 100 UNIT/ML IJ SOLN
0.0000 [IU] | Freq: Three times a day (TID) | INTRAMUSCULAR | Status: DC
  Administered 2022-02-19: 1 [IU] via SUBCUTANEOUS
  Administered 2022-02-20: 2 [IU] via SUBCUTANEOUS
  Administered 2022-02-20: 1 [IU] via SUBCUTANEOUS
  Administered 2022-02-20: 2 [IU] via SUBCUTANEOUS

## 2022-02-19 MED ORDER — DICYCLOMINE HCL 10 MG PO CAPS: 20.0000 mg | ORAL_CAPSULE | Freq: Three times a day (TID) | ORAL | Status: DC | PRN

## 2022-02-19 MED ORDER — PANTOPRAZOLE SODIUM 40 MG PO TBEC
40.0000 mg | DELAYED_RELEASE_TABLET | Freq: Every day | ORAL | Status: DC
  Administered 2022-02-19 – 2022-02-22 (×4): 40 mg via ORAL
  Filled 2022-02-19 (×4): qty 1

## 2022-02-19 MED ORDER — LACTATED RINGERS IV SOLN: INTRAVENOUS | Status: AC

## 2022-02-19 MED ORDER — LIDOCAINE VISCOUS HCL 2 % MT SOLN: 5.0000 mL | Freq: Three times a day (TID) | OROMUCOSAL | Status: DC | PRN

## 2022-02-19 MED ORDER — ACETAMINOPHEN 325 MG PO TABS: 650.0000 mg | ORAL_TABLET | Freq: Four times a day (QID) | ORAL | Status: DC | PRN

## 2022-02-19 MED ORDER — ALPRAZOLAM 1 MG PO TABS: 1.0000 mg | ORAL_TABLET | Freq: Three times a day (TID) | ORAL | Status: DC | PRN

## 2022-02-19 MED ORDER — PHENAZOPYRIDINE HCL 100 MG PO TABS
200.0000 mg | ORAL_TABLET | Freq: Three times a day (TID) | ORAL | Status: DC | PRN
  Administered 2022-02-19 – 2022-02-21 (×6): 200 mg via ORAL
  Filled 2022-02-19 (×6): qty 2

## 2022-02-19 MED ORDER — METHOCARBAMOL 500 MG PO TABS
750.0000 mg | ORAL_TABLET | Freq: Three times a day (TID) | ORAL | Status: DC | PRN
  Administered 2022-02-20 – 2022-02-21 (×3): 750 mg via ORAL
  Filled 2022-02-19 (×3): qty 2

## 2022-02-19 MED ORDER — GABAPENTIN 400 MG PO CAPS
1200.0000 mg | ORAL_CAPSULE | Freq: Two times a day (BID) | ORAL | Status: DC
  Administered 2022-02-19 – 2022-02-22 (×6): 1200 mg via ORAL
  Filled 2022-02-19 (×6): qty 3

## 2022-02-19 MED ORDER — KETOROLAC TROMETHAMINE 30 MG/ML IJ SOLN
30.0000 mg | Freq: Once | INTRAMUSCULAR | Status: AC
  Administered 2022-02-19: 30 mg via INTRAVENOUS
  Filled 2022-02-19: qty 1

## 2022-02-19 MED ORDER — PNEUMOCOCCAL 20-VAL CONJ VACC 0.5 ML IM SUSY: 0.5000 mL | PREFILLED_SYRINGE | INTRAMUSCULAR | Status: DC

## 2022-02-19 MED ORDER — MAGIC MOUTHWASH: 5.0000 mL | Freq: Three times a day (TID) | ORAL | Status: DC | PRN

## 2022-02-19 MED ORDER — DIPHENHYDRAMINE HCL 25 MG PO CAPS
25.0000 mg | ORAL_CAPSULE | Freq: Every day | ORAL | Status: DC
  Administered 2022-02-19 – 2022-02-21 (×3): 25 mg via ORAL
  Filled 2022-02-19 (×3): qty 1

## 2022-02-19 MED ORDER — ONDANSETRON HCL 4 MG PO TABS: 4.0000 mg | ORAL_TABLET | Freq: Four times a day (QID) | ORAL | Status: DC | PRN

## 2022-02-19 MED ORDER — SUCRALFATE 1 G PO TABS
1.0000 g | ORAL_TABLET | Freq: Four times a day (QID) | ORAL | Status: DC
  Administered 2022-02-19 – 2022-02-22 (×9): 1 g via ORAL
  Filled 2022-02-19 (×10): qty 1

## 2022-02-19 MED ORDER — ALBUTEROL SULFATE (2.5 MG/3ML) 0.083% IN NEBU
2.5000 mg | INHALATION_SOLUTION | Freq: Four times a day (QID) | RESPIRATORY_TRACT | Status: DC | PRN
  Filled 2022-02-19: qty 3

## 2022-02-19 MED ORDER — OXYCODONE HCL 5 MG PO TABS
5.0000 mg | ORAL_TABLET | ORAL | Status: DC | PRN
  Administered 2022-02-19 – 2022-02-22 (×7): 5 mg via ORAL
  Filled 2022-02-19 (×7): qty 1

## 2022-02-19 MED ORDER — BUPROPION HCL ER (SR) 150 MG PO TB12
150.0000 mg | ORAL_TABLET | Freq: Two times a day (BID) | ORAL | Status: DC
  Administered 2022-02-19 – 2022-02-22 (×6): 150 mg via ORAL
  Filled 2022-02-19 (×6): qty 1

## 2022-02-19 MED ORDER — ENOXAPARIN SODIUM 60 MG/0.6ML IJ SOSY
60.0000 mg | PREFILLED_SYRINGE | INTRAMUSCULAR | Status: DC
  Administered 2022-02-19 – 2022-02-21 (×3): 60 mg via SUBCUTANEOUS
  Filled 2022-02-19 (×3): qty 0.6

## 2022-02-19 MED ORDER — HYDRALAZINE HCL 20 MG/ML IJ SOLN: 10.0000 mg | INTRAMUSCULAR | Status: DC | PRN

## 2022-02-19 MED ORDER — FAMOTIDINE 20 MG PO TABS
40.0000 mg | ORAL_TABLET | Freq: Every day | ORAL | Status: DC
  Administered 2022-02-19 – 2022-02-21 (×3): 40 mg via ORAL
  Filled 2022-02-19 (×3): qty 2

## 2022-02-19 MED ORDER — OXYCODONE-ACETAMINOPHEN 5-325 MG PO TABS
1.0000 | ORAL_TABLET | ORAL | Status: DC | PRN
  Administered 2022-02-19 – 2022-02-22 (×7): 1 via ORAL
  Filled 2022-02-19 (×7): qty 1

## 2022-02-19 MED ORDER — SIMVASTATIN 20 MG PO TABS
20.0000 mg | ORAL_TABLET | Freq: Every day | ORAL | Status: DC
  Administered 2022-02-19 – 2022-02-21 (×3): 20 mg via ORAL
  Filled 2022-02-19 (×3): qty 1

## 2022-02-19 MED ORDER — VORTIOXETINE HBR 20 MG PO TABS
20.0000 mg | ORAL_TABLET | Freq: Every day | ORAL | Status: DC
  Administered 2022-02-20 – 2022-02-21 (×2): 20 mg via ORAL
  Filled 2022-02-19 (×6): qty 1

## 2022-02-19 MED ORDER — SODIUM CHLORIDE 0.9 % IV SOLN
2.0000 g | Freq: Three times a day (TID) | INTRAVENOUS | Status: AC
  Administered 2022-02-19 – 2022-02-21 (×6): 2 g via INTRAVENOUS
  Filled 2022-02-19 (×7): qty 10

## 2022-02-19 MED ORDER — LAMOTRIGINE 100 MG PO TABS
200.0000 mg | ORAL_TABLET | Freq: Two times a day (BID) | ORAL | Status: DC
  Administered 2022-02-19 – 2022-02-22 (×6): 200 mg via ORAL
  Filled 2022-02-19 (×6): qty 2

## 2022-02-19 MED ORDER — OXYCODONE-ACETAMINOPHEN 10-325 MG PO TABS: 1.0000 | ORAL_TABLET | ORAL | Status: DC | PRN

## 2022-02-19 MED ORDER — ALBUTEROL SULFATE HFA 108 (90 BASE) MCG/ACT IN AERS: 2.0000 | INHALATION_SPRAY | Freq: Four times a day (QID) | RESPIRATORY_TRACT | Status: DC | PRN

## 2022-02-19 MED ORDER — INSULIN ASPART 100 UNIT/ML IJ SOLN
0.0000 [IU] | Freq: Every day | INTRAMUSCULAR | Status: DC
  Administered 2022-02-21: 3 [IU] via SUBCUTANEOUS

## 2022-02-19 MED ORDER — SODIUM CHLORIDE 0.9 % IV BOLUS
2000.0000 mL | Freq: Once | INTRAVENOUS | Status: AC
  Administered 2022-02-19: 1000 mL via INTRAVENOUS

## 2022-02-19 NOTE — ED Provider Notes (Signed)
[facility] EMERGENCY DEPARTMENT Provider Note   CSN: [id number] Arrival date & time: 02/19/22  0800     History {Add pertinent medical, surgical, social history, OB history to HPI:1} Chief Complaint  Patient presents with   Dysuria    Molly Berry is a 56 y.o. female.  Patient presents with fever and weakness.  Patient has a history of diabetes thyroid disease and kidney stones.  She has 2 stent placed for kidney stones   Dysuria      Home Medications Prior to Admission medications   Medication Sig Start Date End Date Taking? Authorizing Provider  albuterol (PROVENTIL HFA;VENTOLIN HFA) 108 (90 BASE) MCG/ACT inhaler Inhale 2 puffs into the lungs every 6 (six) hours as needed for shortness of breath or wheezing.    [provider]  ALPRAZolam Duanne Moron) 1 MG tablet Take 1 mg by mouth 3 (three) times daily as needed for anxiety or sleep.    [provider]  buPROPion (WELLBUTRIN SR) 150 MG 12 hr tablet Take 150 mg by mouth daily. 10/03/17   [provider]  cefdinir (OMNICEF) 300 MG capsule Take 1 capsule (300 mg total) by mouth every 12 (twelve) hours for 10 days. 02/10/22 02/20/22  Terrilee Croak, MD  cyanocobalamin 1000 MCG tablet Take 1 tablet (1,000 mcg total) by mouth daily. 02/11/22 05/12/22  Terrilee Croak, MD  dicyclomine (BENTYL) 20 MG tablet Take 20 mg by mouth 3 (three) times daily as needed for spasms. 03/30/21   [provider]  diphenhydrAMINE (BENADRYL) 25 mg capsule Take 25 mg by mouth at bedtime.    [provider]  esomeprazole (NEXIUM) 40 MG capsule Take 40 mg by mouth 2 (two) times daily before a meal. 12/11/21   [provider]  famotidine (PEPCID) 40 MG tablet Take 40 mg by mouth 2 (two) times daily.    [provider]  furosemide (LASIX) 20 MG tablet Take 20 mg by mouth daily. 02/15/14   [provider]  gabapentin (NEURONTIN) 600 MG tablet Take 600 mg by mouth See admin instructions. Take 1200  mg by mouth in the morning, and 1200 mg at night    [provider]  lamoTRIgine (LAMICTAL) 200 MG tablet Take 200 mg by mouth 2 (two) times daily.    [provider]  magic mouthwash (lidocaine, diphenhydrAMINE, alum & mag hydroxide) suspension Swish and spit 5 mLs 3 (three) times daily as needed for mouth pain. 02/10/22   Terrilee Croak, MD  methocarbamol (ROBAXIN) 750 MG tablet Take 750 mg by mouth 3 (three) times daily as needed for muscle spasms. 05/02/21   [provider]  metoprolol succinate (TOPROL-XL) 50 MG 24 hr tablet Take 50 mg by mouth daily. 12/10/16   [provider]  ondansetron (ZOFRAN) 8 MG tablet Take 8 mg by mouth every 8 (eight) hours as needed for nausea or vomiting.  11/24/13   [provider]  oxyCODONE-acetaminophen (PERCOCET) 10-325 MG tablet Take 1 tablet by mouth every 4 (four) hours as needed for pain. 07/16/21   [name], MD  phenazopyridine (PYRIDIUM) 200 MG tablet Take 1 tablet (200 mg total) by mouth 3 (three) times daily as needed for pain. 02/10/22   Terrilee Croak, MD  saccharomyces boulardii (FLORASTOR) 250 MG capsule Take 1 capsule (250 mg total) by mouth 2 (two) times daily for 14 days. 02/10/22 02/24/22  Terrilee Croak, MD  simvastatin (ZOCOR) 20 MG tablet Take 20 mg by mouth daily.  05/29/16   [provider]  sucralfate (CARAFATE) 1 g tablet Take 1 g by mouth 4 (four) times daily.    [provider]  SUMAtriptan (IMITREX) 100 MG tablet Take 100 mg by mouth every 2 (two) hours as needed for migraine. 07/12/16   [provider]  tiZANidine (ZANAFLEX) 4 MG tablet Take 4 mg by mouth at bedtime.    [provider]  Vitamin D, Ergocalciferol, (DRISDOL) 1.25 MG (50000 UNIT) CAPS capsule Take 50,000 Units by mouth once a week.    [provider]  vortioxetine HBr (TRINTELLIX) 20 MG TABS tablet Take 20 mg by mouth daily.    [provider]      Allergies    Bee venom,  Cephalexin, Cephalexin, Ciprofloxacin, Penicillins, Vancomycin, Carbamazepine, Cefotaxime, Sulfacetamide sodium, Adhesive [tape], Clindamycin, Latex, and Sulfa antibiotics    Review of Systems   Review of Systems  Genitourinary:  Positive for dysuria.    Physical Exam Updated Vital Signs BP 108/66   Pulse 62   Temp 99.2 F (37.3 C) (Oral)   Resp 11   Ht '5\' 4"'$  (1.626 m)   Wt 117 kg   LMP 05/06/2011   SpO2 98%   BMI 44.27 kg/m  Physical Exam  ED Results / Procedures / Treatments   Labs (all labs ordered are listed, but only abnormal results are displayed) Labs Reviewed  COMPREHENSIVE METABOLIC PANEL - Abnormal; Notable for the following components:      Result Value   Glucose, Bld 119 (*)    Creatinine, Ser 1.09 (*)    Alkaline Phosphatase 154 (*)    GFR, Estimated 60 (*)    All other components within normal limits  URINALYSIS, ROUTINE W REFLEX MICROSCOPIC - Abnormal; Notable for the following components:   Color, Urine AMBER (*)    APPearance HAZY (*)    Hgb urine dipstick MODERATE (*)    Protein, ur 100 (*)    Nitrite POSITIVE (*)    RBC / HPF >50 (*)    WBC, UA >50 (*)    All other components within normal limits  CBC - Abnormal; Notable for the following components:   WBC 12.7 (*)    Hemoglobin 11.5 (*)    MCHC 29.7 (*)    Platelets 417 (*)    All other components within normal limits  DIFFERENTIAL - Abnormal; Notable for the following components:   Neutro Abs 10.9 (*)    All other components within normal limits  RESP PANEL BY RT-PCR (FLU A&B, COVID) ARPGX2  CULTURE, BLOOD (ROUTINE X 2)  CULTURE, BLOOD (ROUTINE X 2)  URINE CULTURE  LACTIC ACID, PLASMA  PROTIME-INR  APTT  CBC WITH DIFFERENTIAL/PLATELET  POC URINE PREG, ED    EKG None  Radiology DG Chest Port 1 View  Result Date: 02/19/2022 CLINICAL DATA:  Questionable sepsis. Recent urinary stent placement with dysuria. EXAM: PORTABLE CHEST 1 VIEW COMPARISON:  Radiographs 02/06/2022 and  07/17/2021.  CT 03/29/2009. FINDINGS: 0848 hours. Stable cardiomegaly and mediastinal contours. There is a faint right suprahilar opacity which has mildly increased compared with the prior study. Atelectasis or scarring at both lung bases appears unchanged. No pneumothorax or significant pleural effusion. The bones appear stable. IMPRESSION: Mildly increased right suprahilar opacity which could reflect atelectasis or early infiltrate. Stable cardiomegaly and bibasilar atelectasis or scarring. Electronically Signed   By: Richardean Sale M.D.   On: 02/19/2022 09:03    Procedures Procedures  {Document cardiac monitor, telemetry assessment procedure when appropriate:1}  Medications Ordered  in ED Medications  aztreonam (AZACTAM) 2 g in sodium chloride 0.9 % 100 mL IVPB (0 g Intravenous Stopped 02/19/22 1004)  sodium chloride 0.9 % bolus 2,000 mL (1,000 mLs Intravenous New Bag/Given 02/19/22 0940)  ketorolac (TORADOL) 30 MG/ML injection 30 mg (30 mg Intravenous Given 02/19/22 0945)    ED Course/ Medical Decision Making/ A&P  CRITICAL CARE Performed by: Milton Ferguson Total critical care time: 45 minutes Critical care time was exclusive of separately billable procedures and treating other patients. Critical care was necessary to treat or prevent imminent or life-threatening deterioration. Critical care was time spent personally by me on the following activities: development of treatment plan with patient and/or surrogate as well as nursing, discussions with consultants, evaluation of patient's response to treatment, examination of patient, obtaining history from patient or surrogate, ordering and performing treatments and interventions, ordering and review of laboratory studies, ordering and review of radiographic studies, pulse oximetry and re-evaluation of patient's condition.   Urology called and they will see the patient.  They want medicine to admit                         Medical Decision  Making Amount and/or Complexity of Data Reviewed Labs: ordered. Radiology: ordered. ECG/medicine tests: ordered.  Risk Prescription drug management. Decision regarding hospitalization.   Patient with urinary tract infection and bilateral stents.  She will be admitted to medicine with urology consult  {Document critical care time when appropriate:1} {Document review of labs and clinical decision tools ie heart score, Chads2Vasc2 etc:1}  {Document your independent review of radiology images, and any outside records:1} {Document your discussion with family members, caretakers, and with consultants:1} {Document social determinants of health affecting pt's care:1} {Document your decision making why or why not admission, treatments were needed:1} Final Clinical Impression(s) / ED Diagnoses Final diagnoses:  Cystitis    Rx / DC Orders ED Discharge Orders     None

## 2022-02-19 NOTE — ED Triage Notes (Addendum)
Patient states she recently had urinary stent placed bilaterally and states woke up this am and had dysuria and took a percocet. Patient states she started feeling bad; checked her temperature and fever 101.3. Patient called a triage nurse and was told to come here.

## 2022-02-19 NOTE — Sepsis Progress Note (Signed)
Notified bedside nurse of need to administer antibiotics and fluid bolus.  

## 2022-02-19 NOTE — Sepsis Progress Note (Signed)
Code Sepsis protocol being monitored by eLink. 

## 2022-02-19 NOTE — ED Notes (Signed)
Delayed IV fluids and abx due to difficulty obtaining IV access.

## 2022-02-19 NOTE — H&P (Addendum)
Date: 02/19/2022               Patient Name:  Molly Berry MRN: [id number]  DOB: 10/15/1965 Age / Sex: 56 y.o., female   PCP: Nicholes Rough, PA-C              Medical Service: Internal Medicine Teaching Service              Attending Physician: Dr. Manuella Ghazi, Pratik D, DO    First Contact: Stanford Breed, MS     Second Contact: Dr. Heath Lark, DO                         Chief Complaint: Fever and painful urination  History of Present Illness: Molly Berry is a 56 year old female who is s/p bilateral ureteral stent placement on 02/07/2022 from nephrolithiasis with hydronephrosis and urosepsis bacteremia. Medical history also significant for DM2, HTN, HLD, COPD, CKD, fibromyalgia, hypothyroidism, chronic pain syndrome, opioid dependence, history of gastric bypass 2016, and uterine cancer 2013 s/p hysterectomy.   Patient presented to [facility] ED on 10/5 with complaint of fever, chills, nausea, vomiting, dysuria, and right flank pain. She was febrile at 103.1 F and UA showed 21-50 WBC's and was nitrite positive. CT the following day revealed ureteral calculi with hydronephrosis which prompted transfer to [facility] and subsequent bilateral ureteral stent placement. Blood culture at that time grew E.coli. Patient was treated with IV Aztreonam and was discharged with oral Omnicef on 10/9.  Today patient presents to the ED with subjective fever, dysuria, and flank pain. Patient states that symptoms initially improved after last hospitalization, but for the past few days, she has been experiencing malaise, fevers that have responded to Tylenol, intermittent flank pain that is described as a hot knife lasting seconds at a time/few times per day, and a painful feeling when attempting urination. She states she has been unable to urinate much the last few days. Patient states she has noticed discoloration in her urine but she has been taking Azo. Patient also endorses a few episodes of diarrhea that began  after taking antibiotics. Patient denies HA, n/v, chills, chest pain, dyspnea, or blood in stool.   ED Findings: Vitals are stable. WBC is 12.7. UA nitrite positive with > 50 WBC and RBC. Creatinine 1.09 (baseline). Chest X-ray shows mildly increased right suprahilar opacity which could reflect atelectasis or early infiltrate. Stable cardiomegaly and bibasilar atelectasis or scarring. UA and Blood cultures obtained and patient started on IV Aztreonam 2 g 8.  Meds:  Current Meds  Medication Sig   albuterol (PROVENTIL HFA;VENTOLIN HFA) 108 (90 BASE) MCG/ACT inhaler Inhale 2 puffs into the lungs every 6 (six) hours as needed for shortness of breath or wheezing.   ALPRAZolam (XANAX) 1 MG tablet Take 1 mg by mouth 3 (three) times daily as needed for anxiety or sleep.   buPROPion (WELLBUTRIN SR) 150 MG 12 hr tablet Take 150 mg by mouth 2 (two) times daily.   cefdinir (OMNICEF) 300 MG capsule Take 1 capsule (300 mg total) by mouth every 12 (twelve) hours for 10 days.   cyanocobalamin 1000 MCG tablet Take 1 tablet (1,000 mcg total) by mouth daily.   dicyclomine (BENTYL) 20 MG tablet Take 20 mg by mouth 3 (three) times daily as needed for spasms.   diphenhydrAMINE (BENADRYL) 25 mg capsule Take 25 mg by mouth at bedtime.   esomeprazole (NEXIUM) 40 MG capsule Take 40 mg by mouth 2 (two)  times daily before a meal.   famotidine (PEPCID) 40 MG tablet Take 40 mg by mouth at bedtime.   gabapentin (NEURONTIN) 600 MG tablet Take 600 mg by mouth See admin instructions. Take 1200 mg by mouth in the morning, and 1200 mg at night   lamoTRIgine (LAMICTAL) 200 MG tablet Take 200 mg by mouth 2 (two) times daily.   magic mouthwash (lidocaine, diphenhydrAMINE, alum & mag hydroxide) suspension Swish and spit 5 mLs 3 (three) times daily as needed for mouth pain.   methocarbamol (ROBAXIN) 750 MG tablet Take 750 mg by mouth 3 (three) times daily as needed for muscle spasms.   metoprolol succinate (TOPROL-XL) 50 MG 24 hr tablet  Take 50 mg by mouth daily.   ondansetron (ZOFRAN) 8 MG tablet Take 8 mg by mouth every 8 (eight) hours as needed for nausea or vomiting.    oxyCODONE-acetaminophen (PERCOCET) 10-325 MG tablet Take 1 tablet by mouth every 4 (four) hours as needed for pain.   phenazopyridine (PYRIDIUM) 200 MG tablet Take 1 tablet (200 mg total) by mouth 3 (three) times daily as needed for pain.   saccharomyces boulardii (FLORASTOR) 250 MG capsule Take 1 capsule (250 mg total) by mouth 2 (two) times daily for 14 days.   simvastatin (ZOCOR) 20 MG tablet Take 20 mg by mouth daily.    sucralfate (CARAFATE) 1 g tablet Take 1 g by mouth 4 (four) times daily.   SUMAtriptan (IMITREX) 100 MG tablet Take 100 mg by mouth every 2 (two) hours as needed for migraine.   tiZANidine (ZANAFLEX) 4 MG tablet Take 4 mg by mouth at bedtime.   Vitamin D, Ergocalciferol, (DRISDOL) 1.25 MG (50000 UNIT) CAPS capsule Take 50,000 Units by mouth once a week.   vortioxetine HBr (TRINTELLIX) 20 MG TABS tablet Take 20 mg by mouth daily.     Allergies: Allergies as of 02/19/2022 - Review Complete 02/19/2022  Allergen Reaction Noted   Bee venom Shortness Of Breath, Swelling, and Rash 02/07/2011   Cephalexin Anaphylaxis 01/12/2011   Cephalexin Anaphylaxis, Itching, and Swelling 01/12/2011   Ciprofloxacin Hives, Rash, and Swelling 09/10/2015   Penicillins Hives, Rash, Itching, and Swelling 01/12/2011   Vancomycin Anaphylaxis, Hives, and Itching 06/13/2013   Carbamazepine  10/26/2017   Cefotaxime Rash 03/09/2015   Sulfacetamide sodium Rash 03/12/2015   Adhesive [tape] Rash 04/14/2013   Clindamycin Rash 07/11/2015   Latex Rash 04/14/2013   Sulfa antibiotics Rash 01/15/2015   Past Medical History:  Diagnosis Date   Allergy    Anemia    Anxiety    Arthritis    knees, multiple joints   Asthma    Bell palsy    Blood transfusion without reported diagnosis    Breast mass    lt breast mass x's 3 years increased in size   Chronic kidney  disease    Chronic pain syndrome    COPD (chronic obstructive pulmonary disease) (HCC)    Depression    Diabetes mellitus    Endometrial cancer (HCC)    Fibromyalgia    GERD (gastroesophageal reflux disease)    H/O gastric bypass 05/09/2014   At [facility]   Headache    mirgraines   Hyperlipidemia    Hypertension    Hypothyroidism    Multiple pulmonary nodules 11/30/2012   Followed in Pulmonary clinic/ [facility]/ [place]  - See CT abd  10/29/12  New right lower lobe pulmonary nodularity, primarily ground-  glass in density. This could reflect an inflammatory process,  although  follow-up is necessary to exclude atypical neoplasm. Full  chest CT should be considered to evaluate for other pulmonary  findings.      Multiple thyroid nodules    Nephropathy    Pneumonia    PONV (postoperative nausea and vomiting)    Pulmonary nodules    Thyroid disease    Ulcer    Uterine cancer ([place]) 12/26/2011   Stage 1, grade 1, S/P hysterectomy initially by robotic technique and then salpingo-oophorectomy at [facility] receiving no postoperative treatment.    Family History  Problem Relation Age of Onset   Pneumonia Mother          Deceased   Arthritis Mother     Asthma Mother     Cancer Mother          pancreatic   COPD Mother     Depression Mother     Diabetes Mother     Kidney disease Mother     Liver cancer Father          Living   Arthritis Father     Cancer Father          liver, prostate   COPD Father     Depression Father     Stroke Father     Heart disease Maternal Grandmother     Mental illness Maternal Grandmother     Breast cancer Maternal Grandmother     Alcohol abuse Paternal Grandfather     Breast cancer Paternal Aunt     Anesthesia problems Neg Hx     Hypotension Neg Hx     Malignant hyperthermia Neg Hx     Pseudochol deficiency Neg Hx     Colon cancer Neg Hx       Social History: No tobacco, alcohol, or illegal drug use.  Review of Systems: A complete ROS was  negative except as per HPI.   Physical Exam: Blood pressure 108/66, pulse 62, temperature 99.2 F (37.3 C), temperature source Oral, resp. rate 11, height '5\' 4"'$  (1.626 m), weight 117 kg, last menstrual period 05/06/2011, SpO2 98 %.  General: lying in bed in no acute distress Head: Normocephalic, atraumatic Eyes: PERRLA, sclera non-itceric Mouth: normal dentition, moist mucus membranes without lesions Neck: supple, no lymphadenopathy Cardiovascular: RRR without murmurs, rubs, or gallops, No LEE Respiratory: Normal work of breathing, lungs CTAB Abdominal: protuberant but soft, normoactive bowel sounds, no tenderness to palpation, no flank tenderess Skin: Warm, dry Neurocognitive: Alert and oriented x 3 Psychiatric: Normal mood and affect   Assessment & Plan by Problem: Principal Problem:   UTI (urinary tract infection) Active Problems:   Ureteral calculus, left   Controlled type 2 diabetes mellitus without complication, without long-term current use of insulin (HCC)   Essential hypertension, benign   Fibromyalgia syndrome   HLD (hyperlipidemia)   Obesity, Class III, BMI 40-49.9 (morbid obesity) ([place])  Recurrent complicated UTI with recent E.coli bacteremia Patient has signs and symptoms consistent with persistent UTI. Patient still has 2 days left of 10 day Omnicef treatment (for a total of 14 days of antibiotics) but given current symptoms and extensive allergies to antibiotics, ID consulted for proper course of action. Furthermore, given recent history of ureteral stones and hemoglobinuria on UA, nephrolithiasis considered. Urology consulted. -Follow up with urology consult  DMT2 A1C from 02/07/22 was 6.4. Glucose on admission is 119. -Carb modified diet and SSI  Essential Hypertension BP stable. Resume Toprol-XL if patient becomes hypertensive, resume as tolerated. -Consider IV hydralazine  Fibromyalgia Percocet  and Xanax PRN  HLD Continue home regimen of Zocor 20  mg/daily  Dispo: Admit patient to Observation with expected length of stay less than 2 midnights.  Signed: Stanford Breed, Medical Student 02/19/2022, 1:46 PM

## 2022-02-20 DIAGNOSIS — R319 Hematuria, unspecified: Secondary | ICD-10-CM | POA: Diagnosis not present

## 2022-02-20 DIAGNOSIS — N202 Calculus of kidney with calculus of ureter: Secondary | ICD-10-CM | POA: Diagnosis not present

## 2022-02-20 DIAGNOSIS — N390 Urinary tract infection, site not specified: Secondary | ICD-10-CM | POA: Diagnosis not present

## 2022-02-20 DIAGNOSIS — R300 Dysuria: Secondary | ICD-10-CM | POA: Diagnosis not present

## 2022-02-20 LAB — URINE CULTURE: Culture: NO GROWTH

## 2022-02-20 LAB — COMPREHENSIVE METABOLIC PANEL
ALT: 23 U/L (ref 0–44)
AST: 21 U/L (ref 15–41)
Albumin: 3.2 g/dL — ABNORMAL LOW (ref 3.5–5.0)
Alkaline Phosphatase: 117 U/L (ref 38–126)
Anion gap: 7 (ref 5–15)
BUN: 15 mg/dL (ref 6–20)
CO2: 24 mmol/L (ref 22–32)
Calcium: 8.3 mg/dL — ABNORMAL LOW (ref 8.9–10.3)
Chloride: 108 mmol/L (ref 98–111)
Creatinine, Ser: 1.17 mg/dL — ABNORMAL HIGH (ref 0.44–1.00)
GFR, Estimated: 55 mL/min — ABNORMAL LOW (ref >60)
Glucose, Bld: 106 mg/dL — ABNORMAL HIGH (ref 70–99)
Potassium: 4.6 mmol/L (ref 3.5–5.1)
Sodium: 139 mmol/L (ref 135–145)
Total Bilirubin: 0.6 mg/dL (ref 0.3–1.2)
Total Protein: 6.3 g/dL — ABNORMAL LOW (ref 6.5–8.1)

## 2022-02-20 LAB — CBC
HCT: 36 % (ref 36.0–46.0)
Hemoglobin: 10 g/dL — ABNORMAL LOW (ref 12.0–15.0)
MCH: 25.9 pg — ABNORMAL LOW (ref 26.0–34.0)
MCHC: 27.8 g/dL — ABNORMAL LOW (ref 30.0–36.0)
MCV: 93.3 fL (ref 80.0–100.0)
Platelets: 331 10*3/uL (ref 150–400)
RBC: 3.86 MIL/uL — ABNORMAL LOW (ref 3.87–5.11)
RDW: 14.8 % (ref 11.5–15.5)
WBC: 5.8 10*3/uL (ref 4.0–10.5)
nRBC: 0 % (ref 0.0–0.2)

## 2022-02-20 LAB — MAGNESIUM: Magnesium: 2.3 mg/dL (ref 1.7–2.4)

## 2022-02-20 LAB — GLUCOSE, CAPILLARY
Glucose-Capillary: 130 mg/dL — ABNORMAL HIGH (ref 70–99)
Glucose-Capillary: 163 mg/dL — ABNORMAL HIGH (ref 70–99)
Glucose-Capillary: 156 mg/dL — ABNORMAL HIGH (ref 70–99)
Glucose-Capillary: 149 mg/dL — ABNORMAL HIGH (ref 70–99)

## 2022-02-20 MED ORDER — SODIUM CHLORIDE 0.9 % IV SOLN
2.0000 g | INTRAVENOUS | Status: DC
  Administered 2022-02-21 – 2022-02-22 (×2): 2 g via INTRAVENOUS
  Filled 2022-02-20 (×2): qty 20

## 2022-02-20 MED ORDER — METOPROLOL SUCCINATE ER 50 MG PO TB24
50.0000 mg | ORAL_TABLET | Freq: Every day | ORAL | Status: DC
  Administered 2022-02-20 – 2022-02-22 (×2): 50 mg via ORAL
  Filled 2022-02-20 (×3): qty 1

## 2022-02-20 MED ORDER — DOCUSATE SODIUM 100 MG PO CAPS
100.0000 mg | ORAL_CAPSULE | Freq: Every day | ORAL | Status: DC
  Administered 2022-02-21 – 2022-02-22 (×2): 100 mg via ORAL
  Filled 2022-02-20 (×2): qty 1

## 2022-02-20 MED ORDER — FERROUS SULFATE 325 (65 FE) MG PO TABS
325.0000 mg | ORAL_TABLET | Freq: Every day | ORAL | Status: DC
  Administered 2022-02-21 – 2022-02-22 (×2): 325 mg via ORAL
  Filled 2022-02-20 (×2): qty 1

## 2022-02-20 NOTE — Consult Note (Addendum)
[place] for Infectious Disease   I connected with  Molly Berry on 02/20/22 by a video enabled telemedicine application and verified that I am speaking with the correct person using two identifiers.   I discussed the limitations of evaluation and management by telemedicine. The patient expressed understanding and agreed to proceed.  Location: Patient - Inpatient [facility] hospital Physician - [facility]  Duration of visit:  47 minutes    Reason for Consult: possible UTI    Referring Physician: Dr. Manuella Ghazi  Principal Problem:   UTI (urinary tract infection) Active Problems:   Ureteral calculus, left   Controlled type 2 diabetes mellitus without complication, without long-term current use of insulin (HCC)   Essential hypertension, benign   Fibromyalgia syndrome   HLD (hyperlipidemia)   Obesity, Class III, BMI 40-49.9 (morbid obesity) (HCC)    buPROPion  150 mg Oral BID   cyanocobalamin  1,000 mcg Oral Daily   diphenhydrAMINE  25 mg Oral QHS   [START ON 02/21/2022] docusate sodium  100 mg Oral Daily   enoxaparin (LOVENOX) injection  60 mg Subcutaneous Q24H   famotidine  40 mg Oral QHS   [START ON 02/21/2022] ferrous sulfate  325 mg Oral Q breakfast   gabapentin  1,200 mg Oral BID   insulin aspart  0-5 Units Subcutaneous QHS   insulin aspart  0-9 Units Subcutaneous TID WC   lamoTRIgine  200 mg Oral BID   metoprolol succinate  50 mg Oral Daily   pantoprazole  40 mg Oral Daily   pneumococcal 20-valent conjugate vaccine  0.5 mL Intramuscular Tomorrow-1000   saccharomyces boulardii  250 mg Oral BID   simvastatin  20 mg Oral Daily   sucralfate  1 g Oral QID   vortioxetine HBr  20 mg Oral Daily    Recommendations: Continue IV antibiotics through Saturday am as long as she is doing well Will consider change to ceftriaxone tomorrow since she has tolerated cefdinir and it has no side chains similar to cephalexin Resume oral cefdinir for 5 days at  discharge  Assessment: She has recent stones with stent placement and  E coli infection and feeling poorly with fever to 101 and urinary symptoms concerning for reinfection.  I suspect this is related to having the stones in place and transient worsening rather than development of resistance.  I recommend she continue with IV aztreonam another 2 days if she is doing well then can resume oral cefdinir.   She has muliple allergies and she does recall signfiicant events with them so will continue to avoid her list.  Interestingly though, she does tolerate the current cephalosporin.    Antibiotics: aztreonam  HPI: Molly Berry is a 56 y.o. female with a history of nephrolithiasis s/p bilateral ureteral stents placed on 02/07/22 with bacteremia with pansensitive E coli represented with weakness, fatigue, new dysuria, increased frequency and fever to 101 at home.  Started on above antibiotics and now feeling better with less urinary symptoms.  She is eating ok and able to drink.  No other new concerns.    Review of Systems:  Constitutional: positive for fevers and chills All other systems reviewed and are negative    Past Medical History:  Diagnosis Date   Allergy    Anemia    Anxiety    Arthritis    knees, multiple joints   Asthma    Bell palsy    Blood transfusion without reported diagnosis    Breast  mass    lt breast mass x's 3 years increased in size   Chronic kidney disease    Chronic pain syndrome    COPD (chronic obstructive pulmonary disease) (HCC)    Depression    Diabetes mellitus    Endometrial cancer (HCC)    Fibromyalgia    GERD (gastroesophageal reflux disease)    H/O gastric bypass 05/09/2014   At [facility]   Headache    mirgraines   Hyperlipidemia    Hypertension    Hypothyroidism    Multiple pulmonary nodules 11/30/2012   Followed in Pulmonary clinic/ [facility]/ [place]  - See CT abd  10/29/12  New right lower lobe pulmonary nodularity, primarily ground-   glass in density. This could reflect an inflammatory process,  although follow-up is necessary to exclude atypical neoplasm. Full  chest CT should be considered to evaluate for other pulmonary  findings.      Multiple thyroid nodules    Nephropathy    Pneumonia    PONV (postoperative nausea and vomiting)    Pulmonary nodules    Thyroid disease    Ulcer    Uterine cancer ([place]) 12/26/2011   Stage 1, grade 1, S/P hysterectomy initially by robotic technique and then salpingo-oophorectomy at [facility] receiving no postoperative treatment.    Social History   Tobacco Use   Smoking status: Never   Smokeless tobacco: Never  Vaping Use   Vaping Use: Never used  Substance Use Topics   Alcohol use: No    Alcohol/week: 0.0 standard drinks of alcohol   Drug use: No    Family History  Problem Relation Age of Onset   Pneumonia Mother        Deceased   Arthritis Mother    Asthma Mother    Cancer Mother        pancreatic   COPD Mother    Depression Mother    Diabetes Mother    Kidney disease Mother    Liver cancer Father        Living   Arthritis Father    Cancer Father        liver, prostate   COPD Father    Depression Father    Stroke Father    Heart disease Maternal Grandmother    Mental illness Maternal Grandmother    Breast cancer Maternal Grandmother    Alcohol abuse Paternal Grandfather    Breast cancer Paternal Aunt    Anesthesia problems Neg Hx    Hypotension Neg Hx    Malignant hyperthermia Neg Hx    Pseudochol deficiency Neg Hx    Colon cancer Neg Hx     Allergies  Allergen Reactions   Bee Venom Shortness Of Breath, Swelling and Rash   Cephalexin Anaphylaxis   Cephalexin Anaphylaxis, Itching and Swelling   Ciprofloxacin Hives, Rash and Swelling    hives hives  hives   Penicillins Hives, Rash, Itching and Swelling    Has patient had a PCN reaction causing immediate rash, facial/tongue/throat swelling, SOB or lightheadedness with hypotension: Unknown Has  patient had a PCN reaction causing severe rash involving mucus membranes or skin necrosis: Unknown Has patient had a PCN reaction that required hospitalization: Unknown Has patient had a PCN reaction occurring within the last 10 years: No If all of the above answers are "NO", then may proceed with Cephalosporin use. Has patient had a PCN reaction causing immediate rash, facial/tongue/throat swelling, SOB or lightheadedness with hypotension: Unknown Has patient had a PCN reaction causing  severe rash involving mucus membranes or skin necrosis: Unknown Has patient had a PCN reaction that required hospitalization: Unknown Has patient had a PCN reaction occurring within the last 10 years: No If all of the above answers are "NO", then may proceed with Cephalosporin use.   Vancomycin Anaphylaxis, Hives and Itching    Has taken for bronchitis under medical supervision on site with benadryl Other reaction(s): Other (See Comments) Red man syndrome, gave benadryl to help, per RN.  Slow down infusion with future dose.  Has taken for bronchitis under medical supervision on site with benadryl   Carbamazepine     Loss of balance, trembling, inability to think, resulting in ER visit shortly after start of course  Other reaction(s): Other (See Comments) Speech, mind slows down, cant walk  Loss of balance, trembling, inability to think, resulting in ER visit shortly after start of course   Cefotaxime Rash    Other reaction(s): Other (See Comments)   Sulfacetamide Sodium Rash   Adhesive [Tape] Rash   Clindamycin Rash   Latex Rash   Sulfa Antibiotics Rash    Physical Exam: Constitutional: in no apparent distress  Vitals:   02/20/22 0505 02/20/22 1243  BP: (!) 141/61 124/62  Pulse: 60 62  Resp: 20 19  Temp: 97.6 F (36.4 C) 98.2 F (36.8 C)  SpO2: 93% 97%   EYES: anicteric   Lab Results  Component Value Date   WBC 5.8 02/20/2022   HGB 10.0 (L) 02/20/2022   HCT 36.0 02/20/2022   MCV 93.3  02/20/2022   PLT 331 02/20/2022    Lab Results  Component Value Date   CREATININE 1.17 (H) 02/20/2022   BUN 15 02/20/2022   NA 139 02/20/2022   K 4.6 02/20/2022   CL 108 02/20/2022   CO2 24 02/20/2022    Lab Results  Component Value Date   ALT 23 02/20/2022   AST 21 02/20/2022   ALKPHOS 117 02/20/2022     Microbiology: Recent Results (from the past 240 hour(s))  Urine Culture     Status: None   Collection Time: 02/19/22  8:39 AM   Specimen: Urine, Catheterized  Result Value Ref Range Status   Specimen Description   Final    URINE, CATHETERIZED Performed at [facility], [address] [place], [place] [address]    Special Requests   Final    NONE Performed at [facility], [address] [place], [place] [address]    Culture   Final    NO GROWTH Performed at [facility], [place] [address] [place], [place] [address]    Report Status 02/20/2022 FINAL  Final  Blood Culture (routine x 2)     Status: None (Preliminary result)   Collection Time: 02/19/22  9:01 AM   Specimen: Right Antecubital; Blood  Result Value Ref Range Status   Specimen Description RIGHT ANTECUBITAL  Final   Special Requests   Final    BOTTLES DRAWN AEROBIC AND ANAEROBIC Blood Culture adequate volume   Culture   Final    NO GROWTH < 24 HOURS Performed at [facility], [address], [place], [place] [address]    Report Status PENDING  Incomplete  Blood Culture (routine x 2)     Status: None (Preliminary result)   Collection Time: 02/19/22  9:05 AM   Specimen: Left Antecubital; Blood  Result Value Ref Range Status   Specimen Description LEFT ANTECUBITAL  Final   Special Requests   Final  BOTTLES DRAWN AEROBIC AND ANAEROBIC Blood Culture adequate volume   Culture   Final    NO GROWTH < 24 HOURS Performed at [facility], [address], [place], [place] [address]    Report Status PENDING  Incomplete  Resp Panel by RT-PCR (Flu A&B, Covid) Anterior Nasal Swab     Status: None    Collection Time: 02/19/22  9:20 AM   Specimen: Anterior Nasal Swab  Result Value Ref Range Status   SARS Coronavirus 2 by RT PCR NEGATIVE NEGATIVE Final    Comment: (NOTE) SARS-CoV-2 target nucleic acids are NOT DETECTED.  The SARS-CoV-2 RNA is generally detectable in upper respiratory specimens during the acute phase of infection. The lowest concentration of SARS-CoV-2 viral copies this assay can detect is 138 copies/mL. A negative result does not preclude SARS-Cov-2 infection and should not be used as the sole basis for treatment or other patient management decisions. A negative result may occur with  improper specimen collection/handling, submission of specimen other than nasopharyngeal swab, presence of viral mutation(s) within the areas targeted by this assay, and inadequate number of viral copies(<138 copies/mL). A negative result must be combined with clinical observations, patient history, and epidemiological information. The expected result is Negative.  Fact Sheet for Patients:  EntrepreneurPulse.com.au  Fact Sheet for Healthcare Providers:  IncredibleEmployment.be  This test is no t yet approved or cleared by the [place] FDA and  has been authorized for detection and/or diagnosis of SARS-CoV-2 by FDA under an Emergency Use Authorization (EUA). This EUA will remain  in effect (meaning this test can be used) for the duration of the COVID-19 declaration under Section 564(b)(1) of the Act, 21 U.S.C.section 360bbb-3(b)(1), unless the authorization is terminated  or revoked sooner.       Influenza A by PCR NEGATIVE NEGATIVE Final   Influenza B by PCR NEGATIVE NEGATIVE Final    Comment: (NOTE) The Xpert Xpress SARS-CoV-2/FLU/RSV plus assay is intended as an aid in the diagnosis of influenza from Nasopharyngeal swab specimens and should not be used as a sole basis for treatment. Nasal washings and aspirates are unacceptable for  Xpert Xpress SARS-CoV-2/FLU/RSV testing.  Fact Sheet for Patients: EntrepreneurPulse.com.au  Fact Sheet for Healthcare Providers: IncredibleEmployment.be  This test is not yet approved or cleared by the [place] FDA and has been authorized for detection and/or diagnosis of SARS-CoV-2 by FDA under an Emergency Use Authorization (EUA). This EUA will remain in effect (meaning this test can be used) for the duration of the COVID-19 declaration under Section 564(b)(1) of the Act, 21 U.S.C. section 360bbb-3(b)(1), unless the authorization is terminated or revoked.  Performed at [facility], [address], [place], [place] [address]     Marlow Berenguer W Natassia Guthridge, [place] for Infectious Disease [place] [email or web address] 02/20/2022, 3:33 PM

## 2022-02-20 NOTE — TOC Initial Note (Signed)
Transition of Care [facility]) - Initial/Assessment Note    Patient Details  Name: Molly Berry MRN: [id number] Date of Birth: Sep 17, 1965  Transition of [facility]) CM/SW Contact:    Shade Flood, LCSW Phone Number: 02/20/2022, 10:34 AM  Clinical Narrative:                  Pt admitted from home. She was recently hospitalized and returned home at dc. Per MD, pt has cultures pending which will then determine if she will need oral vs IV anbx at dc. Pt resides with her husband and is independent in ADLs. TOC will follow and assist as needed with DC planning.  Expected Discharge Plan: Home/Self Care Barriers to Discharge: Continued Medical Work up   Patient Goals and CMS Choice        Expected Discharge Plan and Services Expected Discharge Plan: Home/Self Care In-house Referral: Clinical Social Work     Living arrangements for the past 2 months: Single Family Home                                      Prior Living Arrangements/Services Living arrangements for the past 2 months: Single Family Home Lives with:: Spouse Patient language and need for interpreter reviewed:: Yes Do you feel safe going back to the place where you live?: Yes      Need for Family Participation in Patient Care: No (Comment) Care giver support system in place?: Yes (comment)   Criminal Activity/Legal Involvement Pertinent to Current Situation/Hospitalization: No - Comment as needed  Activities of Daily Living Home Assistive Devices/Equipment: None ADL Screening (condition at time of admission) Patient's cognitive ability adequate to safely complete daily activities?: Yes Is the patient deaf or have difficulty hearing?: No Does the patient have difficulty seeing, even when wearing glasses/contacts?: No Does the patient have difficulty concentrating, remembering, or making decisions?: No Patient able to express need for assistance with ADLs?: Yes Does the patient have difficulty dressing or  bathing?: No Independently performs ADLs?: Yes (appropriate for developmental age) Does the patient have difficulty walking or climbing stairs?: No Weakness of Legs: Both Weakness of Arms/Hands: None  Permission Sought/Granted                  Emotional Assessment       Orientation: : Oriented to Self, Oriented to Place, Oriented to  Time, Oriented to Situation Alcohol / Substance Use: Not Applicable Psych Involvement: No (comment)  Admission diagnosis:  UTI (urinary tract infection) [N39.0] Cystitis [N30.90] Patient Active Problem List   Diagnosis Date Noted   UTI (urinary tract infection) 02/19/2022   Elevated d-dimer 02/07/2022   Gram-negative bacteremia 02/07/2022   Acute pyelonephritis 02/07/2022   AKI (acute kidney injury) ([place]) 02/07/2022   Gram negative sepsis ([place]) 02/07/2022   Obesity, Class III, BMI 40-49.9 (morbid obesity) ([place]) 02/06/2022   Sepsis due to undetermined organism ([place]) 02/06/2022   Hypoxia 02/06/2022   Opioid dependence ([place]) 02/06/2022   Osteoarthritis of knees, bilateral 12/02/2021   Pain in right wrist 11/07/2021   Pain in left wrist 11/07/2021   Complete tear of left rotator cuff 07/23/2021   Bee sting allergy 07/07/2016   Attention deficit disorder (ADD) in adult 07/07/2016   Chronic insomnia 07/07/2016   Vitamin D deficiency 07/07/2016   Migraine syndrome 07/07/2016   Osteoarthritis of both knees 07/07/2016   Fibromyalgia syndrome 07/07/2016   S/P gastric  bypass 07/07/2016   HLD (hyperlipidemia) 07/07/2016   Morbid obesity due to excess calories ([place]) 03/10/2015   Controlled type 2 diabetes mellitus without complication, without long-term current use of insulin ([place]) 03/09/2015   Nontoxic multinodular goiter 03/09/2015   Essential hypertension, benign 03/09/2015   H/O gastric bypass 05/09/2014   IDA (iron deficiency anemia) 12/27/2013   Ureteral calculus, left 11/17/2013   SOB (shortness of breath) 11/30/2012   PCP:  Nicholes Rough, PA-C Pharmacy:   [place], [address] [place] [address] Phone: [phone] Fax: [phone]     Social Determinants of Health (SDOH) Interventions    Readmission Risk Interventions    02/20/2022   10:33 AM  Readmission Risk Prevention Plan  Transportation Screening Complete  HRI or [place] Complete  Social Work Consult for [place] Planning/Counseling Complete  Palliative Care Screening Not Applicable  Medication Review Press photographer) Complete

## 2022-02-20 NOTE — Consult Note (Signed)
Urology Consult  Referring physician: Dr. Manuella Ghazi Reason for referral: Sepsis, bilateral stent in place  Chief Complaint: Bilateral flank pain  History of Present Illness: Molly Berry is a 56yo with a history of nephrolithiasis who presented to the ER with a 2-3 day history fever and fatigue. She underwent bilateral ureteral stent placement 2 weeks ago for sepsis from bilateral ureteral/renal calculi. She was discharged on 10/9 on omincef. She notes intermittent mild to moderated bilateral flank pain that is worse with urination. She has associated nausea with the pain but no vomiting. She has urinary frequency, urgency and dysuria. She has intermittent gross hematuria. Urine culture obtained in ER and is currently pending. WBC count 5.6. No fevers in the past 24 hours.   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 This could reflect an inflammatory process,  although follow-up is necessary to exclude atypical neoplasm. Full  chest CT should be considered to evaluate for other pulmonary  findings.      Multiple thyroid nodules    Nephropathy    Pneumonia    PONV (postoperative nausea and vomiting)    Pulmonary nodules    Thyroid disease    Ulcer     Uterine cancer ([place]) 12/26/2011   Stage 1, grade 1, S/P hysterectomy initially by robotic technique and then salpingo-oophorectomy at [facility] receiving no postoperative treatment.   Past Surgical History:  Procedure Laterality Date   ABDOMINAL HYSTERECTOMY     endometrial cancer   CHOLECYSTECTOMY     COLONOSCOPY WITH PROPOFOL N/A 01/19/2014   [id number]   CYSTOSCOPY W/ URETERAL STENT PLACEMENT Bilateral 02/07/2022   Procedure: CYSTOSCOPY WITH RETROGRADE PYELOGRAM/RIGHT URETERAL STENT PLACEMENT WITH POSSIBLE LEFT;  Surgeon: Janith Lima, MD;  Location: [facility];  Service: Urology;  Laterality: Bilateral;   DILATION AND CURETTAGE OF UTERUS  12 yrs ago   DILATION AND CURETTAGE OF UTERUS  06/18/2011   Procedure: DILATATION AND CURETTAGE;  Surgeon: Florian Buff, MD;  Location: [facility];  Service: Gynecology;  Laterality: N/A;  Suction Dilation and Curettage   ESOPHAGOGASTRODUODENOSCOPY N/A 10/12/2013   Dr. Rourk:anastomotic ulcer likely cause of bleeding. likely ischemic    ESOPHAGOGASTRODUODENOSCOPY (EGD) WITH PROPOFOL N/A 01/19/2014   QGB:EEFEOF   EXTRACORPOREAL SHOCK WAVE LITHOTRIPSY Left 01/11/2018   Procedure: LEFT EXTRACORPOREAL SHOCK WAVE LITHOTRIPSY (ESWL);  Surgeon: Lucas Mallow, MD;  Location: [facility];  Service: Urology;  Laterality: Left;   EXTRACORPOREAL SHOCK WAVE LITHOTRIPSY Left 01/14/2018   Procedure: LEFT EXTRACORPOREAL SHOCK WAVE LITHOTRIPSY (ESWL);  Surgeon: Cleon Gustin, MD;  Location: [facility];  Service: Urology;  Laterality: Left;  14 MINS  W/ MAC   GASTRIC BYPASS  2013   [facility]   HYSTEROSCOPY WITH D & C  06/18/2011   Procedure: DILATATION AND CURETTAGE /HYSTEROSCOPY;  Surgeon: Florian Buff, MD;  Location: [facility];  Service: Gynecology;  Laterality: N/A;   LITHOTRIPSY     paniculectomy     SHOULDER ARTHROSCOPY WITH SUBACROMIAL DECOMPRESSION AND OPEN ROTATOR C Left 07/16/2021   Procedure: LEFT ARTHROSCOPIC SUBACROMIAL DECOMPRESSION, MINI OPEN ROTATOR CUFF  TEAR REPAIR, BICEPS TENOTOMY;  Surgeon: [name], MD;  Location: [facility];  Service: Orthopedics;  Laterality: Left;   TRIGGER FINGER RELEASE     WISDOM TOOTH EXTRACTION      Medications: I have reviewed the patient's current medications. Allergies:  Allergies  Allergen Reactions   Bee Venom Shortness Of Breath, Swelling and Rash   Cephalexin Anaphylaxis   Cephalexin Anaphylaxis, Itching and Swelling   Ciprofloxacin Hives, Rash and Swelling    hives hives  hives   Penicillins Hives, Rash, Itching and Swelling    Has patient had a PCN reaction causing immediate rash, facial/tongue/throat swelling, SOB or lightheadedness with hypotension: Unknown Has patient had a PCN reaction causing severe rash involving mucus membranes or skin necrosis: Unknown Has patient had a PCN reaction that required hospitalization: Unknown Has patient had a PCN reaction occurring within the last 10 years: No If all of the above answers are "NO", then may proceed with Cephalosporin use. Has patient had a PCN reaction causing immediate rash, facial/tongue/throat swelling, SOB or lightheadedness with hypotension: Unknown Has patient had a PCN reaction causing severe rash involving mucus membranes or skin necrosis: Unknown Has patient had a PCN reaction that required hospitalization: Unknown Has patient had a PCN reaction occurring within the last 10 years: No If all of the above answers are "NO", then may proceed with Cephalosporin use.   Vancomycin Anaphylaxis, Hives and Itching    Has taken for bronchitis under medical supervision on site with benadryl Other reaction(s): Other (See Comments) Red man syndrome, gave benadryl to help, per RN.  Slow down infusion with future dose.  Has taken for bronchitis under medical supervision on site with benadryl   Carbamazepine     Loss of balance, trembling, inability to think, resulting in ER visit shortly after start of course  Other reaction(s): Other (See  Comments) Speech, mind slows down, cant walk  Loss of balance, trembling, inability to think, resulting in ER visit shortly after start of course   Cefotaxime Rash    Other reaction(s): Other (See Comments)   Sulfacetamide Sodium Rash   Adhesive [Tape] Rash   Clindamycin Rash   Latex Rash   Sulfa Antibiotics Rash    Family History  Problem Relation Age of Onset   Pneumonia Mother        Deceased   Arthritis Mother    Asthma Mother    Cancer Mother        pancreatic   COPD Mother    Depression Mother    Diabetes Mother    Kidney disease Mother    Liver cancer Father        Living   Arthritis Father    Cancer Father        liver, prostate   COPD Father    Depression Father    Stroke Father    Heart disease Maternal Grandmother    Mental illness Maternal Grandmother    Breast cancer  Maternal Grandmother    Alcohol abuse Paternal Grandfather    Breast cancer Paternal Aunt    Anesthesia problems Neg Hx    Hypotension Neg Hx    Malignant hyperthermia Neg Hx    Pseudochol deficiency Neg Hx    Colon cancer Neg Hx    Social History:  reports that she has never smoked. She has never used smokeless tobacco. She reports that she does not drink alcohol and does not use drugs.  Review of Systems  Physical Exam:  Vital signs in last 24 hours: Temp:  [97.5 F (36.4 C)-98.8 F (37.1 C)] 98.2 F (36.8 C) (10/19 1243) Pulse Rate:  [59-89] 62 (10/19 1243) Resp:  [13-20] 19 (10/19 1243) BP: (113-141)/(52-81) 124/62 (10/19 1243) SpO2:  [91 %-97 %] 97 % (10/19 1243) Physical Exam Vitals reviewed.  Constitutional:      Appearance: Normal appearance.  HENT:     Head: Normocephalic and atraumatic.     Mouth/Throat:     Mouth: Mucous membranes are dry.  Eyes:     Extraocular Movements: Extraocular movements intact.     Pupils: Pupils are equal, round, and reactive to light.  Cardiovascular:     Rate and Rhythm: Normal rate and regular rhythm.  Pulmonary:     Effort:  Pulmonary effort is normal. No respiratory distress.  Abdominal:     General: Abdomen is flat. There is no distension.  Musculoskeletal:        General: No swelling. Normal range of motion.     Cervical back: Normal range of motion and neck supple.  Neurological:     General: No focal deficit present.     Mental Status: She is alert and oriented to person, place, and time.  Psychiatric:        Mood and Affect: Mood normal.        Behavior: Behavior normal.        Thought Content: Thought content normal.        Judgment: Judgment normal.     Laboratory Data:  Results for orders placed or performed during the hospital encounter of 02/19/22 (from the past 72 hour(s))  Urinalysis, Routine w reflex microscopic Urine, In & Out Cath     Status: Abnormal   Collection Time: 02/19/22  8:39 AM  Result Value Ref Range   Color, Urine AMBER (A) YELLOW    Comment: BIOCHEMICALS MAY BE AFFECTED BY COLOR   APPearance HAZY (A) CLEAR   Specific Gravity, Urine 1.020 1.005 - 1.030   pH 5.0 5.0 - 8.0   Glucose, UA NEGATIVE NEGATIVE mg/dL   Hgb urine dipstick MODERATE (A) NEGATIVE   Bilirubin Urine NEGATIVE NEGATIVE   Ketones, ur NEGATIVE NEGATIVE mg/dL   Protein, ur 100 (A) NEGATIVE mg/dL   Nitrite POSITIVE (A) NEGATIVE   Leukocytes,Ua NEGATIVE NEGATIVE   RBC / HPF >50 (H) 0 - 5 RBC/hpf   WBC, UA >50 (H) 0 - 5 WBC/hpf   Bacteria, UA NONE SEEN NONE SEEN   Squamous Epithelial / LPF 0-5 0 - 5   WBC Clumps PRESENT    Mucus PRESENT    Uric Acid Crys, UA PRESENT     Comment: Performed at [facility], [address], [place], [place] [address]  Urine Culture     Status: None   Collection Time: 02/19/22  8:39 AM   Specimen: Urine, Catheterized  Result Value Ref Range   Specimen Description      URINE, CATHETERIZED Performed at [facility],  [address] [place], [place] [address]    Special Requests      NONE Performed at [facility], [address], [place], [place] [address]    Culture       NO GROWTH Performed at [facility] [address] [place], [place] [address]    Report Status 02/20/2022 FINAL   Blood Culture (routine x 2)     Status: None (Preliminary result)   Collection Time: 02/19/22  9:01 AM   Specimen: Right Antecubital; Blood  Result Value Ref Range   Specimen Description RIGHT ANTECUBITAL    Special Requests      BOTTLES DRAWN AEROBIC AND ANAEROBIC Blood Culture adequate volume   Culture      NO GROWTH < 24 HOURS Performed at [facility], [address], [place], [place] [address]    Report Status PENDING   Comprehensive metabolic panel     Status: Abnormal   Collection Time: 02/19/22  9:05 AM  Result Value Ref Range   Sodium 138 135 - 145 mmol/L   Potassium 4.4 3.5 - 5.1 mmol/L   Chloride 103 98 - 111 mmol/L   CO2 29 22 - 32 mmol/L   Glucose, Bld 119 (H) 70 - 99 mg/dL    Comment: Glucose reference range applies only to samples taken after fasting for at least 8 hours.   BUN 13 6 - 20 mg/dL   Creatinine, Ser 1.09 (H) 0.44 - 1.00 mg/dL   Calcium 8.9 8.9 - 10.3 mg/dL   Total Protein 7.4 6.5 - 8.1 g/dL   Albumin 3.7 3.5 - 5.0 g/dL   AST 34 15 - 41 U/L   ALT 33 0 - 44 U/L   Alkaline Phosphatase 154 (H) 38 - 126 U/L   Total Bilirubin 0.7 0.3 - 1.2 mg/dL   GFR, Estimated 60 (L) >60 mL/min    Comment: (NOTE) Calculated using the CKD-EPI Creatinine Equation (2021)    Anion gap 6 5 - 15    Comment: Performed at [facility], [address] [place], [place] [address]  Protime-INR     Status: None   Collection Time: 02/19/22  9:05 AM  Result Value Ref Range   Prothrombin Time 13.8 11.4 - 15.2 seconds   INR 1.1 0.8 - 1.2    Comment: (NOTE) INR goal varies based on device and disease states. Performed at [facility], [address], [place], [place] [address]   APTT     Status: None   Collection Time: 02/19/22  9:05 AM  Result Value Ref Range   aPTT 26 24 - 36 seconds    Comment: Performed at [facility], [address]  [place], [place] [address]  Blood Culture (routine x 2)     Status: None (Preliminary result)   Collection Time: 02/19/22  9:05 AM   Specimen: Left Antecubital; Blood  Result Value Ref Range   Specimen Description LEFT ANTECUBITAL    Special Requests      BOTTLES DRAWN AEROBIC AND ANAEROBIC Blood Culture adequate volume   Culture      NO GROWTH < 24 HOURS Performed at [facility], [address] [place], [place] [address]    Report Status PENDING   Lactic acid, plasma     Status: None   Collection Time: 02/19/22  9:06 AM  Result Value Ref Range   Lactic Acid, Venous 1.0 0.5 - 1.9 mmol/L    Comment: Performed at [facility], [address] [place], [place]  [address]  CBC     Status: Abnormal   Collection Time: 02/19/22  9:17 AM  Result Value Ref Range   WBC 12.7 (H) 4.0 - 10.5 K/uL   RBC 4.37 3.87 - 5.11 MIL/uL   Hemoglobin 11.5 (L) 12.0 - 15.0 g/dL   HCT 38.7 36.0 - 46.0 %   MCV 88.6 80.0 - 100.0 fL   MCH 26.3 26.0 - 34.0 pg   MCHC 29.7 (L) 30.0 - 36.0 g/dL   RDW 14.8 11.5 - 15.5 %   Platelets 417 (H) 150 - 400 K/uL   nRBC 0.0 0.0 - 0.2 %    Comment: Performed at [facility], [address], [place], [place] [address]  Differential     Status: Abnormal   Collection Time: 02/19/22  9:17 AM  Result Value Ref Range   Neutrophils Relative % 86 %   Neutro Abs 10.9 (H) 1.7 - 7.7 K/uL   Lymphocytes Relative 7 %   Lymphs Abs 0.9 0.7 - 4.0 K/uL   Monocytes Relative 4 %   Monocytes Absolute 0.5 0.1 - 1.0 K/uL   Eosinophils Relative 2 %   Eosinophils Absolute 0.3 0.0 - 0.5 K/uL   Basophils Relative 1 %   Basophils Absolute 0.1 0.0 - 0.1 K/uL   Immature Granulocytes 0 %   Abs Immature Granulocytes 0.04 0.00 - 0.07 K/uL    Comment: Performed at [facility], [address], [place], [place] [address]  Resp Panel by RT-PCR (Flu A&B, Covid) Anterior Nasal Swab     Status: None   Collection Time: 02/19/22  9:20 AM   Specimen: Anterior Nasal Swab  Result Value Ref Range   SARS  Coronavirus 2 by RT PCR NEGATIVE NEGATIVE    Comment: (NOTE) SARS-CoV-2 target nucleic acids are NOT DETECTED.  The SARS-CoV-2 RNA is generally detectable in upper respiratory specimens during the acute phase of infection. The lowest concentration of SARS-CoV-2 viral copies this assay can detect is 138 copies/mL. A negative result does not preclude SARS-Cov-2 infection and should not be used as the sole basis for treatment or other patient management decisions. A negative result may occur with  improper specimen collection/handling, submission of specimen other than nasopharyngeal swab, presence of viral mutation(s) within the areas targeted by this assay, and inadequate number of viral copies(<138 copies/mL). A negative result must be combined with clinical observations, patient history, and epidemiological information. The expected result is Negative.  Fact Sheet for Patients:  EntrepreneurPulse.com.au  Fact Sheet for Healthcare Providers:  IncredibleEmployment.be  This test is no t yet approved or cleared by the [place] FDA and  has been authorized for detection and/or diagnosis of SARS-CoV-2 by FDA under an Emergency Use Authorization (EUA). This EUA will remain  in effect (meaning this test can be used) for the duration of the COVID-19 declaration under Section 564(b)(1) of the Act, 21 U.S.C.section 360bbb-3(b)(1), unless the authorization is terminated  or revoked sooner.       Influenza A by PCR NEGATIVE NEGATIVE   Influenza B by PCR NEGATIVE NEGATIVE    Comment: (NOTE) The Xpert Xpress SARS-CoV-2/FLU/RSV plus assay is intended as an aid in the diagnosis of influenza from Nasopharyngeal swab specimens and should not be used as a sole basis for treatment. Nasal washings and aspirates are unacceptable for Xpert Xpress SARS-CoV-2/FLU/RSV testing.  Fact Sheet for Patients: EntrepreneurPulse.com.au  Fact Sheet  for Healthcare Providers: IncredibleEmployment.be  This test is not yet approved or cleared by the [place] FDA  and has been authorized for detection and/or diagnosis of SARS-CoV-2 by FDA under an Emergency Use Authorization (EUA). This EUA will remain in effect (meaning this test can be used) for the duration of the COVID-19 declaration under Section 564(b)(1) of the Act, 21 U.S.C. section 360bbb-3(b)(1), unless the authorization is terminated or revoked.  Performed at [facility], [address] [place], [place] [address]   Glucose, capillary     Status: Abnormal   Collection Time: 02/19/22  4:58 PM  Result Value Ref Range   Glucose-Capillary 144 (H) 70 - 99 mg/dL    Comment: Glucose reference range applies only to samples taken after fasting for at least 8 hours.  Glucose, capillary     Status: Abnormal   Collection Time: 02/19/22  8:53 PM  Result Value Ref Range   Glucose-Capillary 250 (H) 70 - 99 mg/dL    Comment: Glucose reference range applies only to samples taken after fasting for at least 8 hours.  Magnesium     Status: None   Collection Time: 02/20/22  4:30 AM  Result Value Ref Range   Magnesium 2.3 1.7 - 2.4 mg/dL    Comment: Performed at [facility], [address], [place], [place] [address]  Comprehensive metabolic panel     Status: Abnormal   Collection Time: 02/20/22  4:30 AM  Result Value Ref Range   Sodium 139 135 - 145 mmol/L   Potassium 4.6 3.5 - 5.1 mmol/L   Chloride 108 98 - 111 mmol/L   CO2 24 22 - 32 mmol/L   Glucose, Bld 106 (H) 70 - 99 mg/dL    Comment: Glucose reference range applies only to samples taken after fasting for at least 8 hours.   BUN 15 6 - 20 mg/dL   Creatinine, Ser 1.17 (H) 0.44 - 1.00 mg/dL   Calcium 8.3 (L) 8.9 - 10.3 mg/dL   Total Protein 6.3 (L) 6.5 - 8.1 g/dL   Albumin 3.2 (L) 3.5 - 5.0 g/dL   AST 21 15 - 41 U/L   ALT 23 0 - 44 U/L   Alkaline Phosphatase 117 38 - 126 U/L   Total Bilirubin 0.6 0.3 -  1.2 mg/dL   GFR, Estimated 55 (L) >60 mL/min    Comment: (NOTE) Calculated using the CKD-EPI Creatinine Equation (2021)    Anion gap 7 5 - 15    Comment: Performed at [facility], [address], [place], [place] [address]  CBC     Status: Abnormal   Collection Time: 02/20/22  4:30 AM  Result Value Ref Range   WBC 5.8 4.0 - 10.5 K/uL   RBC 3.86 (L) 3.87 - 5.11 MIL/uL   Hemoglobin 10.0 (L) 12.0 - 15.0 g/dL   HCT 36.0 36.0 - 46.0 %   MCV 93.3 80.0 - 100.0 fL   MCH 25.9 (L) 26.0 - 34.0 pg   MCHC 27.8 (L) 30.0 - 36.0 g/dL   RDW 14.8 11.5 - 15.5 %   Platelets 331 150 - 400 K/uL   nRBC 0.0 0.0 - 0.2 %    Comment: Performed at [facility], [address], [place], [place] [address]  Glucose, capillary     Status: Abnormal   Collection Time: 02/20/22  7:32 AM  Result Value Ref Range   Glucose-Capillary 130 (H) 70 - 99 mg/dL    Comment: Glucose reference range applies only to samples taken after fasting for at least 8 hours.  Glucose, capillary     Status: Abnormal  Collection Time: 02/20/22 11:32 AM  Result Value Ref Range   Glucose-Capillary 163 (H) 70 - 99 mg/dL    Comment: Glucose reference range applies only to samples taken after fasting for at least 8 hours.   Recent Results (from the past 240 hour(s))  Urine Culture     Status: None   Collection Time: 02/19/22  8:39 AM   Specimen: Urine, Catheterized  Result Value Ref Range Status   Specimen Description   Final    URINE, CATHETERIZED Performed at [facility], [address] [place], [place] [address]    Special Requests   Final    NONE Performed at [facility], [address], [place], [place] [address]    Culture   Final    NO GROWTH Performed at [facility], [place] [address], [place], [place] [address]    Report Status 02/20/2022 FINAL  Final  Blood Culture (routine x 2)     Status: None (Preliminary result)   Collection Time: 02/19/22  9:01 AM   Specimen: Right Antecubital; Blood  Result Value Ref Range  Status   Specimen Description RIGHT ANTECUBITAL  Final   Special Requests   Final    BOTTLES DRAWN AEROBIC AND ANAEROBIC Blood Culture adequate volume   Culture   Final    NO GROWTH < 24 HOURS Performed at [facility], [address] [place], [place] [address]    Report Status PENDING  Incomplete  Blood Culture (routine x 2)     Status: None (Preliminary result)   Collection Time: 02/19/22  9:05 AM   Specimen: Left Antecubital; Blood  Result Value Ref Range Status   Specimen Description LEFT ANTECUBITAL  Final   Special Requests   Final    BOTTLES DRAWN AEROBIC AND ANAEROBIC Blood Culture adequate volume   Culture   Final    NO GROWTH < 24 HOURS Performed at [facility], [address] [place], [place] [address]    Report Status PENDING  Incomplete  Resp Panel by RT-PCR (Flu A&B, Covid) Anterior Nasal Swab     Status: None   Collection Time: 02/19/22  9:20 AM   Specimen: Anterior Nasal Swab  Result Value Ref Range Status   SARS Coronavirus 2 by RT PCR NEGATIVE NEGATIVE Final    Comment: (NOTE) SARS-CoV-2 target nucleic acids are NOT DETECTED.  The SARS-CoV-2 RNA is generally detectable in upper respiratory specimens during the acute phase of infection. The lowest concentration of SARS-CoV-2 viral copies this assay can detect is 138 copies/mL. A negative result does not preclude SARS-Cov-2 infection and should not be used as the sole basis for treatment or other patient management decisions. A negative result may occur with  improper specimen collection/handling, submission of specimen other than nasopharyngeal swab, presence of viral mutation(s) within the areas targeted by this assay, and inadequate number of viral copies(<138 copies/mL). A negative result must be combined with clinical observations, patient history, and epidemiological information. The expected result is Negative.  Fact Sheet for Patients:  EntrepreneurPulse.com.au  Fact Sheet for  Healthcare Providers:  IncredibleEmployment.be  This test is no t yet approved or cleared by the [place] FDA and  has been authorized for detection and/or diagnosis of SARS-CoV-2 by FDA under an Emergency Use Authorization (EUA). This EUA will remain  in effect (meaning this test can be used) for the duration of the COVID-19 declaration under Section 564(b)(1) of the Act, 21 U.S.C.section 360bbb-3(b)(1), unless the authorization is terminated  or revoked  sooner.       Influenza A by PCR NEGATIVE NEGATIVE Final   Influenza B by PCR NEGATIVE NEGATIVE Final    Comment: (NOTE) The Xpert Xpress SARS-CoV-2/FLU/RSV plus assay is intended as an aid in the diagnosis of influenza from Nasopharyngeal swab specimens and should not be used as a sole basis for treatment. Nasal washings and aspirates are unacceptable for Xpert Xpress SARS-CoV-2/FLU/RSV testing.  Fact Sheet for Patients: EntrepreneurPulse.com.au  Fact Sheet for Healthcare Providers: IncredibleEmployment.be  This test is not yet approved or cleared by the [place] FDA and has been authorized for detection and/or diagnosis of SARS-CoV-2 by FDA under an Emergency Use Authorization (EUA). This EUA will remain in effect (meaning this test can be used) for the duration of the COVID-19 declaration under Section 564(b)(1) of the Act, 21 U.S.C. section 360bbb-3(b)(1), unless the authorization is terminated or revoked.  Performed at [facility], [address] [place], [place] [address]    Creatinine: Recent Labs    02/19/22 1834 02/20/22 0430  CREATININE 1.09* 1.17*   Baseline Creatinine: 1  Impression/Assessment:  56yo with bilateral renal/ureteral calculi and UTI   Plan:  Please continue broad spectrum antibiotics pending urine culture. She will require at least 1 week of antibiotics prior to her ureteroscopic stone extraction. Her surgery will tentatively  be scheduled for 11/2.   Molly Berry 02/20/2022, 2:31 PM

## 2022-02-20 NOTE — Progress Notes (Addendum)
   Subjective:  Patient states she is feeling much better than yesterday. She is still experiencing some pain with urination but has not had any more flank pain since yesterday morning. Denies fever, chills, and n/v. No acute events overnight.  Objective:  Vital signs in last 24 hours: Vitals:   02/19/22 1535 02/19/22 1546 02/19/22 2048 02/20/22 0505  BP: (!) 133/56  (!) 121/52 (!) 141/61  Pulse: (!) 59  89 60  Resp: '16  20 20  '$ Temp:  98.8 F (37.1 C) (!) 97.5 F (36.4 C) 97.6 F (36.4 C)  TempSrc:  Oral Oral   SpO2: 95%  97% 93%  Weight:      Height:       Weight change:   Intake/Output Summary (Last 24 hours) at 02/20/2022 0855 Last data filed at 02/20/2022 5009 Gross per 24 hour  Intake 2273.55 ml  Output 200 ml  Net 2073.55 ml   General: obese, lying in bed in no acute distress Head: Normocephalic, atraumatic Cardiovascular: RRR without murmurs, rubs, or gallops, No LEE Respiratory: Normal work of breathing, lungs CTAB Abdominal: protuberant but soft, normoactive bowel sounds, no tenderness to palpation, no flank tenderess Skin: Warm, dry Neurocognitive: Alert and oriented x 3 Psychiatric: Normal mood and affect   Assessment/Plan:  Principal Problem:   UTI (urinary tract infection) Active Problems:   Ureteral calculus, left   Controlled type 2 diabetes mellitus without complication, without long-term current use of insulin (HCC)   Essential hypertension, benign   Fibromyalgia syndrome   HLD (hyperlipidemia)   Obesity, Class III, BMI 40-49.9 (morbid obesity) (HCC)   Recurrent complicated UTI with recent E.coli bacteremia Patient continues to endorse mild dysuria but she is afebrile and WBC is currently 5.8 (12.7 yesterday upon admission). UA from yesterday was nitrite positive with > 50 WBC and RBC. Urine culture is pending. Blood culture shows no growth (< 24 hours). Patient is on day 2 of Azactam. Given recent hospitalization for UTI/Urosepsis, plan would  be for at least 14 days of antibiotics.This plan will be updated after urine culture results and subsequent ID consult. Urology consulted and does not see an immediate need for further intervention. -CTM vitals and CBC -Continue Azactam   DMT2 A1C from 02/07/22 was 6.4. CBG currently 130. -Carb modified diet and SSI -Avoid SGLT2 inhibitor and consider changing home regimen of Sitagliptin given known association with UTI/Urosepsis   Essential Hypertension Patient initially presented with soft BP's so home Toprol-XL held. BP's have been trending up so we will resume Toprol-XL 50 mg/daily.  -Consider IV hydralazine as needed for significant elevation  Normocytic Anemia  Patient's Hgb currently 10.0 (11.5 yesterday). Anemia panel from 02/09/22 showed Iron level of 18. Patient states she has had anemia since she was 15. Takes ferrous sulfate as an outpatient but has not taken it in over a week. Will order ferrous sulfate PO 325 mg/daily -CTM CBC   Fibromyalgia -Continue home medications. Percocet and Xanax PRN   HLD Continue home regimen of Zocor 20 mg/daily  Morbid Obesity -BMI greater than 44 -Lifestyle changes outpatient  LOS: 2 Days  Stanford Breed, Medical Student 02/20/2022, 8:55 AM

## 2022-02-20 NOTE — Progress Notes (Signed)
Patient stated she still has burning with urination. Urine is discolored from medications Vital signs have remained stable.

## 2022-02-21 DIAGNOSIS — N390 Urinary tract infection, site not specified: Secondary | ICD-10-CM | POA: Diagnosis not present

## 2022-02-21 DIAGNOSIS — N201 Calculus of ureter: Secondary | ICD-10-CM

## 2022-02-21 LAB — CBC
HCT: 35.3 % — ABNORMAL LOW (ref 36.0–46.0)
Hemoglobin: 10.1 g/dL — ABNORMAL LOW (ref 12.0–15.0)
MCH: 25.9 pg — ABNORMAL LOW (ref 26.0–34.0)
MCHC: 28.6 g/dL — ABNORMAL LOW (ref 30.0–36.0)
MCV: 90.5 fL (ref 80.0–100.0)
Platelets: 364 10*3/uL (ref 150–400)
RBC: 3.9 MIL/uL (ref 3.87–5.11)
RDW: 14.9 % (ref 11.5–15.5)
WBC: 5.8 10*3/uL (ref 4.0–10.5)
nRBC: 0 % (ref 0.0–0.2)

## 2022-02-21 LAB — MAGNESIUM: Magnesium: 2.2 mg/dL (ref 1.7–2.4)

## 2022-02-21 LAB — BASIC METABOLIC PANEL
Anion gap: 5 (ref 5–15)
BUN: 13 mg/dL (ref 6–20)
CO2: 28 mmol/L (ref 22–32)
Calcium: 8.5 mg/dL — ABNORMAL LOW (ref 8.9–10.3)
Chloride: 107 mmol/L (ref 98–111)
Creatinine, Ser: 0.87 mg/dL (ref 0.44–1.00)
GFR, Estimated: >60 mL/min (ref >60)
Glucose, Bld: 93 mg/dL (ref 70–99)
Potassium: 4.8 mmol/L (ref 3.5–5.1)
Sodium: 140 mmol/L (ref 135–145)

## 2022-02-21 LAB — GLUCOSE, CAPILLARY
Glucose-Capillary: 140 mg/dL — ABNORMAL HIGH (ref 70–99)
Glucose-Capillary: 127 mg/dL — ABNORMAL HIGH (ref 70–99)
Glucose-Capillary: 112 mg/dL — ABNORMAL HIGH (ref 70–99)
Glucose-Capillary: 261 mg/dL — ABNORMAL HIGH (ref 70–99)

## 2022-02-21 NOTE — Progress Notes (Signed)
Initial Nutrition Assessment  DOCUMENTATION CODES:      INTERVENTION:  Heart Healthy/CHO modified diet   Nutrition handout -AVS  NUTRITION DIAGNOSIS:   Increased nutrient needs related to acute illness as evidenced by estimated needs.   GOAL:  Patient will meet greater than or equal to 90% of their needs   MONITOR:  PO intake, Labs, Weight trends  REASON FOR ASSESSMENT:   Malnutrition Screening Tool    ASSESSMENT: Patient is a 56 yo female with UTI, DM2, HTN, Fibromyalgia. Recurrent UTI with E.coli bacteremia.   Patient meal intake 75% x1 yesterday. Patient eating a large chef salad from [employer] during RD visit. Home diet regular. No appetite change reported. Patient says, I am going home tomorrow.   Weight encounters reviewed- range of 117-120 kg this year.   Medications reviewed and include: colace, pepcid, insulin, iron, B-12, protonix     Latest Ref Rng & Units 02/21/2022    4:58 AM 02/20/2022    4:30 AM 02/19/2022    9:05 AM  BMP  Glucose 70 - 99 mg/dL 93  106  119   BUN 6 - 20 mg/dL '13  15  13   '$ Creatinine 0.44 - 1.00 mg/dL 0.87  1.17  1.09   Sodium 135 - 145 mmol/L 140  139  138   Potassium 3.5 - 5.1 mmol/L 4.8  4.6  4.4   Chloride 98 - 111 mmol/L 107  108  103   CO2 22 - 32 mmol/L '28  24  29   '$ Calcium 8.9 - 10.3 mg/dL 8.5  8.3  8.9       NUTRITION - FOCUSED PHYSICAL EXAM:  Flowsheet Row Most Recent Value  Orbital Region No depletion  Upper Arm Region No depletion  Thoracic and Lumbar Region No depletion  Buccal Region No depletion  Temple Region No depletion  Clavicle Bone Region No depletion  Clavicle and Acromion Bone Region No depletion  Scapular Bone Region No depletion  Dorsal Hand No depletion  Hair Reviewed  Eyes Reviewed  Skin Reviewed       Diet Order:   Diet Order             Diet heart healthy/carb modified Room service appropriate? Yes; Fluid consistency: Thin  Diet effective now                    EDUCATION NEEDS:  Education needs have been addressed  Skin:  Skin Assessment: Reviewed RN Assessment  Last BM:  10/18  Height:   Ht Readings from Last 1 Encounters:  02/19/22 '5\' 4"'$  (1.626 m)    Weight:   Wt Readings from Last 1 Encounters:  02/19/22 117 kg    Ideal Body Weight:   55 kg  BMI:  Body mass index is 44.27 kg/m.  Estimated Nutritional Needs:   Kcal:  1800-2000  Protein:  110-116 gr  Fluid:  1.8-2.0 liters daily  Colman Cater MS,RD,CSG,LDN Contact: Shea Evans

## 2022-02-21 NOTE — Plan of Care (Signed)
  Problem: Education: Goal: Knowledge of General Education information will improve Description Including pain rating scale, medication(s)/side effects and non-pharmacologic comfort measures Outcome: Progressing   Problem: Health Behavior/Discharge Planning: Goal: Ability to manage health-related needs will improve Outcome: Progressing   

## 2022-02-21 NOTE — Progress Notes (Signed)
    [facility] for [facility]   Reason for visit: Follow up on UTI  Interval History: received ceftriaxone and tolerated  Physical Exam: Constitutional:  Vitals:   02/20/22 2202 02/21/22 0521  BP: (!) 115/56 102/62  Pulse: 67 (!) 51  Resp: 20 18  Temp: 97.8 F (36.6 C) (!) 96.8 F (36 C)  SpO2: 96% 96%    Impression: UTI with stents.  Negative urine culture.   Plan: At this point, I recommend one further dose of ceftriaxone tomorrow then can restart the cefdinir on Sunday and continue for 5 days. No new further infection noted.  Remains afebrile and without leukocytosis.   Please call/chat with any new questions or concerns.

## 2022-02-21 NOTE — Progress Notes (Signed)
   Subjective:  Patient is feeling much better today and states she is urinating without pain or difficulty. Patient states she did have a transient episode of mild flank pain. No acute events overnight.   Objective:  Vital signs in last 24 hours: Vitals:   02/20/22 0505 02/20/22 1243 02/20/22 2202 02/21/22 0521  BP: (!) 141/61 124/62 (!) 115/56 102/62  Pulse: 60 62 67 (!) 51  Resp: '20 19 20 18  '$ Temp: 97.6 F (36.4 C) 98.2 F (36.8 C) 97.8 F (36.6 C) (!) 96.8 F (36 C)  TempSrc:  Oral Oral   SpO2: 93% 97% 96% 96%  Weight:      Height:       Weight change:   Intake/Output Summary (Last 24 hours) at 02/21/2022 1214 Last data filed at 02/21/2022 7591 Gross per 24 hour  Intake 580 ml  Output 1 ml  Net 579 ml   Physical Exam  General: obese, lying in bed in no acute distress Head: Normocephalic, atraumatic Cardiovascular: Regular rate and rhythm without murmurs, rubs, or gallops, No LEE Respiratory: Normal work of breathing, lungs CTAB Abdominal: protuberant but soft, normoactive bowel sounds, no tenderness to palpation, no flank tenderess Skin: Warm, dry Neurocognitive: Alert and oriented x 3 Psychiatric: Normal mood and affect   Assessment/Plan:  Principal Problem:   UTI (urinary tract infection) Active Problems:   Ureteral calculus, left   Controlled type 2 diabetes mellitus without complication, without long-term current use of insulin (HCC)   Essential hypertension, benign   Fibromyalgia syndrome   HLD (hyperlipidemia)   Obesity, Class III, BMI 40-49.9 (morbid obesity) (HCC)   Recurrent complicated UTI with recent E.coli bacteremia Patient's dysuria has significantly improved. She is afebrile and WBC is currently 5.8 which is unchanged from yesterday. UA from 10/18 was nitrite positive with > 50 WBC and RBC. Urine culture came back negative . Blood culture shows no growth after 2 days. Patient had been receiving Azactam since admission, but per ID consult,  was transitioned to Rocephin this morning. ID further recommended an additional dose of Rocephin tomorrow then 5 days of Omnicef upon discharge. Urology consulted and is tentatively planning for stone extraction on 11/2. -Continue to monitor vitals and CBC -Continue Rocephin  DMT2 A1C from 02/07/22 was 6.4. CBG currently 127. -Carb modified diet and SSI -Avoid SGLT2 inhibitor and consider changing home regimen of Sitagliptin given known association with UTI/Urosepsis   Essential Hypertension Stable. Patient taking home regimen of Toprol-XL   Normocytic Anemia  Patient's Hgb currently 10.1 (10.0 yesterday). Anemia panel from 02/09/22 showed Iron level of 18. Patient states she has had anemia since she was 15. Takes ferrous sulfate as an outpatient. Ferrous sulfate 325 mg/daily initiated yesterday. -Continue to monitor CBC   Fibromyalgia -Continue home medications. Percocet and Xanax PRN   HLD Continue home regimen of Zocor 20 mg/daily   Morbid Obesity -BMI greater than 44 -Lifestyle changes outpatient  LOS: 3 Days  Stanford Breed, Medical Student 02/21/2022, 12:14 PM

## 2022-02-22 DIAGNOSIS — N390 Urinary tract infection, site not specified: Secondary | ICD-10-CM | POA: Diagnosis not present

## 2022-02-22 DIAGNOSIS — R319 Hematuria, unspecified: Secondary | ICD-10-CM | POA: Diagnosis not present

## 2022-02-22 MED ORDER — CEFDINIR 300 MG PO CAPS: 300.0000 mg | ORAL_CAPSULE | Freq: Two times a day (BID) | ORAL | 0 refills | Status: AC

## 2022-02-22 MED ORDER — DOCUSATE SODIUM 100 MG PO CAPS: 100.0000 mg | ORAL_CAPSULE | Freq: Every day | ORAL | 0 refills | Status: DC

## 2022-02-22 MED ORDER — FERROUS SULFATE 325 (65 FE) MG PO TABS: 325.0000 mg | ORAL_TABLET | Freq: Every day | ORAL | 0 refills | Status: DC

## 2022-02-22 NOTE — Progress Notes (Signed)
Patient discharged home today, transported home by family. Discharge paperwork went over with patient, patient verbalized understanding. Belongings sent home with patient.  ?

## 2022-02-22 NOTE — Discharge Summary (Signed)
Physician Discharge Summary  [name]:[id number] DOB: October 08, 1965 DOA: 02/19/2022  PCP: Nicholes Rough, PA-C  Admit date: 02/19/2022  Discharge date: 02/22/2022  Admitted From:Home  Disposition:  Home  Recommendations for Outpatient Follow-up:  Follow up with PCP in 1-2 weeks Follow-up arranged by urology on 11/2 for stone extraction Continue cefdinir as prescribed for 5 more day course of treatment as recommended by ID with stop date 10/27 Iron supplementation and Colace prescribed due to noted iron deficiency anemia Continue other home medications as prior  Home Health: None  Equipment/Devices: None  Discharge Condition:Stable  CODE STATUS: Full  Diet recommendation: Heart Healthy/carb modified  Brief/Interim Summary: Molly Berry is a 56 year old female who is s/p bilateral ureteral stent placement on 02/07/2022 from nephrolithiasis with hydronephrosis and urosepsis bacteremia. Medical history also significant for DM2, HTN, HLD, COPD, CKD, fibromyalgia, hypothyroidism, chronic pain syndrome, opioid dependence, history of gastric bypass 2016, and uterine cancer 2013 s/p hysterectomy. Patient presented to [facility] ED on 10/5 with complaint of fever, chills, nausea, vomiting, dysuria, and right flank pain.   Patient was admitted for recurrent, complicated UTI in the setting of recent nephrolithiasis with stent placement and E. coli bacteremia.  She developed recurrent dysuria and fever as well as abdominal pain despite taking her cefdinir at home and was admitted and started on IV Azactam and then transition to Rocephin per ID recommendations.  She has done well throughout the course of her stay and was seen by urology with no need for acute intervention at this time.  She will be transition to oral cefdinir for 5 more days per ID recommendations and follow-up with urology 11/2 for stone extraction.  Urine cultures demonstrated no growth.  Discharge Diagnoses:  Principal  Problem:   UTI (urinary tract infection) Active Problems:   Ureteral calculus, left   Controlled type 2 diabetes mellitus without complication, without long-term current use of insulin (HCC)   Essential hypertension, benign   Fibromyalgia syndrome   HLD (hyperlipidemia)   Obesity, Class III, BMI 40-49.9 (morbid obesity) ([place])  Principal discharge diagnosis: Recurrent, complicated UTI in the setting of nephrolithiasis and previous stent placement with prior E. coli bacteremia.  Discharge Instructions  Discharge Instructions     Diet - low sodium heart healthy   Complete by: As directed    Increase activity slowly   Complete by: As directed       Allergies as of 02/22/2022       Reactions   Bee Venom Shortness Of Breath, Swelling, Rash   Cephalexin Anaphylaxis   Cephalexin Anaphylaxis, Itching, Swelling   Ciprofloxacin Hives, Rash, Swelling   hives hives hives   Penicillins Hives, Rash, Itching, Swelling   Has patient had a PCN reaction causing immediate rash, facial/tongue/throat swelling, SOB or lightheadedness with hypotension: Unknown Has patient had a PCN reaction causing severe rash involving mucus membranes or skin necrosis: Unknown Has patient had a PCN reaction that required hospitalization: Unknown Has patient had a PCN reaction occurring within the last 10 years: No If all of the above answers are "NO", then may proceed with Cephalosporin use. Has patient had a PCN reaction causing immediate rash, facial/tongue/throat swelling, SOB or lightheadedness with hypotension: Unknown Has patient had a PCN reaction causing severe rash involving mucus membranes or skin necrosis: Unknown Has patient had a PCN reaction that required hospitalization: Unknown Has patient had a PCN reaction occurring within the last 10 years: No If all of the above answers are "NO", then may proceed  with Cephalosporin use.   Vancomycin Anaphylaxis, Hives, Itching   Has taken for bronchitis under  medical supervision on site with benadryl Other reaction(s): Other (See Comments) Red man syndrome, gave benadryl to help, per RN.  Slow down infusion with future dose. Has taken for bronchitis under medical supervision on site with benadryl   Carbamazepine    Loss of balance, trembling, inability to think, resulting in ER visit shortly after start of course  Other reaction(s): Other (See Comments) Speech, mind slows down, cant walk Loss of balance, trembling, inability to think, resulting in ER visit shortly after start of course   Cefotaxime Rash   Other reaction(s): Other (See Comments)   Sulfacetamide Sodium Rash   Adhesive [tape] Rash   Clindamycin Rash   Latex Rash   Sulfa Antibiotics Rash        Medication List     TAKE these medications    albuterol 108 (90 Base) MCG/ACT inhaler Commonly known as: VENTOLIN HFA Inhale 2 puffs into the lungs every 6 (six) hours as needed for shortness of breath or wheezing.   ALPRAZolam 1 MG tablet Commonly known as: XANAX Take 1 mg by mouth 3 (three) times daily as needed for anxiety or sleep.   buPROPion 150 MG 12 hr tablet Commonly known as: WELLBUTRIN SR Take 150 mg by mouth 2 (two) times daily.   cefdinir 300 MG capsule Commonly known as: OMNICEF Take 1 capsule (300 mg total) by mouth 2 (two) times daily for 5 days. Start taking on: February 23, 2022 What changed: when to take this   cyanocobalamin 1000 MCG tablet Take 1 tablet (1,000 mcg total) by mouth daily.   dicyclomine 20 MG tablet Commonly known as: BENTYL Take 20 mg by mouth 3 (three) times daily as needed for spasms.   diphenhydrAMINE 25 mg capsule Commonly known as: BENADRYL Take 25 mg by mouth at bedtime.   docusate sodium 100 MG capsule Commonly known as: COLACE Take 1 capsule (100 mg total) by mouth daily.   esomeprazole 40 MG capsule Commonly known as: NEXIUM Take 40 mg by mouth 2 (two) times daily before a meal.   famotidine 40 MG tablet Commonly  known as: PEPCID Take 40 mg by mouth at bedtime.   ferrous sulfate 325 (65 FE) MG tablet Take 1 tablet (325 mg total) by mouth daily with breakfast.   furosemide 20 MG tablet Commonly known as: LASIX Take 20 mg by mouth daily.   gabapentin 600 MG tablet Commonly known as: NEURONTIN Take 600 mg by mouth See admin instructions. Take 1200 mg by mouth in the morning, and 1200 mg at night   lamoTRIgine 200 MG tablet Commonly known as: LAMICTAL Take 200 mg by mouth 2 (two) times daily.   magic mouthwash (lidocaine, diphenhydrAMINE, alum & mag hydroxide) suspension Swish and spit 5 mLs 3 (three) times daily as needed for mouth pain.   methocarbamol 750 MG tablet Commonly known as: ROBAXIN Take 750 mg by mouth 3 (three) times daily as needed for muscle spasms.   metoprolol succinate 50 MG 24 hr tablet Commonly known as: TOPROL-XL Take 50 mg by mouth daily.   ondansetron 8 MG tablet Commonly known as: ZOFRAN Take 8 mg by mouth every 8 (eight) hours as needed for nausea or vomiting.   oxyCODONE-acetaminophen 10-325 MG tablet Commonly known as: PERCOCET Take 1 tablet by mouth every 4 (four) hours as needed for pain.   phenazopyridine 200 MG tablet Commonly known as: PYRIDIUM Take 1  tablet (200 mg total) by mouth 3 (three) times daily as needed for pain.   saccharomyces boulardii 250 MG capsule Commonly known as: FLORASTOR Take 1 capsule (250 mg total) by mouth 2 (two) times daily for 14 days.   simvastatin 20 MG tablet Commonly known as: ZOCOR Take 20 mg by mouth daily.   sucralfate 1 g tablet Commonly known as: CARAFATE Take 1 g by mouth 4 (four) times daily.   SUMAtriptan 100 MG tablet Commonly known as: IMITREX Take 100 mg by mouth every 2 (two) hours as needed for migraine.   tiZANidine 4 MG tablet Commonly known as: ZANAFLEX Take 4 mg by mouth at bedtime.   Vitamin D (Ergocalciferol) 1.25 MG (50000 UNIT) Caps capsule Commonly known as: DRISDOL Take 50,000  Units by mouth once a week.   vortioxetine HBr 20 MG Tabs tablet Commonly known as: TRINTELLIX Take 20 mg by mouth daily.        Follow-up Information     Nicholes Rough, PA-C. Schedule an appointment as soon as possible for a visit in 1 week(s).   Specialty: Physician Assistant Contact information: [place] [address] [phone]                Allergies  Allergen Reactions   Bee Venom Shortness Of Breath, Swelling and Rash   Cephalexin Anaphylaxis   Cephalexin Anaphylaxis, Itching and Swelling   Ciprofloxacin Hives, Rash and Swelling    hives hives  hives   Penicillins Hives, Rash, Itching and Swelling    Has patient had a PCN reaction causing immediate rash, facial/tongue/throat swelling, SOB or lightheadedness with hypotension: Unknown Has patient had a PCN reaction causing severe rash involving mucus membranes or skin necrosis: Unknown Has patient had a PCN reaction that required hospitalization: Unknown Has patient had a PCN reaction occurring within the last 10 years: No If all of the above answers are "NO", then may proceed with Cephalosporin use. Has patient had a PCN reaction causing immediate rash, facial/tongue/throat swelling, SOB or lightheadedness with hypotension: Unknown Has patient had a PCN reaction causing severe rash involving mucus membranes or skin necrosis: Unknown Has patient had a PCN reaction that required hospitalization: Unknown Has patient had a PCN reaction occurring within the last 10 years: No If all of the above answers are "NO", then may proceed with Cephalosporin use.   Vancomycin Anaphylaxis, Hives and Itching    Has taken for bronchitis under medical supervision on site with benadryl Other reaction(s): Other (See Comments) Red man syndrome, gave benadryl to help, per RN.  Slow down infusion with future dose.  Has taken for bronchitis under medical supervision on site with benadryl   Carbamazepine     Loss of  balance, trembling, inability to think, resulting in ER visit shortly after start of course  Other reaction(s): Other (See Comments) Speech, mind slows down, cant walk  Loss of balance, trembling, inability to think, resulting in ER visit shortly after start of course   Cefotaxime Rash    Other reaction(s): Other (See Comments)   Sulfacetamide Sodium Rash   Adhesive [Tape] Rash   Clindamycin Rash   Latex Rash   Sulfa Antibiotics Rash    Consultations: Urology ID   Procedures/Studies: DG Chest Port 1 View  Result Date: 02/19/2022 CLINICAL DATA:  Questionable sepsis. Recent urinary stent placement with dysuria. EXAM: PORTABLE CHEST 1 VIEW COMPARISON:  Radiographs 02/06/2022 and 07/17/2021.  CT 03/29/2009. FINDINGS: 0848 hours. Stable cardiomegaly and mediastinal contours. There  is a faint right suprahilar opacity which has mildly increased compared with the prior study. Atelectasis or scarring at both lung bases appears unchanged. No pneumothorax or significant pleural effusion. The bones appear stable. IMPRESSION: Mildly increased right suprahilar opacity which could reflect atelectasis or early infiltrate. Stable cardiomegaly and bibasilar atelectasis or scarring. Electronically Signed   By: Richardean Sale M.D.   On: 02/19/2022 09:03   DG C-Arm 1-60 Min-No Report  Result Date: 02/07/2022 Fluoroscopy was utilized by the requesting physician.  No radiographic interpretation.   NM Pulmonary Perfusion  Result Date: 02/07/2022 CLINICAL DATA:  Shortness of breath, positive D-dimer EXAM: NUCLEAR MEDICINE PERFUSION LUNG SCAN TECHNIQUE: Perfusion images were obtained in multiple projections after intravenous injection of radiopharmaceutical. Ventilation scans intentionally deferred if perfusion scan and chest x-ray adequate for interpretation during COVID 19 epidemic. RADIOPHARMACEUTICALS:  4.4 mCi Tc-28mMAA IV COMPARISON:  Chest radiograph done on 02/06/2022 FINDINGS: There are no  wedge-shaped perfusion defects. There is ill-defined area of decreased uptake in posterior left upper lung field. IMPRESSION: Low probability for pulmonary embolism. Electronically Signed   By: PElmer PickerM.D.   On: 02/07/2022 11:51   CT RENAL STONE STUDY  Result Date: 02/07/2022 CLINICAL DATA:  Right flank pain. EXAM: CT ABDOMEN AND PELVIS WITHOUT CONTRAST TECHNIQUE: Multidetector CT imaging of the abdomen and pelvis was performed following the standard protocol without IV contrast. RADIATION DOSE REDUCTION: This exam was performed according to the departmental dose-optimization program which includes automated exposure control, adjustment of the mA and/or kV according to patient size and/or use of iterative reconstruction technique. COMPARISON:  02/04/2022 FINDINGS: Lower chest: Bibasilar scarring and atelectasis. No pleural effusions. The heart is normal in size. No pericardial effusion. Hepatobiliary: No hepatic lesions or intrahepatic biliary dilatation. The gallbladder is surgically absent. No common bile duct dilatation. Pancreas: No mass, inflammation ductal dilatation. Spleen: Normal size.  No focal lesions. Adrenals/Urinary Tract: The adrenal glands are normal. New moderate right-sided hydronephrosis and hydroureter. There are 2 ureteral calculi. The more proximal calculus is at the L5 level and measures a maximum of or 6 mm. The more distal calculus is located at the mid sacral level and also measures 6 mm. Bilateral renal calculi. There is a 6 mm calculus in the left extra renal pelvis but no obstructing left ureteral calculi. No bladder calculi. No worrisome renal or bladder lesions are identified without contrast. 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 The duodenum, small bowel and colon unremarkable. Vascular/Lymphatic: Stable scattered atherosclerotic calcifications involving the aorta and iliac arteries but no aneurysm. No mesenteric  or retroperitoneal lymphadenopathy. A retroaortic left renal vein is noted incidentally. Reproductive: Surgically absent. Other: Small amount of perinephric fluid on the right side also tracking down along the right ureter and into the pelvis. Musculoskeletal: No significant bony findings. IMPRESSION: 1. Two new 6 mm right ureteral calculi causing moderate right-sided hydronephrosis and hydroureter. 2. Bilateral renal calculi. 3. 6 mm calculus in the left extra renal pelvis but no obstructing left ureteral calculi. 4. 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 5. Status post cholecystectomy. No biliary dilatation. 6. Aortic atherosclerosis. Aortic Atherosclerosis (ICD10-I70.0). Electronically Signed   By: PMarijo SanesM.D.   On: 02/07/2022 10:13   DG Chest 2 View  Result Date: 02/06/2022 CLINICAL DATA:  Short of breath for just over 1 week. Diagnosed with kidney stones and put on antibiotics this week. EXAM: CHEST - 2 VIEW COMPARISON:  07/17/2021.  CT, 03/29/2009. FINDINGS:  Mild enlargement of cardiac silhouette. No mediastinal or hilar masses. No evidence of adenopathy Clear lungs.  No pleural effusion or pneumothorax. Skeletal structures are intact. IMPRESSION: No active cardiopulmonary disease. Electronically Signed   By: Lajean Manes M.D.   On: 02/06/2022 12:40     Discharge Exam: Vitals:   02/21/22 2006 02/22/22 0456  BP: 131/67 (!) 119/58  Pulse: 84 68  Resp: 16 13  Temp: 98.2 F (36.8 C) 98.3 F (36.8 C)  SpO2: 95% 91%   Vitals:   02/21/22 0521 02/21/22 1321 02/21/22 2006 02/22/22 0456  BP: 102/62 (!) 97/57 131/67 (!) 119/58  Pulse: (!) 51 (!) 56 84 68  Resp: '18 18 16 13  '$ Temp: (!) 96.8 F (36 C) 98.7 F (37.1 C) 98.2 F (36.8 C) 98.3 F (36.8 C)  TempSrc:  Oral Oral   SpO2: 96% 93% 95% 91%  Weight:      Height:        General: Pt is alert, awake, not in acute distress Cardiovascular: RRR, S1/S2 +, no rubs, no gallops Respiratory: CTA  bilaterally, no wheezing, no rhonchi Abdominal: Soft, NT, ND, bowel sounds + Extremities: no edema, no cyanosis    The results of significant diagnostics from this hospitalization (including imaging, microbiology, ancillary and laboratory) are listed below for reference.     Microbiology: Recent Results (from the past 240 hour(s))  Urine Culture     Status: None   Collection Time: 02/19/22  8:39 AM   Specimen: Urine, Catheterized  Result Value Ref Range Status   Specimen Description   Final    URINE, CATHETERIZED Performed at [facility], [address] [place], [place] [address]    Special Requests   Final    NONE Performed at [facility], [address], [place], [place] [address]    Culture   Final    NO GROWTH Performed at [facility], [place] [address] [place], [place] [address]    Report Status 02/20/2022 FINAL  Final  Blood Culture (routine x 2)     Status: None (Preliminary result)   Collection Time: 02/19/22  9:01 AM   Specimen: Right Antecubital; Blood  Result Value Ref Range Status   Specimen Description RIGHT ANTECUBITAL  Final   Special Requests   Final    BOTTLES DRAWN AEROBIC AND ANAEROBIC Blood Culture adequate volume   Culture   Final    NO GROWTH 3 DAYS Performed at [facility], [address] [place], [place] [address]    Report Status PENDING  Incomplete  Blood Culture (routine x 2)     Status: None (Preliminary result)   Collection Time: 02/19/22  9:05 AM   Specimen: Left Antecubital; Blood  Result Value Ref Range Status   Specimen Description LEFT ANTECUBITAL  Final   Special Requests   Final    BOTTLES DRAWN AEROBIC AND ANAEROBIC Blood Culture adequate volume   Culture   Final    NO GROWTH 3 DAYS Performed at [facility], [address], [place],  [address]    Report Status PENDING  Incomplete  Resp Panel by RT-PCR (Flu A&B, Covid) Anterior Nasal Swab     Status: None   Collection Time: 02/19/22  9:20 AM   Specimen: Anterior  Nasal Swab  Result Value Ref Range Status   SARS Coronavirus 2 by RT PCR NEGATIVE NEGATIVE Final    Comment: (NOTE) SARS-CoV-2 target nucleic acids are NOT DETECTED.  The SARS-CoV-2 RNA is generally detectable in  upper respiratory specimens during the acute phase of infection. The lowest concentration of SARS-CoV-2 viral copies this assay can detect is 138 copies/mL. A negative result does not preclude SARS-Cov-2 infection and should not be used as the sole basis for treatment or other patient management decisions. A negative result may occur with  improper specimen collection/handling, submission of specimen other than nasopharyngeal swab, presence of viral mutation(s) within the areas targeted by this assay, and inadequate number of viral copies(<138 copies/mL). A negative result must be combined with clinical observations, patient history, and epidemiological information. The expected result is Negative.  Fact Sheet for Patients:  EntrepreneurPulse.com.au  Fact Sheet for Healthcare Providers:  IncredibleEmployment.be  This test is no t yet approved or cleared by the [place] FDA and  has been authorized for detection and/or diagnosis of SARS-CoV-2 by FDA under an Emergency Use Authorization (EUA). This EUA will remain  in effect (meaning this test can be used) for the duration of the COVID-19 declaration under Section 564(b)(1) of the Act, 21 U.S.C.section 360bbb-3(b)(1), unless the authorization is terminated  or revoked sooner.       Influenza A by PCR NEGATIVE NEGATIVE Final   Influenza B by PCR NEGATIVE NEGATIVE Final    Comment: (NOTE) The Xpert Xpress SARS-CoV-2/FLU/RSV plus assay is intended as an aid in the diagnosis of influenza from Nasopharyngeal swab specimens and should not be used as a sole basis for treatment. Nasal washings and aspirates are unacceptable for Xpert Xpress SARS-CoV-2/FLU/RSV testing.  Fact Sheet for  Patients: EntrepreneurPulse.com.au  Fact Sheet for Healthcare Providers: IncredibleEmployment.be  This test is not yet approved or cleared by the [place] FDA and has been authorized for detection and/or diagnosis of SARS-CoV-2 by FDA under an Emergency Use Authorization (EUA). This EUA will remain in effect (meaning this test can be used) for the duration of the COVID-19 declaration under Section 564(b)(1) of the Act, 21 U.S.C. section 360bbb-3(b)(1), unless the authorization is terminated or revoked.  Performed at [facility], [address], [place], [place] [address]      Labs: BNP (last 3 results) No results for input(s): "BNP" in the last 8760 hours. Basic Metabolic Panel: Recent Labs  Lab 02/19/22 0905 02/20/22 0430 02/21/22 0458  NA 138 139 140  K 4.4 4.6 4.8  CL 103 108 107  CO2 '29 24 28  '$ GLUCOSE 119* 106* 93  BUN '13 15 13  '$ CREATININE 1.09* 1.17* 0.87  CALCIUM 8.9 8.3* 8.5*  MG  --  2.3 2.2   Liver Function Tests: Recent Labs  Lab 02/19/22 0905 02/20/22 0430  AST 34 21  ALT 33 23  ALKPHOS 154* 117  BILITOT 0.7 0.6  PROT 7.4 6.3*  ALBUMIN 3.7 3.2*   No results for input(s): "LIPASE", "AMYLASE" in the last 168 hours. No results for input(s): "AMMONIA" in the last 168 hours. CBC: Recent Labs  Lab 02/19/22 0917 02/20/22 0430 02/21/22 0458  WBC 12.7* 5.8 5.8  NEUTROABS 10.9*  --   --   HGB 11.5* 10.0* 10.1*  HCT 38.7 36.0 35.3*  MCV 88.6 93.3 90.5  PLT 417* 331 364   Cardiac Enzymes: No results for input(s): "CKTOTAL", "CKMB", "CKMBINDEX", "TROPONINI" in the last 168 hours. BNP: Invalid input(s): "POCBNP" CBG: Recent Labs  Lab 02/20/22 2200 02/21/22 0745 02/21/22 1111 02/21/22 1655 02/21/22 2008  GLUCAP 149* 140* 127* 112* 261*   D-Dimer No results for input(s): "DDIMER" in the last 72 hours. Hgb A1c No results for input(s): "HGBA1C" in  the last 72 hours. Lipid Profile No results for  input(s): "CHOL", "HDL", "LDLCALC", "TRIG", "CHOLHDL", "LDLDIRECT" in the last 72 hours. Thyroid function studies No results for input(s): "TSH", "T4TOTAL", "T3FREE", "THYROIDAB" in the last 72 hours.  Invalid input(s): "FREET3" Anemia work up No results for input(s): "VITAMINB12", "FOLATE", "FERRITIN", "TIBC", "IRON", "RETICCTPCT" in the last 72 hours. Urinalysis    Component Value Date/Time   COLORURINE AMBER (A) 02/19/2022 0839   APPEARANCEUR HAZY (A) 02/19/2022 0839   LABSPEC 1.020 02/19/2022 0839   PHURINE 5.0 02/19/2022 0839   GLUCOSEU NEGATIVE 02/19/2022 0839   HGBUR MODERATE (A) 02/19/2022 0839   BILIRUBINUR NEGATIVE 02/19/2022 0839   BILIRUBINUR negative 08/06/2021 0849   KETONESUR NEGATIVE 02/19/2022 0839   PROTEINUR 100 (A) 02/19/2022 0839   UROBILINOGEN 1.0 08/06/2021 0849   UROBILINOGEN 1.0 01/16/2015 0149   NITRITE POSITIVE (A) 02/19/2022 0839   LEUKOCYTESUR NEGATIVE 02/19/2022 0839   Sepsis Labs Recent Labs  Lab 02/19/22 0917 02/20/22 0430 02/21/22 0458  WBC 12.7* 5.8 5.8   Microbiology Recent Results (from the past 240 hour(s))  Urine Culture     Status: None   Collection Time: 02/19/22  8:39 AM   Specimen: Urine, Catheterized  Result Value Ref Range Status   Specimen Description   Final    URINE, CATHETERIZED Performed at [facility], [address], [place], [place] [address]    Special Requests   Final    NONE Performed at [facility], [address], [place], [place] [address]    Culture   Final    NO GROWTH Performed at [facility], [place] [address], [place], [place] [address]    Report Status 02/20/2022 FINAL  Final  Blood Culture (routine x 2)     Status: None (Preliminary result)   Collection Time: 02/19/22  9:01 AM   Specimen: Right Antecubital; Blood  Result Value Ref Range Status   Specimen Description RIGHT ANTECUBITAL  Final   Special Requests   Final    BOTTLES DRAWN AEROBIC AND ANAEROBIC Blood Culture adequate volume    Culture   Final    NO GROWTH 3 DAYS Performed at [facility], [address], [place], [place] [address]    Report Status PENDING  Incomplete  Blood Culture (routine x 2)     Status: None (Preliminary result)   Collection Time: 02/19/22  9:05 AM   Specimen: Left Antecubital; Blood  Result Value Ref Range Status   Specimen Description LEFT ANTECUBITAL  Final   Special Requests   Final    BOTTLES DRAWN AEROBIC AND ANAEROBIC Blood Culture adequate volume   Culture   Final    NO GROWTH 3 DAYS Performed at [facility], [address] [place], [place] [address]    Report Status PENDING  Incomplete  Resp Panel by RT-PCR (Flu A&B, Covid) Anterior Nasal Swab     Status: None   Collection Time: 02/19/22  9:20 AM   Specimen: Anterior Nasal Swab  Result Value Ref Range Status   SARS Coronavirus 2 by RT PCR NEGATIVE NEGATIVE Final    Comment: (NOTE) SARS-CoV-2 target nucleic acids are NOT DETECTED.  The SARS-CoV-2 RNA is generally detectable in upper respiratory specimens during the acute phase of infection. The lowest concentration of SARS-CoV-2 viral copies this assay can detect is 138 copies/mL. A negative result does not preclude SARS-Cov-2 infection and should not be used as the sole basis for treatment or other patient management decisions. A negative result may occur  with  improper specimen collection/handling, submission of specimen other than nasopharyngeal swab, presence of viral mutation(s) within the areas targeted by this assay, and inadequate number of viral copies(<138 copies/mL). A negative result must be combined with clinical observations, patient history, and epidemiological information. The expected result is Negative.  Fact Sheet for Patients:  EntrepreneurPulse.com.au  Fact Sheet for Healthcare Providers:  IncredibleEmployment.be  This test is no t yet approved or cleared by the [place] FDA and  has been authorized for  detection and/or diagnosis of SARS-CoV-2 by FDA under an Emergency Use Authorization (EUA). This EUA will remain  in effect (meaning this test can be used) for the duration of the COVID-19 declaration under Section 564(b)(1) of the Act, 21 U.S.C.section 360bbb-3(b)(1), unless the authorization is terminated  or revoked sooner.       Influenza A by PCR NEGATIVE NEGATIVE Final   Influenza B by PCR NEGATIVE NEGATIVE Final    Comment: (NOTE) The Xpert Xpress SARS-CoV-2/FLU/RSV plus assay is intended as an aid in the diagnosis of influenza from Nasopharyngeal swab specimens and should not be used as a sole basis for treatment. Nasal washings and aspirates are unacceptable for Xpert Xpress SARS-CoV-2/FLU/RSV testing.  Fact Sheet for Patients: EntrepreneurPulse.com.au  Fact Sheet for Healthcare Providers: IncredibleEmployment.be  This test is not yet approved or cleared by the [place] FDA and has been authorized for detection and/or diagnosis of SARS-CoV-2 by FDA under an Emergency Use Authorization (EUA). This EUA will remain in effect (meaning this test can be used) for the duration of the COVID-19 declaration under Section 564(b)(1) of the Act, 21 U.S.C. section 360bbb-3(b)(1), unless the authorization is terminated or revoked.  Performed at [facility], [address], [place], [place] [address]      Time coordinating discharge: 35 minutes  SIGNED:   Rodena Goldmann, DO [facility] 02/22/2022, 8:24 AM  If 7PM-7AM, please contact night-coverage [email or web address]

## 2022-02-24 LAB — CULTURE, BLOOD (ROUTINE X 2)
Culture: NO GROWTH
Culture: NO GROWTH
Special Requests: ADEQUATE
Special Requests: ADEQUATE

## 2022-02-25 ENCOUNTER — Telehealth: Payer: Self-pay

## 2022-02-25 ENCOUNTER — Ambulatory Visit (INDEPENDENT_AMBULATORY_CARE_PROVIDER_SITE_OTHER): Payer: 59 | Admitting: Urology

## 2022-02-25 ENCOUNTER — Encounter: Payer: Self-pay | Admitting: Urology

## 2022-02-25 VITALS — BP 111/75 | HR 87

## 2022-02-25 DIAGNOSIS — N200 Calculus of kidney: Secondary | ICD-10-CM | POA: Diagnosis not present

## 2022-02-25 LAB — URINALYSIS, ROUTINE W REFLEX MICROSCOPIC
Specific Gravity, UA: 1.025 (ref 1.005–1.030)
pH, UA: 5 (ref 5.0–7.5)

## 2022-02-25 LAB — MICROSCOPIC EXAMINATION: RBC, Urine: >30 /hpf — ABNORMAL HIGH (ref 0–2)

## 2022-02-25 MED ORDER — MIRABEGRON ER 25 MG PO TB24: 25.0000 mg | ORAL_TABLET | Freq: Every day | ORAL | 0 refills | Status: DC

## 2022-02-25 MED ORDER — OXYCODONE-ACETAMINOPHEN 10-325 MG PO TABS: 1.0000 | ORAL_TABLET | ORAL | 0 refills | Status: DC | PRN

## 2022-02-25 MED ORDER — ONDANSETRON HCL 8 MG PO TABS: 8.0000 mg | ORAL_TABLET | Freq: Three times a day (TID) | ORAL | 1 refills | Status: DC | PRN

## 2022-02-25 MED ORDER — FLUCONAZOLE 150 MG PO TABS: 150.0000 mg | ORAL_TABLET | Freq: Every day | ORAL | 0 refills | Status: DC

## 2022-02-25 MED ORDER — PHENAZOPYRIDINE HCL 100 MG PO TABS: 100.0000 mg | ORAL_TABLET | Freq: Three times a day (TID) | ORAL | 0 refills | Status: DC | PRN

## 2022-02-25 NOTE — Telephone Encounter (Signed)
Patient advised she forgot to tell Dr. Alyson Ingles she had a yeast infection and wanted to know if medication could be called into pharmacy.    Pharmacy: [facility]

## 2022-02-25 NOTE — Telephone Encounter (Signed)
Verbal from Dr. Alyson Ingles to send in Diflucan '150mg'$  daily for 2 days.  Patient also left voicemail asking what time her surgery is.  I returned call and informed her of her rx as well as informing her that someone from pre-op will be calling her to give her that information.  Contradiction between simvastatin and Diflucan, consulted Julienne Summerlin, PA-C in office and she gave verbal to inform patient due to the interaction to skip the simvistatin (Zocor) for that day.  Patient made aware and voiced understanding.         Patient advised she forgot to tell Dr. Alyson Ingles she had a yeast infection and wanted to know if medication could be called into pharmacy.      Pharmacy: [facility]

## 2022-02-25 NOTE — Patient Instructions (Signed)

## 2022-02-25 NOTE — H&P (View-Only) (Signed)
02/25/2022 9:26 AM   Molly Berry 01-17-1966 [id number]  Referring provider: Nicholes Rough, PA-C [place],  [place] [address]  nephrolithiasis   HPI:  Ms Henneke is a 36OQ here for followup for nephrolithiasis.  She has bilateral ureteral calculi and underwent stent placement 10/6. She has urinary frequency, urgency and dysuria with the stents in place. She is currently on cefdinir until 10/27. She denies hematuria. She is taking pyridium prn for the dysuria. 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 This could reflect an inflammatory process,  although follow-up is necessary to exclude atypical neoplasm. Full  chest CT should be considered to evaluate for other pulmonary  findings.      Multiple thyroid nodules    Nephropathy    Pneumonia    PONV (postoperative nausea and vomiting)    Pulmonary nodules    Thyroid disease    Ulcer    Uterine cancer ([place]) 12/26/2011   Stage 1, grade 1, S/P hysterectomy initially by robotic technique and then salpingo-oophorectomy at [facility] receiving no postoperative treatment.    Surgical History: Past Surgical History:   Procedure Laterality Date   ABDOMINAL HYSTERECTOMY     endometrial cancer   CHOLECYSTECTOMY     COLONOSCOPY WITH PROPOFOL N/A 01/19/2014   HUT:[id number]   CYSTOSCOPY W/ URETERAL STENT PLACEMENT Bilateral 02/07/2022   Procedure: CYSTOSCOPY WITH RETROGRADE PYELOGRAM/RIGHT URETERAL STENT PLACEMENT WITH POSSIBLE LEFT;  Surgeon: Janith Lima, MD;  Location: [facility];  Service: Urology;  Laterality: Bilateral;   DILATION AND CURETTAGE OF UTERUS  12 yrs ago   DILATION AND CURETTAGE OF UTERUS  06/18/2011   Procedure: DILATATION AND CURETTAGE;  Surgeon: Florian Buff, MD;  Location: [facility];  Service: Gynecology;  Laterality: N/A;  Suction Dilation and Curettage   ESOPHAGOGASTRODUODENOSCOPY N/A 10/12/2013   Dr. Rourk:anastomotic ulcer likely cause of bleeding. likely ischemic    ESOPHAGOGASTRODUODENOSCOPY (EGD) WITH PROPOFOL N/A 01/19/2014   KCL:EXNTZG   EXTRACORPOREAL SHOCK WAVE LITHOTRIPSY Left 01/11/2018   Procedure: LEFT EXTRACORPOREAL SHOCK WAVE LITHOTRIPSY (ESWL);  Surgeon: Lucas Mallow, MD;  Location: [facility];  Service: Urology;  Laterality: Left;   EXTRACORPOREAL SHOCK WAVE LITHOTRIPSY Left 01/14/2018   Procedure: LEFT EXTRACORPOREAL SHOCK WAVE LITHOTRIPSY (ESWL);  Surgeon: Cleon Gustin, MD;  Location: [facility];  Service: Urology;  Laterality: Left;  42 MINS  W/ MAC   GASTRIC BYPASS  2013   [facility]   HYSTEROSCOPY WITH D & C  06/18/2011   Procedure: DILATATION AND CURETTAGE /HYSTEROSCOPY;  Surgeon: Florian Buff, MD;  Location: [facility];  Service: Gynecology;  Laterality: N/A;   LITHOTRIPSY     paniculectomy     SHOULDER ARTHROSCOPY WITH SUBACROMIAL DECOMPRESSION AND OPEN ROTATOR C Left 07/16/2021   Procedure: LEFT ARTHROSCOPIC SUBACROMIAL DECOMPRESSION, MINI OPEN ROTATOR CUFF TEAR REPAIR, BICEPS TENOTOMY;  Surgeon: [name], MD;  Location: [facility];  Service: Orthopedics;  Laterality: Left;   TRIGGER FINGER RELEASE     WISDOM TOOTH EXTRACTION      Home Medications:   Allergies as of 02/25/2022       Reactions   Bee Venom Shortness Of Breath, Swelling, Rash   Cephalexin Anaphylaxis   Cephalexin Anaphylaxis, Itching, Swelling   Ciprofloxacin Hives, Rash, Swelling   hives hives hives   Penicillins Hives, Rash, Itching, Swelling   Has patient had a PCN reaction causing immediate rash, facial/tongue/throat swelling, SOB or lightheadedness with hypotension: Unknown Has patient had a PCN reaction causing severe rash involving mucus membranes or skin necrosis: Unknown Has patient had a PCN reaction that required hospitalization: Unknown Has patient had a PCN reaction occurring within the last 10 years: No If all of the above answers are "NO", then may proceed with Cephalosporin use. Has patient had a PCN reaction causing immediate rash, facial/tongue/throat swelling, SOB or lightheadedness with hypotension: Unknown Has patient had a PCN reaction causing severe rash involving mucus membranes or skin necrosis: Unknown Has patient had a PCN reaction that required hospitalization: Unknown Has patient had a PCN reaction occurring within the last 10 years: No If all of the above answers are "NO", then may proceed with Cephalosporin use.   Vancomycin Anaphylaxis, Hives, Itching   Has taken for bronchitis under medical supervision on site with benadryl Other reaction(s): Other (See Comments) Red man syndrome, gave benadryl to help, per RN.  Slow down infusion with future dose. Has taken for bronchitis under medical supervision on site with benadryl   Carbamazepine    Loss of balance, trembling, inability to think, resulting in ER visit shortly after start of course  Other reaction(s): Other (See Comments) Speech, mind slows down, cant walk Loss of balance, trembling, inability to think, resulting in ER visit shortly after start of course   Cefotaxime Rash   Other reaction(s): Other (See Comments)   Sulfacetamide Sodium Rash   Adhesive [tape] Rash   Clindamycin  Rash   Latex Rash   Sulfa Antibiotics Rash        Medication List        Accurate as of February 25, 2022  9:26 AM. If you have any questions, ask your nurse or doctor.          albuterol 108 (90 Base) MCG/ACT inhaler Commonly known as: VENTOLIN HFA Inhale 2 puffs into the lungs every 6 (six) hours as needed for shortness of breath or wheezing.   ALPRAZolam 1 MG tablet Commonly known as: XANAX Take 1 mg by mouth 3 (three) times daily as needed for anxiety or sleep.   buPROPion 150 MG 12 hr tablet Commonly known as: WELLBUTRIN SR Take 150 mg by mouth 2 (two) times daily.   cefdinir 300 MG capsule Commonly known as: OMNICEF Take 1 capsule (300 mg total) by mouth 2 (two) times daily for 5 days.   cyanocobalamin 1000 MCG tablet Take 1 tablet (1,000 mcg total) by mouth daily.   dicyclomine 20 MG tablet Commonly known as: BENTYL Take 20 mg by mouth  3 (three) times daily as needed for spasms.   diphenhydrAMINE 25 mg capsule Commonly known as: BENADRYL Take 25 mg by mouth at bedtime.   docusate sodium 100 MG capsule Commonly known as: COLACE Take 1 capsule (100 mg total) by mouth daily.   esomeprazole 40 MG capsule Commonly known as: NEXIUM Take 40 mg by mouth 2 (two) times daily before a meal.   famotidine 40 MG tablet Commonly known as: PEPCID Take 40 mg by mouth at bedtime.   ferrous sulfate 325 (65 FE) MG tablet Take 1 tablet (325 mg total) by mouth daily with breakfast.   furosemide 20 MG tablet Commonly known as: LASIX Take 20 mg by mouth daily.   gabapentin 600 MG tablet Commonly known as: NEURONTIN Take 600 mg by mouth See admin instructions. Take 1200 mg by mouth in the morning, and 1200 mg at night   lamoTRIgine 200 MG tablet Commonly known as: LAMICTAL Take 200 mg by mouth 2 (two) times daily.   magic mouthwash (lidocaine, diphenhydrAMINE, alum & mag hydroxide) suspension Swish and spit 5 mLs 3 (three) times daily as needed for mouth pain.    methocarbamol 750 MG tablet Commonly known as: ROBAXIN Take 750 mg by mouth 3 (three) times daily as needed for muscle spasms.   metoprolol succinate 50 MG 24 hr tablet Commonly known as: TOPROL-XL Take 50 mg by mouth daily.   ondansetron 8 MG tablet Commonly known as: ZOFRAN Take 8 mg by mouth every 8 (eight) hours as needed for nausea or vomiting.   oxyCODONE-acetaminophen 10-325 MG tablet Commonly known as: PERCOCET Take 1 tablet by mouth every 4 (four) hours as needed for pain.   phenazopyridine 200 MG tablet Commonly known as: PYRIDIUM Take 1 tablet (200 mg total) by mouth 3 (three) times daily as needed for pain.   simvastatin 20 MG tablet Commonly known as: ZOCOR Take 20 mg by mouth daily.   sucralfate 1 g tablet Commonly known as: CARAFATE Take 1 g by mouth 4 (four) times daily.   SUMAtriptan 100 MG tablet Commonly known as: IMITREX Take 100 mg by mouth every 2 (two) hours as needed for migraine.   tiZANidine 4 MG tablet Commonly known as: ZANAFLEX Take 4 mg by mouth at bedtime.   Vitamin D (Ergocalciferol) 1.25 MG (50000 UNIT) Caps capsule Commonly known as: DRISDOL Take 50,000 Units by mouth once a week.   vortioxetine HBr 20 MG Tabs tablet Commonly known as: TRINTELLIX Take 20 mg by mouth daily.        Allergies:  Allergies  Allergen Reactions   Bee Venom Shortness Of Breath, Swelling and Rash   Cephalexin Anaphylaxis   Cephalexin Anaphylaxis, Itching and Swelling   Ciprofloxacin Hives, Rash and Swelling    hives hives  hives   Penicillins Hives, Rash, Itching and Swelling    Has patient had a PCN reaction causing immediate rash, facial/tongue/throat swelling, SOB or lightheadedness with hypotension: Unknown Has patient had a PCN reaction causing severe rash involving mucus membranes or skin necrosis: Unknown Has patient had a PCN reaction that required hospitalization: Unknown Has patient had a PCN reaction occurring within the last 10  years: No If all of the above answers are "NO", then may proceed with Cephalosporin use. Has patient had a PCN reaction causing immediate rash, facial/tongue/throat swelling, SOB or lightheadedness with hypotension: Unknown Has patient had a PCN reaction causing severe rash involving mucus membranes or skin necrosis: Unknown Has patient had a PCN reaction that required  hospitalization: Unknown Has patient had a PCN reaction occurring within the last 10 years: No If all of the above answers are "NO", then may proceed with Cephalosporin use.   Vancomycin Anaphylaxis, Hives and Itching    Has taken for bronchitis under medical supervision on site with benadryl Other reaction(s): Other (See Comments) Red man syndrome, gave benadryl to help, per RN.  Slow down infusion with future dose.  Has taken for bronchitis under medical supervision on site with benadryl   Carbamazepine     Loss of balance, trembling, inability to think, resulting in ER visit shortly after start of course  Other reaction(s): Other (See Comments) Speech, mind slows down, cant walk  Loss of balance, trembling, inability to think, resulting in ER visit shortly after start of course   Cefotaxime Rash    Other reaction(s): Other (See Comments)   Sulfacetamide Sodium Rash   Adhesive [Tape] Rash   Clindamycin Rash   Latex Rash   Sulfa Antibiotics Rash    Family History: Family History  Problem Relation Age of Onset   Pneumonia Mother        Deceased   Arthritis Mother    Asthma Mother    Cancer Mother        pancreatic   COPD Mother    Depression Mother    Diabetes Mother    Kidney disease Mother    Liver cancer Father        Living   Arthritis Father    Cancer Father        liver, prostate   COPD Father    Depression Father    Stroke Father    Heart disease Maternal Grandmother    Mental illness Maternal Grandmother    Breast cancer Maternal Grandmother    Alcohol abuse Paternal Grandfather    Breast cancer  Paternal Aunt    Anesthesia problems Neg Hx    Hypotension Neg Hx    Malignant hyperthermia Neg Hx    Pseudochol deficiency Neg Hx    Colon cancer Neg Hx     Social History:  reports that she has never smoked. She has never used smokeless tobacco. She reports that she does not drink alcohol and does not use drugs.  ROS: All other review of systems were reviewed and are negative except what is noted above in HPI  Physical Exam: BP 111/75   Pulse 87   LMP 05/06/2011   Constitutional:  Alert and oriented, No acute distress. HEENT: [place] AT, moist mucus membranes.  Trachea midline, no masses. Cardiovascular: No clubbing, cyanosis, or edema. Respiratory: Normal respiratory effort, no increased work of breathing. GI: Abdomen is soft, nontender, nondistended, no abdominal masses GU: No CVA tenderness.  Lymph: No cervical or inguinal lymphadenopathy. Skin: No rashes, bruises or suspicious lesions. Neurologic: Grossly intact, no focal deficits, moving all 4 extremities. Psychiatric: Normal mood and affect.  Laboratory Data: Lab Results  Component Value Date   WBC 5.8 02/21/2022   HGB 10.1 (L) 02/21/2022   HCT 35.3 (L) 02/21/2022   MCV 90.5 02/21/2022   PLT 364 02/21/2022    Lab Results  Component Value Date   CREATININE 0.87 02/21/2022    No results found for: "PSA"  No results found for: "TESTOSTERONE"  Lab Results  Component Value Date   HGBA1C 6.4 (H) 02/07/2022    Urinalysis    Component Value Date/Time   COLORURINE AMBER (A) 02/19/2022 0839   APPEARANCEUR HAZY (A) 02/19/2022 0839   LABSPEC 1.020  02/19/2022 0839   PHURINE 5.0 02/19/2022 0839   GLUCOSEU NEGATIVE 02/19/2022 0839   HGBUR MODERATE (A) 02/19/2022 0839   BILIRUBINUR NEGATIVE 02/19/2022 0839   BILIRUBINUR negative 08/06/2021 0849   KETONESUR NEGATIVE 02/19/2022 0839   PROTEINUR 100 (A) 02/19/2022 0839   UROBILINOGEN 1.0 08/06/2021 0849   UROBILINOGEN 1.0 01/16/2015 0149   NITRITE POSITIVE (A)  02/19/2022 0839   LEUKOCYTESUR NEGATIVE 02/19/2022 0839    Lab Results  Component Value Date   BACTERIA NONE SEEN 02/19/2022    Pertinent Imaging: Ct 02/07/2022: Images reviewed and discussed with the patient  Results for orders placed during the hospital encounter of 01/14/18  DG Abd 1 View  Narrative CLINICAL DATA:  Patient for lithotripsy today.  EXAM: ABDOMEN - 1 VIEW  COMPARISON:  CT abdomen and pelvis 12/30/2017.  KUB 01/11/2018.  FINDINGS: Although visualization is somewhat difficult due to gas and stool, stone within the left renal pelvis is unchanged. 2 small stones in the upper pole of the right kidney are also unchanged. No evidence of ureteral stone is identified. Bowel gas pattern is nonobstructive.  IMPRESSION: Stone in the left renal pelvis and 2 stones in the upper pole of the right kidney are unchanged. Visualization is limited due to extensive gas and stool.   Electronically Signed By: Inge Rise M.D. On: 01/14/2018 13:47  No results found for this or any previous visit.  No results found for this or any previous visit.  No results found for this or any previous visit.  Results for orders placed during the hospital encounter of 02/15/15  US Renal  Narrative CLINICAL DATA:  Episode of acute cystitis 1 month ago, history of kidney stones and lithotripsy for same.  EXAM: RENAL / URINARY TRACT ULTRASOUND COMPLETE  COMPARISON:  Abdominal and pelvic CT scan of November 18, 2013  FINDINGS: Right Kidney:  Length: 12.3 cm. The renal cortical echotexture is normal. There is no hydronephrosis. No masses are observed. No definite stones are appreciated  Left Kidney:  Length: 12.7 cm. The renal cortical echotexture is normal. There is no cystic or solid mass. There is no hydronephrosis. No definite stones are appreciated.  Bladder:  The bladder was difficult to visualize due to the patient's body habitus. Ureteral jets could not be  confirmed.  IMPRESSION: 1. No acute or significant chronic abnormality of the kidneys is observed. Stones were demonstrated bilaterally on the July 2015 CT scan but are not definitely appreciated today. 2. Evaluation of the bladder was limited.   Electronically Signed By: David  Martinique M.D. On: 02/15/2015 15:57  No valid procedures specified. No results found for this or any previous visit.  Results for orders placed during the hospital encounter of 02/06/22  CT RENAL STONE STUDY  Narrative CLINICAL DATA:  Right flank pain.  EXAM: CT ABDOMEN AND PELVIS WITHOUT CONTRAST  TECHNIQUE: Multidetector CT imaging of the abdomen and pelvis was performed following the standard protocol without IV contrast.  RADIATION DOSE REDUCTION: This exam was performed according to the departmental dose-optimization program which includes automated exposure control, adjustment of the mA and/or kV according to patient size and/or use of iterative reconstruction technique.  COMPARISON:  02/04/2022  FINDINGS: Lower chest: Bibasilar scarring and atelectasis. No pleural effusions. The heart is normal in size. No pericardial effusion.  Hepatobiliary: No hepatic lesions or intrahepatic biliary dilatation. The gallbladder is surgically absent. No common bile duct dilatation.  Pancreas: No mass, inflammation ductal dilatation.  Spleen: Normal size.  No focal lesions.  Adrenals/Urinary Tract: The adrenal glands are normal.  New moderate right-sided hydronephrosis and hydroureter. There are 2 ureteral calculi. The more proximal calculus is at the L5 level and measures a maximum of or 6 mm. The more distal calculus is located at the mid sacral level and also measures 6 mm.  Bilateral renal calculi. There is a 6 mm calculus in the left extra renal pelvis but no obstructing left ureteral calculi. No bladder calculi. No worrisome renal or bladder lesions are identified without  contrast.  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 The duodenum, small bowel and colon unremarkable.  Vascular/Lymphatic: Stable scattered atherosclerotic calcifications involving the aorta and iliac arteries but no aneurysm. No mesenteric or retroperitoneal lymphadenopathy. A retroaortic left renal vein is noted incidentally.  Reproductive: Surgically absent.  Other: Small amount of perinephric fluid on the right side also tracking down along the right ureter and into the pelvis.  Musculoskeletal: No significant bony findings.  IMPRESSION: 1. Two new 6 mm right ureteral calculi causing moderate right-sided hydronephrosis and hydroureter. 2. Bilateral renal calculi. 3. 6 mm calculus in the left extra renal pelvis but no obstructing left ureteral calculi. 4. 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 5. Status post cholecystectomy. No biliary dilatation. 6. Aortic atherosclerosis.  Aortic Atherosclerosis (ICD10-I70.0).   Electronically Signed By: P.  Gallerani M.D. On: 02/07/2022 10:13   Assessment & Plan:    1. Renal calculus -We discussed the management of kidney stones. These options include observation, ureteroscopy, shockwave lithotripsy (ESWL) and percutaneous nephrolithotomy (PCNL). We discussed which options are relevant to the patient's stone(s). We discussed the natural history of kidney stones as well as the complications of untreated stones and the impact on quality of life without treatment as well as with each of the above listed treatments. We also discussed the efficacy of each treatment in its ability to clear the stone burden. With any of these management options I discussed the signs and symptoms of infection and the need for emergent treatment should these be experienced. For each option we discussed the ability of each procedure to clear the patient of their stone burden.    For observation I described the risks which include but are not limited to silent renal damage, life-threatening infection, need for emergent surgery, failure to pass stone and pain.   For ureteroscopy I described the risks which include bleeding, infection, damage to contiguous structures, positioning injury, ureteral stricture, ureteral avulsion, ureteral injury, need for prolonged ureteral stent, inability to perform ureteroscopy, need for an interval procedure, inability to clear stone burden, stent discomfort/pain, heart attack, stroke, pulmonary embolus and the inherent risks with general anesthesia.   For shockwave lithotripsy I described the risks which include arrhythmia, kidney contusion, kidney hemorrhage, need for transfusion, pain, inability to adequately break up stone, inability to pass stone fragments, Steinstrasse, infection associated with obstructing stones, need for alternate surgical procedure, need for repeat shockwave lithotripsy, MI, CVA, PE and the inherent risks with anesthesia/conscious sedation.   For PCNL I described the risks including positioning injury, pneumothorax, hydrothorax, need for chest tube, inability to clear stone burden, renal laceration, arterial venous fistula or malformation, need for embolization of kidney, loss of kidney or renal function, need for repeat procedure, need for prolonged nephrostomy tube, ureteral avulsion, MI, CVA, PE and the inherent risks of general anesthesia.   - The patient would like to proceed with bilateral ureteroscopic stone extraction - Urinalysis, Routine w reflex microscopic   No   follow-ups on file.  Nicolette Bang, MD  [facility] [place]

## 2022-02-25 NOTE — Progress Notes (Signed)
02/25/2022 9:26 AM   Molly Berry [birth year] [id number]  Referring provider: Nicholes Rough, PA-C [place],  [place] [address]  nephrolithiasis   HPI:  Molly Berry is a 28MK here for followup for nephrolithiasis.  She has bilateral ureteral calculi and underwent stent placement 10/6. She has urinary frequency, urgency and dysuria with the stents in place. She is currently on cefdinir until 10/27. She denies hematuria. She is taking pyridium prn for the dysuria. 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 This could reflect an inflammatory process,  although follow-up is necessary to exclude atypical neoplasm. Full  chest CT should be considered to evaluate for other pulmonary  findings.      Multiple thyroid nodules    Nephropathy    Pneumonia    PONV (postoperative nausea and vomiting)    Pulmonary nodules    Thyroid disease    Ulcer    Uterine cancer ([place]) 12/26/2011   Stage 1, grade 1, S/P hysterectomy initially by robotic technique and then salpingo-oophorectomy at [facility] receiving no postoperative treatment.    Surgical History: Past Surgical History:   Procedure Laterality Date   ABDOMINAL HYSTERECTOMY     endometrial cancer   CHOLECYSTECTOMY     COLONOSCOPY WITH PROPOFOL N/A 01/19/2014   [id number]   CYSTOSCOPY W/ URETERAL STENT PLACEMENT Bilateral 02/07/2022   Procedure: CYSTOSCOPY WITH RETROGRADE PYELOGRAM/RIGHT URETERAL STENT PLACEMENT WITH POSSIBLE LEFT;  Surgeon: Janith Lima, MD;  Location: [facility];  Service: Urology;  Laterality: Bilateral;   DILATION AND CURETTAGE OF UTERUS  12 yrs ago   DILATION AND CURETTAGE OF UTERUS  06/18/2011   Procedure: DILATATION AND CURETTAGE;  Surgeon: Florian Buff, MD;  Location: [facility];  Service: Gynecology;  Laterality: N/A;  Suction Dilation and Curettage   ESOPHAGOGASTRODUODENOSCOPY N/A 10/12/2013   Dr. Rourk:anastomotic ulcer likely cause of bleeding. likely ischemic    ESOPHAGOGASTRODUODENOSCOPY (EGD) WITH PROPOFOL N/A 01/19/2014   YIA:[id number]   EXTRACORPOREAL SHOCK WAVE LITHOTRIPSY Left 01/11/2018   Procedure: LEFT EXTRACORPOREAL SHOCK WAVE LITHOTRIPSY (ESWL);  Surgeon: Lucas Mallow, MD;  Location: [facility];  Service: Urology;  Laterality: Left;   EXTRACORPOREAL SHOCK WAVE LITHOTRIPSY Left 01/14/2018   Procedure: LEFT EXTRACORPOREAL SHOCK WAVE LITHOTRIPSY (ESWL);  Surgeon: Cleon Gustin, MD;  Location: [facility];  Service: Urology;  Laterality: Left;  48 MINS  W/ MAC   GASTRIC BYPASS  2013   [facility]   HYSTEROSCOPY WITH D & C  06/18/2011   Procedure: DILATATION AND CURETTAGE /HYSTEROSCOPY;  Surgeon: Florian Buff, MD;  Location: [facility];  Service: Gynecology;  Laterality: N/A;   LITHOTRIPSY     paniculectomy     SHOULDER ARTHROSCOPY WITH SUBACROMIAL DECOMPRESSION AND OPEN ROTATOR C Left 07/16/2021   Procedure: LEFT ARTHROSCOPIC SUBACROMIAL DECOMPRESSION, MINI OPEN ROTATOR CUFF TEAR REPAIR, BICEPS TENOTOMY;  Surgeon: [name], MD;  Location: [facility];  Service: Orthopedics;  Laterality: Left;   TRIGGER FINGER RELEASE     WISDOM TOOTH EXTRACTION      Home Medications:   Allergies as of 02/25/2022       Reactions   Bee Venom Shortness Of Breath, Swelling, Rash   Cephalexin Anaphylaxis   Cephalexin Anaphylaxis, Itching, Swelling   Ciprofloxacin Hives, Rash, Swelling   hives hives hives   Penicillins Hives, Rash, Itching, Swelling   Has patient had a PCN reaction causing immediate rash, facial/tongue/throat swelling, SOB or lightheadedness with hypotension: Unknown Has patient had a PCN reaction causing severe rash involving mucus membranes or skin necrosis: Unknown Has patient had a PCN reaction that required hospitalization: Unknown Has patient had a PCN reaction occurring within the last 10 years: No If all of the above answers are "NO", then may proceed with Cephalosporin use. Has patient had a PCN reaction causing immediate rash, facial/tongue/throat swelling, SOB or lightheadedness with hypotension: Unknown Has patient had a PCN reaction causing severe rash involving mucus membranes or skin necrosis: Unknown Has patient had a PCN reaction that required hospitalization: Unknown Has patient had a PCN reaction occurring within the last 10 years: No If all of the above answers are "NO", then may proceed with Cephalosporin use.   Vancomycin Anaphylaxis, Hives, Itching   Has taken for bronchitis under medical supervision on site with benadryl Other reaction(s): Other (See Comments) Red man syndrome, gave benadryl to help, per RN.  Slow down infusion with future dose. Has taken for bronchitis under medical supervision on site with benadryl   Carbamazepine    Loss of balance, trembling, inability to think, resulting in ER visit shortly after start of course  Other reaction(s): Other (See Comments) Speech, mind slows down, cant walk Loss of balance, trembling, inability to think, resulting in ER visit shortly after start of course   Cefotaxime Rash   Other reaction(s): Other (See Comments)   Sulfacetamide Sodium Rash   Adhesive [tape] Rash   Clindamycin  Rash   Latex Rash   Sulfa Antibiotics Rash        Medication List        Accurate as of February 25, 2022  9:26 AM. If you have any questions, ask your nurse or doctor.          albuterol 108 (90 Base) MCG/ACT inhaler Commonly known as: VENTOLIN HFA Inhale 2 puffs into the lungs every 6 (six) hours as needed for shortness of breath or wheezing.   ALPRAZolam 1 MG tablet Commonly known as: XANAX Take 1 mg by mouth 3 (three) times daily as needed for anxiety or sleep.   buPROPion 150 MG 12 hr tablet Commonly known as: WELLBUTRIN SR Take 150 mg by mouth 2 (two) times daily.   cefdinir 300 MG capsule Commonly known as: OMNICEF Take 1 capsule (300 mg total) by mouth 2 (two) times daily for 5 days.   cyanocobalamin 1000 MCG tablet Take 1 tablet (1,000 mcg total) by mouth daily.   dicyclomine 20 MG tablet Commonly known as: BENTYL Take 20 mg by mouth  3 (three) times daily as needed for spasms.   diphenhydrAMINE 25 mg capsule Commonly known as: BENADRYL Take 25 mg by mouth at bedtime.   docusate sodium 100 MG capsule Commonly known as: COLACE Take 1 capsule (100 mg total) by mouth daily.   esomeprazole 40 MG capsule Commonly known as: NEXIUM Take 40 mg by mouth 2 (two) times daily before a meal.   famotidine 40 MG tablet Commonly known as: PEPCID Take 40 mg by mouth at bedtime.   ferrous sulfate 325 (65 FE) MG tablet Take 1 tablet (325 mg total) by mouth daily with breakfast.   furosemide 20 MG tablet Commonly known as: LASIX Take 20 mg by mouth daily.   gabapentin 600 MG tablet Commonly known as: NEURONTIN Take 600 mg by mouth See admin instructions. Take 1200 mg by mouth in the morning, and 1200 mg at night   lamoTRIgine 200 MG tablet Commonly known as: LAMICTAL Take 200 mg by mouth 2 (two) times daily.   magic mouthwash (lidocaine, diphenhydrAMINE, alum & mag hydroxide) suspension Swish and spit 5 mLs 3 (three) times daily as needed for mouth pain.    methocarbamol 750 MG tablet Commonly known as: ROBAXIN Take 750 mg by mouth 3 (three) times daily as needed for muscle spasms.   metoprolol succinate 50 MG 24 hr tablet Commonly known as: TOPROL-XL Take 50 mg by mouth daily.   ondansetron 8 MG tablet Commonly known as: ZOFRAN Take 8 mg by mouth every 8 (eight) hours as needed for nausea or vomiting.   oxyCODONE-acetaminophen 10-325 MG tablet Commonly known as: PERCOCET Take 1 tablet by mouth every 4 (four) hours as needed for pain.   phenazopyridine 200 MG tablet Commonly known as: PYRIDIUM Take 1 tablet (200 mg total) by mouth 3 (three) times daily as needed for pain.   simvastatin 20 MG tablet Commonly known as: ZOCOR Take 20 mg by mouth daily.   sucralfate 1 g tablet Commonly known as: CARAFATE Take 1 g by mouth 4 (four) times daily.   SUMAtriptan 100 MG tablet Commonly known as: IMITREX Take 100 mg by mouth every 2 (two) hours as needed for migraine.   tiZANidine 4 MG tablet Commonly known as: ZANAFLEX Take 4 mg by mouth at bedtime.   Vitamin D (Ergocalciferol) 1.25 MG (50000 UNIT) Caps capsule Commonly known as: DRISDOL Take 50,000 Units by mouth once a week.   vortioxetine HBr 20 MG Tabs tablet Commonly known as: TRINTELLIX Take 20 mg by mouth daily.        Allergies:  Allergies  Allergen Reactions   Bee Venom Shortness Of Breath, Swelling and Rash   Cephalexin Anaphylaxis   Cephalexin Anaphylaxis, Itching and Swelling   Ciprofloxacin Hives, Rash and Swelling    hives hives  hives   Penicillins Hives, Rash, Itching and Swelling    Has patient had a PCN reaction causing immediate rash, facial/tongue/throat swelling, SOB or lightheadedness with hypotension: Unknown Has patient had a PCN reaction causing severe rash involving mucus membranes or skin necrosis: Unknown Has patient had a PCN reaction that required hospitalization: Unknown Has patient had a PCN reaction occurring within the last 10  years: No If all of the above answers are "NO", then may proceed with Cephalosporin use. Has patient had a PCN reaction causing immediate rash, facial/tongue/throat swelling, SOB or lightheadedness with hypotension: Unknown Has patient had a PCN reaction causing severe rash involving mucus membranes or skin necrosis: Unknown Has patient had a PCN reaction that required  hospitalization: Unknown Has patient had a PCN reaction occurring within the last 10 years: No If all of the above answers are "NO", then may proceed with Cephalosporin use.   Vancomycin Anaphylaxis, Hives and Itching    Has taken for bronchitis under medical supervision on site with benadryl Other reaction(s): Other (See Comments) Red man syndrome, gave benadryl to help, per RN.  Slow down infusion with future dose.  Has taken for bronchitis under medical supervision on site with benadryl   Carbamazepine     Loss of balance, trembling, inability to think, resulting in ER visit shortly after start of course  Other reaction(s): Other (See Comments) Speech, mind slows down, cant walk  Loss of balance, trembling, inability to think, resulting in ER visit shortly after start of course   Cefotaxime Rash    Other reaction(s): Other (See Comments)   Sulfacetamide Sodium Rash   Adhesive [Tape] Rash   Clindamycin Rash   Latex Rash   Sulfa Antibiotics Rash    Family History: Family History  Problem Relation Age of [place]   Pneumonia Mother        Deceased   Arthritis Mother    Asthma Mother    Cancer Mother        pancreatic   COPD Mother    Depression Mother    Diabetes Mother    Kidney disease Mother    Liver cancer Father        Living   Arthritis Father    Cancer Father        liver, prostate   COPD Father    Depression Father    Stroke Father    Heart disease Maternal Grandmother    Mental illness Maternal Grandmother    Breast cancer Maternal Grandmother    Alcohol abuse Paternal Grandfather    Breast cancer  Paternal Aunt    Anesthesia problems Neg Hx    Hypotension Neg Hx    Malignant hyperthermia Neg Hx    Pseudochol deficiency Neg Hx    Colon cancer Neg Hx     Social History:  reports that she has never smoked. She has never used smokeless tobacco. She reports that she does not drink alcohol and does not use drugs.  ROS: All other review of systems were reviewed and are negative except what is noted above in HPI  Physical Exam: BP 111/75   Pulse 87   LMP 05/06/2011   Constitutional:  Alert and oriented, No acute distress. HEENT: [place] AT, moist mucus membranes.  Trachea midline, no masses. Cardiovascular: No clubbing, cyanosis, or edema. Respiratory: Normal respiratory effort, no increased work of breathing. GI: Abdomen is soft, nontender, nondistended, no abdominal masses GU: No CVA tenderness.  Lymph: No cervical or inguinal lymphadenopathy. Skin: No rashes, bruises or suspicious lesions. Neurologic: Grossly intact, no focal deficits, moving all 4 extremities. Psychiatric: Normal mood and affect.  Laboratory Data: Lab Results  Component Value Date   WBC 5.8 02/21/2022   HGB 10.1 (L) 02/21/2022   HCT 35.3 (L) 02/21/2022   MCV 90.5 02/21/2022   PLT 364 02/21/2022    Lab Results  Component Value Date   CREATININE 0.87 02/21/2022    No results found for: "PSA"  No results found for: "TESTOSTERONE"  Lab Results  Component Value Date   HGBA1C 6.4 (H) 02/07/2022    Urinalysis    Component Value Date/Time   COLORURINE AMBER (A) 02/19/2022 0839   APPEARANCEUR HAZY (A) 02/19/2022 0839   LABSPEC 1.020  02/19/2022 0839   PHURINE 5.0 02/19/2022 0839   GLUCOSEU NEGATIVE 02/19/2022 0839   HGBUR MODERATE (A) 02/19/2022 0839   BILIRUBINUR NEGATIVE 02/19/2022 0839   BILIRUBINUR negative 08/06/2021 0849   KETONESUR NEGATIVE 02/19/2022 0839   PROTEINUR 100 (A) 02/19/2022 0839   UROBILINOGEN 1.0 08/06/2021 0849   UROBILINOGEN 1.0 01/16/2015 0149   NITRITE POSITIVE (A)  02/19/2022 0839   LEUKOCYTESUR NEGATIVE 02/19/2022 0839    Lab Results  Component Value Date   BACTERIA NONE SEEN 02/19/2022    Pertinent Imaging: Ct 02/07/2022: Images reviewed and discussed with the patient  Results for orders placed during the hospital encounter of 01/14/18  DG Abd 1 View  Narrative CLINICAL DATA:  Patient for lithotripsy today.  EXAM: ABDOMEN - 1 VIEW  COMPARISON:  CT abdomen and pelvis 12/30/2017.  KUB 01/11/2018.  FINDINGS: Although visualization is somewhat difficult due to gas and stool, stone within the left renal pelvis is unchanged. 2 small stones in the upper pole of the right kidney are also unchanged. No evidence of ureteral stone is identified. Bowel gas pattern is nonobstructive.  IMPRESSION: Stone in the left renal pelvis and 2 stones in the upper pole of the right kidney are unchanged. Visualization is limited due to extensive gas and stool.   Electronically Signed By: Inge Rise M.D. On: 01/14/2018 13:47  No results found for this or any previous visit.  No results found for this or any previous visit.  No results found for this or any previous visit.  Results for orders placed during the hospital encounter of 02/15/15  US Renal  Narrative CLINICAL DATA:  Episode of acute cystitis 1 month ago, history of kidney stones and lithotripsy for same.  EXAM: RENAL / URINARY TRACT ULTRASOUND COMPLETE  COMPARISON:  Abdominal and pelvic CT scan of November 18, 2013  FINDINGS: Right Kidney:  Length: 12.3 cm. The renal cortical echotexture is normal. There is no hydronephrosis. No masses are observed. No definite stones are appreciated  Left Kidney:  Length: 12.7 cm. The renal cortical echotexture is normal. There is no cystic or solid mass. There is no hydronephrosis. No definite stones are appreciated.  Bladder:  The bladder was difficult to visualize due to the patient's body habitus. Ureteral jets could not be  confirmed.  IMPRESSION: 1. No acute or significant chronic abnormality of the kidneys is observed. Stones were demonstrated bilaterally on the July 2015 CT scan but are not definitely appreciated today. 2. Evaluation of the bladder was limited.   Electronically Signed By: David  Martinique M.D. On: 02/15/2015 15:57  No valid procedures specified. No results found for this or any previous visit.  Results for orders placed during the hospital encounter of 02/06/22  CT RENAL STONE STUDY  Narrative CLINICAL DATA:  Right flank pain.  EXAM: CT ABDOMEN AND PELVIS WITHOUT CONTRAST  TECHNIQUE: Multidetector CT imaging of the abdomen and pelvis was performed following the standard protocol without IV contrast.  RADIATION DOSE REDUCTION: This exam was performed according to the departmental dose-optimization program which includes automated exposure control, adjustment of the mA and/or kV according to patient size and/or use of iterative reconstruction technique.  COMPARISON:  02/04/2022  FINDINGS: Lower chest: Bibasilar scarring and atelectasis. No pleural effusions. The heart is normal in size. No pericardial effusion.  Hepatobiliary: No hepatic lesions or intrahepatic biliary dilatation. The gallbladder is surgically absent. No common bile duct dilatation.  Pancreas: No mass, inflammation ductal dilatation.  Spleen: Normal size.  No focal lesions.  Adrenals/Urinary Tract: The adrenal glands are normal.  New moderate right-sided hydronephrosis and hydroureter. There are 2 ureteral calculi. The more proximal calculus is at the L5 level and measures a maximum of or 6 mm. The more distal calculus is located at the mid sacral level and also measures 6 mm.  Bilateral renal calculi. There is a 6 mm calculus in the left extra renal pelvis but no obstructing left ureteral calculi. No bladder calculi. No worrisome renal or bladder lesions are identified without  contrast.  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 The duodenum, small bowel and colon unremarkable.  Vascular/Lymphatic: Stable scattered atherosclerotic calcifications involving the aorta and iliac arteries but no aneurysm. No mesenteric or retroperitoneal lymphadenopathy. A retroaortic left renal vein is noted incidentally.  Reproductive: Surgically absent.  Other: Small amount of perinephric fluid on the right side also tracking down along the right ureter and into the pelvis.  Musculoskeletal: No significant bony findings.  IMPRESSION: 1. Two new 6 mm right ureteral calculi causing moderate right-sided hydronephrosis and hydroureter. 2. Bilateral renal calculi. 3. 6 mm calculus in the left extra renal pelvis but no obstructing left ureteral calculi. 4. 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 5. Status post cholecystectomy. No biliary dilatation. 6. Aortic atherosclerosis.  Aortic Atherosclerosis (ICD10-I70.0).   Electronically Signed By: Marijo Sanes M.D. On: 02/07/2022 10:13   Assessment & Plan:    1. Renal calculus -We discussed the management of kidney stones. These options include observation, ureteroscopy, shockwave lithotripsy (ESWL) and percutaneous nephrolithotomy (PCNL). We discussed which options are relevant to the patient's stone(s). We discussed the natural history of kidney stones as well as the complications of untreated stones and the impact on quality of life without treatment as well as with each of the above listed treatments. We also discussed the efficacy of each treatment in its ability to clear the stone burden. With any of these management options I discussed the signs and symptoms of infection and the need for emergent treatment should these be experienced. For each option we discussed the ability of each procedure to clear the patient of their stone burden.    For observation I described the risks which include but are not limited to silent renal damage, life-threatening infection, need for emergent surgery, failure to pass stone and pain.   For ureteroscopy I described the risks which include bleeding, infection, damage to contiguous structures, positioning injury, ureteral stricture, ureteral avulsion, ureteral injury, need for prolonged ureteral stent, inability to perform ureteroscopy, need for an interval procedure, inability to clear stone burden, stent discomfort/pain, heart attack, stroke, pulmonary embolus and the inherent risks with general anesthesia.   For shockwave lithotripsy I described the risks which include arrhythmia, kidney contusion, kidney hemorrhage, need for transfusion, pain, inability to adequately break up stone, inability to pass stone fragments, Steinstrasse, infection associated with obstructing stones, need for alternate surgical procedure, need for repeat shockwave lithotripsy, MI, CVA, PE and the inherent risks with anesthesia/conscious sedation.   For PCNL I described the risks including positioning injury, pneumothorax, hydrothorax, need for chest tube, inability to clear stone burden, renal laceration, arterial venous fistula or malformation, need for embolization of kidney, loss of kidney or renal function, need for repeat procedure, need for prolonged nephrostomy tube, ureteral avulsion, MI, CVA, PE and the inherent risks of general anesthesia.   - The patient would like to proceed with bilateral ureteroscopic stone extraction - Urinalysis, Routine w reflex microscopic   No  follow-ups on file.  Nicolette Bang, MD  [facility] [place]

## 2022-02-25 NOTE — Telephone Encounter (Signed)
Patient called stating pharmacy could not fill oxycodone due to it being on back order. Pharmacy called and verified. Please send in alternative.

## 2022-02-26 ENCOUNTER — Other Ambulatory Visit: Payer: Self-pay | Admitting: Urology

## 2022-02-26 MED ORDER — OXYCODONE-ACETAMINOPHEN 7.5-325 MG PO TABS: 1.0000 | ORAL_TABLET | ORAL | 0 refills | Status: DC | PRN

## 2022-02-27 LAB — GLUCOSE, CAPILLARY: Glucose-Capillary: 92 mg/dL (ref 70–99)

## 2022-02-28 ENCOUNTER — Telehealth: Payer: Self-pay

## 2022-02-28 NOTE — Telephone Encounter (Signed)
Patient states the rx sent to pharmacy is not helping her pain.  She is asking if there is another rx that can be sent to pharmacy.

## 2022-03-03 ENCOUNTER — Encounter (HOSPITAL_COMMUNITY): Payer: Self-pay

## 2022-03-03 ENCOUNTER — Encounter (HOSPITAL_COMMUNITY)
Admission: RE | Admit: 2022-03-03 | Discharge: 2022-03-03 | Disposition: A | Discharge status: Home / Self Care | Payer: 59 | Source: Ambulatory Visit | Attending: Urology | Admitting: Urology

## 2022-03-04 ENCOUNTER — Other Ambulatory Visit: Payer: Self-pay | Admitting: Urology

## 2022-03-04 MED ORDER — HYDROMORPHONE HCL 2 MG PO TABS: 2.0000 mg | ORAL_TABLET | ORAL | 0 refills | Status: DC | PRN

## 2022-03-05 ENCOUNTER — Telehealth: Payer: Self-pay

## 2022-03-05 MED ORDER — GENTAMICIN SULFATE 40 MG/ML IJ SOLN
5.0000 mg/kg | INTRAVENOUS | Status: AC
  Administered 2022-03-06: 180 mg via INTRAVENOUS
  Filled 2022-03-05: qty 10

## 2022-03-05 NOTE — Telephone Encounter (Signed)
Patient wanting a call back to find out details about preparing for the procedure in the morning.  Call back at:  [phone]  Thanks, Helene Kelp

## 2022-03-05 NOTE — Telephone Encounter (Signed)
Patient was seen in office today and the PA Nicholes Rough advised she currently has an infection. They wanted to make you aware prior to appt with you tomorrow.

## 2022-03-06 ENCOUNTER — Ambulatory Visit (HOSPITAL_BASED_OUTPATIENT_CLINIC_OR_DEPARTMENT_OTHER): Payer: 59 | Admitting: Anesthesiology

## 2022-03-06 ENCOUNTER — Ambulatory Visit (HOSPITAL_COMMUNITY): Payer: 59 | Admitting: Anesthesiology

## 2022-03-06 ENCOUNTER — Ambulatory Visit (HOSPITAL_COMMUNITY)
Admission: RE | Admit: 2022-03-06 | Discharge: 2022-03-06 | Disposition: A | Discharge status: Home / Self Care | Payer: 59 | Attending: Urology | Admitting: Urology

## 2022-03-06 ENCOUNTER — Encounter (HOSPITAL_COMMUNITY)
Admission: RE | Disposition: A | Discharge status: Home / Self Care | Payer: Self-pay | Source: Home / Self Care | Attending: Urology

## 2022-03-06 ENCOUNTER — Other Ambulatory Visit: Payer: Self-pay

## 2022-03-06 ENCOUNTER — Ambulatory Visit (HOSPITAL_COMMUNITY): Payer: 59

## 2022-03-06 DIAGNOSIS — Z6841 Body Mass Index (BMI) 40.0 and over, adult: Secondary | ICD-10-CM | POA: Diagnosis not present

## 2022-03-06 DIAGNOSIS — M797 Fibromyalgia: Secondary | ICD-10-CM | POA: Insufficient documentation

## 2022-03-06 DIAGNOSIS — N200 Calculus of kidney: Secondary | ICD-10-CM

## 2022-03-06 DIAGNOSIS — J449 Chronic obstructive pulmonary disease, unspecified: Secondary | ICD-10-CM | POA: Insufficient documentation

## 2022-03-06 DIAGNOSIS — R519 Headache, unspecified: Secondary | ICD-10-CM | POA: Insufficient documentation

## 2022-03-06 DIAGNOSIS — E119 Type 2 diabetes mellitus without complications: Secondary | ICD-10-CM | POA: Insufficient documentation

## 2022-03-06 DIAGNOSIS — Z79899 Other long term (current) drug therapy: Secondary | ICD-10-CM | POA: Insufficient documentation

## 2022-03-06 DIAGNOSIS — K219 Gastro-esophageal reflux disease without esophagitis: Secondary | ICD-10-CM | POA: Insufficient documentation

## 2022-03-06 DIAGNOSIS — Z7984 Long term (current) use of oral hypoglycemic drugs: Secondary | ICD-10-CM | POA: Diagnosis not present

## 2022-03-06 DIAGNOSIS — Z8542 Personal history of malignant neoplasm of other parts of uterus: Secondary | ICD-10-CM | POA: Insufficient documentation

## 2022-03-06 DIAGNOSIS — F1120 Opioid dependence, uncomplicated: Secondary | ICD-10-CM | POA: Diagnosis not present

## 2022-03-06 DIAGNOSIS — D649 Anemia, unspecified: Secondary | ICD-10-CM | POA: Insufficient documentation

## 2022-03-06 DIAGNOSIS — N202 Calculus of kidney with calculus of ureter: Secondary | ICD-10-CM

## 2022-03-06 DIAGNOSIS — F418 Other specified anxiety disorders: Secondary | ICD-10-CM

## 2022-03-06 DIAGNOSIS — I10 Essential (primary) hypertension: Secondary | ICD-10-CM | POA: Diagnosis not present

## 2022-03-06 DIAGNOSIS — F32A Depression, unspecified: Secondary | ICD-10-CM | POA: Diagnosis not present

## 2022-03-06 DIAGNOSIS — N201 Calculus of ureter: Secondary | ICD-10-CM

## 2022-03-06 DIAGNOSIS — F419 Anxiety disorder, unspecified: Secondary | ICD-10-CM | POA: Diagnosis not present

## 2022-03-06 HISTORY — PX: HOLMIUM LASER APPLICATION: SHX5852

## 2022-03-06 HISTORY — PX: STONE EXTRACTION WITH BASKET: SHX5318

## 2022-03-06 HISTORY — PX: CYSTOSCOPY WITH RETROGRADE PYELOGRAM, URETEROSCOPY AND STENT PLACEMENT: SHX5789

## 2022-03-06 LAB — GLUCOSE, CAPILLARY: Glucose-Capillary: 93 mg/dL (ref 70–99)

## 2022-03-06 SURGERY — CYSTOURETEROSCOPY, WITH RETROGRADE PYELOGRAM AND STENT INSERTION
Anesthesia: General | Site: Ureter | Laterality: Bilateral

## 2022-03-06 MED ORDER — ONDANSETRON HCL 4 MG/2ML IJ SOLN
INTRAMUSCULAR | Status: AC
  Filled 2022-03-06: qty 4

## 2022-03-06 MED ORDER — DIATRIZOATE MEGLUMINE 30 % UR SOLN
URETHRAL | Status: AC
  Filled 2022-03-06: qty 100

## 2022-03-06 MED ORDER — MEPERIDINE HCL 50 MG/ML IJ SOLN: 6.2500 mg | INTRAMUSCULAR | Status: DC | PRN

## 2022-03-06 MED ORDER — NITROFURANTOIN MONOHYD MACRO 100 MG PO CAPS: 100.0000 mg | ORAL_CAPSULE | Freq: Two times a day (BID) | ORAL | 0 refills | Status: DC

## 2022-03-06 MED ORDER — NITROFURANTOIN MONOHYD MACRO 100 MG PO CAPS: 100.0000 mg | ORAL_CAPSULE | Freq: Two times a day (BID) | ORAL | 0 refills | Status: AC

## 2022-03-06 MED ORDER — ONDANSETRON HCL 4 MG/2ML IJ SOLN: 4.0000 mg | Freq: Once | INTRAMUSCULAR | Status: DC | PRN

## 2022-03-06 MED ORDER — ROCURONIUM BROMIDE 10 MG/ML (PF) SYRINGE
PREFILLED_SYRINGE | INTRAVENOUS | Status: AC
  Filled 2022-03-06: qty 10

## 2022-03-06 MED ORDER — SODIUM CHLORIDE 0.9 % IR SOLN
Status: DC | PRN
  Administered 2022-03-06 (×2): 3000 mL

## 2022-03-06 MED ORDER — CHLORHEXIDINE GLUCONATE 0.12 % MT SOLN
15.0000 mL | Freq: Once | OROMUCOSAL | Status: AC
  Administered 2022-03-06: 15 mL via OROMUCOSAL

## 2022-03-06 MED ORDER — ONDANSETRON HCL 8 MG PO TABS: 8.0000 mg | ORAL_TABLET | Freq: Three times a day (TID) | ORAL | 1 refills | Status: AC | PRN

## 2022-03-06 MED ORDER — FENTANYL CITRATE (PF) 100 MCG/2ML IJ SOLN
INTRAMUSCULAR | Status: AC
  Filled 2022-03-06: qty 2

## 2022-03-06 MED ORDER — ORAL CARE MOUTH RINSE: 15.0000 mL | Freq: Once | OROMUCOSAL | Status: AC

## 2022-03-06 MED ORDER — DIATRIZOATE MEGLUMINE 30 % UR SOLN
URETHRAL | Status: DC | PRN
  Administered 2022-03-06: 10 mL via URETHRAL

## 2022-03-06 MED ORDER — HYDROMORPHONE HCL 1 MG/ML IJ SOLN
0.2500 mg | INTRAMUSCULAR | Status: DC | PRN
  Administered 2022-03-06 (×2): 0.5 mg via INTRAVENOUS
  Filled 2022-03-06 (×2): qty 0.5

## 2022-03-06 MED ORDER — LIDOCAINE HCL (CARDIAC) PF 50 MG/5ML IV SOSY
PREFILLED_SYRINGE | INTRAVENOUS | Status: DC | PRN
  Administered 2022-03-06: 100 mg via INTRAVENOUS

## 2022-03-06 MED ORDER — PROPOFOL 10 MG/ML IV BOLUS
INTRAVENOUS | Status: AC
  Filled 2022-03-06: qty 20

## 2022-03-06 MED ORDER — PROPOFOL 10 MG/ML IV BOLUS
INTRAVENOUS | Status: DC | PRN
  Administered 2022-03-06: 200 mg via INTRAVENOUS

## 2022-03-06 MED ORDER — LIDOCAINE HCL (PF) 2 % IJ SOLN
INTRAMUSCULAR | Status: AC
  Filled 2022-03-06: qty 10

## 2022-03-06 MED ORDER — WATER FOR IRRIGATION, STERILE IR SOLN
Status: DC | PRN
  Administered 2022-03-06: 500 mL

## 2022-03-06 MED ORDER — ONDANSETRON HCL 4 MG/2ML IJ SOLN
INTRAMUSCULAR | Status: DC | PRN
  Administered 2022-03-06: 4 mg via INTRAVENOUS

## 2022-03-06 MED ORDER — LACTATED RINGERS IV SOLN: INTRAVENOUS | Status: DC

## 2022-03-06 MED ORDER — ROCURONIUM 10MG/ML (10ML) SYRINGE FOR MEDFUSION PUMP - OPTIME
INTRAVENOUS | Status: DC | PRN
  Administered 2022-03-06: 40 mg via INTRAVENOUS

## 2022-03-06 MED ORDER — EPHEDRINE 5 MG/ML INJ
INTRAVENOUS | Status: AC
  Filled 2022-03-06: qty 5

## 2022-03-06 MED ORDER — HYDROMORPHONE HCL 2 MG PO TABS: 2.0000 mg | ORAL_TABLET | ORAL | 0 refills | Status: DC | PRN

## 2022-03-06 MED ORDER — DEXAMETHASONE SODIUM PHOSPHATE 10 MG/ML IJ SOLN
INTRAMUSCULAR | Status: AC
  Filled 2022-03-06: qty 1

## 2022-03-06 MED ORDER — EPHEDRINE SULFATE (PRESSORS) 50 MG/ML IJ SOLN
INTRAMUSCULAR | Status: DC | PRN
  Administered 2022-03-06: 10 mg via INTRAVENOUS

## 2022-03-06 MED ORDER — FENTANYL CITRATE (PF) 100 MCG/2ML IJ SOLN
INTRAMUSCULAR | Status: DC | PRN
  Administered 2022-03-06: 50 ug via INTRAVENOUS

## 2022-03-06 SURGICAL SUPPLY — 25 items
BAG DRAIN URO TABLE W/ADPT NS (BAG) ×2 IMPLANT
BAG DRN 8 ADPR NS SKTRN CSTL (BAG) ×2
BAG HAMPER (MISCELLANEOUS) ×2 IMPLANT
CATH INTERMIT  6FR 70CM (CATHETERS) ×2 IMPLANT
CLOTH BEACON ORANGE TIMEOUT ST (SAFETY) ×2 IMPLANT
EXTRACTOR STONE NITINOL NGAGE (UROLOGICAL SUPPLIES) IMPLANT
GLOVE BIO SURGEON STRL SZ8 (GLOVE) ×2 IMPLANT
GLOVE BIOGEL PI IND STRL 7.0 (GLOVE) ×4 IMPLANT
GOWN STRL REUS W/TWL LRG LVL3 (GOWN DISPOSABLE) ×2 IMPLANT
GOWN STRL REUS W/TWL XL LVL3 (GOWN DISPOSABLE) ×2 IMPLANT
GUIDEWIRE STR DUAL SENSOR (WIRE) ×2 IMPLANT
GUIDEWIRE STR ZIPWIRE 035X150 (MISCELLANEOUS) ×2 IMPLANT
IV NS IRRIG 3000ML ARTHROMATIC (IV SOLUTION) ×4 IMPLANT
KIT TURNOVER CYSTO (KITS) ×2 IMPLANT
MANIFOLD NEPTUNE II (INSTRUMENTS) ×2 IMPLANT
PACK CYSTO (CUSTOM PROCEDURE TRAY) ×2 IMPLANT
PAD ARMBOARD 7.5X6 YLW CONV (MISCELLANEOUS) ×2 IMPLANT
SHEATH NAVIGATOR HD 11/13X36 (SHEATH) IMPLANT
STENT URET 6FRX26 CONTOUR (STENTS) IMPLANT
SYR 10ML LL (SYRINGE) ×2 IMPLANT
SYR CONTROL 10ML LL (SYRINGE) ×2 IMPLANT
TOWEL OR 17X26 4PK STRL BLUE (TOWEL DISPOSABLE) ×2 IMPLANT
TRACTIP FLEXIVA PULS ID 200XHI (Laser) IMPLANT
TRACTIP FLEXIVA PULSE ID 200 (Laser) ×2
WATER STERILE IRR 500ML POUR (IV SOLUTION) ×2 IMPLANT

## 2022-03-06 NOTE — Anesthesia Procedure Notes (Addendum)
Procedure Name: Intubation Date/Time: 03/06/2022 10:40 AM  Performed by: Ollen Bowl, CRNAPre-anesthesia Checklist: Patient identified, Patient being monitored, Timeout performed, Emergency Drugs available and Suction available Patient Re-evaluated:Patient Re-evaluated prior to induction Oxygen Delivery Method: Circle system utilized Preoxygenation: Pre-oxygenation with 100% oxygen Induction Type: IV induction Ventilation: Mask ventilation without difficulty Laryngoscope Size: Mac and 3 Grade View: Grade I Tube type: Oral Tube size: 7.0 mm Number of attempts: 1 Airway Equipment and Method: Stylet Placement Confirmation: ETT inserted through vocal cords under direct vision, positive ETCO2 and breath sounds checked- equal and bilateral Secured at: 21 cm Tube secured with: Tape Dental Injury: Teeth and Oropharynx as per pre-operative assessment

## 2022-03-06 NOTE — Op Note (Signed)
Marland KitchenPreoperative diagnosis: bilateral renal calculi, right ureteral calculi  Postoperative diagnosis: Same  Procedure: 1 cystoscopy 2. Bilateral retrograde pyelography 3.  Intraoperative fluoroscopy, under one hour, with interpretation 4.  Bilateral ureteroscopic stone manipulation with laser lithotripsy 5.  bilateral 6 x 26 JJ stent placement  Attending: Rosie Fate  Anesthesia: General  Estimated blood loss: None  Drains: bilateral 6 x 26 JJ ureteral stent with tether  Specimens: stone for analysis  Antibiotics: Gentamicin  Findings: bilateral lower pole renal calculi. 2 right proximal ureteral calculi. No hydronephrosis. No masses/lesions in the bladder. Ureteral orifices in normal anatomic location.  Indications: Patient is a 55 year old female with a history of bilateral ureteral calculi who underwent bilateral stent placement 3 weeks ago. After discussing treatment options, they decided proceed with bilateral ureteroscopic stone manipulation.  Procedure in detail: The patient was brought to the operating room and a brief timeout was done to ensure correct patient, correct procedure, correct site.  General anesthesia was administered patient was placed in dorsal lithotomy position.  Her genitalia was then prepped and draped in usual sterile fashion.  A rigid 32 French cystoscope was passed in the urethra and the bladder.  Bladder was inspected free masses or lesions.  the ureteral orifices were in the normal orthotopic locations. a 6 french ureteral catheter was then instilled into the left ureteral orifice.  a gentle retrograde was obtained and findings noted above. Using a grasper the left ureteral stent was brought to the urethral meatus. We then advanced a zipwire through the ureteral stent and up to the renal pelvis.  The stent was removed. we then removed the cystoscope and cannulated the left ureteral orifice with a semirigid ureteroscope.  We located no stone in the ureter.  We then placed a sensor wire up to the renal pelvis. We removed the scope and advanced a 11/13 x 36cm access sheath up to the renal pelvis. We then used the flexible ureteroscope to perform nephroscopy. We located a calculus in the lower pole which was fragmented using a 242nm laser fiber. The fragments were removed with an NGage basket. Once the stone were removed we then removed the access sheath under direct vision and noted to injury to the ureter.  We then placed a 6 x 26 double-j ureteral stent over the original zip wire. We then removed the wire and good coil was noted in the the renal pelvis under fluoroscopy and the bladder under direct vision.  We then turned out attention to the right side. a 6 french ureteral catheter was then instilled into the right ureteral orifice.  a gentle retrograde was obtained and findings noted above. Using a grasper the right ureteral stent was brought to the urethral meatus. We then advanced a zipwire through the ureteral stent and up to the renal pelvis.  The stent was removed. we then removed the cystoscope and cannulated the right ureteral orifice with a semirigid ureteroscope.  Encountered 2 proximal ureteral calculi which were fragmented with a 242nm laser fiber. The fragments were then removed with an NGage basket. Repeat ureteroscopy to the UPJ yielded no additional calculi. We then placed a sensor wire up to the renal pelvis. We removed the scope and advanced a 11/13 x 36cm access sheath up to the renal pelvis. We then used the flexible ureteroscope to perform nephroscopy. We located calculi in the mid and lower poles which were removed with an NGage basket. Once the stone were removed we then removed the access sheath under direct vision  and noted to injury to the ureter. we then placed a 6 x 26 double-j ureteral stent over the original zip wire.  We then removed the wire and good coil was noted in the the renal pelvis under fluoroscopy and the bladder under direct  vision.   the bladder was then drained and this concluded the procedure which was well tolerated by patient.  Complications: None  Condition: Stable, extubated, transferred to PACU  Plan: Patient is to be discharged home as to follow-up in 2 weeks. She is to remove her stents by pulling the tehter in 72 hours

## 2022-03-06 NOTE — Anesthesia Postprocedure Evaluation (Signed)
Anesthesia Post Note  Patient: Molly Berry  Procedure(s) Performed: CYSTOSCOPY WITH RETROGRADE PYELOGRAM, URETEROSCOPY AND STENT EXCHANGE (Bilateral) HOLMIUM LASER APPLICATION (Bilateral) STONE EXTRACTION WITH BASKET (Bilateral: Ureter)  Patient location during evaluation: Phase II Anesthesia Type: General Level of consciousness: awake and alert and oriented Pain management: pain level controlled Vital Signs Assessment: post-procedure vital signs reviewed and stable Respiratory status: spontaneous breathing, nonlabored ventilation and respiratory function stable Cardiovascular status: blood pressure returned to baseline and stable Postop Assessment: no apparent nausea or vomiting Anesthetic complications: no   No notable events documented.   Last Vitals:  Vitals:   03/06/22 1215 03/06/22 1230  BP: 133/73 136/77  Pulse: 65 61  Resp: 13 16  Temp:    SpO2: 95% 98%    Last Pain:  Vitals:   03/06/22 1230  PainSc: Asleep                 Kourtnee Lahey C Daevon Holdren

## 2022-03-06 NOTE — Transfer of Care (Signed)
Immediate Anesthesia Transfer of Care Note  Patient: Molly Berry  Procedure(s) Performed: CYSTOSCOPY WITH RETROGRADE PYELOGRAM, URETEROSCOPY AND STENT EXCHANGE (Bilateral) HOLMIUM LASER APPLICATION (Bilateral) STONE EXTRACTION WITH BASKET (Bilateral: Ureter)  Patient Location: PACU  Anesthesia Type:General  Level of Consciousness: awake  Airway & Oxygen Therapy: Patient Spontanous Breathing  Post-op Assessment: Report given to RN  Post vital signs: Reviewed  Last Vitals:  Vitals Value Taken Time  BP 126/70 03/06/22 1157  Temp 98.2   Pulse 62 03/06/22 1159  Resp 14 03/06/22 1159  SpO2 90 % 03/06/22 1159  Vitals shown include unvalidated device data.  Last Pain:  Vitals:   03/06/22 0934  PainSc: 0-No pain         Complications: No notable events documented.

## 2022-03-06 NOTE — Discharge Instructions (Addendum)
Pull both stents in 72 hours post-op (03/09/2022)  Follow-up with Dr. Alyson Ingles as scheduled

## 2022-03-06 NOTE — Progress Notes (Signed)
Bilateral stent strings noted and taped to groins

## 2022-03-06 NOTE — Progress Notes (Signed)
Macrobid sig states take 1 capsule po BID x7 days, pharmacy called stating they needed a new rx with qty 14 since the rx sent in only had 10 capsules.  Confirmed with Dr. Alyson Ingles patient is to take 1 cap po BID x5 days until she gets her stent removed.  Sig updated on rx and pharmacy notified.

## 2022-03-06 NOTE — Anesthesia Preprocedure Evaluation (Addendum)
Anesthesia Evaluation  Patient identified by MRN, date of birth, ID band Patient awake    Reviewed: Allergy & Precautions, H&P , NPO status , Patient's Chart, lab work & pertinent test results, reviewed documented beta blocker date and time   History of Anesthesia Complications (+) PONV and history of anesthetic complications (last few times 5 or 6 hours after the procedure she ended up in ER with SOB and chest pain)  Airway Mallampati: II  TM Distance: >3 FB Neck ROM: Full    Dental  (+) Dental Advisory Given, Edentulous Upper, Edentulous Lower   Pulmonary asthma , pneumonia, COPD,  COPD inhaler   Pulmonary exam normal breath sounds clear to auscultation       Cardiovascular Exercise Tolerance: Poor hypertension, Pt. on medications and Pt. on home beta blockers Normal cardiovascular exam Rhythm:Regular Rate:Normal  19-Feb-2022 09:05:16 [facility] ROUTINE RECORD Aug 20, 1965 (60 yr) Female Caucasian Vent. rate 72 BPM PR interval 179 ms QRS duration 90 ms QT/QTcB 396/434 ms P-R-T axes 44 -25 30 Sinus rhythm Borderline left axis deviation Low voltage, precordial leads Consider anterior infarct   Neuro/Psych  Headaches PSYCHIATRIC DISORDERS Anxiety Depression     Neuromuscular disease    GI/Hepatic Neg liver ROS,GERD  Medicated and Poorly Controlled,,  Endo/Other  diabetes, Well Controlled, Type 2, Oral Hypoglycemic AgentsHypothyroidism  Morbid obesity  Renal/GU Renal InsufficiencyRenal disease  Female GU complaint (endometrial cancer)     Musculoskeletal  (+) Arthritis , Osteoarthritis,  Fibromyalgia -, narcotic dependent  Abdominal   Peds negative pediatric ROS (+)  Hematology  (+) Blood dyscrasia, anemia   Anesthesia Other Findings   Reproductive/Obstetrics negative OB ROS                             Anesthesia Physical Anesthesia Plan  ASA: 3  Anesthesia  Plan: General   Post-op Pain Management: Dilaudid IV   Induction: Intravenous, Rapid sequence and Cricoid pressure planned  PONV Risk Score and Plan: 4 or greater and Ondansetron and Dexamethasone  Airway Management Planned: Oral ETT  Additional Equipment:   Intra-op Plan:   Post-operative Plan: Extubation in OR  Informed Consent: I have reviewed the patients History and Physical, chart, labs and discussed the procedure including the risks, benefits and alternatives for the proposed anesthesia with the patient or authorized representative who has indicated his/her understanding and acceptance.       Plan Discussed with: CRNA and Surgeon  Anesthesia Plan Comments:        Anesthesia Quick Evaluation

## 2022-03-06 NOTE — Interval H&P Note (Signed)
History and Physical Interval Note:  03/06/2022 10:04 AM  Molly Berry  has presented today for surgery, with the diagnosis of bilateral ureteral calculi.  The various methods of treatment have been discussed with the patient and family. After consideration of risks, benefits and other options for treatment, the patient has consented to  Procedure(s): CYSTOSCOPY WITH RETROGRADE PYELOGRAM, URETEROSCOPY AND STENT EXCHANGE (Bilateral) HOLMIUM LASER APPLICATION (Bilateral) as a surgical intervention.  The patient's history has been reviewed, patient examined, no change in status, stable for surgery.  I have reviewed the patient's chart and labs.  Questions were answered to the patient's satisfaction.     Nicolette Bang

## 2022-03-07 NOTE — Telephone Encounter (Signed)
F/u with patient call concerning her phone call.  She had already had her procedure with Dr. Alyson Ingles on 11/02, nothing needed at this time.  I confirmed the patient that the pharmacy had filled her macrobid rx for 7 days and she was aware she was to take the rx BID x5 days.  Patient did receive the rx and voiced understanding on directions.  She will f/u as scheduled.

## 2022-03-10 ENCOUNTER — Ambulatory Visit (INDEPENDENT_AMBULATORY_CARE_PROVIDER_SITE_OTHER): Payer: Self-pay

## 2022-03-10 ENCOUNTER — Ambulatory Visit: Payer: 59 | Admitting: Physician Assistant

## 2022-03-10 DIAGNOSIS — M1712 Unilateral primary osteoarthritis, left knee: Secondary | ICD-10-CM

## 2022-03-10 MED ORDER — METHYLPREDNISOLONE ACETATE 40 MG/ML IJ SUSP
80.0000 mg | INTRAMUSCULAR | Status: AC | PRN
  Administered 2022-03-10: 80 mg via INTRA_ARTICULAR

## 2022-03-10 MED ORDER — LIDOCAINE HCL 1 % IJ SOLN
2.0000 mL | INTRAMUSCULAR | Status: AC | PRN
  Administered 2022-03-10: 2 mL

## 2022-03-10 MED ORDER — BUPIVACAINE HCL 0.25 % IJ SOLN
2.0000 mL | INTRAMUSCULAR | Status: AC | PRN
  Administered 2022-03-10: 2 mL via INTRA_ARTICULAR

## 2022-03-10 NOTE — Progress Notes (Signed)
Office Visit Note   Patient: Molly Berry           Date of Birth: Sep 17, 1965           MRN: [id number] Visit Date: 03/10/2022              Requested by: Nicholes Rough, PA-C [place],  [place] [id number] PCP: Nicholes Rough, PA-C  Chief Complaint  Patient presents with  . Left Knee - Pain      HPI: Lynelle Smoke is a pleasant 56 year old woman who I have seen for multiple visits who has end-stage arthritis of both of her knees.  She has had cortisone injections before and is done fairly well.  Over the weekend she was going up a hill on some uneven ground.  When she came around and turned around to come down she fell on her side.  She denies any other injuries except focal pain on the lateral side of her knee.  She did not have any ecchymosis.  She has been treating this most symptomatically.  Assessment & Plan: Visit Diagnoses:  1. Unilateral primary osteoarthritis, left knee     Plan: X-rays are reassuring she does have advanced end-stage arthritis in her left knee.  We talked about all of this with her husband.  Her current BMI is 44.  To be eligible for knee replacement surgery she would need to lose about 28 pounds.  In the meantime we will go forward and give her a steroid injection today.  I will reevaluate her in a week.  She continue to use the walker until she feels stable.  Follow-Up Instructions: No follow-ups on file.   Ortho Exam  Patient is alert, oriented, no adenopathy, well-dressed, normal affect, normal respiratory effort. Patient is ambulating with a walker she is alert pleasant to exam left knee no warmth no effusion no erythema.  She is more tender to palpation over the lateral joint line and the medial joint line today.  No tenderness over the proximal tibia no tenderness over the patella.  No tenderness over the quadricep or patellar tendon no ecchymosis  Imaging: XR Knee 1-2 Views Left  Result Date: 03/10/2022 Three-view radiographs of the left knee  were obtained today.  She has overall well-maintained alignment she has advanced end-stage osteoarthritis of all 3 compartments but most notable in the patellofemoral compartment and medial compartment.  She has periarticular osteophytes no acute fractures are noted  No images are attached to the encounter.  Labs: Lab Results  Component Value Date   HGBA1C 6.4 (H) 02/07/2022   HGBA1C 6.6 (H) 07/12/2021   HGBA1C 6.8 (A) 03/06/2015   LABURIC 5.7 03/06/2015   REPTSTATUS 02/24/2022 FINAL 02/19/2022   CULT  02/19/2022    NO GROWTH 5 DAYS Performed at [facility], [address] [place],  [address]    LABORGA ESCHERICHIA COLI 02/06/2022     Lab Results  Component Value Date   ALBUMIN 3.2 (L) 02/20/2022   ALBUMIN 3.7 02/19/2022   ALBUMIN 3.9 02/06/2022    Lab Results  Component Value Date   MG 2.2 02/21/2022   MG 2.3 02/20/2022   MG 2.6 (H) 02/09/2022   Lab Results  Component Value Date   VD25OH 23.2 (L) 10/31/2014   VD25OH 20.6 (L) 06/21/2014    No results found for: "PREALBUMIN"    Latest Ref Rng & Units 02/21/2022    4:58 AM 02/20/2022    4:30 AM 02/19/2022    9:17  AM  CBC EXTENDED  WBC 4.0 - 10.5 K/uL 5.8  5.8  12.7   RBC 3.87 - 5.11 MIL/uL 3.90  3.86  4.37   Hemoglobin 12.0 - 15.0 g/dL 10.1  10.0  11.5   HCT 36.0 - 46.0 % 35.3  36.0  38.7   Platelets 150 - 400 K/uL 364  331  417   NEUT# 1.7 - 7.7 K/uL   10.9   Lymph# 0.7 - 4.0 K/uL   0.9      There is no height or weight on file to calculate BMI.  Orders:  Orders Placed This Encounter  Procedures  . XR Knee 1-2 Views Left   No orders of the defined types were placed in this encounter.    Procedures: Large Joint Inj: L knee on 03/10/2022 3:49 PM Indications: pain and diagnostic evaluation Details: 25 G 1.5 in needle, anterolateral approach  Arthrogram: No  Medications: 80 mg methylPREDNISolone acetate 40 MG/ML; 2 mL lidocaine 1 %; 2 mL bupivacaine 0.25 % Outcome: tolerated well, no  immediate complications Procedure, treatment alternatives, risks and benefits explained, specific risks discussed. Consent was given by the patient.    Clinical Data: No additional findings.  ROS:  All other systems negative, except as noted in the HPI. Review of Systems  Objective: Vital Signs: LMP 05/06/2011   Specialty Comments:  No specialty comments available.  PMFS History: Patient Active Problem List   Diagnosis Date Noted  . UTI (urinary tract infection) 02/19/2022  . Elevated d-dimer 02/07/2022  . Gram-negative bacteremia 02/07/2022  . Acute pyelonephritis 02/07/2022  . AKI (acute kidney injury) ([place]) 02/07/2022  . Gram negative sepsis ([place]) 02/07/2022  . Obesity, Class III, BMI 40-49.9 (morbid obesity) ([place]) 02/06/2022  . Sepsis due to undetermined organism ([place]) 02/06/2022  . Hypoxia 02/06/2022  . Opioid dependence ([place]) 02/06/2022  . Osteoarthritis of knees, bilateral 12/02/2021  . Pain in right wrist 11/07/2021  . Pain in left wrist 11/07/2021  . Complete tear of left rotator cuff 07/23/2021  . Bee sting allergy 07/07/2016  . Attention deficit disorder (ADD) in adult 07/07/2016  . Chronic insomnia 07/07/2016  . Vitamin D deficiency 07/07/2016  . Migraine syndrome 07/07/2016  . Osteoarthritis of both knees 07/07/2016  . Fibromyalgia syndrome 07/07/2016  . S/P gastric bypass 07/07/2016  . HLD (hyperlipidemia) 07/07/2016  . Morbid obesity due to excess calories ([place]) 03/10/2015  . Controlled type 2 diabetes mellitus without complication, without long-term current use of insulin ([place]) 03/09/2015  . Nontoxic multinodular goiter 03/09/2015  . Essential hypertension, benign 03/09/2015  . 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 IDA (iron deficiency anemia) 12/27/2013  . Ureteral calculus, left 11/17/2013  . SOB (shortness of breath) 11/30/2012   Past Medical History:  Diagnosis Date  . Allergy   . Anemia   . Anxiety   . Arthritis    knees, multiple joints  .  Asthma   . Bell palsy   . Blood transfusion without reported diagnosis   . Breast mass    lt breast mass x's 3 years increased in size  . Chronic kidney disease   . Chronic pain syndrome   . COPD (chronic obstructive pulmonary disease) ([place])   . Depression   . Diabetes mellitus   . Endometrial cancer ([place])   . Fibromyalgia   . GERD (gastroesophageal reflux disease)   . H/O gastric bypass 05/09/2014   At [facility]  . Headache    mirgraines  . Hyperlipidemia   . Hypertension   .  Hypothyroidism   . Multiple pulmonary nodules 11/30/2012   Followed in Pulmonary clinic/ [facility]/ [place]  - See CT abd  10/29/12  New right lower lobe pulmonary nodularity, primarily ground-  glass in density. This could reflect an inflammatory process,  although follow-up is necessary to exclude atypical neoplasm. Full  chest CT should be considered to evaluate for other pulmonary  findings.     . Multiple thyroid nodules   . Nephropathy   . Pneumonia   . PONV (postoperative nausea and vomiting)   . Pulmonary nodules   . Thyroid disease   . Ulcer   . Uterine cancer ([place]) 12/26/2011   Stage 1, grade 1, S/P hysterectomy initially by robotic technique and then [facility]ping[facility]oophorectomy at [facility] receiving no postoperative treatment.    Family History  Problem Relation Age of Onset  . Pneumonia Mother        Deceased  . Arthritis Mother   . Asthma Mother   . Cancer Mother        pancreatic  . COPD Mother   . Depression Mother   . Diabetes Mother   . Kidney disease Mother   . Liver cancer Father        Living  . Arthritis Father   . Cancer Father        liver, prostate  . COPD Father   . Depression Father   . Stroke Father   . Heart disease Maternal Grandmother   . Mental illness Maternal Grandmother   . Breast cancer Maternal Grandmother   . Alcohol abuse Paternal Grandfather   . Breast cancer Paternal Aunt   . Anesthesia problems Neg Hx   . Hypotension Neg Hx   . Malignant hyperthermia  Neg Hx   . Pseudochol deficiency Neg Hx   . Colon cancer Neg Hx     Past Surgical History:  Procedure Laterality Date  . ABDOMINAL HYSTERECTOMY     endometrial cancer  . CHOLECYSTECTOMY    . COLONOSCOPY WITH PROPOFOL N/A 01/19/2014   [id number]  . CYSTOSCOPY W/ URETERAL STENT PLACEMENT Bilateral 02/07/2022   Procedure: CYSTOSCOPY WITH RETROGRADE PYELOGRAM/RIGHT URETERAL STENT PLACEMENT WITH POSSIBLE LEFT;  Surgeon: Janith Lima, MD;  Location: [facility];  Service: Urology;  Laterality: Bilateral;  . DILATION AND CURETTAGE OF UTERUS  12 yrs ago  . DILATION AND CURETTAGE OF UTERUS  06/18/2011   Procedure: DILATATION AND CURETTAGE;  Surgeon: Florian Buff, MD;  Location: [facility];  Service: Gynecology;  Laterality: N/A;  Suction Dilation and Curettage  . ESOPHAGOGASTRODUODENOSCOPY N/A 10/12/2013   Dr. Rourk:anastomotic ulcer likely cause of bleeding. likely ischemic   . ESOPHAGOGASTRODUODENOSCOPY (EGD) WITH PROPOFOL N/A 01/19/2014   [id number]  . EXTRACORPOREAL SHOCK WAVE LITHOTRIPSY Left 01/11/2018   Procedure: LEFT EXTRACORPOREAL SHOCK WAVE LITHOTRIPSY (ESWL);  Surgeon: Lucas Mallow, MD;  Location: [facility];  Service: Urology;  Laterality: Left;  . EXTRACORPOREAL SHOCK WAVE LITHOTRIPSY Left 01/14/2018   Procedure: LEFT EXTRACORPOREAL SHOCK WAVE LITHOTRIPSY (ESWL);  Surgeon: Cleon Gustin, MD;  Location: [facility];  Service: Urology;  Laterality: Left;  75 MINS  W/ MAC  . GASTRIC BYPASS  2013   [facility]  . HYSTEROSCOPY WITH D & C  06/18/2011   Procedure: DILATATION AND CURETTAGE /HYSTEROSCOPY;  Surgeon: Florian Buff, MD;  Location: [facility];  Service: Gynecology;  Laterality: N/A;  . LITHOTRIPSY    . paniculectomy    . SHOULDER ARTHROSCOPY WITH SUBACROMIAL DECOMPRESSION AND OPEN ROTATOR C Left 07/16/2021  Procedure: LEFT ARTHROSCOPIC SUBACROMIAL DECOMPRESSION, MINI OPEN ROTATOR CUFF TEAR REPAIR, BICEPS TENOTOMY;  Surgeon: [name], MD;  Location: [facility];  Service:  Orthopedics;  Laterality: Left;  . TRIGGER FINGER RELEASE    . WISDOM TOOTH EXTRACTION     Social History   Occupational History  . Occupation: Event organiser employed)    Employer: NOT EMPLOYED  Tobacco Use  . Smoking status: Never  . Smokeless tobacco: Never  Vaping Use  . Vaping Use: Never used  Substance and Sexual Activity  . Alcohol use: No    Alcohol/week: 0.0 standard drinks of alcohol  . Drug use: No  . Sexual activity: Yes    Birth control/protection: None, Surgical

## 2022-03-11 ENCOUNTER — Other Ambulatory Visit: Payer: Self-pay | Admitting: Physician Assistant

## 2022-03-11 ENCOUNTER — Encounter (HOSPITAL_COMMUNITY): Payer: Self-pay | Admitting: Hematology and Oncology

## 2022-03-11 ENCOUNTER — Telehealth: Payer: Self-pay | Admitting: Physician Assistant

## 2022-03-11 MED ORDER — HYDROCODONE-ACETAMINOPHEN 5-325 MG PO TABS: 1.0000 | ORAL_TABLET | Freq: Four times a day (QID) | ORAL | 0 refills | Status: DC | PRN

## 2022-03-11 NOTE — Telephone Encounter (Signed)
Patients states Molly Berry was suppose to call in meds to [facility] in [place]. Patient is not sure what meds.  Please give patient a call to confirm [phone]

## 2022-03-12 ENCOUNTER — Encounter: Payer: Self-pay | Admitting: Urology

## 2022-03-12 ENCOUNTER — Ambulatory Visit (INDEPENDENT_AMBULATORY_CARE_PROVIDER_SITE_OTHER): Payer: 59 | Admitting: Urology

## 2022-03-12 VITALS — BP 111/58 | HR 61

## 2022-03-12 DIAGNOSIS — N200 Calculus of kidney: Secondary | ICD-10-CM

## 2022-03-12 DIAGNOSIS — Z87442 Personal history of urinary calculi: Secondary | ICD-10-CM

## 2022-03-12 NOTE — Patient Instructions (Signed)

## 2022-03-12 NOTE — Progress Notes (Signed)
03/12/2022 3:34 PM   Molly Berry Sep 04, 1965 [id number]  Referring provider: Nicholes Rough, PA-C [place],  [place] [id number]  Followup nephrolithiasis   HPI: Molly Berry is a 56yo here for followup for nephrolithiasis. She underwent bilateral ureteroscopy 1 week ago. She removed her stents POD#3 and has been doing well since removing her stents. No flank pain. No hematuria. No significant LUTS.    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 This could reflect an inflammatory process,  although follow-up is necessary to exclude atypical neoplasm. Full  chest CT should be considered to evaluate for other pulmonary  findings.      Multiple thyroid nodules    Nephropathy    Pneumonia    PONV (postoperative nausea and vomiting)    Pulmonary nodules    Thyroid disease    Ulcer    Uterine cancer ([place]) 12/26/2011   Stage 1, grade 1, S/P hysterectomy initially by robotic technique and then salpingo-oophorectomy at [facility] receiving no postoperative treatment.    Surgical History: Past Surgical History:  Procedure Laterality Date   ABDOMINAL HYSTERECTOMY     endometrial cancer   CHOLECYSTECTOMY     COLONOSCOPY  WITH PROPOFOL N/A 01/19/2014   [id number]   CYSTOSCOPY W/ URETERAL STENT PLACEMENT Bilateral 02/07/2022   Procedure: CYSTOSCOPY WITH RETROGRADE PYELOGRAM/RIGHT URETERAL STENT PLACEMENT WITH POSSIBLE LEFT;  Surgeon: Janith Lima, MD;  Location: [facility];  Service: Urology;  Laterality: Bilateral;   DILATION AND CURETTAGE OF UTERUS  12 yrs ago   DILATION AND CURETTAGE OF UTERUS  06/18/2011   Procedure: DILATATION AND CURETTAGE;  Surgeon: Florian Buff, MD;  Location: [facility];  Service: Gynecology;  Laterality: N/A;  Suction Dilation and Curettage   ESOPHAGOGASTRODUODENOSCOPY N/A 10/12/2013   Dr. Rourk:anastomotic ulcer likely cause of bleeding. likely ischemic    ESOPHAGOGASTRODUODENOSCOPY (EGD) WITH PROPOFOL N/A 01/19/2014   HOZ:[id number]   EXTRACORPOREAL SHOCK WAVE LITHOTRIPSY Left 01/11/2018   Procedure: LEFT EXTRACORPOREAL SHOCK WAVE LITHOTRIPSY (ESWL);  Surgeon: Lucas Mallow, MD;  Location: [facility];  Service: Urology;  Laterality: Left;   EXTRACORPOREAL SHOCK WAVE LITHOTRIPSY Left 01/14/2018   Procedure: LEFT EXTRACORPOREAL SHOCK WAVE LITHOTRIPSY (ESWL);  Surgeon: Cleon Gustin, MD;  Location: [facility];  Service: Urology;  Laterality: Left;  38 MINS  W/ MAC   GASTRIC BYPASS  2013   [facility]   HYSTEROSCOPY WITH D & C  06/18/2011   Procedure: DILATATION AND CURETTAGE /HYSTEROSCOPY;  Surgeon: Florian Buff, MD;  Location: [facility];  Service: Gynecology;  Laterality: N/A;   LITHOTRIPSY     paniculectomy     SHOULDER ARTHROSCOPY WITH SUBACROMIAL DECOMPRESSION AND OPEN ROTATOR C Left 07/16/2021   Procedure: LEFT ARTHROSCOPIC SUBACROMIAL DECOMPRESSION, MINI OPEN ROTATOR CUFF TEAR REPAIR, BICEPS TENOTOMY;  Surgeon: [name], MD;  Location: [facility];  Service: Orthopedics;  Laterality: Left;   TRIGGER FINGER RELEASE     WISDOM TOOTH EXTRACTION      Home Medications:  Allergies as of 03/12/2022       Reactions   Bee Venom Shortness Of Breath, Swelling, Rash   Cephalexin  Anaphylaxis   Cephalexin Anaphylaxis, Itching, Swelling   Ciprofloxacin Hives, Rash, Swelling   hives hives hives   Penicillins Hives, Rash, Itching, Swelling   Has patient had a PCN reaction causing immediate rash, facial/tongue/throat swelling, SOB or lightheadedness with hypotension: Unknown Has patient had a PCN reaction causing severe rash involving mucus membranes or skin necrosis: Unknown Has patient had a PCN reaction that required hospitalization: Unknown Has patient had a PCN reaction occurring within the last 10 years: No If all of the above answers are "NO", then may proceed with Cephalosporin use. Has patient had a PCN reaction causing immediate rash, facial/tongue/throat swelling, SOB or lightheadedness with hypotension: Unknown Has patient had a PCN reaction causing severe rash involving mucus membranes or skin necrosis: Unknown Has patient had a PCN reaction that required hospitalization: Unknown Has patient had a PCN reaction occurring within the last 10 years: No If all of the above answers are "NO", then may proceed with Cephalosporin use.   Vancomycin Anaphylaxis, Hives, Itching   Has taken for bronchitis under medical supervision on site with benadryl Other reaction(s): Other (See Comments) Red man syndrome, gave benadryl to help, per RN.  Slow down infusion with future dose. Has taken for bronchitis under medical supervision on site with benadryl   Carbamazepine    Loss of balance, trembling, inability to think, resulting in ER visit shortly after start of course  Other reaction(s): Other (See Comments) Speech, mind slows down, cant walk Loss of balance, trembling, inability to think, resulting in ER visit shortly after start of course   Cefotaxime Rash   Other reaction(s): Other (See Comments)   Sulfacetamide Sodium Rash   Doxycycline Diarrhea, Nausea And Vomiting   Adhesive [tape] Rash   Clindamycin Rash   Latex Rash   Sulfa Antibiotics Rash        Medication  List        Accurate as of March 12, 2022  3:34 PM. If you have any questions, ask your nurse or doctor.          albuterol 108 (90 Base) MCG/ACT inhaler Commonly known as: VENTOLIN HFA Inhale 2 puffs into the lungs every 6 (six) hours as needed for shortness of breath or wheezing.   ALPRAZolam 1 MG tablet Commonly known as: XANAX Take 1 mg by mouth 3 (three) times daily as needed for anxiety or sleep.   buPROPion 150 MG 12 hr tablet Commonly known as: WELLBUTRIN SR Take 150 mg by mouth 2 (two) times daily.   cyanocobalamin 1000 MCG tablet Take 1 tablet (1,000 mcg total) by mouth daily.   dicyclomine 20 MG tablet Commonly known as: BENTYL Take 20 mg by mouth 3 (three) times daily as needed for spasms.   diphenhydrAMINE 25 mg capsule Commonly known as: BENADRYL Take 25 mg by mouth at bedtime.   docusate sodium 100 MG capsule Commonly known as: COLACE Take  1 capsule (100 mg total) by mouth daily.   esomeprazole 40 MG capsule Commonly known as: NEXIUM Take 40 mg by mouth 2 (two) times daily before a meal.   famotidine 40 MG tablet Commonly known as: PEPCID Take 40 mg by mouth at bedtime.   ferrous sulfate 325 (65 FE) MG tablet Take 1 tablet (325 mg total) by mouth daily with breakfast.   fosfomycin 3 g Pack Commonly known as: MONUROL Take 3 g by mouth.   furosemide 20 MG tablet Commonly known as: LASIX Take 20 mg by mouth daily.   gabapentin 600 MG tablet Commonly known as: NEURONTIN Take 1,200 mg by mouth 2 (two) times daily.   HYDROcodone-acetaminophen 5-325 MG tablet Commonly known as: NORCO/VICODIN Take 1 tablet by mouth every 6 (six) hours as needed for moderate pain.   HYDROmorphone 2 MG tablet Commonly known as: Dilaudid Take 1 tablet (2 mg total) by mouth every 4 (four) hours as needed for severe pain.   lamoTRIgine 200 MG tablet Commonly known as: LAMICTAL Take 200 mg by mouth 2 (two) times daily.   magic mouthwash (lidocaine,  diphenhydrAMINE, alum & mag hydroxide) suspension Swish and spit 5 mLs 3 (three) times daily as needed for mouth pain.   methocarbamol 750 MG tablet Commonly known as: ROBAXIN Take 750 mg by mouth 3 (three) times daily as needed for muscle spasms.   metoprolol succinate 50 MG 24 hr tablet Commonly known as: TOPROL-XL Take 50 mg by mouth daily.   mirabegron ER 25 MG Tb24 tablet Commonly known as: MYRBETRIQ Take 1 tablet (25 mg total) by mouth daily.   ondansetron 8 MG tablet Commonly known as: ZOFRAN Take 1 tablet (8 mg total) by mouth every 8 (eight) hours as needed for nausea or vomiting.   oxyCODONE-acetaminophen 7.5-325 MG tablet Commonly known as: Percocet Take 1 tablet by mouth every 4 (four) hours as needed for severe pain.   phenazopyridine 100 MG tablet Commonly known as: Pyridium Take 1 tablet (100 mg total) by mouth 3 (three) times daily as needed for pain.   simvastatin 20 MG tablet Commonly known as: ZOCOR Take 20 mg by mouth daily.   sucralfate 1 g tablet Commonly known as: CARAFATE Take 1 g by mouth 4 (four) times daily.   SUMAtriptan 100 MG tablet Commonly known as: IMITREX Take 100 mg by mouth every 2 (two) hours as needed for migraine.   tiZANidine 4 MG tablet Commonly known as: ZANAFLEX Take 4 mg by mouth at bedtime.   Vitamin D (Ergocalciferol) 1.25 MG (50000 UNIT) Caps capsule Commonly known as: DRISDOL Take 50,000 Units by mouth once a week.   vortioxetine HBr 20 MG Tabs tablet Commonly known as: TRINTELLIX Take 20 mg by mouth at bedtime.        Allergies:  Allergies  Allergen Reactions   Bee Venom Shortness Of Breath, Swelling and Rash   Cephalexin Anaphylaxis   Cephalexin Anaphylaxis, Itching and Swelling   Ciprofloxacin Hives, Rash and Swelling    hives hives  hives   Penicillins Hives, Rash, Itching and Swelling    Has patient had a PCN reaction causing immediate rash, facial/tongue/throat swelling, SOB or lightheadedness with  hypotension: Unknown Has patient had a PCN reaction causing severe rash involving mucus membranes or skin necrosis: Unknown Has patient had a PCN reaction that required hospitalization: Unknown Has patient had a PCN reaction occurring within the last 10 years: No If all of the above answers are "NO", then may proceed with  Cephalosporin use. Has patient had a PCN reaction causing immediate rash, facial/tongue/throat swelling, SOB or lightheadedness with hypotension: Unknown Has patient had a PCN reaction causing severe rash involving mucus membranes or skin necrosis: Unknown Has patient had a PCN reaction that required hospitalization: Unknown Has patient had a PCN reaction occurring within the last 10 years: No If all of the above answers are "NO", then may proceed with Cephalosporin use.   Vancomycin Anaphylaxis, Hives and Itching    Has taken for bronchitis under medical supervision on site with benadryl Other reaction(s): Other (See Comments) Red man syndrome, gave benadryl to help, per RN.  Slow down infusion with future dose.  Has taken for bronchitis under medical supervision on site with benadryl   Carbamazepine     Loss of balance, trembling, inability to think, resulting in ER visit shortly after start of course  Other reaction(s): Other (See Comments) Speech, mind slows down, cant walk  Loss of balance, trembling, inability to think, resulting in ER visit shortly after start of course   Cefotaxime Rash    Other reaction(s): Other (See Comments)   Sulfacetamide Sodium Rash   Doxycycline Diarrhea and Nausea And Vomiting   Adhesive [Tape] Rash   Clindamycin Rash   Latex Rash   Sulfa Antibiotics Rash    Family History: Family History  Problem Relation Age of Onset   Pneumonia Mother        Deceased   Arthritis Mother    Asthma Mother    Cancer Mother        pancreatic   COPD Mother    Depression Mother    Diabetes Mother    Kidney disease Mother    Liver cancer Father         Living   Arthritis Father    Cancer Father        liver, prostate   COPD Father    Depression Father    Stroke Father    Heart disease Maternal Grandmother    Mental illness Maternal Grandmother    Breast cancer Maternal Grandmother    Alcohol abuse Paternal Grandfather    Breast cancer Paternal Aunt    Anesthesia problems Neg Hx    Hypotension Neg Hx    Malignant hyperthermia Neg Hx    Pseudochol deficiency Neg Hx    Colon cancer Neg Hx     Social History:  reports that she has never smoked. She has never used smokeless tobacco. She reports that she does not drink alcohol and does not use drugs.  ROS: All other review of systems were reviewed and are negative except what is noted above in HPI  Physical Exam: BP (!) 111/58   Pulse 61   LMP 05/06/2011   Constitutional:  Alert and oriented, No acute distress. HEENT: [place] AT, moist mucus membranes.  Trachea midline, no masses. Cardiovascular: No clubbing, cyanosis, or edema. Respiratory: Normal respiratory effort, no increased work of breathing. GI: Abdomen is soft, nontender, nondistended, no abdominal masses GU: No CVA tenderness.  Lymph: No cervical or inguinal lymphadenopathy. Skin: No rashes, bruises or suspicious lesions. Neurologic: Grossly intact, no focal deficits, moving all 4 extremities. Psychiatric: Normal mood and affect.  Laboratory Data: Lab Results  Component Value Date   WBC 5.8 02/21/2022   HGB 10.1 (L) 02/21/2022   HCT 35.3 (L) 02/21/2022   MCV 90.5 02/21/2022   PLT 364 02/21/2022    Lab Results  Component Value Date   CREATININE 0.87 02/21/2022  No results found for: "PSA"  No results found for: "TESTOSTERONE"  Lab Results  Component Value Date   HGBA1C 6.4 (H) 02/07/2022    Urinalysis    Component Value Date/Time   COLORURINE AMBER (A) 02/19/2022 0839   APPEARANCEUR Cloudy (A) 02/25/2022 0921   LABSPEC 1.020 02/19/2022 0839   PHURINE 5.0 02/19/2022 0839   GLUCOSEU  CANCELED 02/25/2022 0921   HGBUR MODERATE (A) 02/19/2022 0839   BILIRUBINUR NEGATIVE 02/19/2022 0839   BILIRUBINUR negative 08/06/2021 0849   KETONESUR NEGATIVE 02/19/2022 0839   PROTEINUR CANCELED 02/25/2022 0921   PROTEINUR 100 (A) 02/19/2022 0839   UROBILINOGEN 1.0 08/06/2021 0849   UROBILINOGEN 1.0 01/16/2015 0149   NITRITE POSITIVE (A) 02/19/2022 0839   LEUKOCYTESUR NEGATIVE 02/19/2022 0839    Lab Results  Component Value Date   LABMICR See below: 02/25/2022   WBCUA 11-30 (A) 02/25/2022   LABEPIT 0-10 02/25/2022   BACTERIA Many (A) 02/25/2022    Pertinent Imaging:  Results for orders placed during the hospital encounter of 01/14/18  DG Abd 1 View  Narrative CLINICAL DATA:  Patient for lithotripsy today.  EXAM: ABDOMEN - 1 VIEW  COMPARISON:  CT abdomen and pelvis 12/30/2017.  KUB 01/11/2018.  FINDINGS: Although visualization is somewhat difficult due to gas and stool, stone within the left renal pelvis is unchanged. 2 small stones in the upper pole of the right kidney are also unchanged. No evidence of ureteral stone is identified. Bowel gas pattern is nonobstructive.  IMPRESSION: Stone in the left renal pelvis and 2 stones in the upper pole of the right kidney are unchanged. Visualization is limited due to extensive gas and stool.   Electronically Signed By: Inge Rise M.D. On: 01/14/2018 13:47  No results found for this or any previous visit.  No results found for this or any previous visit.  No results found for this or any previous visit.  Results for orders placed during the hospital encounter of 02/15/15  US Renal  Narrative CLINICAL DATA:  Episode of acute cystitis 1 month ago, history of kidney stones and lithotripsy for same.  EXAM: RENAL / URINARY TRACT ULTRASOUND COMPLETE  COMPARISON:  Abdominal and pelvic CT scan of November 18, 2013  FINDINGS: Right Kidney:  Length: 12.3 cm. The renal cortical echotexture is normal. There  is no hydronephrosis. No masses are observed. No definite stones are appreciated  Left Kidney:  Length: 12.7 cm. The renal cortical echotexture is normal. There is no cystic or solid mass. There is no hydronephrosis. No definite stones are appreciated.  Bladder:  The bladder was difficult to visualize due to the patient's body habitus. Ureteral jets could not be confirmed.  IMPRESSION: 1. No acute or significant chronic abnormality of the kidneys is observed. Stones were demonstrated bilaterally on the July 2015 CT scan but are not definitely appreciated today. 2. Evaluation of the bladder was limited.   Electronically Signed By: David  Martinique M.D. On: 02/15/2015 15:57  No valid procedures specified. No results found for this or any previous visit.  Results for orders placed during the hospital encounter of 02/06/22  CT RENAL STONE STUDY  Narrative CLINICAL DATA:  Right flank pain.  EXAM: CT ABDOMEN AND PELVIS WITHOUT CONTRAST  TECHNIQUE: Multidetector CT imaging of the abdomen and pelvis was performed following the standard protocol without IV contrast.  RADIATION DOSE REDUCTION: This exam was performed according to the departmental dose-optimization program which includes automated exposure control, adjustment of the mA and/or kV according to  patient size and/or use of iterative reconstruction technique.  COMPARISON:  02/04/2022  FINDINGS: Lower chest: Bibasilar scarring and atelectasis. No pleural effusions. The heart is normal in size. No pericardial effusion.  Hepatobiliary: No hepatic lesions or intrahepatic biliary dilatation. The gallbladder is surgically absent. No common bile duct dilatation.  Pancreas: No mass, inflammation ductal dilatation.  Spleen: Normal size.  No focal lesions.  Adrenals/Urinary Tract: The adrenal glands are normal.  New moderate right-sided hydronephrosis and hydroureter. There are 2 ureteral calculi. The more proximal  calculus is at the L5 level and measures a maximum of or 6 mm. The more distal calculus is located at the mid sacral level and also measures 6 mm.  Bilateral renal calculi. There is a 6 mm calculus in the left extra renal pelvis but no obstructing left ureteral calculi. No bladder calculi. No worrisome renal or bladder lesions are identified without contrast.  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 The duodenum, small bowel and colon unremarkable.  Vascular/Lymphatic: Stable scattered atherosclerotic calcifications involving the aorta and iliac arteries but no aneurysm. No mesenteric or retroperitoneal lymphadenopathy. A retroaortic left renal vein is noted incidentally.  Reproductive: Surgically absent.  Other: Small amount of perinephric fluid on the right side also tracking down along the right ureter and into the pelvis.  Musculoskeletal: No significant bony findings.  IMPRESSION: 1. Two new 6 mm right ureteral calculi causing moderate right-sided hydronephrosis and hydroureter. 2. Bilateral renal calculi. 3. 6 mm calculus in the left extra renal pelvis but no obstructing left ureteral calculi. 4. 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 5. Status post cholecystectomy. No biliary dilatation. 6. Aortic atherosclerosis.  Aortic Atherosclerosis (ICD10-I70.0).   Electronically Signed By: Marijo Sanes M.D. On: 02/07/2022 10:13   Assessment & Plan:    1. Renal calculus -RTC 4-6 weeks with a renal US - Urinalysis, Routine w reflex microscopic   No follow-ups on file.  Nicolette Bang, MD  [facility] [place]

## 2022-03-13 LAB — MICROSCOPIC EXAMINATION: Bacteria, UA: NONE SEEN

## 2022-03-13 LAB — URINALYSIS, ROUTINE W REFLEX MICROSCOPIC
Bilirubin, UA: NEGATIVE
Glucose, UA: NEGATIVE
Ketones, UA: NEGATIVE
Leukocytes,UA: NEGATIVE
Nitrite, UA: NEGATIVE
Protein,UA: NEGATIVE
Specific Gravity, UA: 1.01 (ref 1.005–1.030)
Urobilinogen, Ur: 0.2 mg/dL (ref 0.2–1.0)
pH, UA: 5 (ref 5.0–7.5)

## 2022-03-14 LAB — CALCULI, WITH PHOTOGRAPH (CLINICAL LAB)
Calcium Oxalate Monohydrate: 100 %
Weight Calculi: 202 mg

## 2022-03-17 ENCOUNTER — Encounter (HOSPITAL_BASED_OUTPATIENT_CLINIC_OR_DEPARTMENT_OTHER): Payer: Self-pay

## 2022-03-17 ENCOUNTER — Encounter (HOSPITAL_COMMUNITY): Payer: Self-pay | Admitting: Hematology and Oncology

## 2022-03-17 ENCOUNTER — Ambulatory Visit (HOSPITAL_BASED_OUTPATIENT_CLINIC_OR_DEPARTMENT_OTHER): Admit: 2022-03-17 | Payer: 59 | Admitting: Urology

## 2022-03-17 SURGERY — CYSTOSCOPY/URETEROSCOPY/HOLMIUM LASER/STENT PLACEMENT
Anesthesia: General | Laterality: Bilateral

## 2022-03-20 ENCOUNTER — Ambulatory Visit: Payer: 59 | Admitting: Physician Assistant

## 2022-03-25 ENCOUNTER — Ambulatory Visit: Payer: 59 | Admitting: Physician Assistant

## 2022-03-25 ENCOUNTER — Encounter (HOSPITAL_COMMUNITY): Payer: Self-pay | Admitting: Hematology and Oncology

## 2022-04-08 ENCOUNTER — Encounter (HOSPITAL_COMMUNITY): Payer: Self-pay | Admitting: Hematology and Oncology

## 2022-04-11 ENCOUNTER — Ambulatory Visit (HOSPITAL_COMMUNITY)
Admission: RE | Admit: 2022-04-11 | Discharge: 2022-04-11 | Disposition: A | Discharge status: Home / Self Care | Payer: 59 | Source: Ambulatory Visit | Attending: Urology | Admitting: Urology

## 2022-04-11 DIAGNOSIS — N200 Calculus of kidney: Secondary | ICD-10-CM | POA: Diagnosis present

## 2022-04-22 ENCOUNTER — Ambulatory Visit: Payer: Self-pay | Admitting: Urology

## 2022-04-22 DIAGNOSIS — N200 Calculus of kidney: Secondary | ICD-10-CM

## 2022-05-16 ENCOUNTER — Ambulatory Visit: Payer: Self-pay | Admitting: Urology

## 2022-06-27 ENCOUNTER — Ambulatory Visit (INDEPENDENT_AMBULATORY_CARE_PROVIDER_SITE_OTHER): Payer: 59 | Admitting: Urology

## 2022-06-27 ENCOUNTER — Encounter: Payer: Self-pay | Admitting: Urology

## 2022-06-27 ENCOUNTER — Encounter (HOSPITAL_COMMUNITY): Payer: Self-pay | Admitting: Hematology and Oncology

## 2022-06-27 VITALS — BP 113/74 | HR 65

## 2022-06-27 DIAGNOSIS — Z87442 Personal history of urinary calculi: Secondary | ICD-10-CM

## 2022-06-27 DIAGNOSIS — N200 Calculus of kidney: Secondary | ICD-10-CM

## 2022-06-27 LAB — URINALYSIS, ROUTINE W REFLEX MICROSCOPIC
Bilirubin, UA: NEGATIVE
Glucose, UA: NEGATIVE
Ketones, UA: NEGATIVE
Leukocytes,UA: NEGATIVE
Nitrite, UA: NEGATIVE
Protein,UA: NEGATIVE
RBC, UA: NEGATIVE
Specific Gravity, UA: 1.02 (ref 1.005–1.030)
Urobilinogen, Ur: 0.2 mg/dL (ref 0.2–1.0)
pH, UA: 5.5 (ref 5.0–7.5)

## 2022-06-27 MED ORDER — INDAPAMIDE 2.5 MG PO TABS: 2.5000 mg | ORAL_TABLET | Freq: Every day | ORAL | 11 refills | Status: DC

## 2022-06-27 NOTE — Progress Notes (Signed)
06/27/2022 1:10 PM   Molly Berry 12/24/1965 [id number]  Referring provider: Nicholes Rough, PA-C [place],  [place] [address]  Followup nephrolithiasis  HPI: Molly Berry is  a 57yo here for followuup for nephrolithiasis. No stone events since last visit. She has mild intermittent right flank pain. No UTI since last visit. Renal US 04/11/2022 showed no calculi. She drinks 48-64oz of water daily.    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 This could reflect an inflammatory process,  although follow-up is necessary to exclude atypical neoplasm. Full  chest CT should be considered to evaluate for other pulmonary  findings.      Multiple thyroid nodules    Nephropathy    Pneumonia    PONV (postoperative nausea and vomiting)    Pulmonary nodules    Thyroid disease    Ulcer    Uterine cancer ([place]) 12/26/2011   Stage 1, grade 1, S/P hysterectomy initially by robotic technique and then salpingo-oophorectomy at [facility] receiving no postoperative treatment.    Surgical History: Past Surgical History:  Procedure Laterality Date   ABDOMINAL HYSTERECTOMY     endometrial cancer   CHOLECYSTECTOMY     COLONOSCOPY WITH  PROPOFOL N/A 01/19/2014   [id number]   CYSTOSCOPY W/ URETERAL STENT PLACEMENT Bilateral 02/07/2022   Procedure: CYSTOSCOPY WITH RETROGRADE PYELOGRAM/RIGHT URETERAL STENT PLACEMENT WITH POSSIBLE LEFT;  Surgeon: Janith Lima, MD;  Location: [facility];  Service: Urology;  Laterality: Bilateral;   CYSTOSCOPY WITH RETROGRADE PYELOGRAM, URETEROSCOPY AND STENT PLACEMENT Bilateral 03/06/2022   Procedure: CYSTOSCOPY WITH RETROGRADE PYELOGRAM, URETEROSCOPY AND STENT EXCHANGE;  Surgeon: Cleon Gustin, MD;  Location: [facility];  Service: Urology;  Laterality: Bilateral;   DILATION AND CURETTAGE OF UTERUS  12 yrs ago   DILATION AND CURETTAGE OF UTERUS  06/18/2011   Procedure: DILATATION AND CURETTAGE;  Surgeon: Florian Buff, MD;  Location: [facility];  Service: Gynecology;  Laterality: N/A;  Suction Dilation and Curettage   ESOPHAGOGASTRODUODENOSCOPY N/A 10/12/2013   Dr. Rourk:anastomotic ulcer likely cause of bleeding. likely ischemic    ESOPHAGOGASTRODUODENOSCOPY (EGD) WITH PROPOFOL N/A 01/19/2014   [id number]   EXTRACORPOREAL SHOCK WAVE LITHOTRIPSY Left 01/11/2018   Procedure: LEFT EXTRACORPOREAL SHOCK WAVE LITHOTRIPSY (ESWL);  Surgeon: Lucas Mallow, MD;  Location: [facility];  Service: Urology;  Laterality: Left;   EXTRACORPOREAL SHOCK WAVE LITHOTRIPSY Left 01/14/2018   Procedure: LEFT EXTRACORPOREAL SHOCK WAVE LITHOTRIPSY (ESWL);  Surgeon: Cleon Gustin, MD;  Location: [facility];  Service: Urology;  Laterality: Left;  75  MINS  W/ MAC   GASTRIC BYPASS  2013   [facility]   HOLMIUM LASER APPLICATION Bilateral [id number]   Procedure: HOLMIUM LASER APPLICATION;  Surgeon: Cleon Gustin, MD;  Location: [facility];  Service: Urology;  Laterality: Bilateral;   HYSTEROSCOPY WITH D & C  06/18/2011   Procedure: DILATATION AND CURETTAGE /HYSTEROSCOPY;  Surgeon: Florian Buff, MD;  Location: [facility];  Service: Gynecology;  Laterality: N/A;   LITHOTRIPSY     paniculectomy     SHOULDER ARTHROSCOPY WITH  SUBACROMIAL DECOMPRESSION AND OPEN ROTATOR C Left 07/16/2021   Procedure: LEFT ARTHROSCOPIC SUBACROMIAL DECOMPRESSION, MINI OPEN ROTATOR CUFF TEAR REPAIR, BICEPS TENOTOMY;  Surgeon: [name], MD;  Location: [facility];  Service: Orthopedics;  Laterality: Left;   STONE EXTRACTION WITH BASKET Bilateral 03/06/2022   Procedure: STONE EXTRACTION WITH BASKET;  Surgeon: Cleon Gustin, MD;  Location: [facility];  Service: Urology;  Laterality: Bilateral;   TRIGGER FINGER RELEASE     WISDOM TOOTH EXTRACTION      Home Medications:  Allergies as of 06/27/2022       Reactions   Bee Venom Shortness Of Breath, Swelling, Rash   Cephalexin Anaphylaxis   Cephalexin Anaphylaxis, Itching, Swelling   Ciprofloxacin Hives, Rash, Swelling   hives hives hives   Penicillins Hives, Rash, Itching, Swelling   Has patient had a PCN reaction causing immediate rash, facial/tongue/throat swelling, SOB or lightheadedness with hypotension: Unknown Has patient had a PCN reaction causing severe rash involving mucus membranes or skin necrosis: Unknown Has patient had a PCN reaction that required hospitalization: Unknown Has patient had a PCN reaction occurring within the last 10 years: No If all of the above answers are "NO", then may proceed with Cephalosporin use. Has patient had a PCN reaction causing immediate rash, facial/tongue/throat swelling, SOB or lightheadedness with hypotension: Unknown Has patient had a PCN reaction causing severe rash involving mucus membranes or skin necrosis: Unknown Has patient had a PCN reaction that required hospitalization: Unknown Has patient had a PCN reaction occurring within the last 10 years: No If all of the above answers are "NO", then may proceed with Cephalosporin use.   Vancomycin Anaphylaxis, Hives, Itching   Has taken for bronchitis under medical supervision on site with benadryl Other reaction(s): Other (See Comments) Red man syndrome, gave benadryl to help, per RN.   Slow down infusion with future dose. Has taken for bronchitis under medical supervision on site with benadryl   Carbamazepine    Loss of balance, trembling, inability to think, resulting in ER visit shortly after start of course  Other reaction(s): Other (See Comments) Speech, mind slows down, cant walk Loss of balance, trembling, inability to think, resulting in ER visit shortly after start of course   Cefotaxime Rash   Other reaction(s): Other (See Comments)   Sulfacetamide Sodium Rash   Doxycycline Diarrhea, Nausea And Vomiting   Adhesive [tape] Rash   Clindamycin Rash   Latex Rash   Sulfa Antibiotics Rash        Medication List        Accurate as of June 27, 2022  1:10 PM. If you have any questions, ask your nurse or doctor.          albuterol 108 (90 Base) MCG/ACT inhaler Commonly known as: VENTOLIN HFA Inhale 2 puffs into the lungs every 6 (six) hours as needed for shortness of breath or wheezing.   ALPRAZolam 1 MG tablet Commonly known as: XANAX Take 1 mg by  mouth 3 (three) times daily as needed for anxiety or sleep.   buPROPion 150 MG 12 hr tablet Commonly known as: WELLBUTRIN SR Take 150 mg by mouth 2 (two) times daily.   dicyclomine 20 MG tablet Commonly known as: BENTYL Take 20 mg by mouth 3 (three) times daily as needed for spasms.   diphenhydrAMINE 25 mg capsule Commonly known as: BENADRYL Take 25 mg by mouth at bedtime.   docusate sodium 100 MG capsule Commonly known as: COLACE Take 1 capsule (100 mg total) by mouth daily.   Dulaglutide 0.75 MG/0.5ML Sopn Inject into the skin.   esomeprazole 40 MG capsule Commonly known as: NEXIUM Take 40 mg by mouth 2 (two) times daily before a meal.   famotidine 40 MG tablet Commonly known as: PEPCID Take 40 mg by mouth at bedtime.   ferrous sulfate 325 (65 FE) MG tablet Take 1 tablet (325 mg total) by mouth daily with breakfast.   fosfomycin 3 g Pack Commonly known as: MONUROL Take 3 g by  mouth.   furosemide 20 MG tablet Commonly known as: LASIX Take 20 mg by mouth daily.   gabapentin 600 MG tablet Commonly known as: NEURONTIN Take 1,200 mg by mouth 2 (two) times daily.   HYDROcodone-acetaminophen 5-325 MG tablet Commonly known as: NORCO/VICODIN Take 1 tablet by mouth every 6 (six) hours as needed for moderate pain.   HYDROmorphone 2 MG tablet Commonly known as: Dilaudid Take 1 tablet (2 mg total) by mouth every 4 (four) hours as needed for severe pain.   Januvia 100 MG tablet Generic drug: sitaGLIPtin Take 100 mg by mouth daily.   lamoTRIgine 200 MG tablet Commonly known as: LAMICTAL Take 200 mg by mouth 2 (two) times daily.   magic mouthwash (lidocaine, diphenhydrAMINE, alum & mag hydroxide) suspension Swish and spit 5 mLs 3 (three) times daily as needed for mouth pain.   methocarbamol 750 MG tablet Commonly known as: ROBAXIN Take 750 mg by mouth 3 (three) times daily as needed for muscle spasms.   metoprolol succinate 50 MG 24 hr tablet Commonly known as: TOPROL-XL Take 50 mg by mouth daily.   mirabegron ER 25 MG Tb24 tablet Commonly known as: MYRBETRIQ Take 1 tablet (25 mg total) by mouth daily.   ondansetron 8 MG tablet Commonly known as: ZOFRAN Take 1 tablet (8 mg total) by mouth every 8 (eight) hours as needed for nausea or vomiting.   oxyCODONE-acetaminophen 7.5-325 MG tablet Commonly known as: Percocet Take 1 tablet by mouth every 4 (four) hours as needed for severe pain.   phenazopyridine 100 MG tablet Commonly known as: Pyridium Take 1 tablet (100 mg total) by mouth 3 (three) times daily as needed for pain.   simvastatin 20 MG tablet Commonly known as: ZOCOR Take 20 mg by mouth daily.   sucralfate 1 g tablet Commonly known as: CARAFATE Take 1 g by mouth 4 (four) times daily.   SUMAtriptan 100 MG tablet Commonly known as: IMITREX Take 100 mg by mouth every 2 (two) hours as needed for migraine.   tiZANidine 4 MG  tablet Commonly known as: ZANAFLEX Take 4 mg by mouth at bedtime.   Vitamin D (Ergocalciferol) 1.25 MG (50000 UNIT) Caps capsule Commonly known as: DRISDOL Take 50,000 Units by mouth once a week.   vortioxetine HBr 20 MG Tabs tablet Commonly known as: TRINTELLIX Take 20 mg by mouth at bedtime.        Allergies:  Allergies  Allergen Reactions   Bee Venom  Shortness Of Breath, Swelling and Rash   Cephalexin Anaphylaxis   Cephalexin Anaphylaxis, Itching and Swelling   Ciprofloxacin Hives, Rash and Swelling    hives hives  hives   Penicillins Hives, Rash, Itching and Swelling    Has patient had a PCN reaction causing immediate rash, facial/tongue/throat swelling, SOB or lightheadedness with hypotension: Unknown Has patient had a PCN reaction causing severe rash involving mucus membranes or skin necrosis: Unknown Has patient had a PCN reaction that required hospitalization: Unknown Has patient had a PCN reaction occurring within the last 10 years: No If all of the above answers are "NO", then may proceed with Cephalosporin use. Has patient had a PCN reaction causing immediate rash, facial/tongue/throat swelling, SOB or lightheadedness with hypotension: Unknown Has patient had a PCN reaction causing severe rash involving mucus membranes or skin necrosis: Unknown Has patient had a PCN reaction that required hospitalization: Unknown Has patient had a PCN reaction occurring within the last 10 years: No If all of the above answers are "NO", then may proceed with Cephalosporin use.   Vancomycin Anaphylaxis, Hives and Itching    Has taken for bronchitis under medical supervision on site with benadryl Other reaction(s): Other (See Comments) Red man syndrome, gave benadryl to help, per RN.  Slow down infusion with future dose.  Has taken for bronchitis under medical supervision on site with benadryl   Carbamazepine     Loss of balance, trembling, inability to think, resulting in ER visit  shortly after start of course  Other reaction(s): Other (See Comments) Speech, mind slows down, cant walk  Loss of balance, trembling, inability to think, resulting in ER visit shortly after start of course   Cefotaxime Rash    Other reaction(s): Other (See Comments)   Sulfacetamide Sodium Rash   Doxycycline Diarrhea and Nausea And Vomiting   Adhesive [Tape] Rash   Clindamycin Rash   Latex Rash   Sulfa Antibiotics Rash    Family History: Family History  Problem Relation Age of Onset   Pneumonia Mother        Deceased   Arthritis Mother    Asthma Mother    Cancer Mother        pancreatic   COPD Mother    Depression Mother    Diabetes Mother    Kidney disease Mother    Liver cancer Father        Living   Arthritis Father    Cancer Father        liver, prostate   COPD Father    Depression Father    Stroke Father    Heart disease Maternal Grandmother    Mental illness Maternal Grandmother    Breast cancer Maternal Grandmother    Alcohol abuse Paternal Grandfather    Breast cancer Paternal Aunt    Anesthesia problems Neg Hx    Hypotension Neg Hx    Malignant hyperthermia Neg Hx    Pseudochol deficiency Neg Hx    Colon cancer Neg Hx     Social History:  reports that she has never smoked. She has never used smokeless tobacco. She reports that she does not drink alcohol and does not use drugs.  ROS: All other review of systems were reviewed and are negative except what is noted above in HPI  Physical Exam: BP 113/74   Pulse 65   LMP 05/06/2011   Constitutional:  Alert and oriented, No acute distress. HEENT: [place] AT, moist mucus membranes.  Trachea midline, no masses. Cardiovascular:  No clubbing, cyanosis, or edema. Respiratory: Normal respiratory effort, no increased work of breathing. GI: Abdomen is soft, nontender, nondistended, no abdominal masses GU: No CVA tenderness.  Lymph: No cervical or inguinal lymphadenopathy. Skin: No rashes, bruises or suspicious  lesions. Neurologic: Grossly intact, no focal deficits, moving all 4 extremities. Psychiatric: Normal mood and affect.  Laboratory Data: Lab Results  Component Value Date   WBC 5.8 02/21/2022   HGB 10.1 (L) 02/21/2022   HCT 35.3 (L) 02/21/2022   MCV 90.5 02/21/2022   PLT 364 02/21/2022    Lab Results  Component Value Date   CREATININE 0.87 02/21/2022    No results found for: "PSA"  No results found for: "TESTOSTERONE"  Lab Results  Component Value Date   HGBA1C 6.4 (H) 02/07/2022    Urinalysis    Component Value Date/Time   COLORURINE AMBER (A) 02/19/2022 0839   APPEARANCEUR Clear 03/12/2022 1604   LABSPEC 1.020 02/19/2022 0839   PHURINE 5.0 02/19/2022 0839   GLUCOSEU Negative 03/12/2022 1604   HGBUR MODERATE (A) 02/19/2022 0839   BILIRUBINUR Negative 03/12/2022 [place] 02/19/2022 0839   PROTEINUR Negative 03/12/2022 1604   PROTEINUR 100 (A) 02/19/2022 0839   UROBILINOGEN 1.0 08/06/2021 0849   UROBILINOGEN 1.0 01/16/2015 0149   NITRITE Negative 03/12/2022 1604   NITRITE POSITIVE (A) 02/19/2022 0839   LEUKOCYTESUR Negative 03/12/2022 1604   LEUKOCYTESUR NEGATIVE 02/19/2022 0839    Lab Results  Component Value Date   LABMICR See below: 03/12/2022   WBCUA 0-5 03/12/2022   LABEPIT 0-10 03/12/2022   BACTERIA None seen 03/12/2022    Pertinent Imaging: Renal US 04/11/2022: Images reviewed and discussed with the patient  Results for orders placed during the hospital encounter of 01/14/18  DG Abd 1 View  Narrative CLINICAL DATA:  Patient for lithotripsy today.  EXAM: ABDOMEN - 1 VIEW  COMPARISON:  CT abdomen and pelvis 12/30/2017.  KUB 01/11/2018.  FINDINGS: Although visualization is somewhat difficult due to gas and stool, stone within the left renal pelvis is unchanged. 2 small stones in the upper pole of the right kidney are also unchanged. No evidence of ureteral stone is identified. Bowel gas pattern  is nonobstructive.  IMPRESSION: Stone in the left renal pelvis and 2 stones in the upper pole of the right kidney are unchanged. Visualization is limited due to extensive gas and stool.   Electronically Signed By: Inge Rise M.D. On: 01/14/2018 13:47  No results found for this or any previous visit.  No results found for this or any previous visit.  No results found for this or any previous visit.  Results for orders placed during the hospital encounter of 04/11/22  Ultrasound renal complete  Narrative CLINICAL DATA:  Nephrolithiasis  EXAM: RENAL / URINARY TRACT ULTRASOUND COMPLETE  COMPARISON:  CT abdomen and pelvis 02/07/2022  FINDINGS: Right Kidney:  Renal measurements: 10.4 x 5.5 x 6.5 cm = volume: 194 mL. Cortical thinning. Normal cortical echogenicity. No mass or hydronephrosis. A few tiny nonspecific echogenic foci are seen but none definitely shadow.  Left Kidney:  Renal measurements: 12.3 x 5.5 x 5.3 cm = volume: 190 mL. Cortical thinning. Normal cortical echogenicity.  Bladder:  Bladder decompressed, unable to adequately visualized.  Other:  N/A  IMPRESSION: Significant cortical thinning bilaterally.  No renal mass, hydronephrosis or definite shadowing calcification.  Tiny calculi seen on prior CT exam are not definitely visualized sonographically.   Electronically Signed By: Lavonia Dana M.D. On: 04/11/2022 16:30  No valid procedures specified. No results found for this or any previous visit.  Results for orders placed during the hospital encounter of 02/06/22  CT RENAL STONE STUDY  Narrative CLINICAL DATA:  Right flank pain.  EXAM: CT ABDOMEN AND PELVIS WITHOUT CONTRAST  TECHNIQUE: Multidetector CT imaging of the abdomen and pelvis was performed following the standard protocol without IV contrast.  RADIATION DOSE REDUCTION: This exam was performed according to the departmental dose-optimization program which includes  automated exposure control, adjustment of the mA and/or kV according to patient size and/or use of iterative reconstruction technique.  COMPARISON:  02/04/2022  FINDINGS: Lower chest: Bibasilar scarring and atelectasis. No pleural effusions. The heart is normal in size. No pericardial effusion.  Hepatobiliary: No hepatic lesions or intrahepatic biliary dilatation. The gallbladder is surgically absent. No common bile duct dilatation.  Pancreas: No mass, inflammation ductal dilatation.  Spleen: Normal size.  No focal lesions.  Adrenals/Urinary Tract: The adrenal glands are normal.  New moderate right-sided hydronephrosis and hydroureter. There are 2 ureteral calculi. The more proximal calculus is at the L5 level and measures a maximum of or 6 mm. The more distal calculus is located at the mid sacral level and also measures 6 mm.  Bilateral renal calculi. There is a 6 mm calculus in the left extra renal pelvis but no obstructing left ureteral calculi. No bladder calculi. No worrisome renal or bladder lesions are identified without contrast.  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 The duodenum, small bowel and colon unremarkable.  Vascular/Lymphatic: Stable scattered atherosclerotic calcifications involving the aorta and iliac arteries but no aneurysm. No mesenteric or retroperitoneal lymphadenopathy. A retroaortic left renal vein is noted incidentally.  Reproductive: Surgically absent.  Other: Small amount of perinephric fluid on the right side also tracking down along the right ureter and into the pelvis.  Musculoskeletal: No significant bony findings.  IMPRESSION: 1. Two new 6 mm right ureteral calculi causing moderate right-sided hydronephrosis and hydroureter. 2. Bilateral renal calculi. 3. 6 mm calculus in the left extra renal pelvis but no obstructing left ureteral calculi. 4. Surgical changes from gastric  bypass surgery without complicating features. 5. Status post cholecystectomy. No biliary dilatation. 6. Aortic atherosclerosis.  Aortic Atherosclerosis (ICD10-I70.0).   Electronically Signed By: Marijo Sanes M.D. On: 02/07/2022 10:13   Assessment & Plan:    1. Renal calculus -dietary handout given -followup 6 months with renal US - Urinalysis, Routine w reflex microscopic   No follow-ups on file.  Nicolette Bang, MD  [facility] [place]

## 2022-06-27 NOTE — Patient Instructions (Signed)

## 2022-07-01 ENCOUNTER — Encounter (HOSPITAL_COMMUNITY): Payer: Self-pay | Admitting: Hematology and Oncology

## 2022-07-22 ENCOUNTER — Encounter: Payer: Self-pay | Admitting: Physical Medicine & Rehabilitation

## 2022-07-22 ENCOUNTER — Encounter (HOSPITAL_COMMUNITY): Payer: Self-pay | Admitting: Hematology and Oncology

## 2022-07-24 ENCOUNTER — Encounter: Payer: Commercial Managed Care - HMO | Admitting: Physical Medicine & Rehabilitation

## 2022-08-08 ENCOUNTER — Other Ambulatory Visit: Payer: Self-pay | Admitting: Urology

## 2022-08-18 ENCOUNTER — Encounter
Payer: Commercial Managed Care - HMO | Attending: Physical Medicine & Rehabilitation | Admitting: Physical Medicine & Rehabilitation

## 2022-08-18 ENCOUNTER — Encounter: Payer: Self-pay | Admitting: Physical Medicine & Rehabilitation

## 2022-08-18 VITALS — BP 103/71 | HR 85 | Ht 64.0 in | Wt 244.8 lb

## 2022-08-18 DIAGNOSIS — Z79891 Long term (current) use of opiate analgesic: Secondary | ICD-10-CM

## 2022-08-18 DIAGNOSIS — G894 Chronic pain syndrome: Secondary | ICD-10-CM

## 2022-08-18 DIAGNOSIS — Z5181 Encounter for therapeutic drug level monitoring: Secondary | ICD-10-CM | POA: Diagnosis not present

## 2022-08-18 DIAGNOSIS — M170 Bilateral primary osteoarthritis of knee: Secondary | ICD-10-CM | POA: Diagnosis present

## 2022-08-18 DIAGNOSIS — M797 Fibromyalgia: Secondary | ICD-10-CM | POA: Diagnosis not present

## 2022-08-18 NOTE — Progress Notes (Addendum)
Subjective:    Patient ID: Molly Berry, female    DOB: [birth year], 57 y.o.   MRN: [id number]  HPI   Molly Berry is a 57 y.o. year old female  who  has a past medical history of Allergy, Anemia, Anxiety, Arthritis, Asthma, Bell palsy, Blood transfusion without reported diagnosis, Breast mass, Chronic kidney disease, Chronic pain syndrome, COPD (chronic obstructive pulmonary disease), Depression, Diabetes mellitus, Endometrial cancer, Fibromyalgia, GERD (gastroesophageal reflux disease), H/O gastric bypass (05/09/2014), Headache, Hyperlipidemia, Hypertension, Hypothyroidism, Multiple pulmonary nodules (11/30/2012), Multiple thyroid nodules, Nephropathy, Pneumonia, PONV (postoperative nausea and vomiting), Pulmonary nodules, Thyroid disease, Ulcer, and Uterine cancer (12/26/2011).   They are presenting to PM&R clinic as a new patient for pain management evaluation.  Molly Berry reports that most of her pain is related to her history of fibromyalgia and osteoarthritis particular of her knees.  She has widespread pain throughout her whole body from fibromyalgia.  She has had knee pain for several years and her knees are her worst area of pain. It was initially worse in her right knee, then her left knee became more severe.  She reports she had cortisone injections with slight benefit however she cannot get gel injections approved by insurance.  She would like to have knee surgery and is followed by orthopedics most recently seen by Legrand Como, PA.  She is working on weight loss in order to be able to qualify for surgery.  She was previously seen by [facility] pain [facility].  Her pain was previously managed with oxycodone 10 mg 3 times a day.  She reports she was with this practice for about 7 years.  She does report she was discharged after she took a tramadol by a family member gave her when she had had multiple procedures and surgeries for renal stones.  Since this time her PCP has been prescribing her  oxycodone 10 mg.   No red flags for back pain endorsed in Hx or ROS  Medications tried: Nsaids meloxicam helps, rare use due to concern for kidneys Tylenol - helps slightly  Opiates  oxycodone  helps, has used this for many years Gabapentin  BID  Lyrica- reports reason she can't take it but doesn't remember what that was  Lamictal for mood  Buprenorphine - bad feeling  Tramadol didn't help  TCAs  - didn't help SNRIs  cymbalta, did not tolerate it  Other   Tizanidine/robaxin slightly helps   Other treatments: PT/OT  - helped in the past, she likes "integrative therapy" TENs unit - made her sore  Injections - knee cortisone   Prior UDS results:     Component Value Date/Time   LABOPIA NONE DETECTED 08/19/2012 0010   COCAINSCRNUR NONE DETECTED 08/19/2012 0010   LABBENZ POSITIVE (A) 08/19/2012 0010   AMPHETMU NONE DETECTED 08/19/2012 0010   THCU NONE DETECTED 08/19/2012 0010   LABBARB NONE DETECTED 08/19/2012 0010      Pain Inventory Average Pain 7 Pain Right Now 8 My pain is intermittent, sharp, burning, dull, and aching  In the last 24 hours, has pain interfered with the following? General activity 8 Relation with others 7 Enjoyment of life 9 What TIME of day is your pain at its worst? morning  and night Sleep (in general) Poor  Pain is worse with: walking, bending, and standing Pain improves with: rest, heat/ice, medication, and injections Relief from Meds: 7  how many minutes can you walk? depends ability to climb steps?  yes do you drive?  yes  not employed: date last employed . I need assistance with the following:  meal prep, household duties, and shopping  weakness numbness tremor tingling trouble walking spasms dizziness depression anxiety  Any changes since last visit?  no  Any changes since last visit?  no    Family History  Problem Relation Age of Onset   Pneumonia Mother        Deceased   Arthritis Mother    Asthma  Mother    Cancer Mother        pancreatic   COPD Mother    Depression Mother    Diabetes Mother    Kidney disease Mother    Liver cancer Father        Living   Arthritis Father    Cancer Father        liver, prostate   COPD Father    Depression Father    Stroke Father    Heart disease Maternal Grandmother    Mental illness Maternal Grandmother    Breast cancer Maternal Grandmother    Alcohol abuse Paternal Grandfather    Breast cancer Paternal Aunt    Anesthesia problems Neg Hx    Hypotension Neg Hx    Malignant hyperthermia Neg Hx    Pseudochol deficiency Neg Hx    Colon cancer Neg Hx    Social History   Socioeconomic History   Marital status: Married    Spouse name: Fayrene Fearing "Rusty"   Number of children: Not on file   Years of education: 16   Highest education level: Not on file  Occupational History   Occupation: Curator employed)    Employer: NOT EMPLOYED  Tobacco Use   Smoking status: Never   Smokeless tobacco: Never  Vaping Use   Vaping Use: Never used  Substance and Sexual Activity   Alcohol use: No    Alcohol/week: 0.0 standard drinks of alcohol   Drug use: No   Sexual activity: Yes    Birth control/protection: None, Surgical  Other Topics Concern   Not on file  Social History Narrative   Lives with husband and 2 sons 57 and 15. (adopted)    Wants to work but says she is not dependable.  She does do volunteer work.    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  Working on disability.     Education: bachelors degree.   Disabled because of fibromyalgia and depression since 2010   Social Determinants of Health   Financial Resource Strain: Not on file  Food Insecurity: No Food Insecurity (02/20/2022)   Hunger Vital Sign    Worried About Running Out of Food in the Last Year: Never true    Ran Out of Food in the Last Year: Never true  Transportation Needs: No Transportation Needs (02/06/2022)   PRAPARE - Administrator, Civil Service  (Medical): No    Lack of Transportation (Non-Medical): No  Physical Activity: Not on file  Stress: Not on file  Social Connections: Not on file   Past Surgical History:  Procedure Laterality Date   ABDOMINAL HYSTERECTOMY     endometrial cancer   CHOLECYSTECTOMY     COLONOSCOPY WITH PROPOFOL N/A 01/19/2014   HQI:[id number]   CYSTOSCOPY W/ URETERAL STENT PLACEMENT Bilateral 02/07/2022   Procedure: CYSTOSCOPY WITH RETROGRADE PYELOGRAM/RIGHT URETERAL STENT PLACEMENT WITH POSSIBLE LEFT;  Surgeon: Jannifer Hick, MD;  Location: [facility];  Service: Urology;  Laterality: Bilateral;   CYSTOSCOPY WITH RETROGRADE PYELOGRAM, URETEROSCOPY AND  STENT PLACEMENT Bilateral 03/06/2022   Procedure: CYSTOSCOPY WITH RETROGRADE PYELOGRAM, URETEROSCOPY AND STENT EXCHANGE;  Surgeon: Malen Gauze, MD;  Location: [facility];  Service: Urology;  Laterality: Bilateral;   DILATION AND CURETTAGE OF UTERUS  12 yrs ago   DILATION AND CURETTAGE OF UTERUS  06/18/2011   Procedure: DILATATION AND CURETTAGE;  Surgeon: Lazaro Arms, MD;  Location: [facility];  Service: Gynecology;  Laterality: N/A;  Suction Dilation and Curettage   ESOPHAGOGASTRODUODENOSCOPY N/A 10/12/2013   Dr. Rourk:anastomotic ulcer likely cause of bleeding. likely ischemic    ESOPHAGOGASTRODUODENOSCOPY (EGD) WITH PROPOFOL N/A 01/19/2014   [id number]   EXTRACORPOREAL SHOCK WAVE LITHOTRIPSY Left 01/11/2018   Procedure: LEFT EXTRACORPOREAL SHOCK WAVE LITHOTRIPSY (ESWL);  Surgeon: Crista Elliot, MD;  Location: [facility];  Service: Urology;  Laterality: Left;   EXTRACORPOREAL SHOCK WAVE LITHOTRIPSY Left 01/14/2018   Procedure: LEFT EXTRACORPOREAL SHOCK WAVE LITHOTRIPSY (ESWL);  Surgeon: Malen Gauze, MD;  Location: [facility];  Service: Urology;  Laterality: Left;  75 MINS  W/ MAC   GASTRIC BYPASS  2013   [facility]   HOLMIUM LASER APPLICATION Bilateral 03/06/2022   Procedure: HOLMIUM LASER APPLICATION;  Surgeon: Malen Gauze, MD;  Location: [facility];   Service: Urology;  Laterality: Bilateral;   HYSTEROSCOPY WITH D & C  06/18/2011   Procedure: DILATATION AND CURETTAGE /HYSTEROSCOPY;  Surgeon: Lazaro Arms, MD;  Location: [facility];  Service: Gynecology;  Laterality: N/A;   LITHOTRIPSY     paniculectomy     SHOULDER ARTHROSCOPY WITH SUBACROMIAL DECOMPRESSION AND OPEN ROTATOR C Left 07/16/2021   Procedure: LEFT ARTHROSCOPIC SUBACROMIAL DECOMPRESSION, MINI OPEN ROTATOR CUFF TEAR REPAIR, BICEPS TENOTOMY;  Surgeon: Valeria Batman, MD;  Location: [facility];  Service: Orthopedics;  Laterality: Left;   STONE EXTRACTION WITH BASKET Bilateral 03/06/2022   Procedure: STONE EXTRACTION WITH BASKET;  Surgeon: Malen Gauze, MD;  Location: [facility];  Service: Urology;  Laterality: Bilateral;   TRIGGER FINGER RELEASE     WISDOM TOOTH EXTRACTION     Past Medical History:  Diagnosis Date   Allergy    Anemia    Anxiety    Arthritis    knees, multiple joints   Asthma    Bell palsy    Blood transfusion without reported diagnosis    Breast mass    lt breast mass x's 3 years increased in size   Chronic kidney disease    Chronic pain syndrome    COPD (chronic obstructive pulmonary disease)    Depression    Diabetes mellitus    Endometrial cancer    Fibromyalgia    GERD (gastroesophageal reflux disease)    H/O gastric bypass 05/09/2014   At [facility]   Headache    mirgraines   Hyperlipidemia    Hypertension    Hypothyroidism    Multiple pulmonary nodules 11/30/2012   Followed in Pulmonary clinic/ [facility]/ [place]  - See CT abd  10/29/12  New right lower lobe pulmonary nodularity, primarily ground-  glass in density. This could reflect an inflammatory process,  although follow-up is necessary to exclude atypical neoplasm. Full  chest CT should be considered to evaluate for other pulmonary  findings.      Multiple thyroid nodules    Nephropathy    Pneumonia    PONV (postoperative nausea and vomiting)    Pulmonary nodules    Thyroid disease     Ulcer    Uterine cancer 12/26/2011   Stage 1, grade 1,  S/P hysterectomy initially by robotic technique and then salping[facility]oophorectomy at [facility] receiving no postoperative treatment.   BP 103/71   Pulse 85   Ht 5\' 4"  (1.626 m)   Wt 244 lb 12.8 oz (111 kg)   LMP 05/06/2011   SpO2 92%   BMI 42.02 kg/m   Opioid Risk Score:   Fall Risk Score:  `1  Depression screen [facility] 2/9     08/18/2022    2:11 PM 10/30/2016    4:49 PM 07/07/2016    2:11 PM 03/09/2015    3:28 PM 05/25/2014    2:05 PM 03/02/2014   11:59 AM 02/28/2014    2:58 PM  Depression screen PHQ 2/9  Decreased Interest 2 0 0 0 0 2 0  Down, Depressed, Hopeless 2 0 0 0 0 2 0  PHQ - 2 Score 4 0 0 0 0 4 0  Altered sleeping 3        Tired, decreased energy 2        Change in appetite 2        Feeling bad or failure about yourself  2        Trouble concentrating 2        Moving slowly or fidgety/restless 0        Suicidal thoughts 0        PHQ-9 Score 15        Difficult doing work/chores Extremely dIfficult           Review of Systems  Constitutional:  Positive for chills and unexpected weight change.       Gain  HENT: Negative.    Eyes: Negative.   Respiratory:  Positive for shortness of breath.   Cardiovascular: Negative.   Gastrointestinal:  Positive for abdominal pain, constipation, diarrhea and nausea.  Endocrine: Negative.   Genitourinary: Negative.   Musculoskeletal:  Positive for arthralgias, gait problem and myalgias.  Skin:  Positive for rash.  Allergic/Immunologic: Negative.   Neurological:  Positive for dizziness, weakness and numbness.       Tingling  Hematological: Negative.   Psychiatric/Behavioral:  Positive for dysphoric mood. The patient is nervous/anxious.   All other systems reviewed and are negative.      Objective:   Physical Exam  Gen: no distress, normal appearing HEENT: oral mucosa pink and moist, NCAT Cardio: Reg rate Chest: normal effort, normal rate of breathing Abd: soft,  non-distended Ext: no edema Psych: pleasant, normal affect Skin: intact Neuro: Alert and awake, follows commands, cranial nerves II through XII grossly intact DTR normal and symmetric Strength 5 out of 5 in all 4 extremities Sensation light touch intact in all 4 extremities No dysmetria or ataxia No ankle clonus Musculoskeletal: No knee erythema, warmth or effusion Bilateral knee joint line tenderness to palpation Pain with varus and valgus stress to her knees bilaterally SLR negative-cause pain in her thighs Spurling's negative She is tender to palpation to a lesser degree than her knees throughout her paraspinal muscles, shoulders, periscapular muscles, elbows, wrist, fingers, greater trochanters, ankles Slightly decreased L-spine and C-spine ROM in all directions  Xray L knee result 03/10/22 "Three-view radiographs of the left knee were obtained today.  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  She has periarticular  osteophytes no acute fractures are noted "  Xray L knee result 12/02/21 " Radiographs of her right knee in 3  projections were reviewed today.  She  has tricompartmental arthritis with most changes in the medial compartment  with near bone-on-bone.  Also patellofemoral sclerosis and osteophyte  formation no acute fractures "     Assessment & Plan:   Knee OA -Foods for pain list provided -Consider continuation of oxycodone after results of UDS, she is currently taking 10 mg 3 times daily.  She reports this helps but sometimes does not last throughout the day.  Consider 7.5 mg 4 times daily to provide more consistent pain relief. -Pain agreement today -Patient reports she was unable to get viscosupplementation injection due to insurance consider retrying -Consider genicular nerve ablation -She reports she is planning to try  to get TKA in a few months after she loses some weight -ORT low   Fibromyalgia  -Continue tizanidine as needed -Continue gabapentin 1200 mg twice daily -Discussed gradual progressive low intensity exercise -Consult for aqua therapy placed -Zynex Nexwave device ordered-she tried this device today and reports it provided good relief -Zynex Cryoheat blanket ordered-she tried this device today and reports it provided good relief   09/01/22 Called patient, medication ordered as discussed, offered narcan-she says she already has this -percocet not available at her pharmacy, will change to oxycodone 7.5 without APAP

## 2022-08-22 LAB — TOXASSURE SELECT,+ANTIDEPR,UR

## 2022-08-27 ENCOUNTER — Telehealth: Payer: Self-pay | Admitting: *Deleted

## 2022-08-27 NOTE — Telephone Encounter (Signed)
Urine drug screen for this encounter is consistent for prescribed medication 

## 2022-09-01 ENCOUNTER — Telehealth: Payer: Self-pay

## 2022-09-01 ENCOUNTER — Telehealth: Payer: Self-pay | Admitting: *Deleted

## 2022-09-01 MED ORDER — OXYCODONE-ACETAMINOPHEN 7.5-325 MG PO TABS: 1.0000 | ORAL_TABLET | Freq: Four times a day (QID) | ORAL | 0 refills | Status: DC | PRN

## 2022-09-01 NOTE — Addendum Note (Signed)
Addended by: Fanny Dance on: 09/01/2022 03:55 PM   Modules accepted: Orders

## 2022-09-01 NOTE — Telephone Encounter (Unsigned)
Molly Berry's pharmacy does not have Percocet/Tylenol. Please send Percocet w/o APAP.

## 2022-09-01 NOTE — Telephone Encounter (Signed)
Vinita Prentiss called for a refill of pain medication. Patient stated she will need a refill on 09/03/2022.   She is asking for an increase to Oxycodone 7.5 MG? Because the afternoon pain level is up and it is bad. With the Oxycodone 10 MG the pain returns after 3 hours.   Call back phone [phone].   Filled  Written  ID  Drug  QTY  Days  Prescriber  RX #  Dispenser  Refill  Daily Dose*  Pymt Type  PMP  08/08/2022 04/25/2022 1  Alprazolam 1 Mg Tablet 90.00 30 Ru [place] [id number] Lay (5785) 3/3 6.00 LME Comm Ins [place] 08/05/2022 08/05/2022 1  Oxycodone Hcl (Ir) 10 Mg Tab 90.00 30 Sh Bea [id number] Lay ([id number]) 0/5 45.00 MME Private Pay [place] 07/07/2022 07/07/2022 1  Oxycodone Hcl (Ir) 10 Mg Tab 90.00 30 Sh Bea [id number] Lay ([id number]) 0/0 45.00 MME Comm Ins 

## 2022-09-02 NOTE — Telephone Encounter (Signed)
Patient called back Rx will need to go to [facility] on [address], in [place]  . (Prior Rx is not in the system per [facility]).

## 2022-09-03 MED ORDER — OXYCODONE-ACETAMINOPHEN 7.5-325 MG PO TABS: 1.0000 | ORAL_TABLET | Freq: Four times a day (QID) | ORAL | 0 refills | Status: AC | PRN

## 2022-09-04 ENCOUNTER — Telehealth: Payer: Self-pay | Admitting: *Deleted

## 2022-09-04 NOTE — Telephone Encounter (Signed)
PA submitted Oxycodone 7.5 via covermymeds. Patient made aware

## 2022-09-05 NOTE — Telephone Encounter (Signed)
Molly Berry (Key: [id number]) - [id number] oxyCODONE-Acetaminophen 7.5-325MG  tablets Status: PA Response - ApprovedCreated: May 2nd, [id number]

## 2022-09-08 ENCOUNTER — Encounter (HOSPITAL_COMMUNITY): Payer: Self-pay | Admitting: Hematology and Oncology

## 2022-09-12 ENCOUNTER — Other Ambulatory Visit: Payer: Self-pay | Admitting: Urology

## 2022-09-15 ENCOUNTER — Telehealth: Payer: Self-pay

## 2022-09-15 ENCOUNTER — Encounter
Payer: Commercial Managed Care - HMO | Attending: Physical Medicine & Rehabilitation | Admitting: Physical Medicine & Rehabilitation

## 2022-09-15 ENCOUNTER — Ambulatory Visit (INDEPENDENT_AMBULATORY_CARE_PROVIDER_SITE_OTHER): Payer: Commercial Managed Care - HMO

## 2022-09-15 ENCOUNTER — Encounter: Payer: Self-pay | Admitting: Physical Medicine & Rehabilitation

## 2022-09-15 VITALS — BP 111/76 | HR 80 | Ht 64.0 in | Wt 232.0 lb

## 2022-09-15 DIAGNOSIS — R300 Dysuria: Secondary | ICD-10-CM

## 2022-09-15 DIAGNOSIS — R109 Unspecified abdominal pain: Secondary | ICD-10-CM | POA: Diagnosis present

## 2022-09-15 DIAGNOSIS — G894 Chronic pain syndrome: Secondary | ICD-10-CM | POA: Insufficient documentation

## 2022-09-15 DIAGNOSIS — M797 Fibromyalgia: Secondary | ICD-10-CM | POA: Insufficient documentation

## 2022-09-15 DIAGNOSIS — M170 Bilateral primary osteoarthritis of knee: Secondary | ICD-10-CM | POA: Insufficient documentation

## 2022-09-15 DIAGNOSIS — Z79891 Long term (current) use of opiate analgesic: Secondary | ICD-10-CM | POA: Diagnosis not present

## 2022-09-15 MED ORDER — NITROFURANTOIN MONOHYD MACRO 100 MG PO CAPS: 100.0000 mg | ORAL_CAPSULE | Freq: Two times a day (BID) | ORAL | 0 refills | Status: DC

## 2022-09-15 NOTE — Addendum Note (Signed)
Addended by: Fanny Dance on: 09/15/2022 11:49 AM   Modules accepted: Orders

## 2022-09-15 NOTE — Progress Notes (Signed)
Subjective:    Patient ID: Molly Berry, female    DOB: 06/13/1965, 57 y.o.   MRN: [id number]  HPI  HPI 08/18/22 Molly Berry is a 57 y.o. year old female  who  has a past medical history of Allergy, Anemia, Anxiety, Arthritis, Asthma, Bell palsy, Blood transfusion without reported diagnosis, Breast mass, Chronic kidney disease, Chronic pain syndrome, COPD (chronic obstructive pulmonary disease), Depression, Diabetes mellitus, Endometrial cancer, Fibromyalgia, GERD (gastroesophageal reflux disease), H/O gastric bypass (05/09/2014), Headache, Hyperlipidemia, Hypertension, Hypothyroidism, Multiple pulmonary nodules (11/30/2012), Multiple thyroid nodules, Nephropathy, Pneumonia, PONV (postoperative nausea and vomiting), Pulmonary nodules, Thyroid disease, Ulcer, and Uterine cancer (12/26/2011).   They are presenting to PM&R clinic as a new patient for pain management evaluation.  Molly Berry reports that most of her pain is related to her history of fibromyalgia and osteoarthritis particular of her knees.  She has widespread pain throughout her whole body from fibromyalgia.  She has had knee pain for several years and her knees are her worst area of pain. It was initially worse in her right knee, then her left knee became more severe.  She reports she had cortisone injections with slight benefit however she cannot get gel injections approved by insurance.  She would like to have knee surgery and is followed by orthopedics most recently seen by Molly Como, PA.  She is working on weight loss in order to be able to qualify for surgery.  She was previously seen by [facility] pain [facility].  Her pain was previously managed with oxycodone 10 mg 3 times a day.  She reports she was with this practice for about 7 years.  She does report she was discharged after she took a tramadol by a family member gave her when she had had multiple procedures and surgeries for renal stones.  Since this time her PCP has been  prescribing her oxycodone 10 mg.     No red flags for back pain endorsed in Hx or ROS   Medications tried: Nsaids meloxicam helps, rare use due to concern for kidneys Tylenol - helps slightly  Opiates  oxycodone 10mg  helps, has used this for many years Gabapentin 1200mg  BID  Lyrica- reports reason she can't take it but doesn't remember what that was  Lamictal for mood  Buprenorphine - bad feeling  Tramadol didn't help  TCAs  - didn't help SNRIs  cymbalta, did not tolerate it  Other   Tizanidine/robaxin slightly helps    Other treatments: PT/OT  - helped in the past, she likes "integrative therapy" TENs unit - made her sore  Injections - knee cortisone   Interval history 09/15/2022 Patient also reports she has been doing well overall regarding her pain control.  Her pain has been severe in her right back and flank recently.  She reports follow-up with urology today in clinic, says she has had multiple issues with kidney stones.  She also had a UA ordered today.  She was started on Macrobid.  Prior to this her pain was much better controlled on Percocet 7.5 mg 4 times a day.  This was providing more consistent pain control than Percocet 10 mg 3 times a day.  Patient reports she got message from [facility] however needs to call them back.  Pain Inventory Average Pain 6 Pain Right Now 10 My pain is sharp, burning, and stabbing  In the last 24 hours, has pain interfered with the following? General activity 10 Relation with others 10 Enjoyment of life 10  What TIME of day is your pain at its worst? varies Sleep (in general) Poor  Pain is worse with: walking, bending, inactivity, and standing Pain improves with: heat/ice and medication Relief from Meds: 5  Family History  Problem Relation Age of Onset   Pneumonia Mother        Deceased   Arthritis Mother    Asthma Mother    Cancer Mother        pancreatic   COPD Mother    Depression Mother    Diabetes Mother    Kidney disease  Mother    Liver cancer Father        Living   Arthritis Father    Cancer Father        liver, prostate   COPD Father    Depression Father    Stroke Father    Heart disease Maternal Grandmother    Mental illness Maternal Grandmother    Breast cancer Maternal Grandmother    Alcohol abuse Paternal Grandfather    Breast cancer Paternal Aunt    Anesthesia problems Neg Hx    Hypotension Neg Hx    Malignant hyperthermia Neg Hx    Pseudochol deficiency Neg Hx    Colon cancer Neg Hx    Social History   Socioeconomic History   Marital status: Married    Spouse name: Molly Fearing "Rusty"   Number of children: Not on file   Years of education: 16   Highest education level: Not on file  Occupational History   Occupation: Curator employed)    Employer: NOT EMPLOYED  Tobacco Use   Smoking status: Never   Smokeless tobacco: Never  Vaping Use   Vaping Use: Never used  Substance and Sexual Activity   Alcohol use: No    Alcohol/week: 0.0 standard drinks of alcohol   Drug use: No   Sexual activity: Yes    Birth control/protection: None, Surgical  Other Topics Concern   Not on file  Social History Narrative   Lives with husband and 2 sons 9 and 15. (adopted)    Wants to work but says she is not dependable.  She does do volunteer work.    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  Working on disability.     Education: bachelors degree.   Disabled because of fibromyalgia and depression since 2010   Social Determinants of Health   Financial Resource Strain: Not on file  Food Insecurity: No Food Insecurity (02/20/2022)   Hunger Vital Sign    Worried About Running Out of Food in the Last Year: Never true    Ran Out of Food in the Last Year: Never true  Transportation Needs: No Transportation Needs (02/06/2022)   PRAPARE - Administrator, Civil Service (Medical): No    Lack of Transportation (Non-Medical): No  Physical Activity: Not on file  Stress: Not on file   Social Connections: Not on file   Past Surgical History:  Procedure Laterality Date   ABDOMINAL HYSTERECTOMY     endometrial cancer   CHOLECYSTECTOMY     COLONOSCOPY WITH PROPOFOL N/A 01/19/2014   [id number]   CYSTOSCOPY W/ URETERAL STENT PLACEMENT Bilateral 02/07/2022   Procedure: CYSTOSCOPY WITH RETROGRADE PYELOGRAM/RIGHT URETERAL STENT PLACEMENT WITH POSSIBLE LEFT;  Surgeon: Jannifer Hick, MD;  Location: [facility];  Service: Urology;  Laterality: Bilateral;   CYSTOSCOPY WITH RETROGRADE PYELOGRAM, URETEROSCOPY AND STENT PLACEMENT Bilateral 03/06/2022   Procedure: CYSTOSCOPY WITH RETROGRADE PYELOGRAM, URETEROSCOPY AND STENT EXCHANGE;  Surgeon: Malen Gauze, MD;  Location: [facility];  Service: Urology;  Laterality: Bilateral;   DILATION AND CURETTAGE OF UTERUS  12 yrs ago   DILATION AND CURETTAGE OF UTERUS  06/18/2011   Procedure: DILATATION AND CURETTAGE;  Surgeon: Lazaro Arms, MD;  Location: [facility];  Service: Gynecology;  Laterality: N/A;  Suction Dilation and Curettage   ESOPHAGOGASTRODUODENOSCOPY N/A 10/12/2013   Dr. Rourk:anastomotic ulcer likely cause of bleeding. likely ischemic    ESOPHAGOGASTRODUODENOSCOPY (EGD) WITH PROPOFOL N/A 01/19/2014   [id number]   EXTRACORPOREAL SHOCK WAVE LITHOTRIPSY Left 01/11/2018   Procedure: LEFT EXTRACORPOREAL SHOCK WAVE LITHOTRIPSY (ESWL);  Surgeon: Crista Elliot, MD;  [facility]: [facility];  Service: Urology;  Laterality: Left;   EXTRACORPOREAL SHOCK WAVE LITHOTRIPSY Left 01/14/2018   Procedure: LEFT EXTRACORPOREAL SHOCK WAVE LITHOTRIPSY (ESWL);  Surgeon: Malen Gauze, MD;  Location: [facility];  Service: Urology;  Laterality: Left;  75 MINS  W/ MAC   GASTRIC BYPASS  2013   [facility]   HOLMIUM LASER APPLICATION Bilateral 03/06/2022   Procedure: HOLMIUM LASER APPLICATION;  Surgeon: Malen Gauze, MD;  Location: [facility];  Service: Urology;  Laterality: Bilateral;   HYSTEROSCOPY WITH D & C  06/18/2011   Procedure: DILATATION AND  CURETTAGE /HYSTEROSCOPY;  Surgeon: Lazaro Arms, MD;  Location: [facility];  Service: Gynecology;  Laterality: N/A;   LITHOTRIPSY     paniculectomy     SHOULDER ARTHROSCOPY WITH SUBACROMIAL DECOMPRESSION AND OPEN ROTATOR C Left 07/16/2021   Procedure: LEFT ARTHROSCOPIC SUBACROMIAL DECOMPRESSION, MINI OPEN ROTATOR CUFF TEAR REPAIR, BICEPS TENOTOMY;  Surgeon: Valeria Batman, MD;  [facility]: [facility];  Service: Orthopedics;  Laterality: Left;   STONE EXTRACTION WITH BASKET Bilateral 03/06/2022   Procedure: STONE EXTRACTION WITH BASKET;  Surgeon: Malen Gauze, MD;  [facility]: [facility];  Service: Urology;  Laterality: Bilateral;   TRIGGER FINGER RELEASE     WISDOM TOOTH EXTRACTION     Past Surgical History:  Procedure Laterality Date   ABDOMINAL HYSTERECTOMY     endometrial cancer   CHOLECYSTECTOMY     COLONOSCOPY WITH PROPOFOL N/A 01/19/2014   [id number]   CYSTOSCOPY W/ URETERAL STENT PLACEMENT Bilateral 02/07/2022   Procedure: CYSTOSCOPY WITH RETROGRADE PYELOGRAM/RIGHT URETERAL STENT PLACEMENT WITH POSSIBLE LEFT;  Surgeon: Jannifer Hick, MD;  [facility];  Service: Urology;  Laterality: Bilateral;   CYSTOSCOPY WITH RETROGRADE PYELOGRAM, URETEROSCOPY AND STENT PLACEMENT Bilateral 03/06/2022   Procedure: CYSTOSCOPY WITH RETROGRADE PYELOGRAM, URETEROSCOPY AND STENT EXCHANGE;  Surgeon: Malen Gauze, MD;  [facility];  Service: Urology;  Laterality: Bilateral;   DILATION AND CURETTAGE OF UTERUS  12 yrs ago   DILATION AND CURETTAGE OF UTERUS  06/18/2011   Procedure: DILATATION AND CURETTAGE;  Surgeon: Lazaro Arms, MD;  [facility]: [facility];  Service: Gynecology;  Laterality: N/A;  Suction Dilation and Curettage   ESOPHAGOGASTRODUODENOSCOPY N/A 10/12/2013   Dr. Rourk:anastomotic ulcer likely cause of bleeding. likely ischemic    ESOPHAGOGASTRODUODENOSCOPY (EGD) WITH PROPOFOL N/A 01/19/2014   AOZ:[id number]   EXTRACORPOREAL SHOCK WAVE LITHOTRIPSY Left 01/11/2018   Procedure: LEFT  EXTRACORPOREAL SHOCK WAVE LITHOTRIPSY (ESWL);  Surgeon: Crista Elliot, MD;  [facility];  Service: Urology;  Laterality: Left;   EXTRACORPOREAL SHOCK WAVE LITHOTRIPSY Left 01/14/2018   Procedure: LEFT EXTRACORPOREAL SHOCK WAVE LITHOTRIPSY (ESWL);  Surgeon: Malen Gauze, MD;  [facility];  Service: Urology;  Laterality: Left;  75 MINS  W/ MAC   GASTRIC BYPASS  2013  [facility]   HOLMIUM LASER APPLICATION Bilateral 03/06/2022   Procedure: HOLMIUM LASER APPLICATION;  Surgeon: Malen Gauze, MD;  Location: [facility];  Service: Urology;  Laterality: Bilateral;   HYSTEROSCOPY WITH D & C  06/18/2011   Procedure: DILATATION AND CURETTAGE /HYSTEROSCOPY;  Surgeon: Lazaro Arms, MD;  Location: [facility];  Service: Gynecology;  Laterality: N/A;   LITHOTRIPSY     paniculectomy     SHOULDER ARTHROSCOPY WITH SUBACROMIAL DECOMPRESSION AND OPEN ROTATOR C Left 07/16/2021   Procedure: LEFT ARTHROSCOPIC SUBACROMIAL DECOMPRESSION, MINI OPEN ROTATOR CUFF TEAR REPAIR, BICEPS TENOTOMY;  Surgeon: Valeria Batman, MD;  [facility];  Service: Orthopedics;  Laterality: Left;   STONE EXTRACTION WITH BASKET Bilateral 03/06/2022   Procedure: STONE EXTRACTION WITH BASKET;  Surgeon: Malen Gauze, MD;  [facility];  Service: Urology;  Laterality: Bilateral;   TRIGGER FINGER RELEASE     WISDOM TOOTH EXTRACTION     Past Medical History:  Diagnosis Date   Allergy    Anemia    Anxiety    Arthritis    knees, multiple joints   Asthma    Bell palsy    Blood transfusion without reported diagnosis    Breast mass    lt breast mass x's 3 years increased in size   Chronic kidney disease    Chronic pain syndrome    COPD (chronic obstructive pulmonary disease) (HCC)    Depression    Diabetes mellitus    Endometrial cancer (HCC)    Fibromyalgia    GERD (gastroesophageal reflux disease)    H/O gastric bypass 05/09/2014   At [facility]   Headache    mirgraines   Hyperlipidemia     Hypertension    Hypothyroidism    Multiple pulmonary nodules 11/30/2012   Followed in Pulmonary clinic/ [facility]/ [place]  - See CT abd  10/29/12  New right lower lobe pulmonary nodularity, primarily ground-  glass in density. This could reflect an inflammatory process,  although follow-up is necessary to exclude atypical neoplasm. Full  chest CT should be considered to evaluate for other pulmonary  findings.      Multiple thyroid nodules    Nephropathy    Pneumonia    PONV (postoperative nausea and vomiting)    Pulmonary nodules    Thyroid disease    Ulcer    Uterine cancer (HCC) 12/26/2011   Stage 1, grade 1, S/P hysterectomy initially by robotic technique and then salpingo-oophorectomy at [facility] receiving no postoperative treatment.   BP 111/76   Pulse 80   Ht 5\' 4"  (1.626 m)   Wt 232 lb (105.2 kg)   LMP 05/06/2011   BMI 39.82 kg/m   Opioid Risk Score:   Fall Risk Score:  `1  Depression screen [facility] 2/9     09/15/2022   11:06 AM 08/18/2022    2:11 PM 10/30/2016    4:49 PM 07/07/2016    2:11 PM 03/09/2015    3:28 PM 05/25/2014    2:05 PM 03/02/2014   11:59 AM  Depression screen PHQ 2/9  Decreased Interest 2 2 0 0 0 0 2  Down, Depressed, Hopeless 2 2 0 0 0 0 2  PHQ - 2 Score 4 4 0 0 0 0 4  Altered sleeping  3       Tired, decreased energy  2       Change in appetite  2       Feeling bad or failure about yourself  2       Trouble concentrating  2       Moving slowly or fidgety/restless  0       Suicidal thoughts  0       PHQ-9 Score  15       Difficult doing work/chores  Extremely dIfficult           Review of Systems  Musculoskeletal:  Positive for back pain.       Right side pain  All other systems reviewed and are negative.     Objective:   Physical Exam   Gen: no distress, normal appearing HEENT: oral mucosa pink and moist, NCAT Cardio: Reg rate Chest: normal effort, normal rate of breathing Abd: soft, non-distended Ext: no edema Psych: pleasant,  normal affect Skin: intact Neuro: Alert and awake, follows commands, cranial nerves II through XII grossly intact DTR normal and symmetric No focal motor or sensory deficits noted No dysmetria or ataxia Musculoskeletal: No knee erythema, warmth or effusion Bilateral knee joint line tenderness to palpation Slump test negative Spurling's negative She is tender to palpation to a lesser degree than her knees throughout her paraspinal muscles, shoulders, periscapular muscles, elbows, wrist, fingers, greater trochanters, ankles Slightly decreased L-spine and C-spine ROM in all directions Mild right lower back tenderness with palpation   Xray L knee result 03/10/22 "Three-view radiographs of the left knee were obtained today.  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  She has periarticular  osteophytes no acute fractures are noted "   Xray L knee result 12/02/21 " Radiographs of her right knee in 3 projections were reviewed today.  She  has tricompartmental arthritis with most changes in the medial compartment  with near bone-on-bone.  Also patellofemoral sclerosis and osteophyte  formation no acute fractures "      Assessment & Plan:   Knee OA -Foods for pain list provided -Patient reports her pain is doing better with Percocet 7.5 mg 4 times a day.  This is keeping her pain control more constant then 10 mg 3 times a day.  Continue current regimen.  Advised her to call when she is on this medication -Continue pill counts -Pain agreement last visit -Patient reports she was unable to get viscosupplementation injection due to insurance consider retrying, this may change as her insurance is different -Consider genicular nerve ablation -She reports she is planning to try to get TKA in a few months after she loses some weight -ORT low   Right back  and flank pain -Patient reports she has had prior pain in the past related to kidney stones, says she has follow-up with urology later today to review this further, appears she had a UA ordered -Continue follow-up with urology for further evaluation   Fibromyalgia  -Continue tizanidine as needed -Continue gabapentin 1200 mg twice daily -Discussed gradual progressive low intensity exercise -Consult for aqua therapy placed -Zynex Nexwave device ordered, Zynex Cryoheat blanket ordered last visit.  She reports she needs to call the company back, number provided

## 2022-09-15 NOTE — Telephone Encounter (Signed)
Patient added to [place] schedule

## 2022-09-15 NOTE — Telephone Encounter (Signed)
Patient called advising she is experiencing frequent urination along with dysuria. She requested to come by the office to drop of a urine specimen.

## 2022-09-15 NOTE — Progress Notes (Signed)
Patient presents today with complaints of dysuria.  UA and Culture done today.  Dr. Ronne Binning reviewed results and gave verbal to send in Macrobid 100 mg BID for 7 says.  Rx sent to pharmacy. Patient aware of MD recommendations and that we will reach out with culture results.      Guss Bunde, CMA

## 2022-09-19 ENCOUNTER — Telehealth: Payer: Self-pay

## 2022-09-19 NOTE — Telephone Encounter (Signed)
Open in error

## 2022-09-19 NOTE — Telephone Encounter (Signed)
I called patient to follow up on a triage call from 05/16.  Patient confirmed she was feeling better and has been taking her abx since 05/13.  She was concerned because she said she had a fever Wednesday night, when I asked what her temperature was she states "she never took her temperature she was just hot" .  She states she took tylenol and felt better at the time.  She denies a fever at this time and is doing better.  She will wait for culture results.

## 2022-09-20 LAB — URINE CULTURE

## 2022-10-06 ENCOUNTER — Telehealth: Payer: Self-pay | Admitting: *Deleted

## 2022-10-06 NOTE — Telephone Encounter (Signed)
Patient left message requesting refill on Oxycodone. I'm not seeing this medication on med list. Left message for patient to call back.

## 2022-10-07 NOTE — Telephone Encounter (Signed)
Please review Dr.Shtridelman is out of the office.  Patient called for refill of Oxycodone.  Please send to [facility] on [address] in [place].    PMP REPORT:   Filled  Written  ID  Drug  QTY  Days  Prescriber  RX #  Dispenser  Refill  Daily Dose*  Pymt Type  PMP  09/26/2022 09/26/2022 1  Alprazolam 1 Mg Tablet 90.00 30 Ru [address] [id number] Lay ([id number]) 0/2 6.00 LME Comm Ins [place] 09/05/2022 09/03/2022 1  Oxycodone-Acetaminophn 7.5-325 120.00 30 Yu Sht [id number] [facility] ([id number]) 0/0 45.00 MME Comm Ins [place]

## 2022-10-08 ENCOUNTER — Telehealth: Payer: Self-pay

## 2022-10-08 MED ORDER — OXYCODONE-ACETAMINOPHEN 7.5-325 MG PO TABS: 1.0000 | ORAL_TABLET | Freq: Four times a day (QID) | ORAL | 0 refills | Status: DC | PRN

## 2022-10-08 NOTE — Addendum Note (Signed)
Addended by: Jones Bales on: 10/08/2022 10:45 AM   Modules accepted: Orders

## 2022-10-08 NOTE — Telephone Encounter (Signed)
PMP was Reviewed.  Dr. Natale Lay note was reviewed.  Oxycodone e-scribed to pharmacy.  Call placed to Ms. Bortle, no answer. Left message to return the call.

## 2022-10-08 NOTE — Telephone Encounter (Signed)
LVM refill requested sent to pharmacy. Nothing further needed.

## 2022-10-09 NOTE — Telephone Encounter (Signed)
Task completed

## 2022-10-22 ENCOUNTER — Other Ambulatory Visit: Payer: Self-pay | Admitting: Urology

## 2022-11-05 ENCOUNTER — Other Ambulatory Visit: Payer: Self-pay

## 2022-11-05 ENCOUNTER — Ambulatory Visit (HOSPITAL_BASED_OUTPATIENT_CLINIC_OR_DEPARTMENT_OTHER): Payer: Commercial Managed Care - HMO | Attending: Physical Medicine & Rehabilitation | Admitting: Physical Therapy

## 2022-11-05 MED ORDER — OXYCODONE-ACETAMINOPHEN 7.5-325 MG PO TABS: 1.0000 | ORAL_TABLET | Freq: Four times a day (QID) | ORAL | 0 refills | Status: DC | PRN

## 2022-11-05 NOTE — Telephone Encounter (Signed)
Refill request for Oxycodone/[facility]. Next appt 11/25/22

## 2022-11-24 ENCOUNTER — Telehealth: Payer: Self-pay | Admitting: Physical Medicine & Rehabilitation

## 2022-11-24 NOTE — Telephone Encounter (Signed)
She had to call and reschedule her appt but she wanted to let him know she would be out of her medicine on the 6th or 7th

## 2022-11-25 ENCOUNTER — Encounter: Payer: Commercial Managed Care - HMO | Admitting: Physical Medicine & Rehabilitation

## 2022-11-26 MED ORDER — OXYCODONE-ACETAMINOPHEN 7.5-325 MG PO TABS: 1.0000 | ORAL_TABLET | Freq: Four times a day (QID) | ORAL | 0 refills | Status: DC | PRN

## 2022-11-27 NOTE — Telephone Encounter (Signed)
Notified. She has appt on 12/29/22.

## 2022-12-25 ENCOUNTER — Ambulatory Visit (HOSPITAL_COMMUNITY)
Admission: RE | Admit: 2022-12-25 | Discharge: 2022-12-25 | Disposition: A | Discharge status: Home / Self Care | Payer: Commercial Managed Care - HMO | Source: Ambulatory Visit | Attending: Urology | Admitting: Urology

## 2022-12-25 DIAGNOSIS — N200 Calculus of kidney: Secondary | ICD-10-CM | POA: Diagnosis present

## 2022-12-26 ENCOUNTER — Ambulatory Visit: Payer: Commercial Managed Care - HMO | Admitting: Urology

## 2022-12-29 ENCOUNTER — Encounter: Payer: Commercial Managed Care - HMO | Admitting: Physical Medicine & Rehabilitation

## 2023-01-02 ENCOUNTER — Encounter: Payer: Commercial Managed Care - HMO | Attending: Physical Medicine & Rehabilitation | Admitting: Registered Nurse

## 2023-01-02 ENCOUNTER — Encounter: Payer: Self-pay | Admitting: Registered Nurse

## 2023-01-02 VITALS — BP 101/70 | HR 62 | Ht 64.0 in | Wt 239.0 lb

## 2023-01-02 DIAGNOSIS — M25551 Pain in right hip: Secondary | ICD-10-CM | POA: Insufficient documentation

## 2023-01-02 DIAGNOSIS — G8929 Other chronic pain: Secondary | ICD-10-CM

## 2023-01-02 DIAGNOSIS — W19XXXD Unspecified fall, subsequent encounter: Secondary | ICD-10-CM | POA: Diagnosis not present

## 2023-01-02 DIAGNOSIS — G894 Chronic pain syndrome: Secondary | ICD-10-CM | POA: Diagnosis present

## 2023-01-02 DIAGNOSIS — Z5181 Encounter for therapeutic drug level monitoring: Secondary | ICD-10-CM | POA: Diagnosis present

## 2023-01-02 DIAGNOSIS — M25552 Pain in left hip: Secondary | ICD-10-CM | POA: Insufficient documentation

## 2023-01-02 DIAGNOSIS — M797 Fibromyalgia: Secondary | ICD-10-CM

## 2023-01-02 DIAGNOSIS — M545 Low back pain, unspecified: Secondary | ICD-10-CM | POA: Diagnosis not present

## 2023-01-02 DIAGNOSIS — M542 Cervicalgia: Secondary | ICD-10-CM | POA: Diagnosis not present

## 2023-01-02 DIAGNOSIS — M170 Bilateral primary osteoarthritis of knee: Secondary | ICD-10-CM | POA: Diagnosis not present

## 2023-01-02 DIAGNOSIS — Y92009 Unspecified place in unspecified non-institutional (private) residence as the place of occurrence of the external cause: Secondary | ICD-10-CM | POA: Insufficient documentation

## 2023-01-02 DIAGNOSIS — Z79891 Long term (current) use of opiate analgesic: Secondary | ICD-10-CM | POA: Diagnosis present

## 2023-01-02 MED ORDER — OXYCODONE-ACETAMINOPHEN 7.5-325 MG PO TABS: 1.0000 | ORAL_TABLET | Freq: Four times a day (QID) | ORAL | 0 refills | Status: DC | PRN

## 2023-01-02 NOTE — Progress Notes (Signed)
Subjective:    Patient ID: Molly Berry, female    DOB: 10-08-1965, 57 y.o.   MRN: [id number]  HPI: Molly Berry is a 57 y.o. female who returns for follow up appointment for chronic pain and medication refill. She states her pain is located in her neck, lower back pain bilateral hips and bilateral knee pain L>R. She rates her pain 7. Her current exercise regime is walking and performing stretching  exercises.  Molly Berry reports two weeks ago she had tripped over her bed steps and landed on her left side. Her husband helped her up she didn't seek medical attention. Educated on falls prevention, she verbalizes understanding.   Molly Berry Morphine equivalent is 45.00 MME. She  is also prescribed Alprazolam  by Dr. Evelene Croon .We have discussed the black box warning of using opioids and benzodiazepines. I highlighted the dangers of using these drugs together and discussed the adverse events including respiratory suppression, overdose, cognitive impairment and importance of compliance with current regimen. We will continue to monitor and adjust as indicated.  she is being closely monitored and under the care of her psychiatrist.   UDS ordered today.     Pain Inventory Average Pain 5 Pain Right Now 7 My pain is sharp and aching  In the last 24 hours, has pain interfered with the following? General activity 7 Relation with others 8 Enjoyment of life 8 What TIME of day is your pain at its worst? morning  Sleep (in general) Good  Pain is worse with: walking, bending, and standing Pain improves with: rest, heat/ice, medication, and injections Relief from Meds: 7  Family History  Problem Relation Age of Onset   Pneumonia Mother        Deceased   Arthritis Mother    Asthma Mother    Cancer Mother        pancreatic   COPD Mother    Depression Mother    Diabetes Mother    Kidney disease Mother    Liver cancer Father        Living   Arthritis Father    Cancer Father        liver,  prostate   COPD Father    Depression Father    Stroke Father    Heart disease Maternal Grandmother    Mental illness Maternal Grandmother    Breast cancer Maternal Grandmother    Alcohol abuse Paternal Grandfather    Breast cancer Paternal Aunt    Anesthesia problems Neg Hx    Hypotension Neg Hx    Malignant hyperthermia Neg Hx    Pseudochol deficiency Neg Hx    Colon cancer Neg Hx    Social History   Socioeconomic History   Marital status: Married    Spouse name: Fayrene Fearing "Rusty"   Number of children: Not on file   Years of education: 16   Highest education level: Not on file  Occupational History   Occupation: Curator employed)    Employer: NOT EMPLOYED  Tobacco Use   Smoking status: Never   Smokeless tobacco: Never  Vaping Use   Vaping status: Never Used  Substance and Sexual Activity   Alcohol use: No    Alcohol/week: 0.0 standard drinks of alcohol   Drug use: No   Sexual activity: Yes    Birth control/protection: None, Surgical  Other Topics Concern   Not on file  Social History Narrative   Lives with husband and 2 sons 19 and 15. (adopted)  Wants to work but says she is not dependable.  She does do volunteer work.    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  Working on disability.     Education: bachelors degree.   Disabled because of fibromyalgia and depression since 2010   Social Determinants of Health   Financial Resource Strain: Low Risk  (06/20/2022)   Received from [facility], [facility]   Overall Financial Resource Strain (CARDIA)    Difficulty of Paying Living Expenses: Not hard at all  Food Insecurity: No Food Insecurity (06/20/2022)   Received from [facility], [facility]   Hunger Vital Sign    Worried About Running Out of Food in the Last Year: Never true    Ran Out of Food in the Last Year: Never true  Transportation Needs: No Transportation Needs (06/20/2022)   Received from [facility], [facility]   PRAPARE -  Transportation    Lack of Transportation (Medical): No    Lack of Transportation (Non-Medical): No  Physical Activity: Inactive (03/19/2021)   Received from [facility], [facility]   Exercise Vital Sign    Days of Exercise per Week: 0 days    Minutes of Exercise per Session: 0 min  Stress: Stress Concern Present (03/19/2021)   Received from [facility], [facility] of Occupational Health - Occupational Stress Questionnaire    Feeling of Stress : Very much  Social Connections: Unknown (09/15/2021)   Received from [facility], [facility]   Social Network    Social Network: Not on file   Past Surgical History:  Procedure Laterality Date   ABDOMINAL HYSTERECTOMY     endometrial cancer   CHOLECYSTECTOMY     COLONOSCOPY WITH PROPOFOL N/A 01/19/2014   [id number]   CYSTOSCOPY W/ URETERAL STENT PLACEMENT Bilateral 02/07/2022   Procedure: CYSTOSCOPY WITH RETROGRADE PYELOGRAM/RIGHT URETERAL STENT PLACEMENT WITH POSSIBLE LEFT;  Surgeon: Jannifer Hick, MD;  Location: [facility];  Service: Urology;  Laterality: Bilateral;   CYSTOSCOPY WITH RETROGRADE PYELOGRAM, URETEROSCOPY AND STENT PLACEMENT Bilateral 03/06/2022   Procedure: CYSTOSCOPY WITH RETROGRADE PYELOGRAM, URETEROSCOPY AND STENT EXCHANGE;  Surgeon: Malen Gauze, MD;  Location: [facility];  Service: Urology;  Laterality: Bilateral;   DILATION AND CURETTAGE OF UTERUS  12 yrs ago   DILATION AND CURETTAGE OF UTERUS  06/18/2011   Procedure: DILATATION AND CURETTAGE;  Surgeon: Lazaro Arms, MD;  Location: [facility];  Service: Gynecology;  Laterality: N/A;  Suction Dilation and Curettage   ESOPHAGOGASTRODUODENOSCOPY N/A 10/12/2013   Dr. Rourk:anastomotic ulcer likely cause of bleeding. likely ischemic    ESOPHAGOGASTRODUODENOSCOPY (EGD) WITH PROPOFOL N/A 01/19/2014   [id number]   EXTRACORPOREAL SHOCK WAVE LITHOTRIPSY Left 01/11/2018   Procedure: LEFT EXTRACORPOREAL SHOCK WAVE LITHOTRIPSY (ESWL);  Surgeon:  Crista Elliot, MD;  [facility]: [facility];  Service: Urology;  Laterality: Left;   EXTRACORPOREAL SHOCK WAVE LITHOTRIPSY Left 01/14/2018   Procedure: LEFT EXTRACORPOREAL SHOCK WAVE LITHOTRIPSY (ESWL);  Surgeon: Malen Gauze, MD;  Location: [facility];  Service: Urology;  Laterality: Left;  75 MINS  W/ MAC   GASTRIC BYPASS  2013   [facility]   HOLMIUM LASER APPLICATION Bilateral 03/06/2022   Procedure: HOLMIUM LASER APPLICATION;  Surgeon: Malen Gauze, MD;  Location: [facility];  Service: Urology;  Laterality: Bilateral;   HYSTEROSCOPY WITH D & C  06/18/2011   Procedure: DILATATION AND CURETTAGE /HYSTEROSCOPY;  Surgeon: Lazaro Arms, MD;  [facility]: [facility];  Service: Gynecology;  Laterality: N/A;   LITHOTRIPSY     paniculectomy     SHOULDER ARTHROSCOPY WITH SUBACROMIAL DECOMPRESSION AND OPEN ROTATOR C Left 07/16/2021   Procedure: LEFT ARTHROSCOPIC SUBACROMIAL DECOMPRESSION, MINI OPEN ROTATOR CUFF TEAR REPAIR, BICEPS TENOTOMY;  Surgeon: Valeria Batman, MD;  [facility];  Service: Orthopedics;  Laterality: Left;   STONE EXTRACTION WITH BASKET Bilateral 03/06/2022   Procedure: STONE EXTRACTION WITH BASKET;  Surgeon: Malen Gauze, MD;  [facility];  Service: Urology;  Laterality: Bilateral;   TRIGGER FINGER RELEASE     WISDOM TOOTH EXTRACTION     Past Surgical History:  Procedure Laterality Date   ABDOMINAL HYSTERECTOMY     endometrial cancer   CHOLECYSTECTOMY     COLONOSCOPY WITH PROPOFOL N/A 01/19/2014   [id number]   CYSTOSCOPY W/ URETERAL STENT PLACEMENT Bilateral 02/07/2022   Procedure: CYSTOSCOPY WITH RETROGRADE PYELOGRAM/RIGHT URETERAL STENT PLACEMENT WITH POSSIBLE LEFT;  Surgeon: Jannifer Hick, MD;  [facility];  Service: Urology;  Laterality: Bilateral;   CYSTOSCOPY WITH RETROGRADE PYELOGRAM, URETEROSCOPY AND STENT PLACEMENT Bilateral 03/06/2022   Procedure: CYSTOSCOPY WITH RETROGRADE PYELOGRAM, URETEROSCOPY AND STENT EXCHANGE;  Surgeon: Malen Gauze, MD;  [facility];  Service: Urology;  Laterality: Bilateral;   DILATION AND CURETTAGE OF UTERUS  12 yrs ago   DILATION AND CURETTAGE OF UTERUS  06/18/2011   Procedure: DILATATION AND CURETTAGE;  Surgeon: Lazaro Arms, MD;  [facility]: [facility];  Service: Gynecology;  Laterality: N/A;  Suction Dilation and Curettage   ESOPHAGOGASTRODUODENOSCOPY N/A 10/12/2013   Dr. Rourk:anastomotic ulcer likely cause of bleeding. likely ischemic    ESOPHAGOGASTRODUODENOSCOPY (EGD) WITH PROPOFOL N/A 01/19/2014   [id number]   EXTRACORPOREAL SHOCK WAVE LITHOTRIPSY Left 01/11/2018   Procedure: LEFT EXTRACORPOREAL SHOCK WAVE LITHOTRIPSY (ESWL);  Surgeon: Crista Elliot, MD;  [facility];  Service: Urology;  Laterality: Left;   EXTRACORPOREAL SHOCK WAVE LITHOTRIPSY Left 01/14/2018   Procedure: LEFT EXTRACORPOREAL SHOCK WAVE LITHOTRIPSY (ESWL);  Surgeon: Malen Gauze, MD;  [facility];  Service: Urology;  Laterality: Left;  75 MINS  W/ MAC   GASTRIC BYPASS  2013   [facility]   HOLMIUM LASER APPLICATION Bilateral 03/06/2022   Procedure: HOLMIUM LASER APPLICATION;  Surgeon: Malen Gauze, MD;  [facility];  Service: Urology;  Laterality: Bilateral;   HYSTEROSCOPY WITH D & C  06/18/2011   Procedure: DILATATION AND CURETTAGE /HYSTEROSCOPY;  Surgeon: Lazaro Arms, MD;  [facility];  Service: Gynecology;  Laterality: N/A;   LITHOTRIPSY     paniculectomy     SHOULDER ARTHROSCOPY WITH SUBACROMIAL DECOMPRESSION AND OPEN ROTATOR C Left 07/16/2021   Procedure: LEFT ARTHROSCOPIC SUBACROMIAL DECOMPRESSION, MINI OPEN ROTATOR CUFF TEAR REPAIR, BICEPS TENOTOMY;  Surgeon: Valeria Batman, MD;  [facility];  Service: Orthopedics;  Laterality: Left;   STONE EXTRACTION WITH BASKET Bilateral 03/06/2022   Procedure: STONE EXTRACTION WITH BASKET;  Surgeon: Malen Gauze, MD;  [facility];  Service: Urology;  Laterality: Bilateral;   TRIGGER FINGER RELEASE     WISDOM TOOTH  EXTRACTION     Past Medical History:  Diagnosis Date   Allergy    Anemia    Anxiety    Arthritis    knees, multiple joints   Asthma    Bell palsy    Blood transfusion without reported diagnosis    Breast mass    lt breast mass x's 3 years increased in size   Chronic kidney disease  Chronic pain syndrome    COPD (chronic obstructive pulmonary disease) (HCC)    Depression    Diabetes mellitus    Endometrial cancer (HCC)    Fibromyalgia    GERD (gastroesophageal reflux disease)    H/O gastric bypass 05/09/2014   At [facility]   Headache    mirgraines   Hyperlipidemia    Hypertension    Hypothyroidism    Multiple pulmonary nodules 11/30/2012   Followed in Pulmonary clinic/ [facility]/ [place]  - See CT abd  10/29/12  New right lower lobe pulmonary nodularity, primarily ground-  glass in density. This could reflect an inflammatory process,  although follow-up is necessary to exclude atypical neoplasm. Full  chest CT should be considered to evaluate for other pulmonary  findings.      Multiple thyroid nodules    Nephropathy    Pneumonia    PONV (postoperative nausea and vomiting)    Pulmonary nodules    Thyroid disease    Ulcer    Uterine cancer (HCC) 12/26/2011   Stage 1, grade 1, S/P hysterectomy initially by robotic technique and then salpingo-oophorectomy at [facility] receiving no postoperative treatment.   BP 101/70   Pulse 62   Ht 5\' 4"  (1.626 m)   Wt 239 lb (108.4 kg)   LMP 05/06/2011   SpO2 99%   BMI 41.02 kg/m   Opioid Risk Score:   Fall Risk Score:  `1  Depression screen PHQ 2/9     01/02/2023    1:45 PM 09/15/2022   11:06 AM 08/18/2022    2:11 PM 10/30/2016    4:49 PM 07/07/2016    2:11 PM 03/09/2015    3:28 PM 05/25/2014    2:05 PM  Depression screen PHQ 2/9  Decreased Interest 1 2 2  0 0 0 0  Down, Depressed, Hopeless 1 2 2  0 0 0 0  PHQ - 2 Score 2 4 4  0 0 0 0  Altered sleeping   3      Tired, decreased energy   2      Change in appetite   2       Feeling bad or failure about yourself    2      Trouble concentrating   2      Moving slowly or fidgety/restless   0      Suicidal thoughts   0      PHQ-9 Score   15      Difficult doing work/chores   Extremely dIfficult          Review of Systems  Musculoskeletal:  Positive for back pain and neck pain.       B/L knee hip hand shoulder  All other systems reviewed and are negative.      Objective:   Physical Exam Vitals and nursing note reviewed.  Constitutional:      Appearance: Normal appearance.  Cardiovascular:     Rate and Rhythm: Normal rate and regular rhythm.     Pulses: Normal pulses.     Heart sounds: Normal heart sounds.  Pulmonary:     Effort: Pulmonary effort is normal.     Breath sounds: Normal breath sounds.  Musculoskeletal:     Cervical back: Normal range of motion and neck supple.     Comments: Normal Muscle Bulk and Muscle Testing Reveals:  Upper Extremities: Full ROM and Muscle Strength 5/5  Lumbar Paraspinal Tenderness: L-4-L-5 Lower Extremities: Right: Full ROM and Muscle Strength 5/5 Left Lower Extremity:  Decreased ROM and Muscle Strength 5/5 Arises from Table with ease Narrow Based Gait     Skin:    General: Skin is warm and dry.  Neurological:     Mental Status: She is alert and oriented to person, place, and time.  Psychiatric:        Mood and Affect: Mood normal.        Behavior: Behavior normal.           Assessment & Plan:  Cervicalgia: Continue HEP as Tolerated. Continue to Monitor.  Chronic Bilateral Low Back Pain without Sciatica: Continue HEP as tolerated. Continue to Monitor.  Fibromyalgia Syndrome: Continue HEP as tolerated. Continue to Monitor.  Primary Osteoarthritis of both knees: Continue HEP as Tolerated. Continue to Monitor.  Chronic Pain Syndrome: Refilled: Oxycodone 7.5/325 mg one tablet every 6 hours as needed for pain #120. We will continue the opioid monitoring program, this consists of regular clinic visits,  examinations, urine drug screen, pill counts as well as use of [place] Controlled Substance Reporting system. A 12 month History has been reviewed on the [place] Controlled Substance Reporting System on 01/02/2023. Fall at home: Educated on [employer], she verbalizes understanding.   F/U with Dr Natale Lay

## 2023-01-07 ENCOUNTER — Ambulatory Visit (INDEPENDENT_AMBULATORY_CARE_PROVIDER_SITE_OTHER): Payer: Commercial Managed Care - HMO | Admitting: Physician Assistant

## 2023-01-07 ENCOUNTER — Encounter: Payer: Self-pay | Admitting: Physician Assistant

## 2023-01-07 VITALS — Ht 66.0 in | Wt 237.0 lb

## 2023-01-07 DIAGNOSIS — M170 Bilateral primary osteoarthritis of knee: Secondary | ICD-10-CM

## 2023-01-07 MED ORDER — BUPIVACAINE HCL 0.25 % IJ SOLN
2.0000 mL | INTRAMUSCULAR | Status: AC | PRN
  Administered 2023-01-07: 2 mL via INTRA_ARTICULAR

## 2023-01-07 MED ORDER — METHYLPREDNISOLONE ACETATE 40 MG/ML IJ SUSP
40.0000 mg | INTRAMUSCULAR | Status: AC | PRN
  Administered 2023-01-07: 40 mg via INTRA_ARTICULAR

## 2023-01-07 MED ORDER — LIDOCAINE HCL 1 % IJ SOLN
2.0000 mL | INTRAMUSCULAR | Status: AC | PRN
  Administered 2023-01-07: 2 mL

## 2023-01-07 NOTE — Progress Notes (Signed)
Office Visit Note   Patient: Molly Berry           Date of Birth: November 10, 1965           MRN: [id number] Visit Date: 01/07/2023              Requested by: Ladora Daniel, PA-C [address] [place],  [place] [address] PCP: Ladora Daniel, PA-C  Chief Complaint  Patient presents with   Right Knee - Pain    Patient is having pain in right knee and wanting injecton with possible gel injecton and also want TKA   Left Knee - Pain    Patient is having pain in left knee and would like an injection and also look into gel injections  she is also interested in TAK      HPI: Molly Berry is a pleasant 57 year old woman who was a former patient of Dr. Cleophas Dunker.  She has bilateral osteoarthritis of her knees.  Left is worse than right.  She has been working on weight loss to get her BMI down to an acceptable number so she can go forward with knee replacement.  Her left knee is more problematic than her right.  She comes in today requesting injections.  Assessment & Plan: Visit Diagnoses:  1. Primary osteoarthritis of both knees     Plan: Went forward with injections today she understands she cannot have knee replacement within 3 months of these injections.  She is fine with that because she would like to continue to lose weight.  She will follow-up with Dr. Magnus Ivan to discuss knee replacement in about 5 weeks.  She is concerned about pain control as she is under chronic pain management.  Follow-Up Instructions: No follow-ups on file.   Ortho Exam  Patient is alert, oriented, no adenopathy, well-dressed, normal affect, normal respiratory effort. Bilateral knees no effusion no erythema compartments are soft and nontender she is neurovascular intact  Imaging: No results found. No images are attached to the encounter.  Labs: Lab Results  Component Value Date   HGBA1C 6.4 (H) 02/07/2022   HGBA1C 6.6 (H) 07/12/2021   HGBA1C 6.8 (A) 03/06/2015   LABURIC 5.7 03/06/2015   REPTSTATUS 02/24/2022  FINAL 02/19/2022   CULT  02/19/2022    NO GROWTH 5 DAYS Performed at [facility], [address] [place], [place] [address]    [facility] ESCHERICHIA COLI 02/06/2022     Lab Results  Component Value Date   ALBUMIN 3.2 (L) 02/20/2022   ALBUMIN 3.7 02/19/2022   ALBUMIN 3.9 02/06/2022    Lab Results  Component Value Date   MG 2.2 02/21/2022   MG 2.3 02/20/2022   MG 2.6 (H) 02/09/2022   Lab Results  Component Value Date   VD25OH 23.2 (L) 10/31/2014   VD25OH 20.6 (L) 06/21/2014    No results found for: "PREALBUMIN"    Latest Ref Rng & Units 02/21/2022    4:58 AM 02/20/2022    4:30 AM 02/19/2022    9:17 AM  CBC EXTENDED  WBC 4.0 - 10.5 K/uL 5.8  5.8  12.7   RBC 3.87 - 5.11 MIL/uL 3.90  3.86  4.37   Hemoglobin 12.0 - 15.0 g/dL 75.6  43.3  29.5   HCT 36.0 - 46.0 % 35.3  36.0  38.7   Platelets 150 - 400 K/uL 364  331  417   NEUT# 1.7 - 7.7 K/uL   10.9   Lymph# 0.7 - 4.0 K/uL   0.9  Body mass index is 38.25 kg/m.  Orders:  No orders of the defined types were placed in this encounter.  No orders of the defined types were placed in this encounter.    Procedures: Large Joint Inj: bilateral knee on 01/07/2023 4:23 PM Indications: pain and diagnostic evaluation Details: 25 G 1.5 in needle, anteromedial approach  Arthrogram: No  Medications (Right): 2 mL lidocaine 1 %; 2 mL bupivacaine 0.25 %; 40 mg methylPREDNISolone acetate 40 MG/ML Medications (Left): 2 mL lidocaine 1 %; 2 mL bupivacaine 0.25 %; 40 mg methylPREDNISolone acetate 40 MG/ML Outcome: tolerated well, no immediate complications Procedure, treatment alternatives, risks and benefits explained, specific risks discussed. Consent was given by the patient.      Clinical Data: No additional findings.  ROS:  All other systems negative, except as noted in the HPI. Review of Systems  Objective: Vital Signs: Ht 5\' 6"  (1.676 m)   Wt 237 lb (107.5 kg)   LMP 05/06/2011   BMI 38.25 kg/m   Specialty  Comments:  No specialty comments available.  PMFS History: Patient Active Problem List   Diagnosis Date Noted   UTI (urinary tract infection) 02/19/2022   Elevated d-dimer 02/07/2022   Gram-negative bacteremia 02/07/2022   Acute pyelonephritis 02/07/2022   AKI (acute kidney injury) (HCC) 02/07/2022   Gram negative sepsis (HCC) 02/07/2022   Obesity, Class III, BMI 40-49.9 (morbid obesity) (HCC) 02/06/2022   Sepsis due to undetermined organism (HCC) 02/06/2022   Hypoxia 02/06/2022   Opioid dependence (HCC) 02/06/2022   Osteoarthritis of knees, bilateral 12/02/2021   Pain in right wrist 11/07/2021   Pain in left wrist 11/07/2021   Complete tear of left rotator cuff 07/23/2021   Bee sting allergy 07/07/2016   Attention deficit disorder (ADD) in adult 07/07/2016   Chronic insomnia 07/07/2016   Vitamin D deficiency 07/07/2016   Migraine syndrome 07/07/2016   Osteoarthritis of both knees 07/07/2016   Fibromyalgia syndrome 07/07/2016   S/P gastric bypass 07/07/2016   HLD (hyperlipidemia) 07/07/2016   Morbid obesity due to excess calories (HCC) 03/10/2015   Controlled type 2 diabetes mellitus without complication, without long-term current use of insulin (HCC) 03/09/2015   Nontoxic multinodular goiter 03/09/2015   Essential hypertension, benign 03/09/2015   H/O gastric bypass 05/09/2014   IDA (iron deficiency anemia) 12/27/2013   Ureteral calculus, left 11/17/2013   SOB (shortness of breath) 11/30/2012   Past Medical History:  Diagnosis Date   Allergy    Anemia    Anxiety    Arthritis    knees, multiple joints   Asthma    Bell palsy    Blood transfusion without reported diagnosis    Breast mass    lt breast mass x's 3 years increased in size   Chronic kidney disease    Chronic pain syndrome    COPD (chronic obstructive pulmonary disease) (HCC)    Depression    Diabetes mellitus    Endometrial cancer (HCC)    Fibromyalgia    GERD (gastroesophageal reflux disease)     H/O gastric bypass 05/09/2014   At [facility]   Headache    mirgraines   Hyperlipidemia    Hypertension    Hypothyroidism    Multiple pulmonary nodules 11/30/2012   Followed in Pulmonary clinic/ [facility]/ [place]  - See CT abd  10/29/12  New right lower lobe pulmonary nodularity, primarily ground-  glass in density. This could reflect an inflammatory process,  although follow-up is necessary to exclude atypical neoplasm. Full  chest CT should be considered to evaluate for other pulmonary  findings.      Multiple thyroid nodules    Nephropathy    Pneumonia    PONV (postoperative nausea and vomiting)    Pulmonary nodules    Thyroid disease    Ulcer    Uterine cancer (HCC) 12/26/2011   Stage 1, grade 1, S/P hysterectomy initially by robotic technique and then salpingo[facility]oophorectomy at [facility] receiving no postoperative treatment.    Family History  Problem Relation Age of Onset   Pneumonia Mother        Deceased   Arthritis Mother    Asthma Mother    Cancer Mother        pancreatic   COPD Mother    Depression Mother    Diabetes Mother    Kidney disease Mother    Liver cancer Father        Living   Arthritis Father    Cancer Father        liver, prostate   COPD Father    Depression Father    Stroke Father    Heart disease Maternal Grandmother    Mental illness Maternal Grandmother    Breast cancer Maternal Grandmother    Alcohol abuse Paternal Grandfather    Breast cancer Paternal Aunt    Anesthesia problems Neg Hx    Hypotension Neg Hx    Malignant hyperthermia Neg Hx    Pseudochol deficiency Neg Hx    Colon cancer Neg Hx     Past Surgical History:  Procedure Laterality Date   ABDOMINAL HYSTERECTOMY     endometrial cancer   CHOLECYSTECTOMY     COLONOSCOPY WITH PROPOFOL N/A 01/19/2014   [id number]   CYSTOSCOPY W/ URETERAL STENT PLACEMENT Bilateral 02/07/2022   Procedure: CYSTOSCOPY WITH RETROGRADE PYELOGRAM/RIGHT URETERAL STENT PLACEMENT WITH POSSIBLE LEFT;   Surgeon: Jannifer Hick, MD;  Location: [facility];  Service: Urology;  Laterality: Bilateral;   CYSTOSCOPY WITH RETROGRADE PYELOGRAM, URETEROSCOPY AND STENT PLACEMENT Bilateral 03/06/2022   Procedure: CYSTOSCOPY WITH RETROGRADE PYELOGRAM, URETEROSCOPY AND STENT EXCHANGE;  Surgeon: Malen Gauze, MD;  Location: [facility];  Service: Urology;  Laterality: Bilateral;   DILATION AND CURETTAGE OF UTERUS  12 yrs ago   DILATION AND CURETTAGE OF UTERUS  06/18/2011   Procedure: DILATATION AND CURETTAGE;  Surgeon: Lazaro Arms, MD;  Location: [facility];  Service: Gynecology;  Laterality: N/A;  Suction Dilation and Curettage   ESOPHAGOGASTRODUODENOSCOPY N/A 10/12/2013   Dr. Rourk:anastomotic ulcer likely cause of bleeding. likely ischemic    ESOPHAGOGASTRODUODENOSCOPY (EGD) WITH PROPOFOL N/A 01/19/2014   [id number]   EXTRACORPOREAL SHOCK WAVE LITHOTRIPSY Left 01/11/2018   Procedure: LEFT EXTRACORPOREAL SHOCK WAVE LITHOTRIPSY (ESWL);  Surgeon: Crista Elliot, MD;  Location: [facility];  Service: Urology;  Laterality: Left;   EXTRACORPOREAL SHOCK WAVE LITHOTRIPSY Left 01/14/2018   Procedure: LEFT EXTRACORPOREAL SHOCK WAVE LITHOTRIPSY (ESWL);  Surgeon: Malen Gauze, MD;  Location: [facility];  Service: Urology;  Laterality: Left;  75 MINS  W/ MAC   GASTRIC BYPASS  2013   [facility]   HOLMIUM LASER APPLICATION Bilateral 03/06/2022   Procedure: HOLMIUM LASER APPLICATION;  Surgeon: Malen Gauze, MD;  Location: [facility];  Service: Urology;  Laterality: Bilateral;   HYSTEROSCOPY WITH D & C  06/18/2011   Procedure: DILATATION AND CURETTAGE /HYSTEROSCOPY;  Surgeon: Lazaro Arms, MD;  Location: [facility];  Service: Gynecology;  Laterality: N/A;   LITHOTRIPSY     paniculectomy  SHOULDER ARTHROSCOPY WITH SUBACROMIAL DECOMPRESSION AND OPEN ROTATOR C Left 07/16/2021   Procedure: LEFT ARTHROSCOPIC SUBACROMIAL DECOMPRESSION, MINI OPEN ROTATOR CUFF TEAR REPAIR, BICEPS TENOTOMY;  Surgeon: Valeria Batman, MD;   Location: [facility];  Service: Orthopedics;  Laterality: Left;   STONE EXTRACTION WITH BASKET Bilateral 03/06/2022   Procedure: STONE EXTRACTION WITH BASKET;  Surgeon: Malen Gauze, MD;  Location: [facility];  Service: Urology;  Laterality: Bilateral;   TRIGGER FINGER RELEASE     WISDOM TOOTH EXTRACTION     Social History   Occupational History   Occupation: Curator employed)    Employer: NOT EMPLOYED  Tobacco Use   Smoking status: Never   Smokeless tobacco: Never  Vaping Use   Vaping status: Never Used  Substance and Sexual Activity   Alcohol use: No    Alcohol/week: 0.0 standard drinks of alcohol   Drug use: No   Sexual activity: Yes    Birth control/protection: None, Surgical

## 2023-01-09 LAB — TOXASSURE SELECT,+ANTIDEPR,UR

## 2023-01-14 ENCOUNTER — Ambulatory Visit: Payer: Commercial Managed Care - HMO | Admitting: Urology

## 2023-01-23 ENCOUNTER — Ambulatory Visit: Payer: Commercial Managed Care - HMO | Admitting: Urology

## 2023-01-26 ENCOUNTER — Ambulatory Visit: Payer: Commercial Managed Care - HMO | Admitting: Urology

## 2023-01-26 VITALS — BP 92/60 | HR 65

## 2023-01-26 DIAGNOSIS — Z09 Encounter for follow-up examination after completed treatment for conditions other than malignant neoplasm: Secondary | ICD-10-CM

## 2023-01-26 DIAGNOSIS — Z87442 Personal history of urinary calculi: Secondary | ICD-10-CM | POA: Diagnosis not present

## 2023-01-26 DIAGNOSIS — N200 Calculus of kidney: Secondary | ICD-10-CM

## 2023-01-26 LAB — URINALYSIS, ROUTINE W REFLEX MICROSCOPIC
Bilirubin, UA: NEGATIVE
Glucose, UA: NEGATIVE
Ketones, UA: NEGATIVE
Nitrite, UA: NEGATIVE
Protein,UA: NEGATIVE
RBC, UA: NEGATIVE
Specific Gravity, UA: 1.03 (ref 1.005–1.030)
Urobilinogen, Ur: 1 mg/dL (ref 0.2–1.0)
pH, UA: 5.5 (ref 5.0–7.5)

## 2023-01-26 LAB — MICROSCOPIC EXAMINATION: Bacteria, UA: NONE SEEN

## 2023-01-26 NOTE — Progress Notes (Unsigned)
01/26/2023 11:44 AM   Molly Berry 1966/02/14 [id number]  Referring provider: Ladora Daniel, PA-C [address] [place],  [place] [address]  No chief complaint on file.   HPI:  Renal US shows no calculi. She drinks 36-48oz of water daily. 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 This could reflect an inflammatory process,  although follow-up is necessary to exclude atypical neoplasm. Full  chest CT should be considered to evaluate for other pulmonary  findings.      Multiple thyroid nodules    Nephropathy    Pneumonia    PONV (postoperative nausea and vomiting)    Pulmonary nodules    Thyroid disease    Ulcer    Uterine cancer (HCC) 12/26/2011   Stage 1, grade 1, S/P hysterectomy initially by robotic technique and then salpingo-oophorectomy at [facility] receiving no postoperative treatment.    Surgical History: Past Surgical History:  Procedure Laterality Date   ABDOMINAL HYSTERECTOMY     endometrial cancer   CHOLECYSTECTOMY     COLONOSCOPY WITH PROPOFOL N/A 01/19/2014   [id number]   CYSTOSCOPY W/ URETERAL STENT PLACEMENT Bilateral 02/07/2022   Procedure: CYSTOSCOPY WITH RETROGRADE  PYELOGRAM/RIGHT URETERAL STENT PLACEMENT WITH POSSIBLE LEFT;  Surgeon: Jannifer Hick, MD;  Location: [facility];  Service: Urology;  Laterality: Bilateral;   CYSTOSCOPY WITH RETROGRADE PYELOGRAM, URETEROSCOPY AND STENT PLACEMENT Bilateral 03/06/2022   Procedure: CYSTOSCOPY WITH RETROGRADE PYELOGRAM, URETEROSCOPY AND STENT EXCHANGE;  Surgeon: Malen Gauze, MD;  Location: [facility];  Service: Urology;  Laterality: Bilateral;   DILATION AND CURETTAGE OF UTERUS  12 yrs ago   DILATION AND CURETTAGE OF UTERUS  06/18/2011   Procedure: DILATATION AND CURETTAGE;  Surgeon: Lazaro Arms, MD;  Location: [facility];  Service: Gynecology;  Laterality: N/A;  Suction Dilation and Curettage   ESOPHAGOGASTRODUODENOSCOPY N/A 10/12/2013   Dr. Rourk:anastomotic ulcer likely cause of bleeding. likely ischemic    ESOPHAGOGASTRODUODENOSCOPY (EGD) WITH PROPOFOL N/A 01/19/2014   [id number]   EXTRACORPOREAL SHOCK WAVE LITHOTRIPSY Left 01/11/2018   Procedure: LEFT EXTRACORPOREAL SHOCK WAVE LITHOTRIPSY (ESWL);  Surgeon: Crista Elliot, MD;  Location: [facility];  Service: Urology;  Laterality: Left;   EXTRACORPOREAL SHOCK WAVE LITHOTRIPSY Left 01/14/2018   Procedure: LEFT EXTRACORPOREAL SHOCK WAVE LITHOTRIPSY (ESWL);  Surgeon: Malen Gauze, MD;  Location: [facility];  Service: Urology;  Laterality: Left;  75 MINS  W/ MAC   GASTRIC BYPASS  2013   [facility]   HOLMIUM LASER APPLICATION Bilateral 03/06/2022   Procedure:  HOLMIUM LASER APPLICATION;  Surgeon: Malen Gauze, MD;  Location: [facility];  Service: Urology;  Laterality: Bilateral;   HYSTEROSCOPY WITH D & C  06/18/2011   Procedure: DILATATION AND CURETTAGE /HYSTEROSCOPY;  Surgeon: Lazaro Arms, MD;  Location: [facility];  Service: Gynecology;  Laterality: N/A;   LITHOTRIPSY     paniculectomy     SHOULDER ARTHROSCOPY WITH SUBACROMIAL DECOMPRESSION AND OPEN ROTATOR C Left 07/16/2021   Procedure: LEFT ARTHROSCOPIC SUBACROMIAL DECOMPRESSION, MINI OPEN ROTATOR CUFF TEAR  REPAIR, BICEPS TENOTOMY;  Surgeon: Valeria Batman, MD;  Location: [facility];  Service: Orthopedics;  Laterality: Left;   STONE EXTRACTION WITH BASKET Bilateral 03/06/2022   Procedure: STONE EXTRACTION WITH BASKET;  Surgeon: Malen Gauze, MD;  Location: [facility];  Service: Urology;  Laterality: Bilateral;   TRIGGER FINGER RELEASE     WISDOM TOOTH EXTRACTION      Home Medications:  Allergies as of 01/26/2023       Reactions   Bee Venom Shortness Of Breath, Swelling, Rash   Cephalexin Anaphylaxis   Cephalexin Anaphylaxis, Itching, Swelling   Ciprofloxacin Hives, Rash, Swelling   hives hives hives   Penicillins Hives, Rash, Itching, Swelling   Has patient had a PCN reaction causing immediate rash, facial/tongue/throat swelling, SOB or lightheadedness with hypotension: Unknown Has patient had a PCN reaction causing severe rash involving mucus membranes or skin necrosis: Unknown Has patient had a PCN reaction that required hospitalization: Unknown Has patient had a PCN reaction occurring within the last 10 years: No If all of the above answers are "NO", then may proceed with Cephalosporin use. Has patient had a PCN reaction causing immediate rash, facial/tongue/throat swelling, SOB or lightheadedness with hypotension: Unknown Has patient had a PCN reaction causing severe rash involving mucus membranes or skin necrosis: Unknown Has patient had a PCN reaction that required hospitalization: Unknown Has patient had a PCN reaction occurring within the last 10 years: No If all of the above answers are "NO", then may proceed with Cephalosporin use.   Vancomycin Anaphylaxis, Hives, Itching   Has taken for bronchitis under medical supervision on site with benadryl Other reaction(s): Other (See Comments) Red man syndrome, gave benadryl to help, per RN.  Slow down infusion with future dose. Has taken for bronchitis under medical supervision on site with benadryl   Carbamazepine    Loss of balance,  trembling, inability to think, resulting in ER visit shortly after start of course  Other reaction(s): Other (See Comments) Speech, mind slows down, cant walk Loss of balance, trembling, inability to think, resulting in ER visit shortly after start of course   Cefotaxime Rash   Other reaction(s): Other (See Comments)   Sulfacetamide Sodium Rash   Doxycycline Diarrhea, Nausea And Vomiting   Adhesive [tape] Rash   Clindamycin Rash   Latex Rash   Sulfa Antibiotics Rash        Medication List        Accurate as of January 26, 2023 11:44 AM. If you have any questions, ask your nurse or doctor.          albuterol 108 (90 Base) MCG/ACT inhaler Commonly known as: VENTOLIN HFA Inhale 2 puffs into the lungs every 6 (six) hours as needed for shortness of breath or wheezing.   ALPRAZolam 1 MG tablet Commonly known as: XANAX Take 1 mg by mouth 3 (three) times daily as needed for anxiety or sleep.   buPROPion 150 MG 12 hr tablet Commonly known as: WELLBUTRIN SR  Take 150 mg by mouth 2 (two) times daily.   dicyclomine 20 MG tablet Commonly known as: BENTYL Take 20 mg by mouth 3 (three) times daily as needed for spasms.   diphenhydrAMINE 25 mg capsule Commonly known as: BENADRYL Take 25 mg by mouth at bedtime.   docusate sodium 100 MG capsule Commonly known as: COLACE Take 1 capsule (100 mg total) by mouth daily.   Dulaglutide 0.75 MG/0.5ML Sopn Inject into the skin.   esomeprazole 40 MG capsule Commonly known as: NEXIUM Take 40 mg by mouth 2 (two) times daily before a meal.   famotidine 40 MG tablet Commonly known as: PEPCID Take 40 mg by mouth at bedtime.   ferrous sulfate 325 (65 FE) MG tablet Take 1 tablet (325 mg total) by mouth daily with breakfast.   furosemide 20 MG tablet Commonly known as: LASIX Take 20 mg by mouth daily.   gabapentin 600 MG tablet Commonly known as: NEURONTIN Take 1,200 mg by mouth 2 (two) times daily.   indapamide 2.5 MG  tablet Commonly known as: LOZOL take 1 tablet by mouth once daily.   lamoTRIgine 200 MG tablet Commonly known as: LAMICTAL Take 200 mg by mouth 2 (two) times daily.   methocarbamol 750 MG tablet Commonly known as: ROBAXIN Take 750 mg by mouth 3 (three) times daily as needed for muscle spasms.   metoprolol succinate 50 MG 24 hr tablet Commonly known as: TOPROL-XL Take 50 mg by mouth daily.   nitrofurantoin (macrocrystal-monohydrate) 100 MG capsule Commonly known as: MACROBID Take 1 capsule (100 mg total) by mouth every 12 (twelve) hours.   ondansetron 8 MG tablet Commonly known as: ZOFRAN Take 1 tablet (8 mg total) by mouth every 8 (eight) hours as needed for nausea or vomiting.   oxyCODONE-acetaminophen 7.5-325 MG tablet Commonly known as: Percocet Take 1 tablet by mouth every 6 (six) hours as needed.   phenazopyridine 100 MG tablet Commonly known as: Pyridium Take 1 tablet (100 mg total) by mouth 3 (three) times daily as needed for pain.   simvastatin 20 MG tablet Commonly known as: ZOCOR Take 20 mg by mouth daily.   sucralfate 1 g tablet Commonly known as: CARAFATE Take 1 g by mouth 4 (four) times daily.   SUMAtriptan 100 MG tablet Commonly known as: IMITREX Take 100 mg by mouth every 2 (two) hours as needed for migraine.   tiZANidine 4 MG tablet Commonly known as: ZANAFLEX Take 4 mg by mouth at bedtime.   Vitamin D (Ergocalciferol) 1.25 MG (50000 UNIT) Caps capsule Commonly known as: DRISDOL Take 50,000 Units by mouth once a week.   vortioxetine HBr 20 MG Tabs tablet Commonly known as: TRINTELLIX Take 20 mg by mouth at bedtime.        Allergies:  Allergies  Allergen Reactions   Bee Venom Shortness Of Breath, Swelling and Rash   Cephalexin Anaphylaxis   Cephalexin Anaphylaxis, Itching and Swelling   Ciprofloxacin Hives, Rash and Swelling    hives hives  hives   Penicillins Hives, Rash, Itching and Swelling    Has patient had a PCN reaction  causing immediate rash, facial/tongue/throat swelling, SOB or lightheadedness with hypotension: Unknown Has patient had a PCN reaction causing severe rash involving mucus membranes or skin necrosis: Unknown Has patient had a PCN reaction that required hospitalization: Unknown Has patient had a PCN reaction occurring within the last 10 years: No If all of the above answers are "NO", then may proceed with Cephalosporin use. Has patient  had a PCN reaction causing immediate rash, facial/tongue/throat swelling, SOB or lightheadedness with hypotension: Unknown Has patient had a PCN reaction causing severe rash involving mucus membranes or skin necrosis: Unknown Has patient had a PCN reaction that required hospitalization: Unknown Has patient had a PCN reaction occurring within the last 10 years: No If all of the above answers are "NO", then may proceed with Cephalosporin use.   Vancomycin Anaphylaxis, Hives and Itching    Has taken for bronchitis under medical supervision on site with benadryl Other reaction(s): Other (See Comments) Red man syndrome, gave benadryl to help, per RN.  Slow down infusion with future dose.  Has taken for bronchitis under medical supervision on site with benadryl   Carbamazepine     Loss of balance, trembling, inability to think, resulting in ER visit shortly after start of course  Other reaction(s): Other (See Comments) Speech, mind slows down, cant walk  Loss of balance, trembling, inability to think, resulting in ER visit shortly after start of course   Cefotaxime Rash    Other reaction(s): Other (See Comments)   Sulfacetamide Sodium Rash   Doxycycline Diarrhea and Nausea And Vomiting   Adhesive [Tape] Rash   Clindamycin Rash   Latex Rash   Sulfa Antibiotics Rash    Family History: Family History  Problem Relation Age of Onset   Pneumonia Mother        Deceased   Arthritis Mother    Asthma Mother    Cancer Mother        pancreatic   COPD Mother     Depression Mother    Diabetes Mother    Kidney disease Mother    Liver cancer Father        Living   Arthritis Father    Cancer Father        liver, prostate   COPD Father    Depression Father    Stroke Father    Heart disease Maternal Grandmother    Mental illness Maternal Grandmother    Breast cancer Maternal Grandmother    Alcohol abuse Paternal Grandfather    Breast cancer Paternal Aunt    Anesthesia problems Neg Hx    Hypotension Neg Hx    Malignant hyperthermia Neg Hx    Pseudochol deficiency Neg Hx    Colon cancer Neg Hx     Social History:  reports that she has never smoked. She has never used smokeless tobacco. She reports that she does not drink alcohol and does not use drugs.  ROS: All other review of systems were reviewed and are negative except what is noted above in HPI  Physical Exam: BP 92/60   Pulse 65   LMP 05/06/2011   Constitutional:  Alert and oriented, No acute distress. HEENT: [place] AT, moist mucus membranes.  Trachea midline, no masses. Cardiovascular: No clubbing, cyanosis, or edema. Respiratory: Normal respiratory effort, no increased work of breathing. GI: Abdomen is soft, nontender, nondistended, no abdominal masses GU: No CVA tenderness.  Lymph: No cervical or inguinal lymphadenopathy. Skin: No rashes, bruises or suspicious lesions. Neurologic: Grossly intact, no focal deficits, moving all 4 extremities. Psychiatric: Normal mood and affect.  Laboratory Data: Lab Results  Component Value Date   WBC 5.8 02/21/2022   HGB 10.1 (L) 02/21/2022   HCT 35.3 (L) 02/21/2022   MCV 90.5 02/21/2022   PLT 364 02/21/2022    Lab Results  Component Value Date   CREATININE 0.87 02/21/2022    No results found for: "PSA"  No results found for: "TESTOSTERONE"  Lab Results  Component Value Date   HGBA1C 6.4 (H) 02/07/2022    Urinalysis    Component Value Date/Time   COLORURINE AMBER (A) 02/19/2022 0839   APPEARANCEUR Clear 06/27/2022 1226    LABSPEC 1.020 02/19/2022 0839   PHURINE 5.0 02/19/2022 0839   GLUCOSEU Negative 06/27/2022 1226   HGBUR MODERATE (A) 02/19/2022 0839   BILIRUBINUR Negative 06/27/2022 1226   KETONESUR NEGATIVE 02/19/2022 0839   PROTEINUR Negative 06/27/2022 1226   PROTEINUR 100 (A) 02/19/2022 0839   UROBILINOGEN 1.0 08/06/2021 0849   UROBILINOGEN 1.0 01/16/2015 0149   NITRITE Negative 06/27/2022 1226   NITRITE POSITIVE (A) 02/19/2022 0839   LEUKOCYTESUR Negative 06/27/2022 1226   LEUKOCYTESUR NEGATIVE 02/19/2022 0839    Lab Results  Component Value Date   LABMICR Comment 06/27/2022   WBCUA 0-5 03/12/2022   LABEPIT 0-10 03/12/2022   BACTERIA None seen 03/12/2022    Pertinent Imaging: *** Results for orders placed during the hospital encounter of 01/14/18  DG Abd 1 View  Narrative CLINICAL DATA:  Patient for lithotripsy today.  EXAM: ABDOMEN - 1 VIEW  COMPARISON:  CT abdomen and pelvis 12/30/2017.  KUB 01/11/2018.  FINDINGS: Although visualization is somewhat difficult due to gas and stool, stone within the left renal pelvis is unchanged. 2 small stones in the upper pole of the right kidney are also unchanged. No evidence of ureteral stone is identified. Bowel gas pattern is nonobstructive.  IMPRESSION: Stone in the left renal pelvis and 2 stones in the upper pole of the right kidney are unchanged. Visualization is limited due to extensive gas and stool.   Electronically Signed By: Drusilla Kanner M.D. On: 01/14/2018 13:47  No results found for this or any previous visit.  No results found for this or any previous visit.  No results found for this or any previous visit.  Results for orders placed during the hospital encounter of 12/25/22  Ultrasound renal complete  Narrative CLINICAL DATA:  Renal cortical thinning.  EXAM: RENAL / URINARY TRACT ULTRASOUND COMPLETE  COMPARISON:  Renal ultrasound 04/11/2022  FINDINGS: Right Kidney:  Renal measurements: 12.9 x  7.0 x 5.3 cm = volume: 248.8 mL. Renal cortical thinning. Normal cortical echogenicity. No hydronephrosis or mass.  Left Kidney:  Renal measurements: 11.9 x 6.1 x 5.6 cm = volume: 212.8 mL. Renal cortical thinning. Normal cortical echogenicity. No hydronephrosis or mass.  Bladder:  Appears normal for degree of bladder distention.  Other:  None.  IMPRESSION: 1. No hydronephrosis. 2. Bilateral renal cortical thinning.   Electronically Signed By: Annia Belt M.D. On: 12/25/2022 13:20  No valid procedures specified. No results found for this or any previous visit.  Results for orders placed during the hospital encounter of 02/06/22  CT RENAL STONE STUDY  Narrative CLINICAL DATA:  Right flank pain.  EXAM: CT ABDOMEN AND PELVIS WITHOUT CONTRAST  TECHNIQUE: Multidetector CT imaging of the abdomen and pelvis was performed following the standard protocol without IV contrast.  RADIATION DOSE REDUCTION: This exam was performed according to the departmental dose-optimization program which includes automated exposure control, adjustment of the mA and/or kV according to patient size and/or use of iterative reconstruction technique.  COMPARISON:  02/04/2022  FINDINGS: Lower chest: Bibasilar scarring and atelectasis. No pleural effusions. The heart is normal in size. No pericardial effusion.  Hepatobiliary: No hepatic lesions or intrahepatic biliary dilatation. The gallbladder is surgically absent. No common bile duct dilatation.  Pancreas: No mass, inflammation ductal  dilatation.  Spleen: Normal size.  No focal lesions.  Adrenals/Urinary Tract: The adrenal glands are normal.  New moderate right-sided hydronephrosis and hydroureter. There are 2 ureteral calculi. The more proximal calculus is at the L5 level and measures a maximum of or 6 mm. The more distal calculus is located at the mid sacral level and also measures 6 mm.  Bilateral renal calculi. There is a 6  mm calculus in the left extra renal pelvis but no obstructing left ureteral calculi. No bladder calculi. No worrisome renal or bladder lesions are identified without contrast.  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 The duodenum, small bowel and colon unremarkable.  Vascular/Lymphatic: Stable scattered atherosclerotic calcifications involving the aorta and iliac arteries but no aneurysm. No mesenteric or retroperitoneal lymphadenopathy. A retroaortic left renal vein is noted incidentally.  Reproductive: Surgically absent.  Other: Small amount of perinephric fluid on the right side also tracking down along the right ureter and into the pelvis.  Musculoskeletal: No significant bony findings.  IMPRESSION: 1. Two new 6 mm right ureteral calculi causing moderate right-sided hydronephrosis and hydroureter. 2. Bilateral renal calculi. 3. 6 mm calculus in the left extra renal pelvis but no obstructing left ureteral calculi. 4. 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 5. Status post cholecystectomy. No biliary dilatation. 6. Aortic atherosclerosis.  Aortic Atherosclerosis (ICD10-I70.0).   Electronically Signed By: Rudie Meyer M.D. On: 02/07/2022 10:13   Assessment & Plan:    1. Renal calculus *** - Urinalysis, Routine w reflex microscopic   No follow-ups on file.  Wilkie Aye, MD  [facility] [place]

## 2023-01-27 ENCOUNTER — Encounter: Payer: Self-pay | Admitting: Urology

## 2023-01-27 NOTE — Patient Instructions (Signed)

## 2023-02-03 DEATH — deceased

## 2023-02-04 ENCOUNTER — Encounter (HOSPITAL_COMMUNITY): Payer: Self-pay | Admitting: Hematology and Oncology

## 2023-02-11 ENCOUNTER — Other Ambulatory Visit (INDEPENDENT_AMBULATORY_CARE_PROVIDER_SITE_OTHER): Payer: Managed Care, Other (non HMO)

## 2023-02-11 ENCOUNTER — Ambulatory Visit: Payer: Managed Care, Other (non HMO) | Admitting: Orthopaedic Surgery

## 2023-02-11 ENCOUNTER — Other Ambulatory Visit (INDEPENDENT_AMBULATORY_CARE_PROVIDER_SITE_OTHER): Payer: Self-pay

## 2023-02-11 DIAGNOSIS — G8929 Other chronic pain: Secondary | ICD-10-CM | POA: Diagnosis not present

## 2023-02-11 DIAGNOSIS — M25561 Pain in right knee: Secondary | ICD-10-CM | POA: Diagnosis not present

## 2023-02-11 DIAGNOSIS — M25562 Pain in left knee: Secondary | ICD-10-CM

## 2023-02-11 NOTE — Progress Notes (Signed)
The patient is a 57 year old female that I am seeing for the first time.  She is a former patient of Dr. Cleophas Dunker it did see Janett Labella last month who provided steroid injections in both her knees.  She is someone who is in chronic pain management and is on Percocet 7.5 mg 4 times a day.  I was able to review all of her medications and past medical history within epic.  She cannot take anti-inflammatories due to kidney disease.  She is morbidly obese with a BMI of 38.25.  Her pain in both knees is daily and it is detrimentally fighting her mobility, her quality of life and actives daily living.  It has gotten worse over the last 10 years.  She has tried and failed all forms of conservative treatment.  Examination of both knees shows slight varus malalignment that is correctable.  She does have a large soft tissue envelope around both knees.  There is medial and lateral joint line tenderness and patellofemoral crepitation.  Both knees are ligamentously stable with good range of motion.  X-rays of both knees shows severe and stage arthritis with varus malalignment, bone-on-bone wear the medial compartment and patellofemoral joint and large osteophytes in all 3 compartments.  We had a long and thorough discussion about knee replacement surgery.  Her husband has had these so she understands what it involves.  We discussed the risks and benefits of surgery and what to expect from an intraoperative and postoperative course.  My biggest concern is the fact that she is in chronic pain management and trying to understand what we will be able to do to hopefully control her postoperative pain following the surgery.  I have given her a note to give to her pain management specialist asking for recommendations of what we can do knowing how difficult the surgery is for someone and how pain can be quite severe especially with someone who is in chronic pain management.  I do feel that she would benefit from the surgery  given the severity of her arthritis as that she.  We will work on getting this scheduled sometime and likely December given the fact that she just had steroid injections in both knees.

## 2023-02-23 ENCOUNTER — Encounter (HOSPITAL_COMMUNITY): Payer: Self-pay | Admitting: Hematology and Oncology

## 2023-03-02 ENCOUNTER — Encounter: Payer: Self-pay | Admitting: Physical Medicine & Rehabilitation

## 2023-03-02 ENCOUNTER — Encounter (HOSPITAL_COMMUNITY): Payer: Self-pay | Admitting: Hematology and Oncology

## 2023-03-02 ENCOUNTER — Encounter
Payer: Managed Care, Other (non HMO) | Attending: Physical Medicine & Rehabilitation | Admitting: Physical Medicine & Rehabilitation

## 2023-03-02 VITALS — BP 118/61 | HR 68 | Ht 66.0 in | Wt 237.0 lb

## 2023-03-02 DIAGNOSIS — M797 Fibromyalgia: Secondary | ICD-10-CM | POA: Insufficient documentation

## 2023-03-02 MED ORDER — OXYCODONE-ACETAMINOPHEN 7.5-325 MG PO TABS: 1.0000 | ORAL_TABLET | Freq: Four times a day (QID) | ORAL | 0 refills | Status: DC | PRN

## 2023-03-02 NOTE — Progress Notes (Signed)
Subjective:    Patient ID: Molly Berry, female    DOB: 13-Mar-1966, 57 y.o.   MRN: [id number]  HPI  HPI 08/18/22 Molly Berry is a 57 y.o. year old female  who  has a past medical history of Allergy, Anemia, Anxiety, Arthritis, Asthma, Bell palsy, Blood transfusion without reported diagnosis, Breast mass, Chronic kidney disease, Chronic pain syndrome, COPD (chronic obstructive pulmonary disease), Depression, Diabetes mellitus, Endometrial cancer, Fibromyalgia, GERD (gastroesophageal reflux disease), H/O gastric bypass (05/09/2014), Headache, Hyperlipidemia, Hypertension, Hypothyroidism, Multiple pulmonary nodules (11/30/2012), Multiple thyroid nodules, Nephropathy, Pneumonia, PONV (postoperative nausea and vomiting), Pulmonary nodules, Thyroid disease, Ulcer, and Uterine cancer (12/26/2011).   They are presenting to PM&R clinic as a new patient for pain management evaluation.  Molly Berry reports that most of her pain is related to her history of fibromyalgia and osteoarthritis particular of her knees.  She has widespread pain throughout her whole body from fibromyalgia.  She has had knee pain for several years and her knees are her worst area of pain. It was initially worse in her right knee, then her left knee became more severe.  She reports she had cortisone injections with slight benefit however she cannot get gel injections approved by insurance.  She would like to have knee surgery and is followed by orthopedics most recently seen by Legrand Como, PA.  She is working on weight loss in order to be able to qualify for surgery.  She was previously seen by [facility].  Her pain was previously managed with oxycodone 10 mg 3 times a day.  She reports she was with this practice for about 7 years.  She does report she was discharged after she took a tramadol by a family member gave her when she had had multiple procedures and surgeries for renal stones.  Since this time her PCP has been  prescribing her oxycodone 10 mg.     No red flags for back pain endorsed in Hx or ROS   Medications tried: Nsaids meloxicam helps, rare use due to concern for kidneys Tylenol - helps slightly  Opiates  oxycodone 10mg  helps, has used this for many years Gabapentin 1200mg  BID  Lyrica- reports reason she can't take it but doesn't remember what that was  Lamictal for mood  Buprenorphine - bad feeling  Tramadol didn't help  TCAs  - didn't help SNRIs  cymbalta, did not tolerate it  Other   Tizanidine/robaxin slightly helps    Other treatments: PT/OT  - helped in the past, she likes "integrative therapy" TENs unit - made her sore  Injections - knee cortisone   Interval history 09/15/2022 Patient also reports she has been doing well overall regarding her pain control.  Her pain has been severe in her right back and flank recently.  She reports follow-up with urology today in clinic, says she has had multiple issues with kidney stones.  She also had a UA ordered today.  She was started on Macrobid.  Prior to this her pain was much better controlled on Percocet 7.5 mg 4 times a day.  This was providing more consistent pain control than Percocet 10 mg 3 times a day.  Patient reports she got message from [facility] however needs to call them back.   Interval history 09/15/2022 Patient also reports she has been doing well overall regarding her pain control.  Her pain has been severe in her right back and flank recently.  She reports follow-up with urology today in clinic,  says she has had multiple issues with kidney stones.  She also had a UA ordered today.  She was started on Macrobid.  Prior to this her pain was much better controlled on Percocet 7.5 mg 4 times a day.  This was providing more consistent pain control than Percocet 10 mg 3 times a day.  Patient reports she got message from [facility] however needs to call them back.   Interval history 03/02/23 Molly Berry is here for follow-up regarding her  chronic pain.  She reports that pain continues to be poorly controlled.  Her worst pain is at her knees however she has pain all throughout her body.  Percocet 7.5 mg helps some days, other days her pain continues to be very severe.  No side effects with the medication.  She is planning on having knee replacement surgery with Dr. Magnus Ivan.  Pain Inventory Average Pain 7 Pain Right Now 9 My pain is sharp, burning, stabbing, and aching  In the last 24 hours, has pain interfered with the following? General activity 10 Relation with others 10 Enjoyment of life 10 What TIME of day is your pain at its worst? varies Sleep (in general) Poor  Pain is worse with: walking, bending, and standing Pain improves with: rest and medication Relief from Meds: 5  Family History  Problem Relation Age of Onset   Pneumonia Mother        Deceased   Arthritis Mother    Asthma Mother    Cancer Mother        pancreatic   COPD Mother    Depression Mother    Diabetes Mother    Kidney disease Mother    Liver cancer Father        Living   Arthritis Father    Cancer Father        liver, prostate   COPD Father    Depression Father    Stroke Father    Heart disease Maternal Grandmother    Mental illness Maternal Grandmother    Breast cancer Maternal Grandmother    Alcohol abuse Paternal Grandfather    Breast cancer Paternal Aunt    Anesthesia problems Neg Hx    Hypotension Neg Hx    Malignant hyperthermia Neg Hx    Pseudochol deficiency Neg Hx    Colon cancer Neg Hx    Social History   Socioeconomic History   Marital status: Married    Spouse name: Fayrene Fearing "Rusty"   Number of children: Not on file   Years of education: 16   Highest education level: Not on file  Occupational History   Occupation: Curator employed)    Employer: NOT EMPLOYED  Tobacco Use   Smoking status: Never   Smokeless tobacco: Never  Vaping Use   Vaping status: Never Used  Substance and Sexual Activity    Alcohol use: No    Alcohol/week: 0.0 standard drinks of alcohol   Drug use: No   Sexual activity: Yes    Birth control/protection: None, Surgical  Other Topics Concern   Not on file  Social History Narrative   Lives with husband and 2 sons 29 and 15. (adopted)    Wants to work but says she is not dependable.  She does do volunteer work.    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  Working on disability.     Education: bachelors degree.   Disabled because of fibromyalgia and depression since 2010   Social Determinants of Health  Financial Resource Strain: Low Risk  (06/20/2022)   Received from [facility], [facility]   Overall Financial Resource Strain (CARDIA)    Difficulty of Paying Living Expenses: Not hard at all  Food Insecurity: No Food Insecurity (06/20/2022)   Received from [facility], [facility]   Hunger Vital Sign    Worried About Running Out of Food in the Last Year: Never true    Ran Out of Food in the Last Year: Never true  Transportation Needs: No Transportation Needs (06/20/2022)   Received from [facility], [facility]   PRAPARE - Transportation    Lack of Transportation (Medical): No    Lack of Transportation (Non-Medical): No  Physical Activity: Inactive (03/19/2021)   Received from [facility], [facility]   Exercise Vital Sign    Days of Exercise per Week: 0 days    Minutes of Exercise per Session: 0 min  Stress: Stress Concern Present (03/19/2021)   Received from [facility], [facility] Occupational Health - Occupational Stress Questionnaire    Feeling of Stress : Very much  Social Connections: Unknown (09/15/2021)   Received from [facility], [facility]   Social Network    Social Network: Not on file   Past Surgical History:  Procedure Laterality Date   ABDOMINAL HYSTERECTOMY     endometrial cancer   CHOLECYSTECTOMY     COLONOSCOPY WITH PROPOFOL N/A 01/19/2014   [id number]   CYSTOSCOPY W/  URETERAL STENT PLACEMENT Bilateral 02/07/2022   Procedure: CYSTOSCOPY WITH RETROGRADE PYELOGRAM/RIGHT URETERAL STENT PLACEMENT WITH POSSIBLE LEFT;  Surgeon: Jannifer Hick, MD;  Location: [facility];  Service: Urology;  Laterality: Bilateral;   CYSTOSCOPY WITH RETROGRADE PYELOGRAM, URETEROSCOPY AND STENT PLACEMENT Bilateral 03/06/2022   Procedure: CYSTOSCOPY WITH RETROGRADE PYELOGRAM, URETEROSCOPY AND STENT EXCHANGE;  Surgeon: Malen Gauze, MD;  Location: [facility];  Service: Urology;  Laterality: Bilateral;   DILATION AND CURETTAGE OF UTERUS  12 yrs ago   DILATION AND CURETTAGE OF UTERUS  06/18/2011   Procedure: DILATATION AND CURETTAGE;  Surgeon: Lazaro Arms, MD;  Location: [facility];  Service: Gynecology;  Laterality: N/A;  Suction Dilation and Curettage   ESOPHAGOGASTRODUODENOSCOPY N/A 10/12/2013   Dr. Rourk:anastomotic ulcer likely cause of bleeding. likely ischemic    ESOPHAGOGASTRODUODENOSCOPY (EGD) WITH PROPOFOL N/A 01/19/2014   [id number]   EXTRACORPOREAL SHOCK WAVE LITHOTRIPSY Left 01/11/2018   Procedure: LEFT EXTRACORPOREAL SHOCK WAVE LITHOTRIPSY (ESWL);  Surgeon: Crista Elliot, MD;  Location: [facility];  Service: Urology;  Laterality: Left;   EXTRACORPOREAL SHOCK WAVE LITHOTRIPSY Left 01/14/2018   Procedure: LEFT EXTRACORPOREAL SHOCK WAVE LITHOTRIPSY (ESWL);  Surgeon: Malen Gauze, MD;  Location: [facility];  Service: Urology;  Laterality: Left;  75 MINS  W/ MAC   GASTRIC BYPASS  2013   [facility]   HOLMIUM LASER APPLICATION Bilateral 03/06/2022   Procedure: HOLMIUM LASER APPLICATION;  Surgeon: Malen Gauze, MD;  Location: [facility];  Service: Urology;  Laterality: Bilateral;   HYSTEROSCOPY WITH D & C  06/18/2011   Procedure: DILATATION AND CURETTAGE /HYSTEROSCOPY;  Surgeon: Lazaro Arms, MD;  Location: [facility];  Service: Gynecology;  Laterality: N/A;   LITHOTRIPSY     paniculectomy     SHOULDER ARTHROSCOPY WITH SUBACROMIAL DECOMPRESSION AND OPEN ROTATOR C Left 07/16/2021    Procedure: LEFT ARTHROSCOPIC SUBACROMIAL DECOMPRESSION, MINI OPEN ROTATOR CUFF TEAR REPAIR, BICEPS TENOTOMY;  Surgeon: Valeria Batman, MD;  [facility];  Service: Orthopedics;  Laterality: Left;  STONE EXTRACTION WITH BASKET Bilateral 03/06/2022   Procedure: STONE EXTRACTION WITH BASKET;  Surgeon: Malen Gauze, MD;  [facility]: [facility];  Service: Urology;  Laterality: Bilateral;   TRIGGER FINGER RELEASE     WISDOM TOOTH EXTRACTION     Past Surgical History:  Procedure Laterality Date   ABDOMINAL HYSTERECTOMY     endometrial cancer   CHOLECYSTECTOMY     COLONOSCOPY WITH PROPOFOL N/A 01/19/2014   [id number]   CYSTOSCOPY W/ URETERAL STENT PLACEMENT Bilateral 02/07/2022   Procedure: CYSTOSCOPY WITH RETROGRADE PYELOGRAM/RIGHT URETERAL STENT PLACEMENT WITH POSSIBLE LEFT;  Surgeon: Jannifer Hick, MD;  [facility];  Service: Urology;  Laterality: Bilateral;   CYSTOSCOPY WITH RETROGRADE PYELOGRAM, URETEROSCOPY AND STENT PLACEMENT Bilateral 03/06/2022   Procedure: CYSTOSCOPY WITH RETROGRADE PYELOGRAM, URETEROSCOPY AND STENT EXCHANGE;  Surgeon: Malen Gauze, MD;  [facility];  Service: Urology;  Laterality: Bilateral;   DILATION AND CURETTAGE OF UTERUS  12 yrs ago   DILATION AND CURETTAGE OF UTERUS  06/18/2011   Procedure: DILATATION AND CURETTAGE;  Surgeon: Lazaro Arms, MD;  [facility]: [facility];  Service: Gynecology;  Laterality: N/A;  Suction Dilation and Curettage   ESOPHAGOGASTRODUODENOSCOPY N/A 10/12/2013   Dr. Rourk:anastomotic ulcer likely cause of bleeding. likely ischemic    ESOPHAGOGASTRODUODENOSCOPY (EGD) WITH PROPOFOL N/A 01/19/2014   [id number]   EXTRACORPOREAL SHOCK WAVE LITHOTRIPSY Left 01/11/2018   Procedure: LEFT EXTRACORPOREAL SHOCK WAVE LITHOTRIPSY (ESWL);  Surgeon: Crista Elliot, MD;  [facility];  Service: Urology;  Laterality: Left;   EXTRACORPOREAL SHOCK WAVE LITHOTRIPSY Left 01/14/2018   Procedure: LEFT EXTRACORPOREAL SHOCK WAVE  LITHOTRIPSY (ESWL);  Surgeon: Malen Gauze, MD;  [facility];  Service: Urology;  Laterality: Left;  75 MINS  W/ MAC   GASTRIC BYPASS  2013   [facility]   HOLMIUM LASER APPLICATION Bilateral 03/06/2022   Procedure: HOLMIUM LASER APPLICATION;  Surgeon: Malen Gauze, MD;  [facility]: [facility];  Service: Urology;  Laterality: Bilateral;   HYSTEROSCOPY WITH D & C  06/18/2011   Procedure: DILATATION AND CURETTAGE /HYSTEROSCOPY;  Surgeon: Lazaro Arms, MD;  [facility];  Service: Gynecology;  Laterality: N/A;   LITHOTRIPSY     paniculectomy     SHOULDER ARTHROSCOPY WITH SUBACROMIAL DECOMPRESSION AND OPEN ROTATOR C Left 07/16/2021   Procedure: LEFT ARTHROSCOPIC SUBACROMIAL DECOMPRESSION, MINI OPEN ROTATOR CUFF TEAR REPAIR, BICEPS TENOTOMY;  Surgeon: Valeria Batman, MD;  [facility];  Service: Orthopedics;  Laterality: Left;   STONE EXTRACTION WITH BASKET Bilateral 03/06/2022   Procedure: STONE EXTRACTION WITH BASKET;  Surgeon: Malen Gauze, MD;  Location: [facility];  Service: Urology;  Laterality: Bilateral;   TRIGGER FINGER RELEASE     WISDOM TOOTH EXTRACTION     Past Medical History:  Diagnosis Date   Allergy    Anemia    Anxiety    Arthritis    knees, multiple joints   Asthma    Bell palsy    Blood transfusion without reported diagnosis    Breast mass    lt breast mass x's 3 years increased in size   Chronic kidney disease    Chronic pain syndrome    COPD (chronic obstructive pulmonary disease) (HCC)    Depression    Diabetes mellitus    Endometrial cancer (HCC)    Fibromyalgia    GERD (gastroesophageal reflux disease)    H/O gastric bypass 05/09/2014   At [facility]   Headache    mirgraines  Hyperlipidemia    Hypertension    Hypothyroidism    Multiple pulmonary nodules 11/30/2012   Followed in Pulmonary clinic/ [facility]/ [place]  - See CT abd  10/29/12  New right lower lobe pulmonary nodularity, primarily ground-  glass in density. This could  reflect an inflammatory process,  although follow-up is necessary to exclude atypical neoplasm. Full  chest CT should be considered to evaluate for other pulmonary  findings.      Multiple thyroid nodules    Nephropathy    Pneumonia    PONV (postoperative nausea and vomiting)    Pulmonary nodules    Thyroid disease    Ulcer    Uterine cancer (HCC) 12/26/2011   Stage 1, grade 1, S/P hysterectomy initially by robotic technique and then salpingo-oophorectomy at [facility] receiving no postoperative treatment.   BP 118/61   Pulse 68   Ht 5\' 6"  (1.676 m)   Wt 237 lb (107.5 kg)   LMP 05/06/2011   SpO2 98%   BMI 38.25 kg/m   Opioid Risk Score:   Fall Risk Score:  `1  Depression screen [facility] 2/9     03/02/2023   10:20 AM 01/02/2023    1:45 PM 09/15/2022   11:06 AM 08/18/2022    2:11 PM 10/30/2016    4:49 PM 07/07/2016    2:11 PM 03/09/2015    3:28 PM  Depression screen PHQ 2/9  Decreased Interest 0 1 2 2  0 0 0  Down, Depressed, Hopeless 0 1 2 2  0 0 0  PHQ - 2 Score 0 2 4 4  0 0 0  Altered sleeping    3     Tired, decreased energy    2     Change in appetite    2     Feeling bad or failure about yourself     2     Trouble concentrating    2     Moving slowly or fidgety/restless    0     Suicidal thoughts    0     PHQ-9 Score    15     Difficult doing work/chores    Extremely dIfficult         Review of Systems  Musculoskeletal:  Positive for back pain, gait problem and neck pain.       B/L arm, knee, foot pain  All other systems reviewed and are negative.     Objective:   Physical Exam   Gen: no distress, normal appearing HEENT: oral mucosa pink and moist, NCAT Chest: normal effort, normal rate of breathing Abd: soft, non-distended Ext: no edema Psych: Tearful at times Skin: intact Neuro: Alert and awake, follows commands, cranial nerves II through XII grossly intact DTR normal and symmetric No focal motor or sensory deficits noted No dysmetria or  ataxia Musculoskeletal: No knee erythema, warmth or effusion Bilateral knee joint line tenderness medial and lateral No ligamentous instability noted bilaterally She is tender to palpation lumbar and cervical paraspinal muscles, shoulders, periscapular muscles, elbows, greater trochanters, ankles Slightly decreased L-spine and C-spine ROM in all directions     Xray L knee result 03/10/22 "Three-view radiographs of the left knee were obtained today.  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  She has periarticular  osteophytes no acute fractures are noted "   Xray L knee result 12/02/21 " Radiographs of  her right knee in 3 projections were reviewed today.  She  has tricompartmental arthritis with most changes in the medial compartment  with near bone-on-bone.  Also patellofemoral sclerosis and osteophyte  formation no acute fractures "        Assessment & Plan:   Knee OA -Foods for pain list provided -Patient reports her pain is doing better with Percocet 7.5 mg 4 times a day.  -Continue pill counts -Pain agreement prior visit -Could consider genicular nerve block if she does not have surgery at expected time -She reports Dr. Magnus Ivan is considering TKA in December. -Will likely need increased dose of pain medication after her surgery, could consider  2 tabs of the Percocet 7.5 mg (15mg ) every 4 hours PRN.  Knee surgery may provide her long-term benefits, although she will will likely need higher doses of pain medication for postsurgical pain control due to her chronic use of Percocet.  Surgery may also cause a flare with her fibromyalgia. -ORT low    Right back and flank pain -Patient reports she has had prior pain in the past related to kidney stones, says she has follow-up with urology later today to review this further, appears she had a  UA ordered -Continue follow-up with urology for further evaluation   Fibromyalgia  -Continue tizanidine as needed -Continue gabapentin 1200 mg twice daily -New consult for aquatic therapy placed-she is limited by distance and cost because she lives in [place] -[place] device ordered, [employer] Cryoheat blanket ordered last visit.  She reports she needs to call the company back, number provided

## 2023-03-20 ENCOUNTER — Ambulatory Visit: Payer: Managed Care, Other (non HMO)

## 2023-03-31 ENCOUNTER — Other Ambulatory Visit: Payer: Self-pay

## 2023-03-31 ENCOUNTER — Encounter (HOSPITAL_COMMUNITY): Payer: Self-pay | Admitting: Hematology and Oncology

## 2023-04-06 ENCOUNTER — Encounter: Payer: Self-pay | Admitting: Urology

## 2023-04-06 ENCOUNTER — Ambulatory Visit: Payer: Commercial Managed Care - HMO | Admitting: Urology

## 2023-04-06 VITALS — BP 119/80 | HR 76 | Temp 98.0°F

## 2023-04-06 DIAGNOSIS — R399 Unspecified symptoms and signs involving the genitourinary system: Secondary | ICD-10-CM

## 2023-04-06 DIAGNOSIS — R8290 Unspecified abnormal findings in urine: Secondary | ICD-10-CM

## 2023-04-06 LAB — MICROSCOPIC EXAMINATION: WBC, UA: >30 /[HPF] — AB (ref 0–5)

## 2023-04-06 LAB — URINALYSIS, ROUTINE W REFLEX MICROSCOPIC
Bilirubin, UA: NEGATIVE
Glucose, UA: NEGATIVE
Ketones, UA: NEGATIVE
Nitrite, UA: POSITIVE — AB
Specific Gravity, UA: 1.025 (ref 1.005–1.030)
Urobilinogen, Ur: 1 mg/dL (ref 0.2–1.0)
pH, UA: 6 (ref 5.0–7.5)

## 2023-04-06 MED ORDER — NITROFURANTOIN MONOHYD MACRO 100 MG PO CAPS
100.0000 mg | ORAL_CAPSULE | Freq: Two times a day (BID) | ORAL | 0 refills | Status: AC
Start: 2023-04-06 — End: ?

## 2023-04-06 NOTE — Progress Notes (Signed)
Name: Molly Berry DOB: 11-Feb-1966 MRN: [id number]  History of Present Illness: Molly Berry is a 57 y.o. female who presents today for follow up visit at [facility] [place]. - GU History: 1. Kidney stones.  - Has had prior stone procedures including ESWL and URS. - 12/25/2022: RUS negative; no GU stones or hydronephrosis.  At last visit with Molly Berry on 01/26/2023: Doing well. Plan was f/u in 1 year.  Today: She reports recent stone passage about 5 days ago and now a suspected UTI. Within the last week she was having nausea, fatigue, bilateral low back pain (R > L), low abdominal pain, subjective fevers, increased urinary urgency, frequency, and dysuria. Denies gross hematuria - states she can't tell because she's been taking AZO (Pyridium). Has been taking Zofran for nausea PRN and her opioid analgesic PRN for pain (as prescribed elsewhere).    Medications: Current Outpatient Medications  Medication Sig Dispense Refill   Dextromethorphan-Bupropion (AUVELITY PO) Take by mouth.     oxyCODONE-acetaminophen (PERCOCET) 7.5-325 MG tablet Take 1 tablet by mouth every 6 (six) hours as needed. 120 tablet 0   phenazopyridine (PYRIDIUM) 100 MG tablet Take 1 tablet (100 mg total) by mouth 3 (three) times daily as needed for pain. 30 tablet 0   SUMAtriptan (IMITREX) 100 MG tablet Take 100 mg by mouth every 2 (two) hours as needed for migraine.     vortioxetine HBr (TRINTELLIX) 20 MG TABS tablet Take 20 mg by mouth at bedtime.     albuterol (PROVENTIL HFA;VENTOLIN HFA) 108 (90 BASE) MCG/ACT inhaler Inhale 2 puffs into the lungs every 6 (six) hours as needed for shortness of breath or wheezing.     ALPRAZolam (XANAX) 1 MG tablet Take 1 mg by mouth 3 (three) times daily as needed for anxiety or sleep.     dicyclomine (BENTYL) 20 MG tablet Take 20 mg by mouth 3 (three) times daily as needed for spasms.     diphenhydrAMINE (BENADRYL) 25 mg capsule Take 25 mg by mouth at bedtime.      docusate sodium (COLACE) 100 MG capsule Take 1 capsule (100 mg total) by mouth daily. 10 capsule 0   Dulaglutide 0.75 MG/0.5ML SOPN Inject into the skin.     esomeprazole (NEXIUM) 40 MG capsule Take 40 mg by mouth 2 (two) times daily before a meal.     famotidine (PEPCID) 40 MG tablet Take 40 mg by mouth at bedtime.     ferrous sulfate 325 (65 FE) MG tablet Take 1 tablet (325 mg total) by mouth daily with breakfast. 60 tablet 0   furosemide (LASIX) 20 MG tablet Take 20 mg by mouth daily.     gabapentin (NEURONTIN) 600 MG tablet Take 1,200 mg by mouth 2 (two) times daily.     indapamide (LOZOL) 2.5 MG tablet take 1 tablet by mouth once daily. 30 tablet 0   lamoTRIgine (LAMICTAL) 200 MG tablet Take 200 mg by mouth 2 (two) times daily.     methocarbamol (ROBAXIN) 750 MG tablet Take 750 mg by mouth 3 (three) times daily as needed for muscle spasms.     metoprolol succinate (TOPROL-XL) 50 MG 24 hr tablet Take 50 mg by mouth daily.     nitrofurantoin, macrocrystal-monohydrate, (MACROBID) 100 MG capsule Take 1 capsule (100 mg total) by mouth every 12 (twelve) hours. 14 capsule 0   ondansetron (ZOFRAN) 8 MG tablet Take 1 tablet (8 mg total) by mouth every 8 (eight) hours as needed for nausea  or vomiting. 30 tablet 1   simvastatin (ZOCOR) 20 MG tablet Take 20 mg by mouth daily.   1   sucralfate (CARAFATE) 1 g tablet Take 1 g by mouth 4 (four) times daily.     tiZANidine (ZANAFLEX) 4 MG tablet Take 4 mg by mouth at bedtime.     Vitamin D, Ergocalciferol, (DRISDOL) 1.25 MG (50000 UNIT) CAPS capsule Take 50,000 Units by mouth once a week.     No current facility-administered medications for this visit.    Allergies: Allergies  Allergen Reactions   Bee Venom Shortness Of Breath, Swelling and Rash   Cephalexin Anaphylaxis   Cephalexin Anaphylaxis, Itching and Swelling   Ciprofloxacin Hives, Rash and Swelling    hives hives  hives   Penicillins Hives, Rash, Itching and Swelling    Has patient had a  PCN reaction causing immediate rash, facial/tongue/throat swelling, SOB or lightheadedness with hypotension: Unknown Has patient had a PCN reaction causing severe rash involving mucus membranes or skin necrosis: Unknown Has patient had a PCN reaction that required hospitalization: Unknown Has patient had a PCN reaction occurring within the last 10 years: No If all of the above answers are "NO", then may proceed with Cephalosporin use. Has patient had a PCN reaction causing immediate rash, facial/tongue/throat swelling, SOB or lightheadedness with hypotension: Unknown Has patient had a PCN reaction causing severe rash involving mucus membranes or skin necrosis: Unknown Has patient had a PCN reaction that required hospitalization: Unknown Has patient had a PCN reaction occurring within the last 10 years: No If all of the above answers are "NO", then may proceed with Cephalosporin use.   Vancomycin Anaphylaxis, Hives and Itching    Has taken for bronchitis under medical supervision on site with benadryl Other reaction(s): Other (See Comments) Red man syndrome, gave benadryl to help, per RN.  Slow down infusion with future dose.  Has taken for bronchitis under medical supervision on site with benadryl   Carbamazepine     Loss of balance, trembling, inability to think, resulting in ER visit shortly after start of course  Other reaction(s): Other (See Comments) Speech, mind slows down, cant walk  Loss of balance, trembling, inability to think, resulting in ER visit shortly after start of course   Cefotaxime Rash    Other reaction(s): Other (See Comments)   Sulfacetamide Sodium Rash   Doxycycline Diarrhea and Nausea And Vomiting   Adhesive [Tape] Rash   Clindamycin Rash   Latex Rash   Sulfa Antibiotics Rash    Past Medical History:  Diagnosis Date   Allergy    Anemia    Anxiety    Arthritis    knees, multiple joints   Asthma    Bell palsy    Blood transfusion without reported diagnosis     Breast mass    lt breast mass x's 3 years increased in size   Chronic kidney disease    Chronic pain syndrome    COPD (chronic obstructive pulmonary disease) (HCC)    Depression    Diabetes mellitus    Endometrial cancer (HCC)    Fibromyalgia    GERD (gastroesophageal reflux disease)    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 This could reflect an inflammatory process,  although follow-up is necessary to exclude atypical neoplasm. Full  chest CT should be considered to evaluate for other pulmonary  findings.      Multiple thyroid nodules    Nephropathy    Pneumonia    PONV (postoperative nausea and vomiting)    Pulmonary nodules    Thyroid disease    Ulcer    Uterine cancer (HCC) 12/26/2011   Stage 1, grade 1, S/P hysterectomy initially by robotic technique and then salping[facility]oophorectomy at [facility] receiving no postoperative treatment.   Past Surgical History:  Procedure Laterality Date   ABDOMINAL HYSTERECTOMY     endometrial cancer   CHOLECYSTECTOMY     COLONOSCOPY WITH PROPOFOL N/A 01/19/2014   [id number]   CYSTOSCOPY W/ URETERAL STENT PLACEMENT Bilateral 02/07/2022   Procedure: CYSTOSCOPY WITH RETROGRADE PYELOGRAM/RIGHT URETERAL STENT PLACEMENT WITH POSSIBLE LEFT;  Surgeon: Jannifer Hick, MD;  Location: [facility];  Service: Urology;  Laterality: Bilateral;   CYSTOSCOPY WITH RETROGRADE PYELOGRAM, URETEROSCOPY AND STENT PLACEMENT Bilateral 03/06/2022   Procedure: CYSTOSCOPY WITH RETROGRADE PYELOGRAM, URETEROSCOPY AND STENT EXCHANGE;  Surgeon: Malen Gauze, MD;  Location: [facility];  Service: Urology;  Laterality: Bilateral;   DILATION AND CURETTAGE OF UTERUS  12 yrs ago   DILATION AND CURETTAGE  OF UTERUS  06/18/2011   Procedure: DILATATION AND CURETTAGE;  Surgeon: Lazaro Arms, MD;  Location: [facility];  Service: Gynecology;  Laterality: N/A;  Suction Dilation and Curettage   ESOPHAGOGASTRODUODENOSCOPY N/A 10/12/2013   Dr. Rourk:anastomotic ulcer likely cause of bleeding. likely ischemic    ESOPHAGOGASTRODUODENOSCOPY (EGD) WITH PROPOFOL N/A 01/19/2014   [id number]   EXTRACORPOREAL SHOCK WAVE LITHOTRIPSY Left 01/11/2018   Procedure: LEFT EXTRACORPOREAL SHOCK WAVE LITHOTRIPSY (ESWL);  Surgeon: Crista Elliot, MD;  Location: [facility];  Service: Urology;  Laterality: Left;   EXTRACORPOREAL SHOCK WAVE LITHOTRIPSY Left 01/14/2018   Procedure: LEFT EXTRACORPOREAL SHOCK WAVE LITHOTRIPSY (ESWL);  Surgeon: Malen Gauze, MD;  Location: [facility];  Service: Urology;  Laterality: Left;  75 MINS  W/ MAC   GASTRIC BYPASS  2013   [facility]   HOLMIUM LASER APPLICATION Bilateral 03/06/2022   Procedure: HOLMIUM LASER APPLICATION;  Surgeon: Malen Gauze, MD;  Location: [facility];  Service: Urology;  Laterality: Bilateral;   HYSTEROSCOPY WITH D & C  06/18/2011   Procedure: DILATATION AND CURETTAGE /HYSTEROSCOPY;  Surgeon: Lazaro Arms, MD;  Location: [facility];  Service: Gynecology;  Laterality: N/A;   LITHOTRIPSY     paniculectomy     SHOULDER ARTHROSCOPY WITH SUBACROMIAL DECOMPRESSION AND OPEN ROTATOR C Left 07/16/2021   Procedure: LEFT ARTHROSCOPIC SUBACROMIAL DECOMPRESSION, MINI OPEN ROTATOR CUFF TEAR REPAIR, BICEPS TENOTOMY;  Surgeon: Valeria Batman, MD;  Location: [facility];  Service: Orthopedics;  Laterality: Left;   STONE EXTRACTION WITH BASKET Bilateral 03/06/2022   Procedure: STONE EXTRACTION WITH BASKET;  Surgeon: Malen Gauze, MD;  Location: [facility];  Service: Urology;  Laterality: Bilateral;   TRIGGER FINGER RELEASE     WISDOM TOOTH EXTRACTION     Family History  Problem Relation Age of Onset   Pneumonia Mother        Deceased   Arthritis Mother    Asthma Mother    Cancer  Mother        pancreatic   COPD Mother    Depression Mother    Diabetes Mother    Kidney disease Mother    Liver cancer Father  Living   Arthritis Father    Cancer Father        liver, prostate   COPD Father    Depression Father    Stroke Father    Heart disease Maternal Grandmother    Mental illness Maternal Grandmother    Breast cancer Maternal Grandmother    Alcohol abuse Paternal Grandfather    Breast cancer Paternal Aunt    Anesthesia problems Neg Hx    Hypotension Neg Hx    Malignant hyperthermia Neg Hx    Pseudochol deficiency Neg Hx    Colon cancer Neg Hx    Social History   Socioeconomic History   Marital status: Married    Spouse name: Fayrene Fearing "Rusty"   Number of children: Not on file   Years of education: 16   Highest education level: Not on file  Occupational History   Occupation: Curator employed)    Employer: NOT EMPLOYED  Tobacco Use   Smoking status: Never   Smokeless tobacco: Never  Vaping Use   Vaping status: Never Used  Substance and Sexual Activity   Alcohol use: No    Alcohol/week: 0.0 standard drinks of alcohol   Drug use: No   Sexual activity: Yes    Birth control/protection: None, Surgical  Other Topics Concern   Not on file  Social History Narrative   Lives with husband and 2 sons 12 and 15. (adopted)    Wants to work but says she is not dependable.  She does do volunteer work.    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  Working on disability.     Education: bachelors degree.   Disabled because of fibromyalgia and depression since 2010   Social Determinants of Health   Financial Resource Strain: Low Risk  (06/20/2022)   Received from [facility], [facility]   Overall Financial Resource Strain (CARDIA)    Difficulty of Paying Living Expenses: Not hard at all  Food Insecurity: No Food Insecurity (06/20/2022)   Received from [facility], [facility]   Hunger Vital Sign    Worried About Running Out of Food in  the Last Year: Never true    Ran Out of Food in the Last Year: Never true  Transportation Needs: No Transportation Needs (06/20/2022)   Received from [facility], [facility]   PRAPARE - Transportation    Lack of Transportation (Medical): No    Lack of Transportation (Non-Medical): No  Physical Activity: Inactive (03/19/2021)   Received from [facility], [facility]   Exercise Vital Sign    Days of Exercise per Week: 0 days    Minutes of Exercise per Session: 0 min  Stress: Stress Concern Present (03/19/2021)   Received from [facility], [facility] of Occupational [facility] - Occupational Stress Questionnaire    Feeling of Stress : Very much  Social Connections: Unknown (09/15/2021)   Received from [facility], [facility]   Social Network    Social Network: Not on file  Intimate Partner Violence: Not At Risk (02/06/2022)   Humiliation, Afraid, Rape, and Kick questionnaire    Fear of Current or Ex-Partner: No    Emotionally Abused: No    Physically Abused: No    Sexually Abused: No    SUBJECTIVE  Review of Systems Constitutional: Patient denies any unintentional weight loss or change in strength lntegumentary: Patient denies any rashes or pruritus Cardiovascular: Patient denies chest pain or syncope Respiratory: Patient denies shortness of breath Gastrointestinal:  Patient denies vomiting, constipation, or diarrhea Musculoskeletal: Patient denies muscle cramps or weakness Neurologic: Patient denies convulsions or seizures Allergic/Immunologic: Patient denies recent allergic reaction(s) Hematologic/Lymphatic: Patient denies bleeding tendencies Endocrine: Patient denies heat/cold intolerance  GU: As per HPI.  OBJECTIVE Vitals:   04/06/23 1154  BP: 119/80  Pulse: 76  Temp: 98 F (36.7 C)   There is no height or weight on file to calculate BMI.  Physical Examination Constitutional: No obvious distress; patient is non-toxic appearing   Cardiovascular: No visible lower extremity edema.  Respiratory: The patient does not have audible wheezing/stridor; respirations do not appear labored  Gastrointestinal: Abdomen non-distended Musculoskeletal: Normal ROM of UEs  Skin: No obvious rashes/open sores  Neurologic: CN 2-12 grossly intact Psychiatric: Answered questions appropriately with normal affect  Hematologic/Lymphatic/Immunologic: No obvious bruises or sites of spontaneous bleeding  UA: >30 WBC/hpf, 11-30 RBC/hpf, many bacteria  PVR: 0 ml  ASSESSMENT UTI symptoms - Plan: Urine Culture, nitrofurantoin, macrocrystal-monohydrate, (MACROBID) 100 MG capsule  Abnormal urinalysis - Plan: nitrofurantoin, macrocrystal-monohydrate, (MACROBID) 100 MG capsule  Abnormal UA. Will check urine culture and treat empirically with Macrobid while awaiting culture results and sensitivities.  We discussed importance of limiting Pyridium (phenazopyridine) use to 3 days consecutively due to risk for liver injury, methemoglobinemia, and damage to bone health.  We discussed option to obtain updated imaging for stone surveillance; she feels confident that she has passed any stone which was present and therefore declined KUB or RUS at this time.  Will plan for follow up as previously scheduled with Molly Berry on 01/27/2024 or sooner if needed. Pt verbalized understanding and agreement. All questions were answered.  PLAN Advised the following: Urine culture. Macrobid (Nitrofurantoin) 100 mg 2x/day x7 days. Return in 10 months (on 01/27/2024) for f/u with Molly Berry as previously scheduled.  Orders Placed This Encounter  Procedures   Urine Culture    It has been explained that the patient is to follow regularly with their PCP in addition to all other providers involved in their care and to follow instructions provided by these respective offices. Patient advised to contact urology clinic if any urologic-pertaining questions, concerns, new  symptoms or problems arise in the interim period.  There are no Patient Instructions on file for this visit.  Electronically signed by:  Donnita Falls, MSN, FNP-C, CUNP 04/06/2023 12:18 PM

## 2023-04-06 NOTE — Progress Notes (Signed)
Pvr= 0

## 2023-04-06 NOTE — Addendum Note (Signed)
Addended by: Christoper Fabian R on: 04/06/2023 02:05 PM   Modules accepted: Orders

## 2023-04-07 ENCOUNTER — Encounter
Payer: Commercial Managed Care - HMO | Attending: Physical Medicine & Rehabilitation | Admitting: Physical Medicine & Rehabilitation

## 2023-04-07 ENCOUNTER — Encounter: Payer: Self-pay | Admitting: Physical Medicine & Rehabilitation

## 2023-04-07 VITALS — BP 117/81 | HR 64 | Ht 66.0 in | Wt 231.0 lb

## 2023-04-07 DIAGNOSIS — M797 Fibromyalgia: Secondary | ICD-10-CM | POA: Diagnosis not present

## 2023-04-07 DIAGNOSIS — R109 Unspecified abdominal pain: Secondary | ICD-10-CM | POA: Insufficient documentation

## 2023-04-07 DIAGNOSIS — M170 Bilateral primary osteoarthritis of knee: Secondary | ICD-10-CM | POA: Insufficient documentation

## 2023-04-07 DIAGNOSIS — Z79891 Long term (current) use of opiate analgesic: Secondary | ICD-10-CM | POA: Diagnosis present

## 2023-04-07 MED ORDER — OXYCODONE-ACETAMINOPHEN 7.5-325 MG PO TABS: 1.0000 | ORAL_TABLET | Freq: Four times a day (QID) | ORAL | 0 refills | Status: DC | PRN

## 2023-04-07 NOTE — Progress Notes (Addendum)
Subjective:    Molly Berry ID: Molly Berry, female    DOB: [birth year], 57 y.o.   MRN: [id number]  HPI  HPI 08/18/22 Molly Berry is a 57 y.o. year old female  who  has a past medical history of Allergy, Anemia, Anxiety, Arthritis, Asthma, Bell palsy, Blood transfusion without reported diagnosis, Breast mass, Chronic kidney disease, Chronic pain syndrome, COPD (chronic obstructive pulmonary disease), Depression, Diabetes mellitus, Endometrial cancer, Fibromyalgia, GERD (gastroesophageal reflux disease), H/O gastric bypass (05/09/2014), Headache, Hyperlipidemia, Hypertension, Hypothyroidism, Multiple pulmonary nodules (11/30/2012), Multiple thyroid nodules, Nephropathy, Pneumonia, PONV (postoperative nausea and vomiting), Pulmonary nodules, Thyroid disease, Ulcer, and Uterine cancer (12/26/2011).   They are presenting to PM&R clinic as a new Molly Berry for pain management evaluation.  Molly Berry reports that most of her pain is related to her history of fibromyalgia and osteoarthritis particular of her knees.  She has widespread pain throughout her whole body from fibromyalgia.  She has had knee pain for several years and her knees are her worst area of pain. It was initially worse in her right knee, then her left knee became more severe.  She reports she had cortisone injections with slight benefit however she cannot get gel injections approved by insurance.  She would like to have knee surgery and is followed by orthopedics most recently seen by Legrand Como, PA.  She is working on weight loss in order to be able to qualify for surgery.  She was previously seen by [facility].  Her pain was previously managed with oxycodone 10 mg 3 times a day.  She reports she was with this practice for about 7 years.  She does report she was discharged after she took a tramadol by a family member gave her when she had had multiple procedures and surgeries for renal stones.  Since this time her PCP has been  prescribing her oxycodone 10 mg.     No red flags for back pain endorsed in Hx or ROS   Medications tried: Nsaids meloxicam helps, rare use due to concern for kidneys Tylenol - helps slightly  Opiates  oxycodone 10mg  helps, has used this for many years Gabapentin 1200mg  BID  Lyrica- reports reason she can't take it but doesn't remember what that was  Lamictal for mood  Buprenorphine - bad feeling  Tramadol didn't help  TCAs  - didn't help SNRIs  cymbalta, did not tolerate it  Other   Tizanidine/robaxin slightly helps    Other treatments: PT/OT  - helped in the past, she likes "integrative therapy" TENs unit - made her sore  Injections - knee cortisone   Interval history 09/15/2022 Molly Berry also reports she has been doing well overall regarding her pain control.  Her pain has been severe in her right back and flank recently.  She reports follow-up with urology today in clinic, says she has had multiple issues with kidney stones.  She also had a UA ordered today.  She was started on Macrobid.  Prior to this her pain was much better controlled on Percocet 7.5 mg 4 times a day.  This was providing more consistent pain control than Percocet 10 mg 3 times a day.  Molly Berry reports she got message from [facility] however needs to call them back.   Interval history 09/15/2022 Molly Berry also reports she has been doing well overall regarding her pain control.  Her pain has been severe in her right back and flank recently.  She reports follow-up with urology today in clinic,  says she has had multiple issues with kidney stones.  She also had a UA ordered today.  She was started on Macrobid.  Prior to this her pain was much better controlled on Percocet 7.5 mg 4 times a day.  This was providing more consistent pain control than Percocet 10 mg 3 times a day.  Molly Berry reports she got message from [facility] however needs to call them back.   Interval history 03/02/23 Molly Berry is here for follow-up regarding her  chronic pain.  She reports that pain continues to be poorly controlled.  Her worst pain is at her knees however she has pain all throughout her body.  Percocet 7.5 mg helps some days, other days her pain continues to be very severe.  No side effects with the medication.  She is planning on having knee replacement surgery with Dr. Magnus Ivan.  Interval history 04/07/23 Molly Berry here for follow-up regarding her chronic pain.  Percocet continues to help keep her pain more tolerable.  She does have left TKA scheduled with Dr. Magnus Ivan on 04/17/2023.  She is a little concerned about postoperative pain.  She has been limited in her ability to start physical therapy because her truck is having mechanical issues.     Pain Inventory Average Pain 8 Pain Right Now 7 My pain is constant and aching  In the last 24 hours, has pain interfered with the following? General activity 8 Relation with others 9 Enjoyment of life 9 What TIME of day is your pain at its worst? morning , daytime, evening, and night Sleep (in general) Fair  Pain is worse with: bending and standing Pain improves with: rest and medication Relief from Meds: 5  Family History  Problem Relation Age of Onset   Pneumonia Mother        Deceased   Arthritis Mother    Asthma Mother    Cancer Mother        pancreatic   COPD Mother    Depression Mother    Diabetes Mother    Kidney disease Mother    Liver cancer Father        Living   Arthritis Father    Cancer Father        liver, prostate   COPD Father    Depression Father    Stroke Father    Heart disease Maternal Grandmother    Mental illness Maternal Grandmother    Breast cancer Maternal Grandmother    Alcohol abuse Paternal Grandfather    Breast cancer Paternal Aunt    Anesthesia problems Neg Hx    Hypotension Neg Hx    Malignant hyperthermia Neg Hx    Pseudochol deficiency Neg Hx    Colon cancer Neg Hx    Social History   Socioeconomic History   Marital status:  Married    Spouse name: Fayrene Fearing "Rusty"   Number of children: Not on file   Years of education: 16   Highest education level: Not on file  Occupational History   Occupation: Curator employed)    Employer: NOT EMPLOYED  Tobacco Use   Smoking status: Never   Smokeless tobacco: Never  Vaping Use   Vaping status: Never Used  Substance and Sexual Activity   Alcohol use: No    Alcohol/week: 0.0 standard drinks of alcohol   Drug use: No   Sexual activity: Yes    Birth control/protection: None, Surgical  Other Topics Concern   Not on file  Social History Narrative   Lives  with husband and 2 sons 76 and 58. (adopted)    Wants to work but says she is not dependable.  She does do volunteer work.    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  Working on disability.     Education: bachelors degree.   Disabled because of fibromyalgia and depression since 2010   Social Determinants of Health   Financial Resource Strain: Low Risk  (06/20/2022)   Received from [facility], [facility]   Overall Financial Resource Strain (CARDIA)    Difficulty of Paying Living Expenses: Not hard at all  Food Insecurity: No Food Insecurity (06/20/2022)   Received from [facility], [facility]   Hunger Vital Sign    Worried About Running Out of Food in the Last Year: Never true    Ran Out of Food in the Last Year: Never true  Transportation Needs: No Transportation Needs (06/20/2022)   Received from [facility], [facility]   PRAPARE - Transportation    Lack of Transportation (Medical): No    Lack of Transportation (Non-Medical): No  Physical Activity: Inactive (03/19/2021)   Received from [facility], [facility]   Exercise Vital Sign    Days of Exercise per Week: 0 days    Minutes of Exercise per Session: 0 min  Stress: Stress Concern Present (03/19/2021)   Received from [facility], [facility] - Occupational Stress Questionnaire     Feeling of Stress : Very much  Social Connections: Unknown (09/15/2021)   Received from [facility], [facility]   Social Network    Social Network: Not on file   Past Surgical History:  Procedure Laterality Date   ABDOMINAL HYSTERECTOMY     endometrial cancer   CHOLECYSTECTOMY     COLONOSCOPY WITH PROPOFOL N/A 01/19/2014   RUE:[id number]   CYSTOSCOPY W/ URETERAL STENT PLACEMENT Bilateral 02/07/2022   Procedure: CYSTOSCOPY WITH RETROGRADE PYELOGRAM/RIGHT URETERAL STENT PLACEMENT WITH POSSIBLE LEFT;  Surgeon: Jannifer Hick, MD;  Location: [facility];  Service: Urology;  Laterality: Bilateral;   CYSTOSCOPY WITH RETROGRADE PYELOGRAM, URETEROSCOPY AND STENT PLACEMENT Bilateral 03/06/2022   Procedure: CYSTOSCOPY WITH RETROGRADE PYELOGRAM, URETEROSCOPY AND STENT EXCHANGE;  Surgeon: Malen Gauze, MD;  Location: [facility];  Service: Urology;  Laterality: Bilateral;   DILATION AND CURETTAGE OF UTERUS  12 yrs ago   DILATION AND CURETTAGE OF UTERUS  06/18/2011   Procedure: DILATATION AND CURETTAGE;  Surgeon: Lazaro Arms, MD;  Location: [facility];  Service: Gynecology;  Laterality: N/A;  Suction Dilation and Curettage   ESOPHAGOGASTRODUODENOSCOPY N/A 10/12/2013   Dr. Rourk:anastomotic ulcer likely cause of bleeding. likely ischemic    ESOPHAGOGASTRODUODENOSCOPY (EGD) WITH PROPOFOL N/A 01/19/2014   [id number]   EXTRACORPOREAL SHOCK WAVE LITHOTRIPSY Left 01/11/2018   Procedure: LEFT EXTRACORPOREAL SHOCK WAVE LITHOTRIPSY (ESWL);  Surgeon: Crista Elliot, MD;  [facility]: [facility];  Service: Urology;  Laterality: Left;   EXTRACORPOREAL SHOCK WAVE LITHOTRIPSY Left 01/14/2018   Procedure: LEFT EXTRACORPOREAL SHOCK WAVE LITHOTRIPSY (ESWL);  Surgeon: Malen Gauze, MD;  Location: [facility];  Service: Urology;  Laterality: Left;  75 MINS  W/ MAC   GASTRIC BYPASS  2013   [facility]   HOLMIUM LASER APPLICATION Bilateral 03/06/2022   Procedure: HOLMIUM LASER APPLICATION;  Surgeon: Malen Gauze, MD;   Location: [facility];  Service: Urology;  Laterality: Bilateral;   HYSTEROSCOPY WITH D & C  06/18/2011   Procedure: DILATATION AND CURETTAGE /HYSTEROSCOPY;  Surgeon:  Lazaro Arms, MD;  Location: [facility];  Service: Gynecology;  Laterality: N/A;   LITHOTRIPSY     paniculectomy     SHOULDER ARTHROSCOPY WITH SUBACROMIAL DECOMPRESSION AND OPEN ROTATOR C Left 07/16/2021   Procedure: LEFT ARTHROSCOPIC SUBACROMIAL DECOMPRESSION, MINI OPEN ROTATOR CUFF TEAR REPAIR, BICEPS TENOTOMY;  Surgeon: Valeria Batman, MD;  [facility];  Service: Orthopedics;  Laterality: Left;   STONE EXTRACTION WITH BASKET Bilateral 03/06/2022   Procedure: STONE EXTRACTION WITH BASKET;  Surgeon: Malen Gauze, MD;  [facility]: [facility];  Service: Urology;  Laterality: Bilateral;   TRIGGER FINGER RELEASE     WISDOM TOOTH EXTRACTION     Past Surgical History:  Procedure Laterality Date   ABDOMINAL HYSTERECTOMY     endometrial cancer   CHOLECYSTECTOMY     COLONOSCOPY WITH PROPOFOL N/A 01/19/2014   [id number]   CYSTOSCOPY W/ URETERAL STENT PLACEMENT Bilateral 02/07/2022   Procedure: CYSTOSCOPY WITH RETROGRADE PYELOGRAM/RIGHT URETERAL STENT PLACEMENT WITH POSSIBLE LEFT;  Surgeon: Jannifer Hick, MD;  [facility];  Service: Urology;  Laterality: Bilateral;   CYSTOSCOPY WITH RETROGRADE PYELOGRAM, URETEROSCOPY AND STENT PLACEMENT Bilateral 03/06/2022   Procedure: CYSTOSCOPY WITH RETROGRADE PYELOGRAM, URETEROSCOPY AND STENT EXCHANGE;  Surgeon: Malen Gauze, MD;  [facility];  Service: Urology;  Laterality: Bilateral;   DILATION AND CURETTAGE OF UTERUS  12 yrs ago   DILATION AND CURETTAGE OF UTERUS  06/18/2011   Procedure: DILATATION AND CURETTAGE;  Surgeon: Lazaro Arms, MD;  [facility]: [facility];  Service: Gynecology;  Laterality: N/A;  Suction Dilation and Curettage   ESOPHAGOGASTRODUODENOSCOPY N/A 10/12/2013   Dr. Rourk:anastomotic ulcer likely cause of bleeding. likely ischemic    ESOPHAGOGASTRODUODENOSCOPY  (EGD) WITH PROPOFOL N/A 01/19/2014   [id number]   EXTRACORPOREAL SHOCK WAVE LITHOTRIPSY Left 01/11/2018   Procedure: LEFT EXTRACORPOREAL SHOCK WAVE LITHOTRIPSY (ESWL);  Surgeon: Crista Elliot, MD;  [facility];  Service: Urology;  Laterality: Left;   EXTRACORPOREAL SHOCK WAVE LITHOTRIPSY Left 01/14/2018   Procedure: LEFT EXTRACORPOREAL SHOCK WAVE LITHOTRIPSY (ESWL);  Surgeon: Malen Gauze, MD;  [facility];  Service: Urology;  Laterality: Left;  75 MINS  W/ MAC   GASTRIC BYPASS  2013   [facility]   HOLMIUM LASER APPLICATION Bilateral 03/06/2022   Procedure: HOLMIUM LASER APPLICATION;  Surgeon: Malen Gauze, MD;  [facility]: [facility];  Service: Urology;  Laterality: Bilateral;   HYSTEROSCOPY WITH D & C  06/18/2011   Procedure: DILATATION AND CURETTAGE /HYSTEROSCOPY;  Surgeon: Lazaro Arms, MD;  [facility]: [facility];  Service: Gynecology;  Laterality: N/A;   LITHOTRIPSY     paniculectomy     SHOULDER ARTHROSCOPY WITH SUBACROMIAL DECOMPRESSION AND OPEN ROTATOR C Left 07/16/2021   Procedure: LEFT ARTHROSCOPIC SUBACROMIAL DECOMPRESSION, MINI OPEN ROTATOR CUFF TEAR REPAIR, BICEPS TENOTOMY;  Surgeon: Valeria Batman, MD;  [facility];  Service: Orthopedics;  Laterality: Left;   STONE EXTRACTION WITH BASKET Bilateral 03/06/2022   Procedure: STONE EXTRACTION WITH BASKET;  Surgeon: Malen Gauze, MD;  [facility];  Service: Urology;  Laterality: Bilateral;   TRIGGER FINGER RELEASE     WISDOM TOOTH EXTRACTION     Past Medical History:  Diagnosis Date   Allergy    Anemia    Anxiety    Arthritis    knees, multiple joints   Asthma    Bell palsy    Blood transfusion without reported diagnosis    Breast mass    lt breast mass x's 3  years increased in size   Chronic kidney disease    Chronic pain syndrome    COPD (chronic obstructive pulmonary disease) (HCC)    Depression    Diabetes mellitus    Endometrial cancer (HCC)    Fibromyalgia    GERD  (gastroesophageal reflux disease)    H/O gastric bypass 05/09/2014   At [facility]   Headache    mirgraines   Hyperlipidemia    Hypertension    Hypothyroidism    Multiple pulmonary nodules 11/30/2012   Followed in Pulmonary clinic/ [facility]/ [place]  - See CT abd  10/29/12  New right lower lobe pulmonary nodularity, primarily ground-  glass in density. This could reflect an inflammatory process,  although follow-up is necessary to exclude atypical neoplasm. Full  chest CT should be considered to evaluate for other pulmonary  findings.      Multiple thyroid nodules    Nephropathy    Pneumonia    PONV (postoperative nausea and vomiting)    Pulmonary nodules    Thyroid disease    Ulcer    Uterine cancer (HCC) 12/26/2011   Stage 1, grade 1, S/P hysterectomy initially by robotic technique and then salpingo[facility]oophorectomy at [facility] receiving no postoperative treatment.   BP 117/81   Pulse 64   Ht 5\' 6"  (1.676 m)   Wt 231 lb (104.8 kg)   LMP 05/06/2011   SpO2 94%   BMI 37.28 kg/m   Opioid Risk Score:   Fall Risk Score:  `1  Depression screen [facility] 2/9     03/02/2023   10:20 AM 01/02/2023    1:45 PM 09/15/2022   11:06 AM 08/18/2022    2:11 PM 10/30/2016    4:49 PM 07/07/2016    2:11 PM 03/09/2015    3:28 PM  Depression screen PHQ 2/9  Decreased Interest 0 1 2 2  0 0 0  Down, Depressed, Hopeless 0 1 2 2  0 0 0  PHQ - 2 Score 0 2 4 4  0 0 0  Altered sleeping    3     Tired, decreased energy    2     Change in appetite    2     Feeling bad or failure about yourself     2     Trouble concentrating    2     Moving slowly or fidgety/restless    0     Suicidal thoughts    0     PHQ-9 Score    15     Difficult doing work/chores    Extremely dIfficult         Review of Systems  Musculoskeletal:  Positive for back pain, gait problem and neck pain.       B/L hand, knees,  right foot pain, left shoulder  All other systems reviewed and are negative.      Objective:   Physical  Exam   Gen: no distress, normal appearing HEENT: oral mucosa pink and moist, NCAT Chest: normal effort, normal rate of breathing Abd: soft, non-distended Ext: no edema Psych:  pleasant and appropriate  Skin: intact Neuro: Alert and awake, follows commands, cranial nerves II through XII grossly intact DTR normal and symmetric No focal motor or sensory deficits noted No dysmetria or ataxia Musculoskeletal: No knee erythema, warmth or effusion Bilateral knee joint line tenderness medial and lateral, left greater than right TTP lumbar and cervical paraspinal muscles, shoulders, periscapular muscles, elbows, greater trochanters, ankles Slightly decreased L-spine and C-spine  ROM in all directions     Xray L knee result 03/10/22 "Three-view radiographs of the left knee were obtained today.  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  She has periarticular  osteophytes no acute fractures are noted "   Xray L knee result 12/02/21 " Radiographs of her right knee in 3 projections were reviewed today.  She  has tricompartmental arthritis with most changes in the medial compartment  with near bone-on-bone.  Also patellofemoral sclerosis and osteophyte  formation no acute fractures "        Assessment & Plan:   Knee OA -Foods for pain list provided -Molly Berry reports her pain is doing better with Percocet 7.5 mg 4 times a day.  - -Pain agreement prior visit -Could consider genicular nerve block if she does not have surgery -She reports Dr. Magnus Ivan has schedule TKA 04/17/23 -Will likely need increased dose of pain medication after her surgery, could consider Oxycodone (15mg ) every 4 hours PRN.  Knee surgery may provide her long-term benefits, although she will will likely need higher doses of pain medication for postsurgical pain control due to her  chronic use of Percocet.  Surgery may also cause a flare with her fibromyalgia. -ORT low  -Continue PDMP/Random UDS -Continue pill counts. She is out of medication today as expected. Advised to bring empty bottle next time.  -Can do next visit televisit    Fibromyalgia  -Continue tizanidine as needed -Continue gabapentin 1200 mg twice daily -New consult for aquatic therapy placed-she is limited by distance and cost because she lives in [place]- Her cars also need repair -Zynex Nexwave device ordered, Zynex Cryoheat blanket ordered prior visit  Right back and flank pain with Hx of Kidney stones -Continue follow-up with urology- She was recently started on abx for UTI    04/22/23 Called pt, she was ordered dilaudid but this caused her her feel altered. Will DC dilaudid- asked her to keep so we can count next visit. Will changed Percocet  1-2 tabs 7.5 mg q6h PRN times a day.     04/24/23 Called pt, she had tried using using the percocet 7.5 1 tab Q4h, we originally discussed trying this during our last call. She reports Dr. Magnus Ivan has ordered oxy 5 1-2 tabs, Q4h. I think this is a good plan. She has no further questions.

## 2023-04-08 LAB — URINE CULTURE

## 2023-04-09 ENCOUNTER — Telehealth: Payer: Self-pay

## 2023-04-09 NOTE — Telephone Encounter (Signed)
Patient is made aware and voiced understanding. 

## 2023-04-09 NOTE — Telephone Encounter (Signed)
-----   Message from Molly Berry sent at 04/09/2023  8:32 AM EST ----- Please let pt know urine culture result. The Macrobid which was prescribed should cover this pathogen. Thanks.

## 2023-04-09 NOTE — Patient Instructions (Signed)
DUE TO COVID-19 ONLY TWO VISITORS  (aged 57 and older)  ARE ALLOWED TO COME WITH YOU AND STAY IN THE WAITING ROOM ONLY DURING PRE OP AND PROCEDURE.   **NO VISITORS ARE ALLOWED IN THE SHORT STAY AREA OR RECOVERY ROOM!!**  IF YOU WILL BE ADMITTED INTO THE HOSPITAL YOU ARE ALLOWED ONLY FOUR SUPPORT PEOPLE DURING VISITATION HOURS ONLY (7 AM -8PM)   The support person(s) must pass our screening, gel in and out, and wear a mask at all times, including in the patient's room. Patients must also wear a mask when staff or their support person are in the room. Visitors GUEST BADGE MUST BE WORN VISIBLY  One adult visitor may remain with you overnight and MUST be in the room by 8 P.M.     Your procedure is scheduled on: 04/17/23   Report to [facility] Main Entrance    Report to admitting at : 11:00 AM   Call this number if you have problems the morning of surgery [phone]   Do not eat food :After Midnight.   After Midnight you may have the following liquids until : 10:30 AM DAY OF SURGERY  Water Black Coffee (sugar ok, NO MILK/CREAM OR CREAMERS)  Tea (sugar ok, NO MILK/CREAM OR CREAMERS) regular and decaf                             Plain Jell-O (NO RED)                                           Fruit ices (not with fruit pulp, NO RED)                                     Popsicles (NO RED)                                                                  Juice: apple, WHITE grape, WHITE cranberry Sports drinks like Gatorade (NO RED)   The day of surgery:  Drink ONE (1) Pre-Surgery Clear G2 at : 10:30 AM the morning of surgery. Drink in one sitting. Do not sip.  This drink was given to you during your hospital  pre-op appointment visit. Nothing else to drink after completing the  Pre-Surgery Clear Ensure or G2.          If you have questions, please contact your surgeon's office.  FOLLOW  AND ANY ADDITIONAL PRE OP INSTRUCTIONS YOU RECEIVED FROM YOUR SURGEON'S OFFICE!!!   Oral  Hygiene is also important to reduce your risk of infection.                                    Remember - BRUSH YOUR TEETH THE MORNING OF SURGERY WITH YOUR REGULAR TOOTHPASTE  DENTURES WILL BE REMOVED PRIOR TO SURGERY PLEASE DO NOT APPLY "Poly grip" OR ADHESIVES!!!   Do NOT smoke after Midnight   Take these medicines the morning  of surgery with A SIP OF WATER: gabapentin,lamotrigine,metoprolol,Macrobid,dicyclomine,esomeprazole.Alprazolam,sumatriptan as needed. How to Manage Your Diabetes Before and After Surgery  Why is it important to control my blood sugar before and after surgery? Improving blood sugar levels before and after surgery helps healing and can limit problems. A way of improving blood sugar control is eating a healthy diet by:  Eating less sugar and carbohydrates  Increasing activity/exercise  Talking with your doctor about reaching your blood sugar goals High blood sugars (greater than 180 mg/dL) can raise your risk of infections and slow your recovery, so you will need to focus on controlling your diabetes during the weeks before surgery. Make sure that the doctor who takes care of your diabetes knows about your planned surgery including the date and location.  How do I manage my blood sugar before surgery? Check your blood sugar at least 4 times a day, starting 2 days before surgery, to make sure that the level is not too high or low. Check your blood sugar the morning of your surgery when you wake up and every 2 hours until you get to the Short Stay unit. If your blood sugar is less than 70 mg/dL, you will need to treat for low blood sugar: Do not take insulin. Treat a low blood sugar (less than 70 mg/dL) with  cup of clear juice (cranberry or apple), 4 glucose tablets, OR glucose gel. Recheck blood sugar in 15 minutes after treatment (to make sure it is greater than 70 mg/dL). If your blood sugar is not greater than 70 mg/dL on recheck, call [phone] for further  instructions. Report your blood sugar to the short stay nurse when you get to Short Stay.  If you are admitted to the hospital after surgery: Your blood sugar will be checked by the staff and you will probably be given insulin after surgery (instead of oral diabetes medicines) to make sure you have good blood sugar levels. The goal for blood sugar control after surgery is 80-180 mg/dL.   WHAT DO I DO ABOUT MY DIABETES MEDICATION?  THE MORNING OF SURGERY, DO NOT TAKE ANY ORAL DIABETIC MEDICATIONS DAY OF YOUR SURGERY  DO NOT TAKE THE FOLLOWING 7 DAYS PRIOR TO SURGERY: Ozempic, Wegovy, Rybelsus (Semaglutide), Byetta (exenatide), Bydureon (exenatide ER), Victoza, Saxenda (liraglutide), or Trulicity (dulaglutide) Mounjaro (Tirzepatide) Adlyxin (Lixisenatide), Polyethylene Glycol Loxenatide. HOLD trulicity after: 04/09/23                              You may not have any metal on your body including hair pins, jewelry, and body piercing             Do not wear make-up, lotions, powders, perfumes/cologne, or deodorant  Do not wear nail polish including gel and S&S, artificial/acrylic nails, or any other type of covering on natural nails including finger and toenails. If you have artificial nails, gel coating, etc. that needs to be removed by a nail salon please have this removed prior to surgery or surgery may need to be canceled/ delayed if the surgeon/ anesthesia feels like they are unable to be safely monitored.   Do not shave  48 hours prior to surgery.    Do not bring valuables to the hospital. [facility] IS NOT             RESPONSIBLE   FOR VALUABLES.   Contacts, glasses, or bridgework may not be worn into surgery.   Bring  small overnight bag day of surgery.   DO NOT BRING YOUR HOME MEDICATIONS TO THE HOSPITAL. PHARMACY WILL DISPENSE MEDICATIONS LISTED ON YOUR MEDICATION LIST TO YOU DURING YOUR ADMISSION IN THE HOSPITAL!    Patients discharged on the day of surgery will not be allowed  to drive home.  Someone NEEDS to stay with you for the first 24 hours after anesthesia.   Special Instructions: Bring a copy of your healthcare power of attorney and living will documents         the day of surgery if you haven't scanned them before.              Please read over the following fact sheets you were given: IF YOU HAVE QUESTIONS ABOUT YOUR PRE-OP INSTRUCTIONS PLEASE CALL [phone]      Pre-operative 5 CHG Bath Instructions   You can play a key role in reducing the risk of infection after surgery. Your skin needs to be as free of germs as possible. You can reduce the number of germs on your skin by washing with CHG (chlorhexidine gluconate) soap before surgery. CHG is an antiseptic soap that kills germs and continues to kill germs even after washing.   DO NOT use if you have an allergy to chlorhexidine/CHG or antibacterial soaps. If your skin becomes reddened or irritated, stop using the CHG and notify one of our RNs at : [phone].   Please shower with the CHG soap starting 4 days before surgery using the following schedule:     Please keep in mind the following:  DO NOT shave, including legs and underarms, starting the day of your first shower.   You may shave your face at any point before/day of surgery.  Place clean sheets on your bed the day you start using CHG soap. Use a clean washcloth (not used since being washed) for each shower. DO NOT sleep with pets once you start using the CHG.   CHG Shower Instructions:  If you choose to wash your hair and private area, wash first with your normal shampoo/soap.  After you use shampoo/soap, rinse your hair and body thoroughly to remove shampoo/soap residue.  Turn the water OFF and apply about 3 tablespoons (45 ml) of CHG soap to a CLEAN washcloth.  Apply CHG soap ONLY FROM YOUR NECK DOWN TO YOUR TOES (washing for 3-5 minutes)  DO NOT use CHG soap on face, private areas, open wounds, or sores.  Pay special attention to  the area where your surgery is being performed.  If you are having back surgery, having someone wash your back for you may be helpful. Wait 2 minutes after CHG soap is applied, then you may rinse off the CHG soap.  Pat dry with a clean towel  Put on clean clothes/pajamas   If you choose to wear lotion, please use ONLY the CHG-compatible lotions on the back of this paper.     Additional instructions for the day of surgery: DO NOT APPLY any lotions, deodorants, cologne, or perfumes.   Put on clean/comfortable clothes.  Brush your teeth.  Ask your nurse before applying any prescription medications to the skin.   CHG Compatible Lotions   Aveeno Moisturizing lotion  Cetaphil Moisturizing Cream  Cetaphil Moisturizing Lotion  Clairol Herbal Essence Moisturizing Lotion, Dry Skin  Clairol Herbal Essence Moisturizing Lotion, Extra Dry Skin  Clairol Herbal Essence Moisturizing Lotion, Normal Skin  Curel Age Defying Therapeutic Moisturizing Lotion with Alpha Hydroxy  Curel Extreme Care  Body Lotion  Curel Soothing Hands Moisturizing Hand Lotion  Curel Therapeutic Moisturizing Cream, Fragrance-Free  Curel Therapeutic Moisturizing Lotion, Fragrance-Free  Curel Therapeutic Moisturizing Lotion, Original Formula  Eucerin Daily Replenishing Lotion  Eucerin Dry Skin Therapy Plus Alpha Hydroxy Crme  Eucerin Dry Skin Therapy Plus Alpha Hydroxy Lotion  Eucerin Original Crme  Eucerin Original Lotion  Eucerin Plus Crme Eucerin Plus Lotion  Eucerin TriLipid Replenishing Lotion  Keri Anti-Bacterial Hand Lotion  Keri Deep Conditioning Original Lotion Dry Skin Formula Softly Scented  Keri Deep Conditioning Original Lotion, Fragrance Free Sensitive Skin Formula  Keri Lotion Fast Absorbing Fragrance Free Sensitive Skin Formula  Keri Lotion Fast Absorbing Softly Scented Dry Skin Formula  Keri Original Lotion  Keri Skin Renewal Lotion Keri Silky Smooth Lotion  Keri Silky Smooth Sensitive Skin Lotion   Nivea Body Creamy Conditioning Oil  Nivea Body Extra Enriched Lotion  Nivea Body Original Lotion  Nivea Body Sheer Moisturizing Lotion Nivea Crme  Nivea Skin Firming Lotion  NutraDerm 30 Skin Lotion  NutraDerm Skin Lotion  NutraDerm Therapeutic Skin Cream  NutraDerm Therapeutic Skin Lotion  ProShield Protective Hand Cream  Provon moisturizing lotion   Incentive Spirometer  An incentive spirometer is a tool that can help keep your lungs clear and active. This tool measures how well you are filling your lungs with each breath. Taking long deep breaths may help reverse or decrease the chance of developing breathing (pulmonary) problems (especially infection) following: A long period of time when you are unable to move or be active. BEFORE THE PROCEDURE  If the spirometer includes an indicator to show your best effort, your nurse or respiratory therapist will set it to a desired goal. If possible, sit up straight or lean slightly forward. Try not to slouch. Hold the incentive spirometer in an upright position. INSTRUCTIONS FOR USE  Sit on the edge of your bed if possible, or sit up as far as you can in bed or on a chair. Hold the incentive spirometer in an upright position. Breathe out normally. Place the mouthpiece in your mouth and seal your lips tightly around it. Breathe in slowly and as deeply as possible, raising the piston or the ball toward the top of the column. Hold your breath for 3-5 seconds or for as long as possible. Allow the piston or ball to fall to the bottom of the column. Remove the mouthpiece from your mouth and breathe out normally. Rest for a few seconds and repeat Steps 1 through 7 at least 10 times every 1-2 hours when you are awake. Take your time and take a few normal breaths between deep breaths. The spirometer may include an indicator to show your best effort. Use the indicator as a goal to work toward during each repetition. After each set of 10 deep breaths,  practice coughing to be sure your lungs are clear. If you have an incision (the cut made at the time of surgery), support your incision when coughing by placing a pillow or rolled up towels firmly against it. Once you are able to get out of bed, walk around indoors and cough well. You may stop using the incentive spirometer when instructed by your caregiver.  RISKS AND COMPLICATIONS Take your time so you do not get dizzy or light-headed. If you are in pain, you may need to take or ask for pain medication before doing incentive spirometry. It is harder to take a deep breath if you are having pain. AFTER USE Rest and breathe slowly  and easily. It can be helpful to keep track of a log of your progress. Your caregiver can provide you with a simple table to help with this. If you are using the spirometer at home, follow these instructions: SEEK MEDICAL CARE IF:  You are having difficultly using the spirometer. You have trouble using the spirometer as often as instructed. Your pain medication is not giving enough relief while using the spirometer. You develop fever of 100.5 F (38.1 C) or higher. SEEK IMMEDIATE MEDICAL CARE IF:  You cough up bloody sputum that had not been present before. You develop fever of 102 F (38.9 C) or greater. You develop worsening pain at or near the incision site. MAKE SURE YOU:  Understand these instructions. Will watch your condition. Will get help right away if you are not doing well or get worse. Document Released: 09/01/2006 Document Revised: 07/14/2011 Document Reviewed: 11/02/2006 [facility] Patient Information 2014 [place], [place].   ________________________________________________________________________

## 2023-04-10 ENCOUNTER — Telehealth: Payer: Self-pay | Admitting: Urology

## 2023-04-10 ENCOUNTER — Encounter (HOSPITAL_COMMUNITY)
Admission: RE | Admit: 2023-04-10 | Discharge: 2023-04-10 | Disposition: A | Discharge status: Home / Self Care | Payer: Commercial Managed Care - HMO | Source: Ambulatory Visit | Attending: Orthopaedic Surgery | Admitting: Orthopaedic Surgery

## 2023-04-10 ENCOUNTER — Encounter (HOSPITAL_COMMUNITY): Payer: Self-pay

## 2023-04-10 ENCOUNTER — Other Ambulatory Visit: Payer: Self-pay

## 2023-04-10 VITALS — BP 116/72 | HR 73 | Temp 98.7°F | Ht 66.0 in | Wt 228.0 lb

## 2023-04-10 DIAGNOSIS — Z01818 Encounter for other preprocedural examination: Secondary | ICD-10-CM | POA: Insufficient documentation

## 2023-04-10 DIAGNOSIS — E1159 Type 2 diabetes mellitus with other circulatory complications: Secondary | ICD-10-CM | POA: Diagnosis not present

## 2023-04-10 DIAGNOSIS — E785 Hyperlipidemia, unspecified: Secondary | ICD-10-CM | POA: Diagnosis not present

## 2023-04-10 DIAGNOSIS — I152 Hypertension secondary to endocrine disorders: Secondary | ICD-10-CM | POA: Insufficient documentation

## 2023-04-10 DIAGNOSIS — E1169 Type 2 diabetes mellitus with other specified complication: Secondary | ICD-10-CM | POA: Diagnosis not present

## 2023-04-10 DIAGNOSIS — M1712 Unilateral primary osteoarthritis, left knee: Secondary | ICD-10-CM | POA: Insufficient documentation

## 2023-04-10 DIAGNOSIS — R9431 Abnormal electrocardiogram [ECG] [EKG]: Secondary | ICD-10-CM | POA: Insufficient documentation

## 2023-04-10 HISTORY — DX: Cardiac murmur, unspecified: R01.1

## 2023-04-10 LAB — CBC
HCT: 39.2 % (ref 36.0–46.0)
Hemoglobin: 12.2 g/dL (ref 12.0–15.0)
MCH: 29.6 pg (ref 26.0–34.0)
MCHC: 31.1 g/dL (ref 30.0–36.0)
MCV: 95.1 fL (ref 80.0–100.0)
Platelets: 318 10*3/uL (ref 150–400)
RBC: 4.12 MIL/uL (ref 3.87–5.11)
RDW: 12.8 % (ref 11.5–15.5)
WBC: 6.3 10*3/uL (ref 4.0–10.5)
nRBC: 0 % (ref 0.0–0.2)

## 2023-04-10 LAB — HEMOGLOBIN A1C
Hgb A1c MFr Bld: 5.5 % (ref 4.8–5.6)
Mean Plasma Glucose: 111.15 mg/dL

## 2023-04-10 LAB — BASIC METABOLIC PANEL
Anion gap: 10 (ref 5–15)
BUN: 20 mg/dL (ref 6–20)
CO2: 27 mmol/L (ref 22–32)
Calcium: 9 mg/dL (ref 8.9–10.3)
Chloride: 105 mmol/L (ref 98–111)
Creatinine, Ser: 1.08 mg/dL — ABNORMAL HIGH (ref 0.44–1.00)
GFR, Estimated: 60 mL/min — ABNORMAL LOW (ref >60)
Glucose, Bld: 75 mg/dL (ref 70–99)
Potassium: 3.6 mmol/L (ref 3.5–5.1)
Sodium: 142 mmol/L (ref 135–145)

## 2023-04-10 LAB — SURGICAL PCR SCREEN
MRSA, PCR: NEGATIVE
Staphylococcus aureus: POSITIVE — AB

## 2023-04-10 LAB — GLUCOSE, CAPILLARY: Glucose-Capillary: 84 mg/dL (ref 70–99)

## 2023-04-10 MED ORDER — FLUCONAZOLE 150 MG PO TABS: 150.0000 mg | ORAL_TABLET | Freq: Every day | ORAL | 1 refills | Status: AC

## 2023-04-10 NOTE — Telephone Encounter (Signed)
Rx sent per Dr. Ronne Binning. Patient called with no answer.

## 2023-04-10 NOTE — Telephone Encounter (Signed)
Patient called she wants you to call her in something for yeast infection .  She was on antibiotics since last Monday please call into [facility] in [place]

## 2023-04-10 NOTE — Telephone Encounter (Signed)
Pt says she was given rx for uti and now she has a yeast infection.

## 2023-04-10 NOTE — Progress Notes (Addendum)
For Anesthesia: PCP - Ladora Daniel, PA-C . LOV: 12/02/22 Cardiologist - N/A  Bowel Prep reminder:  Chest x-ray -  EKG - 04/10/23 Stress Test -  ECHO - 07/29/11: [facility] Cardiac Cath -  Pacemaker/ICD device last checked: Pacemaker orders received: Device Rep notified:  Spinal Cord Stimulator:N/A  Sleep Study -  CPAP -   Fasting Blood Sugar - N/A Checks Blood Sugar ___0__ times a day Date and result of last Hgb A1c-  Last dose of GLP1 agonist- Trulicity GLP1 instructions: To hold after 04/09/23  Last dose of SGLT-2 inhibitors- N/A SGLT-2 instructions:   Blood Thinner Instructions: N/A Aspirin Instructions: Last Dose:  Activity level: Can go up a flight of stairs and activities of daily living without stopping and without chest pain and/or shortness of breath   Able to exercise without chest pain and/or shortness of breath  Anesthesia review: Hx: HTN,Murmur,COPD,CKD,DIA  Patient denies shortness of breath, fever, cough and chest pain at PAT appointment   Patient verbalized understanding of instructions that were given to them at the PAT appointment. Patient was also instructed that they will need to review over the PAT instructions again at home before surgery.

## 2023-04-13 ENCOUNTER — Telehealth: Payer: Self-pay | Admitting: Orthopaedic Surgery

## 2023-04-13 NOTE — Telephone Encounter (Signed)
Patient called advised she spoke with the pre-op nurse and was told she needed an appointment prior to surgery to discuss having the surgery on Friday. Patient asked should she have seen Dr. Magnus Ivan before she have surgery on Friday? The number to contact patient is [phone]

## 2023-04-13 NOTE — Progress Notes (Signed)
PCR: + STAPH °

## 2023-04-13 NOTE — Telephone Encounter (Signed)
Pt was called unable to reach Pt or leave voicemail.

## 2023-04-13 NOTE — Telephone Encounter (Signed)
Diflucan sent in 12/06

## 2023-04-14 ENCOUNTER — Telehealth: Payer: Self-pay | Admitting: Orthopaedic Surgery

## 2023-04-14 NOTE — Telephone Encounter (Signed)
I called and advised that no appt necessary before surgery on Friday.  If patient wanted to come in, I could work in Advertising account executive.  She could not come in tomorrow.  She will see Dr. Magnus Ivan before surgery on Friday.  Not sure why nurse suggested she needed an appt with Dr. Magnus Ivan or Bronson Curb before surgery.

## 2023-04-14 NOTE — Telephone Encounter (Signed)
Patient called advised she spoke with the pre-op nurse and was told she needed an appointment prior to surgery to discuss having the surgery on Friday. Patient asked should she have seen Dr. Magnus Ivan before she have surgery on Friday? The number to contact patient is [phone]

## 2023-04-16 NOTE — H&P (Signed)
TOTAL KNEE ADMISSION H&P  Patient is being admitted for left total knee arthroplasty.  Subjective:  Chief Complaint:left knee pain.  HPI: Molly Berry, 57 y.o. female, has a history of pain and functional disability in the left knee due to arthritis and has failed non-surgical conservative treatments for greater than 12 weeks to includeNSAID's and/or analgesics, corticosteriod injections, flexibility and strengthening excercises, weight reduction as appropriate, and activity modification.  Onset of symptoms was gradual, starting 10 years ago with gradually worsening course since that time. The patient noted no past surgery on the left knee(s).  Patient currently rates pain in the left knee(s) at 10 out of 10 with activity. Patient has night pain, worsening of pain with activity and weight bearing, pain that interferes with activities of daily living, pain with passive range of motion, crepitus, and joint swelling.  Patient has evidence of subchondral sclerosis, periarticular osteophytes, and joint space narrowing by imaging studies. There is no active infection.  Patient Active Problem List   Diagnosis Date Noted   Elevated d-dimer 02/07/2022   Obesity, Class III, BMI 40-49.9 (morbid obesity) (HCC) 02/06/2022   Hypoxia 02/06/2022   Opioid dependence (HCC) 02/06/2022   Osteoarthritis of knees, bilateral 12/02/2021   Pain in right wrist 11/07/2021   Pain in left wrist 11/07/2021   Complete tear of left rotator cuff 07/23/2021   Gastroesophageal reflux disease without esophagitis 10/27/2018   Type 2 diabetes mellitus with hyperlipidemia (HCC) 10/27/2018   Carpal tunnel syndrome, bilateral 08/26/2017   Moderate recurrent major depression (HCC) 12/10/2016   Chronic pain syndrome 09/19/2016   History of uterine cancer 07/16/2016   Bee sting allergy 07/07/2016   Attention deficit disorder (ADD) in adult 07/07/2016   Chronic insomnia 07/07/2016   Vitamin D deficiency 07/07/2016   Migraine  syndrome 07/07/2016   Fibromyalgia syndrome 07/07/2016   HLD (hyperlipidemia) 07/07/2016   Morbid obesity due to excess calories (HCC) 03/10/2015   Controlled type 2 diabetes mellitus without complication, without long-term current use of insulin (HCC) 03/09/2015   Nontoxic multinodular goiter 03/09/2015   Essential hypertension, benign 03/09/2015   Hypertension associated with diabetes (HCC) 03/09/2015   H/O gastric bypass 05/09/2014   IDA (iron deficiency anemia) 12/27/2013   Calculus of kidney 09/17/2011   Past Medical History:  Diagnosis Date   Allergy    Anemia    Anxiety    Arthritis    knees, multiple joints   Asthma    Bell palsy    Blood transfusion without reported diagnosis    Breast mass    lt breast mass x's 3 years increased in size   Chronic kidney disease    Chronic pain syndrome    COPD (chronic obstructive pulmonary disease) (HCC)    Depression    Diabetes mellitus    Endometrial cancer (HCC)    Fibromyalgia    GERD (gastroesophageal reflux disease)    H/O gastric bypass 05/09/2014   At [facility]   Headache    mirgraines   Heart murmur    Hyperlipidemia    Hypertension    Hypothyroidism    Multiple pulmonary nodules 11/30/2012   Followed in Pulmonary clinic/ [facility]/ [place]  - See CT abd  10/29/12  New right lower lobe pulmonary nodularity, primarily ground-  glass in density. This could reflect an inflammatory process,  although follow-up is necessary to exclude atypical neoplasm. Full  chest CT should be considered to evaluate for other pulmonary  findings.      Multiple thyroid  nodules    Nephropathy    Pneumonia    PONV (postoperative nausea and vomiting)    Pulmonary nodules    Thyroid disease    Ulcer    Uterine cancer (HCC) 12/26/2011   Stage 1, grade 1, S/P hysterectomy initially by robotic technique and then salpingo[facility]oophorectomy at [facility] receiving no postoperative treatment.    Past Surgical History:  Procedure Laterality Date    ABDOMINAL HYSTERECTOMY     endometrial cancer   CHOLECYSTECTOMY     COLONOSCOPY WITH PROPOFOL N/A 01/19/2014   SWN:[id number]   CYSTOSCOPY W/ URETERAL STENT PLACEMENT Bilateral 02/07/2022   Procedure: CYSTOSCOPY WITH RETROGRADE PYELOGRAM/RIGHT URETERAL STENT PLACEMENT WITH POSSIBLE LEFT;  Surgeon: Jannifer Hick, MD;  Location: [facility];  Service: Urology;  Laterality: Bilateral;   CYSTOSCOPY WITH RETROGRADE PYELOGRAM, URETEROSCOPY AND STENT PLACEMENT Bilateral 03/06/2022   Procedure: CYSTOSCOPY WITH RETROGRADE PYELOGRAM, URETEROSCOPY AND STENT EXCHANGE;  Surgeon: Malen Gauze, MD;  Location: [facility];  Service: Urology;  Laterality: Bilateral;   DILATION AND CURETTAGE OF UTERUS  12 yrs ago   DILATION AND CURETTAGE OF UTERUS  06/18/2011   Procedure: DILATATION AND CURETTAGE;  Surgeon: Lazaro Arms, MD;  Location: [facility];  Service: Gynecology;  Laterality: N/A;  Suction Dilation and Curettage   ESOPHAGOGASTRODUODENOSCOPY N/A 10/12/2013   Dr. Rourk:anastomotic ulcer likely cause of bleeding. likely ischemic    ESOPHAGOGASTRODUODENOSCOPY (EGD) WITH PROPOFOL N/A 01/19/2014   GHW:[id number]   EXTRACORPOREAL SHOCK WAVE LITHOTRIPSY Left 01/11/2018   Procedure: LEFT EXTRACORPOREAL SHOCK WAVE LITHOTRIPSY (ESWL);  Surgeon: Crista Elliot, MD;  Location: [facility];  Service: Urology;  Laterality: Left;   EXTRACORPOREAL SHOCK WAVE LITHOTRIPSY Left 01/14/2018   Procedure: LEFT EXTRACORPOREAL SHOCK WAVE LITHOTRIPSY (ESWL);  Surgeon: Malen Gauze, MD;  Location: [facility];  Service: Urology;  Laterality: Left;  75 MINS  W/ MAC   GASTRIC BYPASS  2013   [facility]   HOLMIUM LASER APPLICATION Bilateral 03/06/2022   Procedure: HOLMIUM LASER APPLICATION;  Surgeon: Malen Gauze, MD;  Location: [facility];  Service: Urology;  Laterality: Bilateral;   HYSTEROSCOPY WITH D & C  06/18/2011   Procedure: DILATATION AND CURETTAGE /HYSTEROSCOPY;  Surgeon: Lazaro Arms, MD;  Location: [facility];  Service: Gynecology;   Laterality: N/A;   LITHOTRIPSY     paniculectomy     SHOULDER ARTHROSCOPY WITH SUBACROMIAL DECOMPRESSION AND OPEN ROTATOR C Left 07/16/2021   Procedure: LEFT ARTHROSCOPIC SUBACROMIAL DECOMPRESSION, MINI OPEN ROTATOR CUFF TEAR REPAIR, BICEPS TENOTOMY;  Surgeon: Valeria Batman, MD;  Location: [facility];  Service: Orthopedics;  Laterality: Left;   STONE EXTRACTION WITH BASKET Bilateral 03/06/2022   Procedure: STONE EXTRACTION WITH BASKET;  Surgeon: Malen Gauze, MD;  Location: [facility];  Service: Urology;  Laterality: Bilateral;   TRIGGER FINGER RELEASE     WISDOM TOOTH EXTRACTION      No current facility-administered medications for this encounter.   Current Outpatient Medications  Medication Sig Dispense Refill Last Dose/Taking   albuterol (PROVENTIL HFA;VENTOLIN HFA) 108 (90 BASE) MCG/ACT inhaler Inhale 2 puffs into the lungs every 6 (six) hours as needed for shortness of breath or wheezing.   Taking As Needed   ALPRAZolam (XANAX) 1 MG tablet Take 1 mg by mouth 2 (two) times daily as needed for anxiety or sleep.   Taking As Needed   cholecalciferol (VITAMIN D3) 25 MCG (1000 UNIT) tablet Take 1,000 Units by mouth daily.   Taking   Dextromethorphan-Bupropion (AUVELITY PO) Take 1 tablet  by mouth daily.   Taking   dicyclomine (BENTYL) 20 MG tablet Take 20 mg by mouth as needed for spasms.   Taking As Needed   diphenhydrAMINE (BENADRYL) 25 mg capsule Take 50 mg by mouth at bedtime.   Taking   esomeprazole (NEXIUM) 40 MG capsule Take 40 mg by mouth daily.   Taking   famotidine (PEPCID) 40 MG tablet Take 40 mg by mouth at bedtime.   Taking   furosemide (LASIX) 20 MG tablet Take 20 mg by mouth daily as needed for fluid or edema.   Taking As Needed   gabapentin (NEURONTIN) 600 MG tablet Take 1,200 mg by mouth 2 (two) times daily.   Taking   indapamide (LOZOL) 2.5 MG tablet take 1 tablet by mouth once daily. 30 tablet 0 Taking   lamoTRIgine (LAMICTAL) 200 MG tablet Take 200 mg by mouth 2 (two)  times daily.   Taking   methocarbamol (ROBAXIN) 750 MG tablet Take 750 mg by mouth as needed for muscle spasms.   Taking As Needed   metoprolol succinate (TOPROL-XL) 50 MG 24 hr tablet Take 50 mg by mouth daily.   Taking   nitrofurantoin, macrocrystal-monohydrate, (MACROBID) 100 MG capsule Take 1 capsule (100 mg total) by mouth every 12 (twelve) hours. 14 capsule 0 Taking   ondansetron (ZOFRAN) 8 MG tablet Take 1 tablet (8 mg total) by mouth every 8 (eight) hours as needed for nausea or vomiting. (Patient taking differently: Take 8 mg by mouth as needed for nausea or vomiting.) 30 tablet 1 Taking Differently   oxyCODONE-acetaminophen (PERCOCET) 7.5-325 MG tablet Take 1 tablet by mouth every 6 (six) hours as needed. (Patient taking differently: Take 1 tablet by mouth every 6 (six) hours as needed for moderate pain (pain score 4-6) or severe pain (pain score 7-10).) 120 tablet 0 Taking Differently   simvastatin (ZOCOR) 20 MG tablet Take 20 mg by mouth at bedtime.  1 Taking   SUMAtriptan (IMITREX) 100 MG tablet Take 100 mg by mouth every 2 (two) hours as needed for migraine.   Taking As Needed   tiZANidine (ZANAFLEX) 2 MG tablet Take 4 mg by mouth at bedtime.   Taking   TRULICITY 3 MG/0.5ML SOAJ Inject 3 mg into the skin once a week. Every Tuesday   Taking   Vitamin D, Ergocalciferol, (DRISDOL) 1.25 MG (50000 UNIT) CAPS capsule Take 50,000 Units by mouth 2 (two) times a week.   Taking   buPROPion (WELLBUTRIN SR) 150 MG 12 hr tablet Take 150 mg by mouth 2 (two) times daily. (Patient not taking: Reported on 04/10/2023)   Not Taking   Allergies  Allergen Reactions   Bee Venom Shortness Of Breath, Swelling and Rash   Cephalexin Anaphylaxis, Itching and Swelling   Ciprofloxacin Hives, Swelling and Rash   Penicillins Hives, Itching, Swelling and Rash   Vancomycin Anaphylaxis, Hives and Itching    Has taken for bronchitis under medical supervision on site with benadryl, Red man syndrome, gave benadryl to  help, per RN.  Slow down infusion with future dose.  Flushing   Carbamazepine Other (See Comments)    Loss of balance, trembling, inability to think, resulting in ER visit shortly after start of course Speech, mind slows down, cant walk   Cefotaxime Rash   Sulfacetamide Sodium Rash   Adhesive [Tape] Rash   Clindamycin Rash   Doxycycline Diarrhea and Nausea And Vomiting   Latex Rash   Sulfa Antibiotics Rash    Social History  Tobacco Use   Smoking status: Never   Smokeless tobacco: Never  Substance Use Topics   Alcohol use: No    Alcohol/week: 0.0 standard drinks of alcohol    Family History  Problem Relation Age of Onset   Pneumonia Mother        Deceased   Arthritis Mother    Asthma Mother    Cancer Mother        pancreatic   COPD Mother    Depression Mother    Diabetes Mother    Kidney disease Mother    Liver cancer Father        Living   Arthritis Father    Cancer Father        liver, prostate   COPD Father    Depression Father    Stroke Father    Heart disease Maternal Grandmother    Mental illness Maternal Grandmother    Breast cancer Maternal Grandmother    Alcohol abuse Paternal Grandfather    Breast cancer Paternal Aunt    Anesthesia problems Neg Hx    Hypotension Neg Hx    Malignant hyperthermia Neg Hx    Pseudochol deficiency Neg Hx    Colon cancer Neg Hx      Review of Systems  Objective:  Physical Exam Vitals reviewed.  Constitutional:      Appearance: Normal appearance. She is obese.  HENT:     Head: Normocephalic and atraumatic.  Eyes:     Extraocular Movements: Extraocular movements intact.     Pupils: Pupils are equal, round, and reactive to light.  Cardiovascular:     Rate and Rhythm: Normal rate and regular rhythm.     Pulses: Normal pulses.  Pulmonary:     Effort: Pulmonary effort is normal.     Breath sounds: Normal breath sounds.  Abdominal:     Palpations: Abdomen is soft.  Musculoskeletal:     Cervical back: Normal  range of motion and neck supple.     Left knee: Effusion, bony tenderness and crepitus present. Decreased range of motion. Tenderness present over the medial joint line and lateral joint line. Abnormal alignment.  Neurological:     Mental Status: She is alert and oriented to person, place, and time.  Psychiatric:        Behavior: Behavior normal.     Vital signs in last 24 hours:    Labs:   Estimated body mass index is 36.8 kg/m as calculated from the following:   Height as of 04/10/23: 5\' 6"  (1.676 m).   Weight as of 04/10/23: 103.4 kg.   Imaging Review Plain radiographs demonstrate severe degenerative joint disease of the left knee(s). The overall alignment ismild varus. The bone quality appears to be excellent for age and reported activity level.      Assessment/Plan:  End stage arthritis, left knee   The patient history, physical examination, clinical judgment of the provider and imaging studies are consistent with end stage degenerative joint disease of the left knee(s) and total knee arthroplasty is deemed medically necessary. The treatment options including medical management, injection therapy arthroscopy and arthroplasty were discussed at length. The risks and benefits of total knee arthroplasty were presented and reviewed. The risks due to aseptic loosening, infection, stiffness, patella tracking problems, thromboembolic complications and other imponderables were discussed. The patient acknowledged the explanation, agreed to proceed with the plan and consent was signed. Patient is being admitted for inpatient treatment for surgery, pain control, PT, OT, prophylactic antibiotics, VTE  prophylaxis, progressive ambulation and ADL's and discharge planning. The patient is planning to be discharged home with home health services

## 2023-04-17 ENCOUNTER — Observation Stay (HOSPITAL_COMMUNITY)
Admission: RE | Admit: 2023-04-17 | Discharge: 2023-04-20 | Disposition: A | Discharge status: Home / Self Care | Payer: Commercial Managed Care - HMO | Attending: Orthopaedic Surgery | Admitting: Orthopaedic Surgery

## 2023-04-17 ENCOUNTER — Encounter (HOSPITAL_COMMUNITY)
Admission: RE | Disposition: A | Discharge status: Home / Self Care | Payer: Self-pay | Source: Home / Self Care | Attending: Orthopaedic Surgery

## 2023-04-17 ENCOUNTER — Ambulatory Visit (HOSPITAL_COMMUNITY): Payer: Commercial Managed Care - HMO | Admitting: Anesthesiology

## 2023-04-17 ENCOUNTER — Encounter (HOSPITAL_COMMUNITY): Payer: Self-pay | Admitting: Orthopaedic Surgery

## 2023-04-17 ENCOUNTER — Observation Stay (HOSPITAL_COMMUNITY): Payer: Commercial Managed Care - HMO

## 2023-04-17 ENCOUNTER — Other Ambulatory Visit: Payer: Self-pay

## 2023-04-17 DIAGNOSIS — E1122 Type 2 diabetes mellitus with diabetic chronic kidney disease: Secondary | ICD-10-CM | POA: Insufficient documentation

## 2023-04-17 DIAGNOSIS — Z96652 Presence of left artificial knee joint: Secondary | ICD-10-CM

## 2023-04-17 DIAGNOSIS — M1712 Unilateral primary osteoarthritis, left knee: Secondary | ICD-10-CM

## 2023-04-17 DIAGNOSIS — E039 Hypothyroidism, unspecified: Secondary | ICD-10-CM | POA: Diagnosis not present

## 2023-04-17 DIAGNOSIS — Z91040 Latex allergy status: Secondary | ICD-10-CM | POA: Diagnosis not present

## 2023-04-17 DIAGNOSIS — J449 Chronic obstructive pulmonary disease, unspecified: Secondary | ICD-10-CM | POA: Insufficient documentation

## 2023-04-17 DIAGNOSIS — N189 Chronic kidney disease, unspecified: Secondary | ICD-10-CM | POA: Insufficient documentation

## 2023-04-17 DIAGNOSIS — Z7985 Long-term (current) use of injectable non-insulin antidiabetic drugs: Secondary | ICD-10-CM | POA: Diagnosis not present

## 2023-04-17 DIAGNOSIS — Z8542 Personal history of malignant neoplasm of other parts of uterus: Secondary | ICD-10-CM | POA: Insufficient documentation

## 2023-04-17 DIAGNOSIS — Z79899 Other long term (current) drug therapy: Secondary | ICD-10-CM | POA: Insufficient documentation

## 2023-04-17 DIAGNOSIS — I129 Hypertensive chronic kidney disease with stage 1 through stage 4 chronic kidney disease, or unspecified chronic kidney disease: Secondary | ICD-10-CM | POA: Insufficient documentation

## 2023-04-17 HISTORY — PX: TOTAL KNEE ARTHROPLASTY: SHX125

## 2023-04-17 LAB — GLUCOSE, CAPILLARY: Glucose-Capillary: 88 mg/dL (ref 70–99)

## 2023-04-17 SURGERY — ARTHROPLASTY, KNEE, TOTAL
Anesthesia: Spinal | Site: Knee | Laterality: Left

## 2023-04-17 MED ORDER — MIDAZOLAM HCL 2 MG/2ML IJ SOLN
1.0000 mg | INTRAMUSCULAR | Status: DC
  Filled 2023-04-17: qty 2

## 2023-04-17 MED ORDER — PANTOPRAZOLE SODIUM 40 MG PO TBEC
40.0000 mg | DELAYED_RELEASE_TABLET | Freq: Every day | ORAL | Status: DC
  Administered 2023-04-17 – 2023-04-20 (×4): 40 mg via ORAL
  Filled 2023-04-17 (×4): qty 1

## 2023-04-17 MED ORDER — OXYCODONE HCL 5 MG/5ML PO SOLN: 5.0000 mg | Freq: Once | ORAL | Status: DC | PRN

## 2023-04-17 MED ORDER — DIPHENHYDRAMINE HCL 50 MG/ML IJ SOLN: 25.0000 mg | Freq: Once | INTRAMUSCULAR | Status: DC

## 2023-04-17 MED ORDER — ONDANSETRON HCL 4 MG/2ML IJ SOLN: 4.0000 mg | Freq: Four times a day (QID) | INTRAMUSCULAR | Status: DC | PRN

## 2023-04-17 MED ORDER — POLYETHYLENE GLYCOL 3350 17 G PO PACK
17.0000 g | PACK | Freq: Every day | ORAL | Status: DC | PRN
Start: 2023-04-17 — End: 2023-04-20

## 2023-04-17 MED ORDER — NITROFURANTOIN MONOHYD MACRO 100 MG PO CAPS
100.0000 mg | ORAL_CAPSULE | Freq: Two times a day (BID) | ORAL | Status: DC
Start: 2023-04-17 — End: 2023-04-20
  Administered 2023-04-17 – 2023-04-20 (×6): 100 mg via ORAL
  Filled 2023-04-17 (×6): qty 1

## 2023-04-17 MED ORDER — ONDANSETRON HCL 4 MG/2ML IJ SOLN
INTRAMUSCULAR | Status: AC
Start: 2023-04-17 — End: ?
  Filled 2023-04-17: qty 2

## 2023-04-17 MED ORDER — ACETAMINOPHEN 500 MG PO TABS: 1000.0000 mg | ORAL_TABLET | Freq: Once | ORAL | Status: AC

## 2023-04-17 MED ORDER — HYDROMORPHONE HCL 1 MG/ML IJ SOLN: 0.2500 mg | INTRAMUSCULAR | Status: DC | PRN

## 2023-04-17 MED ORDER — DEXAMETHASONE SODIUM PHOSPHATE 4 MG/ML IJ SOLN
INTRAMUSCULAR | Status: DC | PRN
  Administered 2023-04-17: 5 mg via PERINEURAL

## 2023-04-17 MED ORDER — MIDAZOLAM HCL 2 MG/2ML IJ SOLN
INTRAMUSCULAR | Status: AC
  Filled 2023-04-17: qty 2

## 2023-04-17 MED ORDER — OXYCODONE HCL 5 MG PO TABS: 5.0000 mg | ORAL_TABLET | Freq: Once | ORAL | Status: DC | PRN

## 2023-04-17 MED ORDER — OXYCODONE HCL 5 MG PO TABS
5.0000 mg | ORAL_TABLET | ORAL | Status: DC | PRN
  Administered 2023-04-17 – 2023-04-20 (×7): 10 mg via ORAL
  Filled 2023-04-17 (×7): qty 2

## 2023-04-17 MED ORDER — METOCLOPRAMIDE HCL 5 MG PO TABS
5.0000 mg | ORAL_TABLET | Freq: Three times a day (TID) | ORAL | Status: DC | PRN
Start: 2023-04-17 — End: 2023-04-20

## 2023-04-17 MED ORDER — ACETAMINOPHEN 325 MG PO TABS
325.0000 mg | ORAL_TABLET | Freq: Four times a day (QID) | ORAL | Status: DC | PRN
Start: 2023-04-18 — End: 2023-04-20
  Administered 2023-04-20: 650 mg via ORAL
  Filled 2023-04-17 (×2): qty 2

## 2023-04-17 MED ORDER — DEXAMETHASONE SODIUM PHOSPHATE 10 MG/ML IJ SOLN
INTRAMUSCULAR | Status: AC
  Filled 2023-04-17: qty 1

## 2023-04-17 MED ORDER — ONDANSETRON HCL 4 MG PO TABS: 4.0000 mg | ORAL_TABLET | Freq: Four times a day (QID) | ORAL | Status: DC | PRN

## 2023-04-17 MED ORDER — ALUM & MAG HYDROXIDE-SIMETH 200-200-20 MG/5ML PO SUSP
30.0000 mL | ORAL | Status: DC | PRN
  Administered 2023-04-19: 30 mL via ORAL
  Filled 2023-04-17: qty 30

## 2023-04-17 MED ORDER — FUROSEMIDE 20 MG PO TABS: 20.0000 mg | ORAL_TABLET | Freq: Every day | ORAL | Status: DC | PRN

## 2023-04-17 MED ORDER — INDAPAMIDE 1.25 MG PO TABS
2.5000 mg | ORAL_TABLET | Freq: Every day | ORAL | Status: DC
  Administered 2023-04-18 – 2023-04-20 (×3): 2.5 mg via ORAL
  Filled 2023-04-17 (×4): qty 2

## 2023-04-17 MED ORDER — 0.9 % SODIUM CHLORIDE (POUR BTL) OPTIME
TOPICAL | Status: DC | PRN
  Administered 2023-04-17: 1000 mL

## 2023-04-17 MED ORDER — MIDAZOLAM HCL 5 MG/5ML IJ SOLN
INTRAMUSCULAR | Status: DC | PRN
  Administered 2023-04-17 (×2): .5 mg via INTRAVENOUS

## 2023-04-17 MED ORDER — STERILE WATER FOR IRRIGATION IR SOLN
Status: DC | PRN
Start: 2023-04-17 — End: 2023-04-17
  Administered 2023-04-17: 1000 mL

## 2023-04-17 MED ORDER — PROPOFOL 500 MG/50ML IV EMUL
INTRAVENOUS | Status: DC | PRN
  Administered 2023-04-17: 75 ug/kg/min via INTRAVENOUS

## 2023-04-17 MED ORDER — FAMOTIDINE 20 MG PO TABS
40.0000 mg | ORAL_TABLET | Freq: Every day | ORAL | Status: DC
  Administered 2023-04-17 – 2023-04-19 (×3): 40 mg via ORAL
  Filled 2023-04-17 (×3): qty 2

## 2023-04-17 MED ORDER — LACTATED RINGERS IV SOLN: INTRAVENOUS | Status: DC

## 2023-04-17 MED ORDER — ROPIVACAINE HCL 5 MG/ML IJ SOLN
INTRAMUSCULAR | Status: DC | PRN
  Administered 2023-04-17: 30 mL via PERINEURAL

## 2023-04-17 MED ORDER — GENTAMICIN SULFATE 40 MG/ML IJ SOLN
5.0000 mg/kg | INTRAVENOUS | Status: AC
  Administered 2023-04-17: 380 mg via INTRAVENOUS
  Filled 2023-04-17: qty 9.5

## 2023-04-17 MED ORDER — LIDOCAINE HCL (PF) 2 % IJ SOLN
INTRAMUSCULAR | Status: AC
  Filled 2023-04-17: qty 5

## 2023-04-17 MED ORDER — HYDROMORPHONE HCL 1 MG/ML IJ SOLN
0.5000 mg | INTRAMUSCULAR | Status: DC | PRN
  Administered 2023-04-17 – 2023-04-19 (×8): 1 mg via INTRAVENOUS
  Filled 2023-04-17 (×8): qty 1

## 2023-04-17 MED ORDER — VITAMIN D 25 MCG (1000 UNIT) PO TABS
1000.0000 [IU] | ORAL_TABLET | Freq: Every day | ORAL | Status: DC
  Administered 2023-04-18 – 2023-04-20 (×3): 1000 [IU] via ORAL
  Filled 2023-04-17 (×3): qty 1

## 2023-04-17 MED ORDER — POVIDONE-IODINE 10 % EX SWAB: 2.0000 | Freq: Once | CUTANEOUS | Status: DC

## 2023-04-17 MED ORDER — HYDROMORPHONE HCL 2 MG PO TABS
2.0000 mg | ORAL_TABLET | ORAL | Status: DC | PRN
  Administered 2023-04-17 – 2023-04-19 (×6): 3 mg via ORAL
  Administered 2023-04-19: 2 mg via ORAL
  Administered 2023-04-20 (×3): 3 mg via ORAL
  Filled 2023-04-17 (×5): qty 2
  Filled 2023-04-17: qty 1
  Filled 2023-04-17 (×7): qty 2

## 2023-04-17 MED ORDER — BUPIVACAINE IN DEXTROSE 0.75-8.25 % IT SOLN
INTRATHECAL | Status: DC | PRN
  Administered 2023-04-17: 1.7 mL via INTRATHECAL

## 2023-04-17 MED ORDER — INSULIN ASPART 100 UNIT/ML IJ SOLN: 0.0000 [IU] | INTRAMUSCULAR | Status: DC | PRN

## 2023-04-17 MED ORDER — ONDANSETRON HCL 4 MG/2ML IJ SOLN
INTRAMUSCULAR | Status: DC | PRN
  Administered 2023-04-17: 4 mg via INTRAVENOUS

## 2023-04-17 MED ORDER — KETOROLAC TROMETHAMINE 15 MG/ML IJ SOLN
7.5000 mg | Freq: Once | INTRAMUSCULAR | Status: AC
  Administered 2023-04-17: 7.5 mg via INTRAVENOUS
  Filled 2023-04-17: qty 1

## 2023-04-17 MED ORDER — ORAL CARE MOUTH RINSE: 15.0000 mL | Freq: Once | OROMUCOSAL | Status: DC

## 2023-04-17 MED ORDER — ACETAMINOPHEN 500 MG PO TABS
ORAL_TABLET | ORAL | Status: AC
  Administered 2023-04-17: 1000 mg via ORAL
  Filled 2023-04-17: qty 2

## 2023-04-17 MED ORDER — LAMOTRIGINE 100 MG PO TABS
200.0000 mg | ORAL_TABLET | Freq: Two times a day (BID) | ORAL | Status: DC
  Administered 2023-04-17 – 2023-04-20 (×6): 200 mg via ORAL
  Filled 2023-04-17 (×6): qty 2

## 2023-04-17 MED ORDER — SIMVASTATIN 20 MG PO TABS
20.0000 mg | ORAL_TABLET | Freq: Every day | ORAL | Status: DC
  Administered 2023-04-17 – 2023-04-19 (×3): 20 mg via ORAL
  Filled 2023-04-17 (×4): qty 1

## 2023-04-17 MED ORDER — BUPIVACAINE-EPINEPHRINE 0.25% -1:200000 IJ SOLN
INTRAMUSCULAR | Status: AC
  Filled 2023-04-17: qty 1

## 2023-04-17 MED ORDER — DIPHENHYDRAMINE HCL 12.5 MG/5ML PO ELIX: 12.5000 mg | ORAL_SOLUTION | ORAL | Status: DC | PRN

## 2023-04-17 MED ORDER — TRANEXAMIC ACID-NACL 1000-0.7 MG/100ML-% IV SOLN
1000.0000 mg | INTRAVENOUS | Status: AC
  Administered 2023-04-17: 1000 mg via INTRAVENOUS
  Filled 2023-04-17: qty 100

## 2023-04-17 MED ORDER — ALBUTEROL SULFATE (2.5 MG/3ML) 0.083% IN NEBU: 3.0000 mL | INHALATION_SOLUTION | Freq: Four times a day (QID) | RESPIRATORY_TRACT | Status: DC | PRN

## 2023-04-17 MED ORDER — VANCOMYCIN HCL 1.5 G IV SOLR: 1500.0000 mg | Freq: Once | INTRAVENOUS | Status: DC

## 2023-04-17 MED ORDER — DOCUSATE SODIUM 100 MG PO CAPS
100.0000 mg | ORAL_CAPSULE | Freq: Two times a day (BID) | ORAL | Status: DC
  Administered 2023-04-17 – 2023-04-20 (×6): 100 mg via ORAL
  Filled 2023-04-17 (×6): qty 1

## 2023-04-17 MED ORDER — CLONIDINE HCL (ANALGESIA) 100 MCG/ML EP SOLN
EPIDURAL | Status: DC | PRN
  Administered 2023-04-17: 80 ug

## 2023-04-17 MED ORDER — VANCOMYCIN HCL 1.5 G IV SOLR
1500.0000 mg | Freq: Once | INTRAVENOUS | Status: DC
  Filled 2023-04-17: qty 30

## 2023-04-17 MED ORDER — ALPRAZOLAM 0.5 MG PO TABS
1.0000 mg | ORAL_TABLET | Freq: Two times a day (BID) | ORAL | Status: DC | PRN
  Administered 2023-04-17 – 2023-04-19 (×3): 1 mg via ORAL
  Filled 2023-04-17 (×4): qty 2

## 2023-04-17 MED ORDER — ASPIRIN 81 MG PO CHEW
81.0000 mg | CHEWABLE_TABLET | Freq: Two times a day (BID) | ORAL | Status: DC
  Administered 2023-04-17 – 2023-04-20 (×6): 81 mg via ORAL
  Filled 2023-04-17 (×6): qty 1

## 2023-04-17 MED ORDER — METOCLOPRAMIDE HCL 5 MG/ML IJ SOLN: 5.0000 mg | Freq: Three times a day (TID) | INTRAMUSCULAR | Status: DC | PRN

## 2023-04-17 MED ORDER — SODIUM CHLORIDE 0.9 % IV SOLN: INTRAVENOUS | Status: AC

## 2023-04-17 MED ORDER — FENTANYL CITRATE PF 50 MCG/ML IJ SOSY
50.0000 ug | PREFILLED_SYRINGE | INTRAMUSCULAR | Status: AC
  Administered 2023-04-17: 50 ug via INTRAVENOUS
  Filled 2023-04-17: qty 2

## 2023-04-17 MED ORDER — DEXAMETHASONE SODIUM PHOSPHATE 4 MG/ML IJ SOLN
INTRAMUSCULAR | Status: DC | PRN
  Administered 2023-04-17: 8 mg via INTRAVENOUS

## 2023-04-17 MED ORDER — SODIUM CHLORIDE 0.9 % IR SOLN
Status: DC | PRN
  Administered 2023-04-17: 1000 mL

## 2023-04-17 MED ORDER — PHENOL 1.4 % MT LIQD: 1.0000 | OROMUCOSAL | Status: DC | PRN

## 2023-04-17 MED ORDER — GABAPENTIN 400 MG PO CAPS
1200.0000 mg | ORAL_CAPSULE | Freq: Two times a day (BID) | ORAL | Status: DC
  Administered 2023-04-17 – 2023-04-20 (×6): 1200 mg via ORAL
  Filled 2023-04-17 (×6): qty 3

## 2023-04-17 MED ORDER — FENTANYL CITRATE (PF) 100 MCG/2ML IJ SOLN
INTRAMUSCULAR | Status: AC
  Filled 2023-04-17: qty 2

## 2023-04-17 MED ORDER — METOPROLOL SUCCINATE ER 50 MG PO TB24
50.0000 mg | ORAL_TABLET | Freq: Every day | ORAL | Status: DC
  Administered 2023-04-18 – 2023-04-19 (×2): 50 mg via ORAL
  Filled 2023-04-17 (×3): qty 1

## 2023-04-17 MED ORDER — METHOCARBAMOL 500 MG PO TABS
750.0000 mg | ORAL_TABLET | Freq: Four times a day (QID) | ORAL | Status: DC | PRN
  Administered 2023-04-17 – 2023-04-18 (×3): 750 mg via ORAL
  Filled 2023-04-17 (×4): qty 2

## 2023-04-17 MED ORDER — PROPOFOL 1000 MG/100ML IV EMUL
INTRAVENOUS | Status: AC
  Filled 2023-04-17: qty 100

## 2023-04-17 MED ORDER — BUPIVACAINE-EPINEPHRINE 0.25% -1:200000 IJ SOLN
INTRAMUSCULAR | Status: DC | PRN
  Administered 2023-04-17: 30 mL

## 2023-04-17 MED ORDER — DROPERIDOL 2.5 MG/ML IJ SOLN: 0.6250 mg | Freq: Once | INTRAMUSCULAR | Status: DC | PRN

## 2023-04-17 MED ORDER — PROPOFOL 10 MG/ML IV BOLUS
INTRAVENOUS | Status: DC | PRN
  Administered 2023-04-17: 20 mg via INTRAVENOUS
  Administered 2023-04-17: 10 mg via INTRAVENOUS

## 2023-04-17 MED ORDER — MENTHOL 3 MG MT LOZG: 1.0000 | LOZENGE | OROMUCOSAL | Status: DC | PRN

## 2023-04-17 MED ORDER — PHENYLEPHRINE HCL (PRESSORS) 10 MG/ML IV SOLN
INTRAVENOUS | Status: DC | PRN
  Administered 2023-04-17 (×2): 80 ug via INTRAVENOUS

## 2023-04-17 MED ORDER — CHLORHEXIDINE GLUCONATE 0.12 % MT SOLN: 15.0000 mL | Freq: Once | OROMUCOSAL | Status: DC

## 2023-04-17 SURGICAL SUPPLY — 54 items
BAG COUNTER SPONGE SURGICOUNT (BAG) IMPLANT
BAG ZIPLOCK 12X15 (MISCELLANEOUS) ×1 IMPLANT
BENZOIN TINCTURE PRP APPL 2/3 (GAUZE/BANDAGES/DRESSINGS) IMPLANT
BLADE SAG 18X100X1.27 (BLADE) ×1 IMPLANT
BLADE SAW SGTL 13.0X1.19X90.0M (BLADE) IMPLANT
BLADE SURG SZ10 CARB STEEL (BLADE) ×2 IMPLANT
BNDG ELASTIC 6INX 5YD STR LF (GAUZE/BANDAGES/DRESSINGS) ×2 IMPLANT
BOWL SMART MIX CTS (DISPOSABLE) IMPLANT
CEMENT BONE R 1X40 (Cement) IMPLANT
COMP TIB PS KNEE D 0D LT (Joint) ×1 IMPLANT
COMPONET TIB PS KNEE D 0D LT (Joint) IMPLANT
COOLER ICEMAN CLASSIC (MISCELLANEOUS) ×1 IMPLANT
COVER SURGICAL LIGHT HANDLE (MISCELLANEOUS) ×1 IMPLANT
CUFF TRNQT CYL 34X4.125X (TOURNIQUET CUFF) ×1 IMPLANT
DRAPE INCISE IOBAN 66X45 STRL (DRAPES) ×1 IMPLANT
DRAPE U-SHAPE 47X51 STRL (DRAPES) ×1 IMPLANT
DURAPREP 26ML APPLICATOR (WOUND CARE) ×1 IMPLANT
ELECT BLADE TIP CTD 4 INCH (ELECTRODE) ×1 IMPLANT
ELECT REM PT RETURN 15FT ADLT (MISCELLANEOUS) ×1 IMPLANT
FEMUR CMT CR STD SZ 6 LT KNEE (Joint) ×1 IMPLANT
FEMUR CMTD CR STD SZ 6 LT KNEE (Joint) IMPLANT
GAUZE PAD ABD 8X10 STRL (GAUZE/BANDAGES/DRESSINGS) ×2 IMPLANT
GAUZE SPONGE 4X4 12PLY STRL (GAUZE/BANDAGES/DRESSINGS) ×1 IMPLANT
GAUZE XEROFORM 1X8 LF (GAUZE/BANDAGES/DRESSINGS) IMPLANT
GLOVE BIO SURGEON STRL SZ7.5 (GLOVE) ×1 IMPLANT
GLOVE BIOGEL PI IND STRL 8 (GLOVE) ×2 IMPLANT
GLOVE ECLIPSE 8.0 STRL XLNG CF (GLOVE) ×1 IMPLANT
GOWN STRL REUS W/ TWL XL LVL3 (GOWN DISPOSABLE) ×2 IMPLANT
HOLDER FOLEY CATH W/STRAP (MISCELLANEOUS) IMPLANT
IMMOBILIZER KNEE 20 (SOFTGOODS) ×1
IMMOBILIZER KNEE 20 THIGH 36 (SOFTGOODS) ×1 IMPLANT
INSERT TIB ASF 14 6-7/CD LT (Insert) IMPLANT
KIT TURNOVER KIT A (KITS) IMPLANT
NS IRRIG 1000ML POUR BTL (IV SOLUTION) ×1 IMPLANT
PACK TOTAL KNEE CUSTOM (KITS) ×1 IMPLANT
PAD COLD SHLDR WRAP-ON (PAD) ×1 IMPLANT
PAD ORTHO SHOULDER 7X19 LRG (SOFTGOODS) IMPLANT
PADDING CAST ABS COTTON 6X4 NS (CAST SUPPLIES) IMPLANT
PADDING CAST COTTON 6X4 STRL (CAST SUPPLIES) ×2 IMPLANT
PIN DRILL HDLS TROCAR 75 4PK (PIN) IMPLANT
PROTECTOR NERVE ULNAR (MISCELLANEOUS) ×1 IMPLANT
SCREW FEMALE HEX FIX 25X2.5 (ORTHOPEDIC DISPOSABLE SUPPLIES) IMPLANT
SET HNDPC FAN SPRY TIP SCT (DISPOSABLE) ×1 IMPLANT
SET PAD KNEE POSITIONER (MISCELLANEOUS) ×1 IMPLANT
SPIKE FLUID TRANSFER (MISCELLANEOUS) IMPLANT
STAPLER SKIN PROX WIDE 3.9 (STAPLE) IMPLANT
STEM POLY PAT PLY 29M KNEE (Knees) IMPLANT
STRIP CLOSURE SKIN 1/2X4 (GAUZE/BANDAGES/DRESSINGS) IMPLANT
SUT MNCRL AB 4-0 PS2 18 (SUTURE) IMPLANT
SUT VIC AB 0 CT1 27XBRD ANTBC (SUTURE) ×1 IMPLANT
SUT VIC AB 1 CT1 36 (SUTURE) ×2 IMPLANT
SUT VIC AB 2-0 CT1 TAPERPNT 27 (SUTURE) ×2 IMPLANT
TRAY FOLEY MTR SLVR 16FR STAT (SET/KITS/TRAYS/PACK) IMPLANT
WATER STERILE IRR 1000ML POUR (IV SOLUTION) ×2 IMPLANT

## 2023-04-17 NOTE — Progress Notes (Signed)
Pharmacy Antibiotic Note  Molly Berry is a 57 y.o. female admitted on 04/17/2023 for  left total knee arthroplasty.  Pharmacy has been consulted for vancomycin dosing for surgical prophylaxis Per Epic pt with "anaphylaxis, hives & itching to vancomycin, Has taken for bronchitis under medical supervision on site with benadryl, Red man syndrome, gave benadryl to help, per RN.  Slow down infusion with future dose.  Flushing   Plan:  vancomycin 1500 mg IV x 1 dose preop for surgical prophylaxis Instructions to RN> may need to slow rate of infusion Will premed with benadryl 25 mg IV x 1   Height: 5\' 6"  (167.6 cm) Weight: 103.4 kg (228 lb) IBW/kg (Calculated) : 59.3  Temp (24hrs), Avg:98.2 F (36.8 C), Min:98.2 F (36.8 C), Max:98.2 F (36.8 C)  Recent Labs  Lab 04/10/23 1118  WBC 6.3  CREATININE 1.08*    Estimated Creatinine Clearance: 69.8 mL/min (A) (by C-G formula based on SCr of 1.08 mg/dL (H)).    Allergies  Allergen Reactions   Bee Venom Shortness Of Breath, Swelling and Rash   Cephalexin Anaphylaxis, Itching and Swelling   Ciprofloxacin Hives, Swelling and Rash   Penicillins Hives, Itching, Swelling and Rash   Vancomycin Anaphylaxis, Hives and Itching    Has taken for bronchitis under medical supervision on site with benadryl, Red man syndrome, gave benadryl to help, per RN.  Slow down infusion with future dose.  Flushing   Carbamazepine Other (See Comments)    Loss of balance, trembling, inability to think, resulting in ER visit shortly after start of course Speech, mind slows down, cant walk   Cefotaxime Rash   Sulfacetamide Sodium Rash   Adhesive [Tape] Rash   Clindamycin Rash   Doxycycline Diarrhea and Nausea And Vomiting   Latex Rash   Sulfa Antibiotics Rash    Thank you for allowing pharmacy to be a part of this patient's care.  Herby Abraham, Pharm.D Use secure chat for questions 04/17/2023 9:57 AM

## 2023-04-17 NOTE — Anesthesia Procedure Notes (Signed)
Anesthesia Regional Block: Adductor canal block   Pre-Anesthetic Checklist: , timeout performed,  Correct Patient, Correct Site, Correct Laterality,  Correct Procedure, Correct Position, site marked,  Risks and benefits discussed,  Surgical consent,  Pre-op evaluation,  At surgeon's request and post-op pain management  Laterality: Lower and Left  Prep: chloraprep       Needles:  Injection technique: Single-shot  Needle Type: Stimiplex     Needle Length: 9cm  Needle Gauge: 21     Additional Needles:   Procedures:,,,, ultrasound used (permanent image in chart),,    Narrative:  Start time: 04/17/2023 9:56 AM End time: 04/17/2023 10:16 AM Injection made incrementally with aspirations every 5 mL.  Performed by: Personally  Anesthesiologist: Lewie Loron, MD  Additional Notes: BP cuff, EKG monitors applied. Sedation begun. Artery and nerve location verified with ultrasound. Anesthetic injected incrementally (5ml), slowly, and after negative aspirations under direct u/s guidance. Good fascial/perineural spread. Tolerated well.

## 2023-04-17 NOTE — Interval H&P Note (Signed)
History and Physical Interval Note: The patient understands that she is here today for a left total knee replacement to treat her severe left knee arthritis.  There has been no acute or interval change in her medical status.  The risks and benefits of surgery have been discussed in detail and informed consent has been obtained.  The left operative knee has been marked.  04/17/2023 9:53 AM  Molly Berry  has presented today for surgery, with the diagnosis of Osteoarthritis Left Knee.  The various methods of treatment have been discussed with the patient and family. After consideration of risks, benefits and other options for treatment, the patient has consented to  Procedure(s): LEFT TOTAL KNEE ARTHROPLASTY (Left) as a surgical intervention.  The patient's history has been reviewed, patient examined, no change in status, stable for surgery.  I have reviewed the patient's chart and labs.  Questions were answered to the patient's satisfaction.     [name]

## 2023-04-17 NOTE — TOC Transition Note (Signed)
Transition of Care [facility]) - Discharge Note   Patient Details  Name: Molly Berry MRN: [id number] Date of Birth: Aug 05, 1965  Transition of Care [facility]) CM/SW Contact:  Amada Jupiter, LCSW Phone Number: 04/17/2023, 3:37 PM   Clinical Narrative:     Met with pt and spouse who confirm need for RW and no DME agency preference - order placed with [employer] and delivered to room.  HHPT prearranged with [facility] via ortho MD office prior to surgery - pt aware/ agreeable.  No further TOC needs.  Final next level of care: Home w Home Health Services Barriers to Discharge: No Barriers Identified   Patient Goals and CMS Choice Patient states their goals for this hospitalization and ongoing recovery are:: return home          Discharge Placement                       Discharge Plan and Services Additional resources added to the After Visit Summary for                  DME Arranged: Walker rolling DME Agency: [employer] Date DME Agency Contacted: 04/17/23 Time DME Agency Contacted: [id number] Representative spoke with at DME Agency: Yvonna Alanis HH Arranged: PT HH Agency: [facility]        Social Drivers of Health (SDOH) Interventions SDOH Screenings   Food Insecurity: No Food Insecurity (06/20/2022)   Received from [facility], [facility]  Housing: Medium Risk (02/06/2022)  Transportation Needs: No Transportation Needs (06/20/2022)   Received from [facility], [facility]  Utilities: Not At Risk (06/20/2022)   Received from [facility], [facility]  Depression [phone]): Low Risk  (04/07/2023)  Financial Resource Strain: Low Risk  (06/20/2022)   Received from [facility], [facility]  Physical Activity: Inactive (03/19/2021)   Received from [facility], [facility]  Social Connections: Unknown (09/15/2021)   Received from [facility], [facility]  Stress: Stress Concern Present (03/19/2021)   Received from [facility], [facility]  Tobacco Use:  Low Risk  (04/17/2023)     Readmission Risk Interventions    02/20/2022   10:33 AM  Readmission Risk Prevention Plan  Transportation Screening Complete  HRI or Home Care Consult Complete  Social Work Consult for Recovery Care Planning/Counseling Complete  Palliative Care Screening Not Applicable  Medication Review Oceanographer) Complete

## 2023-04-17 NOTE — Anesthesia Preprocedure Evaluation (Addendum)
Anesthesia Evaluation  Patient identified by MRN, date of birth, ID band Patient awake    Reviewed: Allergy & Precautions, H&P , NPO status , Patient's Chart, lab work & pertinent test results  History of Anesthesia Complications (+) PONV and history of anesthetic complications (last few times 5 or 6 hours after the procedure she ended up in ER with SOB and chest pain)  Airway Mallampati: III  TM Distance: >3 FB Neck ROM: Full    Dental  (+) Edentulous Upper, Edentulous Lower, Dental Advisory Given   Pulmonary asthma , pneumonia, COPD,  COPD inhaler   Pulmonary exam normal breath sounds clear to auscultation       Cardiovascular Exercise Tolerance: Poor hypertension, Pt. on medications and Pt. on home beta blockers + Valvular Problems/Murmurs  Rhythm:Regular Rate:Normal  19-Feb-2022 09:05:16 [facility] ROUTINE RECORD 02/07/1966 (56 yr) Female Caucasian Vent. rate 72 BPM PR interval 179 ms QRS duration 90 ms QT/QTcB 396/434 ms P-R-T axes 44 -25 30 Sinus rhythm Borderline left axis deviation Low voltage, precordial leads Consider anterior infarct   Neuro/Psych  Headaches PSYCHIATRIC DISORDERS Anxiety Depression     Neuromuscular disease    GI/Hepatic Neg liver ROS,GERD  Medicated and Poorly Controlled,,  Endo/Other  diabetes, Well Controlled, Type 2, Oral Hypoglycemic AgentsHypothyroidism    Renal/GU Renal InsufficiencyRenal disease  Female GU complaint (endometrial cancer)     Musculoskeletal  (+) Arthritis , Osteoarthritis,  Fibromyalgia -, narcotic dependent  Abdominal  (+) + obese  Peds negative pediatric ROS (+)  Hematology  (+) Blood dyscrasia, anemia   Anesthesia Other Findings   Reproductive/Obstetrics negative OB ROS                             Anesthesia Physical Anesthesia Plan  ASA: 3  Anesthesia Plan: Spinal   Post-op Pain Management: Tylenol  PO (pre-op)*, Regional block* and Gabapentin PO (pre-op)*   Induction: Intravenous  PONV Risk Score and Plan: 4 or greater and Ondansetron, Dexamethasone and Treatment may vary due to age or medical condition  Airway Management Planned: Natural Airway  Additional Equipment:   Intra-op Plan:   Post-operative Plan:   Informed Consent: I have reviewed the patients History and Physical, chart, labs and discussed the procedure including the risks, benefits and alternatives for the proposed anesthesia with the patient or authorized representative who has indicated his/her understanding and acceptance.     Dental advisory given  Plan Discussed with: CRNA  Anesthesia Plan Comments: (Risks of anesthesia explained at length. This includes, but is not limited to, sore throat, damage to teeth, lips gums, tongue and vocal cords, nausea and vomiting, reactions to medications, stroke, heart attack, and death. All patient questions were answered and the patient wishes to proceed. Risks of peripheral nerve block explained at length. This includes, but is not limited to, bleeding, infection, reactions to the medications, seizures, damage to surrounding structures, damage to nerves, permanent weakness, numbness, tingling and pain. All patient questions were answered and patient wishes to proceed with nerve block. )       Anesthesia Quick Evaluation

## 2023-04-17 NOTE — Evaluation (Signed)
Physical Therapy Evaluation Patient Details Name: Molly Berry MRN: [id number] DOB: Dec 16, 1965 Today's Date: 04/17/2023  History of Present Illness  57 yo female presents to therapy s/p L TKA on 04/17/2023 due to failure of conservative measures. Pt PMH includes but is not limited to: hypoxia, opioid dependence, B UE pain, L RTC tear s/p arthroscopic repair, GERD, DM II, fibromyalgia and chronic pain syndrome, HLD, goiter, h/o gastric bypass, anemia, COPD, endometrial ca, depression, anxiety, HTN, pulmonary and thyroid nodules.  Clinical Impression      [name] is a 57 y.o. female POD 0 s/p L TKA. Patient reports IND with mobility at baseline. Patient is now limited by functional impairments (see PT problem list below) and requires CGA for bed mobility and min A for transfers. Patient was able to ambulate 20 feet with RW and CGA level of assist. Patient will benefit from continued skilled PT interventions to address impairments and progress towards PLOF. Acute PT will follow to progress mobility and stair training in preparation for safe discharge home with family support and [facility] services.     If plan is discharge home, recommend the following: A little help with walking and/or transfers;A little help with bathing/dressing/bathroom;Assistance with cooking/housework;Help with stairs or ramp for entrance;Assist for transportation   Can travel by private vehicle        Equipment Recommendations Rolling walker (2 wheels) (in room at eval and adjusted)  Recommendations for Other Services       Functional Status Assessment Patient has had a recent decline in their functional status and demonstrates the ability to make significant improvements in function in a reasonable and predictable amount of time.     Precautions / Restrictions Precautions Precautions: Knee;Fall Restrictions Weight Bearing Restrictions Per Provider Order: No LLE Weight Bearing Per Provider Order: Weight bearing  as tolerated      Mobility  Bed Mobility Overal bed mobility: Needs Assistance Bed Mobility: Supine to Sit     Supine to sit: HOB elevated, Used rails, Contact guard     General bed mobility comments: cues and increased time encouragement for increased IND    Transfers Overall transfer level: Needs assistance Equipment used: Rolling walker (2 wheels) Transfers: Sit to/from Stand Sit to Stand: Min assist, From elevated surface           General transfer comment: cues and pt able to push to stand with R UE    Ambulation/Gait Ambulation/Gait assistance: Contact guard assist Gait Distance (Feet): 20 Feet Assistive device: Rolling walker (2 wheels) Gait Pattern/deviations: Step-to pattern, Decreased stance time - left, Antalgic, Trunk flexed, Wide base of support Gait velocity: decreased     General Gait Details: pt self limiting L LE WB with B UE support from RW and indicating B UE fatigue, min cues for posture and steping with L LE first  Stairs            Wheelchair Mobility     Tilt Bed    Modified Rankin (Stroke Patients Only)       Balance Overall balance assessment: Needs assistance, History of Falls (signifcant fall hx pt reports sometimes her feet get tripped up and at other times she gets dizzy pt is under care of a neurologist) Sitting-balance support: Feet supported Sitting balance-Leahy Scale: Good     Standing balance support: Bilateral upper extremity supported, During functional activity, Reliant on assistive device for balance Standing balance-Leahy Scale: Poor  Pertinent Vitals/Pain Pain Assessment Pain Assessment: 0-10 Pain Score: 6  Pain Location: L LE and knee Pain Descriptors / Indicators: Aching, Constant, Discomfort, Dull, Grimacing, Operative site guarding Pain Intervention(s): Limited activity within patient's tolerance, Monitored during session, Premedicated before session,  Repositioned, Ice applied    Home Living Family/patient expects to be discharged to:: Private residence Living Arrangements: Spouse/significant other;Children Available Help at Discharge: Family Type of Home: House Home Access: Stairs to enter Entrance Stairs-Rails: Right;Left;Can reach both Secretary/administrator of Steps: 10   Home Layout: One level Home Equipment: Cane - single point;Shower seat      Prior Function Prior Level of Function : Independent/Modified Independent;History of Falls (last six months) (pt reports fall OOB 3 days ago and a minimum of 5 falls in the past month)             Mobility Comments: mod I, no AD for all ADLs, self care tasks and IADLs       Extremity/Trunk Assessment        Lower Extremity Assessment Lower Extremity Assessment: LLE deficits/detail LLE Deficits / Details: ankle DF/PF 5/5; SLR < 10 degree lag-minimal lift from bed LLE Sensation: decreased light touch (distal to L knee joint line, full normal sensation foot, ankle and proximal L LE)    Cervical / Trunk Assessment Cervical / Trunk Assessment: Normal  Communication   Communication Communication: No apparent difficulties  Cognition Arousal: Alert Behavior During Therapy: WFL for tasks assessed/performed Overall Cognitive Status: Within Functional Limits for tasks assessed                                          General Comments      Exercises     Assessment/Plan    PT Assessment Patient needs continued PT services  PT Problem List Decreased strength;Decreased range of motion;Decreased activity tolerance;Decreased balance;Decreased mobility;Decreased coordination;Pain       PT Treatment Interventions DME instruction;Gait training;Stair training;Functional mobility training;Therapeutic activities;Therapeutic exercise;Balance training;Neuromuscular re-education;Patient/family education;Modalities    PT Goals (Current goals can be found in the  Care Plan section)  Acute Rehab PT Goals Patient Stated Goal: to be able to ride my horse again PT Goal Formulation: With patient Time For Goal Achievement: 05/01/23 Potential to Achieve Goals: Fair    Frequency 7X/week     Co-evaluation               AM-PAC PT "6 Clicks" Mobility  Outcome Measure Help needed turning from your back to your side while in a flat bed without using bedrails?: A Little Help needed moving from lying on your back to sitting on the side of a flat bed without using bedrails?: A Little Help needed moving to and from a bed to a chair (including a wheelchair)?: A Little Help needed standing up from a chair using your arms (e.g., wheelchair or bedside chair)?: A Little Help needed to walk in hospital room?: A Little Help needed climbing 3-5 steps with a railing? : A Lot 6 Click Score: 17    End of Session Equipment Utilized During Treatment: Gait belt Activity Tolerance: Patient tolerated treatment well;No increased pain Patient left: in chair;with call bell/phone within reach;with family/visitor present Nurse Communication: Mobility status PT Visit Diagnosis: Unsteadiness on feet (R26.81);Repeated falls (R29.6);Muscle weakness (generalized) (M62.81);Other abnormalities of gait and mobility (R26.89);History of falling (Z91.81);Difficulty in walking, not elsewhere classified (R26.2);Pain Pain - Right/Left:  Left Pain - part of body: Knee;Leg    Time: 1745-1815 PT Time Calculation (min) (ACUTE ONLY): 30 min   Charges:   PT Evaluation $PT Eval Low Complexity: 1 Low PT Treatments $Gait Training: 8-22 mins PT General Charges $$ ACUTE PT VISIT: 1 Visit         Johnny Bridge, PT Acute Rehab   Jacqualyn Posey 04/17/2023, 6:41 PM

## 2023-04-17 NOTE — Op Note (Signed)
Operative Note  Date of operation: 04/17/2023 Preoperative diagnosis: Left knee primary osteoarthritis Postoperative diagnosis: Same  Procedure: Left cemented total knee arthroplasty  Implants: Biomet/Zimmer cemented persona knee system Implant Name Type Inv. Item Serial No. Manufacturer Lot No. LRB No. Used Action  CEMENT BONE R 1X40 - [id number] Cement CEMENT BONE R 1X40  ZIMMER RECON(ORTH,TRAU,BIO,SG) [id number] Left 2 Implanted  STEM POLY PAT PLY 33M KNEE - [id number] Knees STEM POLY PAT PLY 33M KNEE  ZIMMER RECON(ORTH,TRAU,BIO,SG) [id number] Left 1 Implanted  INSERT TIB ASF 14 6-7/CD LT - [id number] Insert INSERT TIB ASF 14 6-7/CD LT  ZIMMER RECON(ORTH,TRAU,BIO,SG) [id number] Left 1 Implanted  COMP TIB PS KNEE D 0D LT - [id number] Joint COMP TIB PS KNEE D 0D LT  ZIMMER RECON(ORTH,TRAU,BIO,SG) [id number] Left 1 Implanted  FEMUR CMT CR STD SZ 6 LT KNEE - [id number] Joint FEMUR CMT CR STD SZ 6 LT KNEE  ZIMMER RECON(ORTH,TRAU,BIO,SG) [id number] Left 1 Implanted   Surgeon: Vanita Panda. Magnus Ivan, MD Assistant: Rexene Edison, PA-C  Anesthesia: #1 left lower extremity adductor canal block, #2 spinal, #3 local Tourniquet time: Under 1 hour EBL: Under 50 cc Antibiotics: IV gentamicin Complications: None  Indications: The patient is a 57 year old female with debilitating arthritis involving her left knee.  She is someone who is morbidly obese with a BMI of 37.  She also is on chronic narcotic pain medication.  She does have severe debilitating end-stage arthritis of her left knee and is failed conservative treatment for many years now.  Her x-rays show bone-on-bone wear of that knee with complete loss of the joint space.  At this point her knee pain is so severe and it is daily.  It is detrimentally affecting her mobility, her quality of life and her actives daily living.  We have recommended a total knee arthroplasty at this point.  We did discuss in length in detail the risks of acute blood loss  anemia, nerve or vessel injury, fracture, infection, DVT, implant failure, continued pain giving long-term narcotic use and wound healing issues.  She understands that our goals are hopefully decrease pain, improved mobility, and improve quality of life.  Procedure description: After informed consent was obtained and the appropriate left knee was marked, anesthesia obtained a left lower extremity adductor canal block in the holding room.  The patient was then brought to the operating room and set up on the operating table where spinal anesthesia was obtained.  She was then laid in supine position on the operating table and a Foley catheter was placed.  A nonsterile tractors placed around her upper left thigh and her left thigh, knee, leg and ankle were prepped and draped in DuraPrep and sterile drapes including a sterile stockinette.  A timeout was called and she was then divided the correct patient the correct left knee.  An Esmarch was used to wrap out the leg and the tourniquet was inflated to 300 mm pressure.  With the knee extended a direct midline incision was made over the patella and carried proximally distally.  Dissection was carried down to the knee joint and a medial parapatellar arthrotomy was made.  There was a moderate joint effusion encountered.  There was significant cartilage loss especially the medial compartment the knee and the patellofemoral joint.  There were large osteophytes in all 3 compartments.  With the knee in a flexed position we removed all the osteophytes that we could see around the knee and the patella.  We removed remnants of the ACL as well  as medial lateral meniscus.  We then used extramedullary cutting guide for making her proximal tibia cut correction for varus and valgus and a 7 degree slope.  We made this cut to take 2 mm off the low side and we backed this down to more millimeters.  We then used a intramedullary based cutting guide for distal femur cut setting this for a  left knee at 5 degrees externally rotated and a 10 mm distal femoral cut.  We brought the knee back down to full extension and actually she got slightly hyperextended with a 10 mm block.  We then backed the femur for femoral sizing.  We placed a femoral sizing guide based off the epicondylar axis and chose a size 6 femur.  We put a 4-in-1 cutting block for a size 6 femur and made our anterior and posterior cuts followed our chamfer cuts.  We then went back to the tibia and chose a size the tibial tray for coverage over the tibial plateau setting the rotation of the tibial tubercle and the femur.  We did our drill drill hole and keel punch off of this.  We did not find great quality bone so we felt that cemented implants were prudent and appropriate.  We trialed our size D left tibia combined with her size 6 right CR standard femur.  We went up to a 14 mm thickness medial congruent polythene insert and we are pleased with the range of motion and stability without answer.  We then made a patella cut and drilled 3 holes for a size 29 patella button.  With all trial instrumentation they we put her through several cycles of range of motion we are pleased with range of motion and stability.  We then removed all trial instrumentation with the knee.  The knee was irrigated with normal saline solution.  We then placed Marcaine with epinephrine around the arthrotomy.  Next we mixed our cement and with the knee in a flexed position we cemented our Biomet/Zimmer persona tibial tray for a left knee size D followed by cementing our size 6 standard CR left femur.  We placed our 14 mm left medial congruent polythene insert and cemented our size 29 patella button.  We then held the knee compressed and fully extended while the cement hardened.  Once it hardened we let the tourniquet down and hemostasis was obtained with electrocautery.  The joint capsule was closed with interrupted #1 Vicryl suture followed by 0 Vicryl close deep  tissue and 2-0 Vicryl to close subcutaneous tissue.  The skin was closed with staples.  Well-padded sterile dressing was applied.  The patient was taken recovery in stable condition.  Rexene Edison, PA-C did assist during the entire case and beginning to end and his assistance was crucial and medically necessary for soft tissue management and retraction, helping guide implant placement and a layered closure of the wound.

## 2023-04-17 NOTE — Transfer of Care (Signed)
Immediate Anesthesia Transfer of Care Note  Patient: Molly Berry  Procedure(s) Performed: LEFT TOTAL KNEE ARTHROPLASTY (Left: Knee)  Patient Location: PACU  Anesthesia Type:MAC and Spinal  Level of Consciousness: awake, alert , oriented, and patient cooperative  Airway & Oxygen Therapy: Patient Spontanous Breathing and Patient connected to face mask oxygen  Post-op Assessment: Report given to RN and Post -op Vital signs reviewed and stable  Post vital signs: Reviewed and stable  Last Vitals:  Vitals Value Taken Time  BP 98/66 04/17/23 1300  Temp    Pulse 61 04/17/23 1304  Resp 9 04/17/23 1304  SpO2 93 % 04/17/23 1304  Vitals shown include unfiled device data.  Last Pain:  Vitals:   04/17/23 0922  TempSrc:   PainSc: 0-No pain         Complications: No notable events documented.

## 2023-04-17 NOTE — Anesthesia Procedure Notes (Signed)
Procedure Name: MAC Date/Time: 04/17/2023 11:10 AM  Performed by: Dennison Nancy, CRNAPre-anesthesia Checklist: Patient identified, Emergency Drugs available, Suction available, Patient being monitored and Timeout performed Oxygen Delivery Method: Simple face mask Dental Injury: Teeth and Oropharynx as per pre-operative assessment

## 2023-04-17 NOTE — Anesthesia Postprocedure Evaluation (Signed)
Anesthesia Post Note  Patient: Molly Berry  Procedure(s) Performed: LEFT TOTAL KNEE ARTHROPLASTY (Left: Knee)     Patient location during evaluation: PACU Anesthesia Type: Spinal Level of consciousness: sedated and patient cooperative Pain management: pain level controlled Vital Signs Assessment: post-procedure vital signs reviewed and stable Respiratory status: spontaneous breathing Cardiovascular status: stable Anesthetic complications: no   No notable events documented.  Last Vitals:  Vitals:   04/17/23 1426 04/17/23 1519  BP: 96/64 111/73  Pulse: 61 62  Resp: 20 20  Temp:  36.7 C  SpO2: 93% 97%    Last Pain:  Vitals:   04/17/23 1519  TempSrc: Oral  PainSc: 9                  Sherrin Stahle

## 2023-04-17 NOTE — Progress Notes (Signed)
The patient requested that her home medication, Auvelity, be added to her active medication list during hospitalization. The RN contacted the pharmacy, but the medication is currently not in stock. The RN informed the patient's spouse, requesting that he bring the medication starting tomorrow morning

## 2023-04-17 NOTE — Anesthesia Procedure Notes (Signed)
Spinal  Patient location during procedure: OR Start time: 04/17/2023 11:04 AM End time: 04/17/2023 11:08 AM Reason for block: surgical anesthesia Staffing Performed: anesthesiologist  Anesthesiologist: Lewie Loron, MD Performed by: Lewie Loron, MD Authorized by: Lewie Loron, MD   Preanesthetic Checklist Completed: patient identified, IV checked, site marked, risks and benefits discussed, surgical consent, monitors and equipment checked, pre-op evaluation and timeout performed Spinal Block Patient position: sitting Prep: DuraPrep and site prepped and draped Patient monitoring: heart rate, continuous pulse ox and blood pressure Approach: midline Location: L3-4 Injection technique: single-shot Needle Needle type: Spinocan  Needle gauge: 25 G Needle length: 9 cm Additional Notes Expiration date of kit checked and confirmed. Patient tolerated procedure well, without complications.

## 2023-04-18 DIAGNOSIS — M1712 Unilateral primary osteoarthritis, left knee: Secondary | ICD-10-CM | POA: Diagnosis not present

## 2023-04-18 LAB — BASIC METABOLIC PANEL
Anion gap: 8 (ref 5–15)
BUN: 21 mg/dL — ABNORMAL HIGH (ref 6–20)
CO2: 25 mmol/L (ref 22–32)
Calcium: 8.3 mg/dL — ABNORMAL LOW (ref 8.9–10.3)
Chloride: 102 mmol/L (ref 98–111)
Creatinine, Ser: 1.01 mg/dL — ABNORMAL HIGH (ref 0.44–1.00)
GFR, Estimated: >60 mL/min (ref >60)
Glucose, Bld: 181 mg/dL — ABNORMAL HIGH (ref 70–99)
Potassium: 3.9 mmol/L (ref 3.5–5.1)
Sodium: 135 mmol/L (ref 135–145)

## 2023-04-18 LAB — CBC
HCT: 35.7 % — ABNORMAL LOW (ref 36.0–46.0)
Hemoglobin: 10.9 g/dL — ABNORMAL LOW (ref 12.0–15.0)
MCH: 30.1 pg (ref 26.0–34.0)
MCHC: 30.5 g/dL (ref 30.0–36.0)
MCV: 98.6 fL (ref 80.0–100.0)
Platelets: 263 10*3/uL (ref 150–400)
RBC: 3.62 MIL/uL — ABNORMAL LOW (ref 3.87–5.11)
RDW: 12.8 % (ref 11.5–15.5)
WBC: 7.5 10*3/uL (ref 4.0–10.5)
nRBC: 0 % (ref 0.0–0.2)

## 2023-04-18 MED ORDER — TIZANIDINE HCL 4 MG PO TABS
4.0000 mg | ORAL_TABLET | Freq: Four times a day (QID) | ORAL | Status: DC | PRN
  Administered 2023-04-18 – 2023-04-20 (×6): 4 mg via ORAL
  Filled 2023-04-18 (×7): qty 1

## 2023-04-18 MED ORDER — ASPIRIN 81 MG PO CHEW: 81.0000 mg | CHEWABLE_TABLET | Freq: Two times a day (BID) | ORAL | 0 refills | Status: DC

## 2023-04-18 MED ORDER — HYDROMORPHONE HCL 4 MG PO TABS: 4.0000 mg | ORAL_TABLET | ORAL | 0 refills | Status: DC | PRN

## 2023-04-18 NOTE — Progress Notes (Signed)
Physical Therapy Treatment Patient Details Name: Molly Berry MRN: [id number] DOB: Aug 25, 1965 Today's Date: 04/18/2023   History of Present Illness 57 yo female presents to therapy s/p L TKA on 04/17/2023 due to failure of conservative measures. Pt PMH includes but is not limited to: hypoxia, opioid dependence, B UE pain, L RTC tear s/p arthroscopic repair, GERD, DM II, fibromyalgia and chronic pain syndrome, HLD, goiter, h/o gastric bypass, anemia, COPD, endometrial ca, depression, anxiety, HTN, pulmonary and thyroid nodules.    PT Comments  Pt continues to participate well. She has some concerns about pain control. She is hopeful she will feel ready to d/c home on tomorrow. Will plan to practice stair negotiation on tomorrow.     If plan is discharge home, recommend the following: A little help with walking and/or transfers;A little help with bathing/dressing/bathroom;Assistance with cooking/housework;Help with stairs or ramp for entrance;Assist for transportation   Can travel by private vehicle        Equipment Recommendations       Recommendations for Other Services       Precautions / Restrictions Precautions Precautions: Fall;Knee Precaution Comments: chronic pain Restrictions Weight Bearing Restrictions Per Provider Order: No LLE Weight Bearing Per Provider Order: Weight bearing as tolerated     Mobility  Bed Mobility Overal bed mobility: Needs Assistance Bed Mobility: Supine to Sit, Sit to Supine     Supine to sit: Supervision, HOB elevated, Used rails Sit to supine: Supervision, HOB elevated, Used rails   General bed mobility comments: Increased time.    Transfers Overall transfer level: Needs assistance Equipment used: Rolling walker (2 wheels) Transfers: Sit to/from Stand Sit to Stand: Contact guard assist           General transfer comment: Cues for safety, hand/LE placement. Increased time.    Ambulation/Gait Ambulation/Gait assistance: Contact  guard assist Gait Distance (Feet): 75 Feet Assistive device: Rolling walker (2 wheels) Gait Pattern/deviations: Step-through pattern, Decreased stride length       General Gait Details: slow but steady gait. pt denied dizziness. tolerated distance well.   Stairs             Wheelchair Mobility     Tilt Bed    Modified Rankin (Stroke Patients Only)       Balance Overall balance assessment: Needs assistance, History of Falls         Standing balance support: Bilateral upper extremity supported, During functional activity, Reliant on assistive device for balance Standing balance-Leahy Scale: Fair                              Cognition Arousal: Alert Behavior During Therapy: WFL for tasks assessed/performed Overall Cognitive Status: Within Functional Limits for tasks assessed                                          Exercises     General Comments        Pertinent Vitals/Pain Pain Assessment Pain Assessment: Faces Faces Pain Scale: Hurts even more Pain Location: L posterior knee and calf Pain Descriptors / Indicators: Aching, Dull Pain Intervention(s): Limited activity within patient's tolerance, Monitored during session, Repositioned, Premedicated before session, Ice applied    Home Living  Prior Function            PT Goals (current goals can now be found in the care plan section) Progress towards PT goals: Progressing toward goals    Frequency    7X/week      PT Plan      Co-evaluation              AM-PAC PT "6 Clicks" Mobility   Outcome Measure  Help needed turning from your back to your side while in a flat bed without using bedrails?: A Little Help needed moving from lying on your back to sitting on the side of a flat bed without using bedrails?: A Little Help needed moving to and from a bed to a chair (including a wheelchair)?: A Little Help needed standing up  from a chair using your arms (e.g., wheelchair or bedside chair)?: A Little Help needed to walk in hospital room?: A Little Help needed climbing 3-5 steps with a railing? : A Little 6 Click Score: 18    End of Session Equipment Utilized During Treatment: Gait belt Activity Tolerance: Patient tolerated treatment well Patient left: in bed;with call bell/phone within reach;with bed alarm set   PT Visit Diagnosis: Unsteadiness on feet (R26.81);Repeated falls (R29.6);Muscle weakness (generalized) (M62.81);Other abnormalities of gait and mobility (R26.89);History of falling (Z91.81);Difficulty in walking, not elsewhere classified (R26.2);Pain Pain - Right/Left: Left Pain - part of body: Knee     Time: 1610-9604 PT Time Calculation (min) (ACUTE ONLY): 41 min  Charges:    $Gait Training: 23-37 mins PT General Charges $$ ACUTE PT VISIT: 1 Visit                         Faye Ramsay, PT Acute Rehabilitation  Office: [phone]

## 2023-04-18 NOTE — Discharge Instructions (Signed)

## 2023-04-18 NOTE — Progress Notes (Signed)
Physical Therapy Treatment Patient Details Name: Molly Berry MRN: [id number] DOB: 01-Nov-1965 Today's Date: 04/18/2023   History of Present Illness 57 yo female presents to therapy s/p L TKA on 04/17/2023 due to failure of conservative measures. Pt PMH includes but is not limited to: hypoxia, opioid dependence, B UE pain, L RTC tear s/p arthroscopic repair, GERD, DM II, fibromyalgia and chronic pain syndrome, HLD, goiter, h/o gastric bypass, anemia, COPD, endometrial ca, depression, anxiety, HTN, pulmonary and thyroid nodules.    PT Comments  Pt agreeable to working with therapy. Moderate pain with activity. Pt is progressing well. She is hopeful to dc home on tomorrow instead of on Monday. Will continue to progress activity as tolerated.     If plan is discharge home, recommend the following: A little help with walking and/or transfers;A little help with bathing/dressing/bathroom;Assistance with cooking/housework;Help with stairs or ramp for entrance;Assist for transportation   Can travel by private vehicle        Equipment Recommendations       Recommendations for Other Services       Precautions / Restrictions Precautions Precautions: Fall;Knee Precaution Comments: chronic pain Restrictions Weight Bearing Restrictions Per Provider Order: No LLE Weight Bearing Per Provider Order: Weight bearing as tolerated     Mobility  Bed Mobility Overal bed mobility: Needs Assistance Bed Mobility: Supine to Sit, Sit to Supine     Supine to sit: Contact guard, HOB elevated, Used rails Sit to supine: Contact guard assist, HOB elevated, Used rails   General bed mobility comments: Increased time.    Transfers Overall transfer level: Needs assistance Equipment used: Rolling walker (2 wheels) Transfers: Sit to/from Stand Sit to Stand: Contact guard assist           General transfer comment: Cues for safety, hand/LE placement. Increased time.     Ambulation/Gait Ambulation/Gait assistance: Contact guard assist Gait Distance (Feet): 75 Feet Assistive device: Rolling walker (2 wheels) Gait Pattern/deviations: Step-through pattern, Decreased stride length       General Gait Details: slow but steady gait. pt denied dizziness. tolerated distance well.   Stairs             Wheelchair Mobility     Tilt Bed    Modified Rankin (Stroke Patients Only)       Balance Overall balance assessment: Needs assistance, History of Falls         Standing balance support: Bilateral upper extremity supported, During functional activity, Reliant on assistive device for balance Standing balance-Leahy Scale: Poor                              Cognition Arousal: Alert Behavior During Therapy: WFL for tasks assessed/performed Overall Cognitive Status: Within Functional Limits for tasks assessed                                          Exercises Total Joint Exercises Ankle Circles/Pumps: AROM, Both, 10 reps Quad Sets: AROM, Left, 10 reps Heel Slides: AAROM, Left, 10 reps Hip ABduction/ADduction: AAROM, Left, 10 reps, AROM Straight Leg Raises: AAROM, Left, 10 reps, AROM Goniometric ROM: ~10-60 degrees    General Comments        Pertinent Vitals/Pain Pain Assessment Pain Assessment: Faces Faces Pain Scale: Hurts even more Pain Location: L posterior knee and calf Pain Descriptors / Indicators: Aching,  Dull Pain Intervention(s): Limited activity within patient's tolerance, Monitored during session, Ice applied, Repositioned    Home Living                          Prior Function            PT Goals (current goals can now be found in the care plan section) Progress towards PT goals: Progressing toward goals    Frequency    7X/week      PT Plan      Co-evaluation              AM-PAC PT "6 Clicks" Mobility   Outcome Measure  Help needed turning from your  back to your side while in a flat bed without using bedrails?: A Little Help needed moving from lying on your back to sitting on the side of a flat bed without using bedrails?: A Little Help needed moving to and from a bed to a chair (including a wheelchair)?: A Little Help needed standing up from a chair using your arms (e.g., wheelchair or bedside chair)?: A Little Help needed to walk in hospital room?: A Little Help needed climbing 3-5 steps with a railing? : A Lot 6 Click Score: 17    End of Session Equipment Utilized During Treatment: Gait belt Activity Tolerance: Patient tolerated treatment well Patient left: in bed;with call bell/phone within reach;with bed alarm set   PT Visit Diagnosis: Unsteadiness on feet (R26.81);Repeated falls (R29.6);Muscle weakness (generalized) (M62.81);Other abnormalities of gait and mobility (R26.89);History of falling (Z91.81);Difficulty in walking, not elsewhere classified (R26.2);Pain Pain - Right/Left: Left Pain - part of body: Knee     Time: 2725-3664 PT Time Calculation (min) (ACUTE ONLY): 36 min  Charges:    $Gait Training: 8-22 mins $Therapeutic Exercise: 8-22 mins PT General Charges $$ ACUTE PT VISIT: 1 Visit                         Faye Ramsay, PT Acute Rehabilitation  Office: [phone]

## 2023-04-18 NOTE — Plan of Care (Signed)
  Problem: Education: Goal: Knowledge of General Education information will improve Description: Including pain rating scale, medication(s)/side effects and non-pharmacologic comfort measures Outcome: Progressing   Problem: Health Behavior/Discharge Planning: Goal: Ability to manage health-related needs will improve Outcome: Not Progressing   Problem: Clinical Measurements: Goal: Ability to maintain clinical measurements within normal limits will improve Outcome: Progressing Goal: Will remain free from infection Outcome: Progressing Goal: Diagnostic test results will improve Outcome: Progressing Goal: Respiratory complications will improve Outcome: Progressing Goal: Cardiovascular complication will be avoided Outcome: Progressing   Problem: Activity: Goal: Risk for activity intolerance will decrease Outcome: Progressing   Problem: Nutrition: Goal: Adequate nutrition will be maintained Outcome: Progressing   Problem: Coping: Goal: Level of anxiety will decrease Outcome: Progressing   Problem: Elimination: Goal: Will not experience complications related to bowel motility Outcome: Progressing Goal: Will not experience complications related to urinary retention Outcome: Progressing   Problem: Pain Management: Goal: General experience of comfort will improve Outcome: Progressing   Problem: Safety: Goal: Ability to remain free from injury will improve Outcome: Progressing   Problem: Skin Integrity: Goal: Risk for impaired skin integrity will decrease Outcome: Progressing   Problem: Education: Goal: Knowledge of General Education information will improve Description: Including pain rating scale, medication(s)/side effects and non-pharmacologic comfort measures Outcome: Progressing   Problem: Health Behavior/Discharge Planning: Goal: Ability to manage health-related needs will improve Outcome: Progressing   Problem: Clinical Measurements: Goal: Ability to maintain  clinical measurements within normal limits will improve Outcome: Progressing Goal: Will remain free from infection Outcome: Progressing Goal: Diagnostic test results will improve Outcome: Progressing Goal: Respiratory complications will improve Outcome: Progressing Goal: Cardiovascular complication will be avoided Outcome: Progressing   Problem: Activity: Goal: Risk for activity intolerance will decrease Outcome: Progressing   Problem: Elimination: Goal: Will not experience complications related to bowel motility Outcome: Progressing Goal: Will not experience complications related to urinary retention Outcome: Progressing   Problem: Pain Management: Goal: General experience of comfort will improve Outcome: Progressing   Problem: Education: Goal: Knowledge of the prescribed therapeutic regimen will improve Outcome: Progressing Goal: Individualized Educational Video(s) Outcome: Progressing   Problem: Activity: Goal: Ability to avoid complications of mobility impairment will improve Outcome: Progressing Goal: Range of joint motion will improve Outcome: Progressing

## 2023-04-18 NOTE — Progress Notes (Signed)
Subjective: 1 Day Post-Op Procedure(s) (LRB): LEFT TOTAL KNEE ARTHROPLASTY (Left) Patient reports pain as moderate.    Objective: Vital signs in last 24 hours: Temp:  [97.7 F (36.5 C)-99.3 F (37.4 C)] 97.9 F (36.6 C) (12/14 1005) Pulse Rate:  [58-84] 74 (12/14 1005) Resp:  [11-20] 16 (12/14 1005) BP: (91-114)/(57-78) 101/69 (12/14 1005) SpO2:  [92 %-100 %] 100 % (12/14 1005)  Intake/Output from previous day: 12/13 0701 - 12/14 0700 In: 1477.6 [P.O.:420; I.V.:848.1; IV Piggyback:209.5] Out: 1300 [Urine:1200; Blood:100] Intake/Output this shift: Total I/O In: 892.5 [P.O.:260; I.V.:632.5] Out: -   Recent Labs    04/18/23 0337  HGB 10.9*   Recent Labs    04/18/23 0337  WBC 7.5  RBC 3.62*  HCT 35.7*  PLT 263   Recent Labs    04/18/23 0337  NA 135  K 3.9  CL 102  CO2 25  BUN 21*  CREATININE 1.01*  GLUCOSE 181*  CALCIUM 8.3*   No results for input(s): "LABPT", "INR" in the last 72 hours.  Sensation intact distally Intact pulses distally Dorsiflexion/Plantar flexion intact Incision: dressing C/D/I Compartment soft   Assessment/Plan: 1 Day Post-Op Procedure(s) (LRB): LEFT TOTAL KNEE ARTHROPLASTY (Left) Up with therapy Plan for discharge tomorrow Discharge home with home health      [name] 04/18/2023, 12:02 PM

## 2023-04-18 NOTE — Plan of Care (Signed)
  Problem: Education: Goal: Knowledge of General Education information will improve Description Including pain rating scale, medication(s)/side effects and non-pharmacologic comfort measures Outcome: Progressing   

## 2023-04-19 DIAGNOSIS — M1712 Unilateral primary osteoarthritis, left knee: Secondary | ICD-10-CM | POA: Diagnosis not present

## 2023-04-19 NOTE — Progress Notes (Signed)
Physical Therapy Treatment Patient Details Name: Molly Berry MRN: [id number] DOB: 1965/08/02 Today's Date: 04/19/2023   History of Present Illness 57 yo female presents to therapy s/p L TKA on 04/17/2023 due to failure of conservative measures. Pt PMH includes but is not limited to: hypoxia, opioid dependence, B UE pain, L RTC tear s/p arthroscopic repair, GERD, DM II, fibromyalgia and chronic pain syndrome, HLD, goiter, h/o gastric bypass, anemia, COPD, endometrial ca, depression, anxiety, HTN, pulmonary and thyroid nodules.    PT Comments  Pt agreeable to working with therapy. Deferred exercises this session 2* pain. Pt ambulated ~100 feet in the hallway. Moderate pain with activity. Will continue to follow. Plan is for possible d/c home on tomorrow.     If plan is discharge home, recommend the following: A little help with walking and/or transfers;A little help with bathing/dressing/bathroom;Assistance with cooking/housework;Help with stairs or ramp for entrance;Assist for transportation   Can travel by private vehicle        Equipment Recommendations       Recommendations for Other Services       Precautions / Restrictions Precautions Precautions: Fall;Knee Precaution Comments: chronic pain Restrictions Weight Bearing Restrictions Per Provider Order: No LLE Weight Bearing Per Provider Order: Weight bearing as tolerated     Mobility  Bed Mobility Overal bed mobility: Needs Assistance Bed Mobility: Supine to Sit, Sit to Supine     Supine to sit: Supervision, HOB elevated, Used rails Sit to supine: Supervision, HOB elevated, Used rails   General bed mobility comments: Increased time. Pt used gait belt.    Transfers Overall transfer level: Needs assistance Equipment used: Rolling walker (2 wheels) Transfers: Sit to/from Stand Sit to Stand: Supervision           General transfer comment: Cues for safety, hand/LE placement. Increased time.     Ambulation/Gait Ambulation/Gait assistance: Supervision Gait Distance (Feet): 100 Feet Assistive device: Rolling walker (2 wheels) Gait Pattern/deviations: Step-through pattern, Decreased stride length       General Gait Details: slow but steady gait. pt denied dizziness. tolerated distance well.   Stairs             Wheelchair Mobility     Tilt Bed    Modified Rankin (Stroke Patients Only)       Balance Overall balance assessment: Needs assistance, History of Falls         Standing balance support: Bilateral upper extremity supported, During functional activity, Reliant on assistive device for balance Standing balance-Leahy Scale: Fair                              Cognition Arousal: Alert Behavior During Therapy: WFL for tasks assessed/performed Overall Cognitive Status: Within Functional Limits for tasks assessed                                          Exercises      General Comments        Pertinent Vitals/Pain Pain Assessment Pain Assessment: 0-10 Pain Score: 8  Pain Location: L posterior knee and calf Pain Descriptors / Indicators: Aching, Dull Pain Intervention(s): Limited activity within patient's tolerance, Monitored during session, Ice applied, Repositioned    Home Living  Prior Function            PT Goals (current goals can now be found in the care plan section) Progress towards PT goals: Progressing toward goals    Frequency    7X/week      PT Plan      Co-evaluation              AM-PAC PT "6 Clicks" Mobility   Outcome Measure  Help needed turning from your back to your side while in a flat bed without using bedrails?: A Little Help needed moving from lying on your back to sitting on the side of a flat bed without using bedrails?: A Little Help needed moving to and from a bed to a chair (including a wheelchair)?: A Little Help needed standing  up from a chair using your arms (e.g., wheelchair or bedside chair)?: A Little Help needed to walk in hospital room?: A Little Help needed climbing 3-5 steps with a railing? : A Little 6 Click Score: 18    End of Session Equipment Utilized During Treatment: Gait belt Activity Tolerance: Patient tolerated treatment well;Patient limited by pain Patient left: in bed;with call bell/phone within reach;with bed alarm set   PT Visit Diagnosis: Unsteadiness on feet (R26.81);Repeated falls (R29.6);Muscle weakness (generalized) (M62.81);Other abnormalities of gait and mobility (R26.89);History of falling (Z91.81);Difficulty in walking, not elsewhere classified (R26.2);Pain Pain - Right/Left: Left Pain - part of body: Knee     Time: 1442-1500 PT Time Calculation (min) (ACUTE ONLY): 18 min  Charges:    $Gait Training: 8-22 mins PT General Charges $$ ACUTE PT VISIT: 1 Visit                         Faye Ramsay, PT Acute Rehabilitation  Office: [phone]

## 2023-04-19 NOTE — Progress Notes (Signed)
Subjective: 2 Days Post-Op Procedure(s) (LRB): LEFT TOTAL KNEE ARTHROPLASTY (Left) Patient reports pain as moderate.    Objective: Vital signs in last 24 hours: Temp:  [97.5 F (36.4 C)-97.9 F (36.6 C)] 97.5 F (36.4 C) (12/15 0546) Pulse Rate:  [63-74] 63 (12/15 0546) Resp:  [14-16] 15 (12/15 0546) BP: (95-110)/(61-76) 96/61 (12/15 0546) SpO2:  [90 %-100 %] 100 % (12/15 0546)  Intake/Output from previous day: 12/14 0701 - 12/15 0700 In: 1832.7 [P.O.:1000; I.V.:832.7] Out: 300 [Urine:300] Intake/Output this shift: No intake/output data recorded.  Recent Labs    04/18/23 0337  HGB 10.9*   Recent Labs    04/18/23 0337  WBC 7.5  RBC 3.62*  HCT 35.7*  PLT 263   Recent Labs    04/18/23 0337  NA 135  K 3.9  CL 102  CO2 25  BUN 21*  CREATININE 1.01*  GLUCOSE 181*  CALCIUM 8.3*   No results for input(s): "LABPT", "INR" in the last 72 hours.  Sensation intact distally Intact pulses distally Dorsiflexion/Plantar flexion intact Incision: dressing C/D/I Compartment soft   Assessment/Plan: 2 Days Post-Op Procedure(s) (LRB): LEFT TOTAL KNEE ARTHROPLASTY (Left) she is having increased pain today compared to yesterday and is quite dismayed by this.  I did describe that this is quite normal in the setting of total knee arthroplasty.  Exam is benign this morning.  Will plan to continue to mobilize with physical therapy although she will likely need an additional day for mobilization and pain control and mobilization.     Nobel Brar 04/19/2023, 8:10 AM

## 2023-04-19 NOTE — Progress Notes (Signed)
PT Cancellation Note  Patient Details Name: Molly Berry MRN: [id number] DOB: 06-25-1965   Cancelled Treatment:    Reason Eval/Treat Not Completed: Pain limiting ability to participate. Pt declined participation with PT this morning. Will check back this afternoon.    Faye Ramsay, PT Acute Rehabilitation  Office: [phone]

## 2023-04-20 ENCOUNTER — Other Ambulatory Visit: Payer: Self-pay

## 2023-04-20 ENCOUNTER — Encounter (HOSPITAL_COMMUNITY): Payer: Self-pay | Admitting: Orthopaedic Surgery

## 2023-04-20 DIAGNOSIS — M1712 Unilateral primary osteoarthritis, left knee: Secondary | ICD-10-CM | POA: Diagnosis not present

## 2023-04-20 NOTE — Progress Notes (Signed)
Patient ID: Molly Berry, female   DOB: August 15, 1965, 57 y.o.   MRN: [id number] The patient's left operative knee is stable.  Her dressing is clean and dry.  She has had limited mobility secondary to pain control.  Her vital signs are stable.  I do feel that is reasonable to discharge her to home today.  Strong pain medications have been sent into her pharmacy.  She agrees with the treatment plan.

## 2023-04-20 NOTE — Discharge Summary (Signed)
Patient ID: MAIE MYNATT MRN: [id number] DOB/AGE: 1965-09-27 57 y.o.  Admit date: 04/17/2023 Discharge date: 04/20/2023  Admission Diagnoses:  Principal Problem:   Status post total left knee replacement Active Problems:   Primary osteoarthritis of left knee   Discharge Diagnoses:  Same  Past Medical History:  Diagnosis Date   Allergy    Anemia    Anxiety    Arthritis    knees, multiple joints   Asthma    Bell palsy    Blood transfusion without reported diagnosis    Breast mass    lt breast mass x's 3 years increased in size   Chronic kidney disease    Chronic pain syndrome    COPD (chronic obstructive pulmonary disease) (HCC)    Depression    Diabetes mellitus    Endometrial cancer (HCC)    Fibromyalgia    GERD (gastroesophageal reflux disease)    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 This could reflect an inflammatory process,  although follow-up is necessary to exclude atypical neoplasm. Full  chest CT should be considered to evaluate for other pulmonary  findings.      Multiple thyroid nodules    Nephropathy    Pneumonia    PONV (postoperative nausea and vomiting)    Pulmonary nodules    Thyroid disease    Ulcer    Uterine cancer (HCC) 12/26/2011   Stage 1, grade 1, S/P hysterectomy initially by robotic technique and then salpingo-oophorectomy at [facility] receiving no postoperative treatment.    Surgeries: Procedure(s): LEFT TOTAL KNEE ARTHROPLASTY on 04/17/2023   Consultants:   Discharged Condition: Improved  Hospital Course: Molly Berry is an 57 y.o. female who was admitted 04/17/2023 for operative treatment ofStatus post total left knee replacement.  Patient has severe unremitting pain that affects sleep, daily activities, and work/hobbies. After pre-op clearance the patient was taken to the operating room on 04/17/2023 and underwent  Procedure(s): LEFT TOTAL KNEE ARTHROPLASTY.    Patient was given perioperative antibiotics:  Anti-infectives (From admission, onward)    Start     Dose/Rate Route Frequency Ordered Stop   04/17/23 2200  nitrofurantoin (macrocrystal-monohydrate) (MACROBID) capsule 100 mg        100 mg Oral Every 12 hours 04/17/23 1503     04/17/23 1030  Vancomycin (VANCOCIN) 1,500 mg in sodium chloride 0.9 % 500 mL IVPB  Status:  Discontinued       Placed in "Followed by" Linked Group   1,500 mg 250 mL/hr over 120 Minutes Intravenous  Once 04/17/23 0954 04/17/23 1503   04/17/23 1000  Vancomycin (VANCOCIN) 1,500 mg in sodium chloride 0.9 % 500 mL IVPB  Status:  Discontinued        1,500 mg 250 mL/hr over 120 Minutes Intravenous  Once 04/17/23 0947 04/17/23 0954   04/17/23 0900  gentamicin (GARAMYCIN) 380 mg in dextrose 5 % 100 mL IVPB        5 mg/kg  76.9 kg (Adjusted) 109.5 mL/hr over 60 Minutes Intravenous On call to O.R. 04/17/23 1914 04/17/23 1142        Patient was given sequential compression devices, early ambulation, and  chemoprophylaxis to prevent DVT.  Patient benefited maximally from hospital stay and there were no complications.    Recent vital signs: Patient Vitals for the past 24 hrs:  BP Temp Temp src Pulse Resp SpO2  04/19/23 2131 109/62 99.2 F (37.3 C) Oral 73 18 96 %  04/19/23 1341 108/64 99.3 F (37.4 C) Oral 67 14 96 %     Recent laboratory studies:  Recent Labs    04/18/23 0337  WBC 7.5  HGB 10.9*  HCT 35.7*  PLT 263  NA 135  K 3.9  CL 102  CO2 25  BUN 21*  CREATININE 1.01*  GLUCOSE 181*  CALCIUM 8.3*     Discharge Medications:   Allergies as of 04/20/2023       Reactions   Bee Venom Shortness Of Breath, Swelling, Rash   Cephalexin Anaphylaxis, Itching, Swelling    Ciprofloxacin Hives, Swelling, Rash   Penicillins Hives, Itching, Swelling, Rash   Vancomycin Anaphylaxis, Hives, Itching   Has taken for bronchitis under medical supervision on site with benadryl, Red man syndrome, gave benadryl to help, per RN.  Slow down infusion with future dose.  Flushing   Carbamazepine Other (See Comments)   Loss of balance, trembling, inability to think, resulting in ER visit shortly after start of course Speech, mind slows down, cant walk   Cefotaxime Rash   Sulfacetamide Sodium Rash   Adhesive [tape] Rash   Clindamycin Rash   Doxycycline Diarrhea, Nausea And Vomiting   Latex Rash   Sulfa Antibiotics Rash        Medication List     STOP taking these medications    oxyCODONE-acetaminophen 7.5-325 MG tablet Commonly known as: Percocet       TAKE these medications    albuterol 108 (90 Base) MCG/ACT inhaler Commonly known as: VENTOLIN HFA Inhale 2 puffs into the lungs every 6 (six) hours as needed for shortness of breath or wheezing.   ALPRAZolam 1 MG tablet Commonly known as: XANAX Take 1 mg by mouth 2 (two) times daily as needed for anxiety or sleep.   aspirin 81 MG chewable tablet Chew 1 tablet (81 mg total) by mouth 2 (two) times daily.   AUVELITY PO Take 1 tablet by mouth daily.   cholecalciferol 25 MCG (1000 UNIT) tablet Commonly known as: VITAMIN D3 Take 1,000 Units by mouth daily.   dicyclomine 20 MG tablet Commonly known as: BENTYL Take 20 mg by mouth as needed for spasms.   diphenhydrAMINE 25 mg capsule Commonly known as: BENADRYL Take 50 mg by mouth at bedtime.   esomeprazole 40 MG capsule Commonly known as: NEXIUM Take 40 mg by mouth daily.   famotidine 40 MG tablet Commonly known as: PEPCID Take 40 mg by mouth at bedtime.   furosemide 20 MG tablet Commonly known as: LASIX Take 20 mg by mouth daily as needed for fluid or edema.   gabapentin 600 MG tablet Commonly known as: NEURONTIN Take 1,200 mg by mouth 2 (two)  times daily.   HYDROmorphone 4 MG tablet Commonly known as: Dilaudid Take 1 tablet (4 mg total) by mouth every 4 (four) hours as needed for severe pain (pain score 7-10).   indapamide 2.5 MG tablet Commonly known as: LOZOL take 1 tablet by mouth once daily.   lamoTRIgine 200 MG tablet Commonly known as: LAMICTAL Take 200 mg by mouth 2 (two) times daily.   methocarbamol 750 MG tablet Commonly known as: ROBAXIN Take 750 mg by mouth as needed for  muscle spasms.   metoprolol succinate 50 MG 24 hr tablet Commonly known as: TOPROL-XL Take 50 mg by mouth daily.   nitrofurantoin (macrocrystal-monohydrate) 100 MG capsule Commonly known as: MACROBID Take 1 capsule (100 mg total) by mouth every 12 (twelve) hours.   ondansetron 8 MG tablet Commonly known as: ZOFRAN Take 1 tablet (8 mg total) by mouth every 8 (eight) hours as needed for nausea or vomiting. What changed: when to take this   simvastatin 20 MG tablet Commonly known as: ZOCOR Take 20 mg by mouth at bedtime.   SUMAtriptan 100 MG tablet Commonly known as: IMITREX Take 100 mg by mouth every 2 (two) hours as needed for migraine.   tiZANidine 2 MG tablet Commonly known as: ZANAFLEX Take 4 mg by mouth at bedtime.   Trulicity 3 MG/0.5ML Soaj Generic drug: Dulaglutide Inject 3 mg into the skin once a week. Every Tuesday   Vitamin D (Ergocalciferol) 1.25 MG (50000 UNIT) Caps capsule Commonly known as: DRISDOL Take 50,000 Units by mouth 2 (two) times a week.               Durable Medical Equipment  (From admission, onward)           Start     Ordered   04/17/23 1504  DME 3 n 1  Once        04/17/23 1503   04/17/23 1504  DME Walker rolling  Once       Question Answer Comment  Walker: With 5 Inch Wheels   Patient needs a walker to treat with the following condition Status post total left knee replacement      04/17/23 1503            Diagnostic Studies: DG Knee Left Port Result Date:  04/17/2023 CLINICAL DATA:  Status post total left knee arthroplasty. EXAM: PORTABLE LEFT KNEE - 1-2 VIEW COMPARISON:  Left knee radiographs 02/11/2023 FINDINGS: Interval total left knee arthroplasty. No perihardware lucency is seen to indicate hardware failure or loosening. Expected postoperative changes including intra-articular and subcutaneous air. Moderatejoint effusion. Anterior surgical skin staples. No acute fracture or dislocation. IMPRESSION: Interval total left knee arthroplasty without evidence of hardware failure. Electronically Signed   By: Neita Garnet M.D.   On: 04/17/2023 15:41    Disposition: Discharge disposition: 01-Home or Self Care          Follow-up Information     [facility] Follow up.   Why: [facility] to provide physical therapy visits Contact information: [address] [place] [address] [phone]         [name], MD Follow up in 2 week(s).   Specialty: Orthopedic Surgery Contact information: [address] [place] [address] [phone]                  Signed: [name] 04/20/2023, 1:02 PM

## 2023-04-20 NOTE — Progress Notes (Signed)
Physical Therapy Treatment Patient Details Name: Molly Berry MRN: [id number] DOB: 02-01-1966 Today's Date: 04/20/2023   History of Present Illness 57 yo female presents to therapy s/p L TKA on 04/17/2023 due to failure of conservative measures. Pt PMH includes but is not limited to: hypoxia, opioid dependence, B UE pain, L RTC tear s/p arthroscopic repair, GERD, DM II, fibromyalgia and chronic pain syndrome, HLD, goiter, h/o gastric bypass, anemia, COPD, endometrial ca, depression, anxiety, HTN, pulmonary and thyroid nodules.    PT Comments  Tammy is progressing well this session. Fatigues with activity but is overall at supervision level. Reviewed stairs, safety with mobility and pt feels she is ready to d/c with spouse assisting as needed.     If plan is discharge home, recommend the following: A little help with walking and/or transfers;A little help with bathing/dressing/bathroom;Assistance with cooking/housework;Help with stairs or ramp for entrance;Assist for transportation   Can travel by private vehicle        Equipment Recommendations  Rolling walker (2 wheels) (delivered)    Recommendations for Other Services       Precautions / Restrictions Restrictions LLE Weight Bearing Per Provider Order: Weight bearing as tolerated     Mobility  Bed Mobility   Bed Mobility: Supine to Sit     Supine to sit: Supervision     General bed mobility comments: supervision for safety, pt able to self assist LLE with RLE. bed flat, incr time but no physical assist    Transfers Overall transfer level: Needs assistance Equipment used: Rolling walker (2 wheels) Transfers: Sit to/from Stand Sit to Stand: Supervision           General transfer comment: Cues for safety, hand/LE placement. Increased time.    Ambulation/Gait Ambulation/Gait assistance: Supervision Gait Distance (Feet): 30 Feet Assistive device: Rolling walker (2 wheels) Gait Pattern/deviations: Step-through  pattern, Decreased stride length Gait velocity: decreased     General Gait Details: slow but steady gait. pt declined incr distance since she is going home today. wanted to save hr "body strength"   Stairs Stairs: Yes Stairs assistance: Contact guard assist Stair Management: Two rails, Step to pattern, Forwards Number of Stairs: 3 General stair comments: cues for sequence, technique. good stability, no LOB or knee buckling. pt reports she feels she can get up her steps at home does not need to review further   Wheelchair Mobility     Tilt Bed    Modified Rankin (Stroke Patients Only)       Balance     Sitting balance-Leahy Scale: Good     Standing balance support: During functional activity, Reliant on assistive device for balance Standing balance-Leahy Scale: Fair                              Cognition Arousal: Alert Behavior During Therapy: WFL for tasks assessed/performed Overall Cognitive Status: Within Functional Limits for tasks assessed                                          Exercises Total Joint Exercises Straight Leg Raises: 5 reps, Left, AROM, Limitations Straight Leg Raises Limitations: fatigue,pt wants to save her "strength" Goniometric ROM: grossly 15 degress to 80 degrees flexion, body habitus limiting    General Comments        Pertinent Vitals/Pain Pain Assessment Pain  Assessment: Faces Faces Pain Scale: Hurts even more Pain Location: L posterior knee and calf Pain Descriptors / Indicators: Aching, Dull Pain Intervention(s): Limited activity within patient's tolerance, Monitored during session, Premedicated before session    Home Living                          Prior Function            PT Goals (current goals can now be found in the care plan section) Acute Rehab PT Goals PT Goal Formulation: With patient Time For Goal Achievement: 05/01/23 Potential to Achieve Goals: Fair Progress  towards PT goals: Progressing toward goals    Frequency    7X/week      PT Plan      Co-evaluation              AM-PAC PT "6 Clicks" Mobility   Outcome Measure  Help needed turning from your back to your side while in a flat bed without using bedrails?: A Little Help needed moving from lying on your back to sitting on the side of a flat bed without using bedrails?: A Little Help needed moving to and from a bed to a chair (including a wheelchair)?: A Little Help needed standing up from a chair using your arms (e.g., wheelchair or bedside chair)?: A Little Help needed to walk in hospital room?: A Little Help needed climbing 3-5 steps with a railing? : A Little 6 Click Score: 18    End of Session Equipment Utilized During Treatment: Gait belt Activity Tolerance: Patient tolerated treatment well Patient left: in bed;with call bell/phone within reach;with bed alarm set   PT Visit Diagnosis: Unsteadiness on feet (R26.81);Repeated falls (R29.6);Muscle weakness (generalized) (M62.81);Other abnormalities of gait and mobility (R26.89);History of falling (Z91.81);Difficulty in walking, not elsewhere classified (R26.2);Pain Pain - Right/Left: Left Pain - part of body: Knee     Time: 1009-1030 PT Time Calculation (min) (ACUTE ONLY): 21 min  Charges:    $Gait Training: 8-22 mins PT General Charges $$ ACUTE PT VISIT: 1 Visit                     Slayton Lubitz, PT  Acute Rehab Dept [facility]) [phone]  04/20/2023    [facility] 04/20/2023, 10:41 AM

## 2023-04-22 ENCOUNTER — Telehealth: Payer: Self-pay | Admitting: Physical Medicine & Rehabilitation

## 2023-04-22 NOTE — Telephone Encounter (Signed)
Patient called in and is requesting a call back , she has questions about her pain management , she had knee replacement 12/13

## 2023-04-22 NOTE — Telephone Encounter (Signed)
Stop dilaudid due to hallucinations and was told by Dr. Benjie Karvonen to take 2 every hours of Oxycodone/APAP, started today. Please advice.

## 2023-04-23 ENCOUNTER — Telehealth: Payer: Self-pay

## 2023-04-23 NOTE — Telephone Encounter (Signed)
Patient had knee surgery on last Friday. She would like to discuss other pain medication options. The current pain medication are is not covering  the knee pain.

## 2023-04-24 ENCOUNTER — Telehealth: Payer: Self-pay | Admitting: Orthopaedic Surgery

## 2023-04-24 ENCOUNTER — Other Ambulatory Visit: Payer: Self-pay | Admitting: Orthopaedic Surgery

## 2023-04-24 ENCOUNTER — Telehealth: Payer: Self-pay | Admitting: Physical Medicine & Rehabilitation

## 2023-04-24 MED ORDER — TIZANIDINE HCL 4 MG PO TABS: 4.0000 mg | ORAL_TABLET | Freq: Four times a day (QID) | ORAL | 0 refills | Status: DC | PRN

## 2023-04-24 MED ORDER — OXYCODONE HCL 5 MG PO TABS: 5.0000 mg | ORAL_TABLET | ORAL | 0 refills | Status: DC | PRN

## 2023-04-24 NOTE — Telephone Encounter (Signed)
Received call from [facility] (PT) with [facility] advised  patient's pain level is an 8.  Corrie Dandy said patient is taking Oxycodone 1 every 4 hours and it's not helping with the pain. The number to contact Corrie Dandy is [phone]

## 2023-04-24 NOTE — Telephone Encounter (Signed)
Pt had TKA last Friday. Husband called reporting that pt is in excruciating pain and needs to know what to do or can take. [phone]

## 2023-04-24 NOTE — Telephone Encounter (Signed)
Patient's husband requesting for provider to call him regarding wife, current plan is not working well for patient. Please advise

## 2023-04-24 NOTE — Telephone Encounter (Signed)
I SW pt for more details. Had TKA last Friday. Both sides of knee and thigh are like a squeezing and jabbing pain. She has no swelling, no redness/streaking. Using iceman machine and says she is elevating. Has HHPT unable to do the exercises, she is walking around though. Dr. Benjie Karvonen d/c hydromorphone due to hallucinations. Put her back on oxycodone, taking 1 tablets every 4 hours. Had to take 2 tablets at a time a couple times last night. Cannot get her pain under control.

## 2023-04-24 NOTE — Telephone Encounter (Signed)
Pt informed

## 2023-04-27 ENCOUNTER — Other Ambulatory Visit: Payer: Self-pay | Admitting: Physician Assistant

## 2023-04-27 ENCOUNTER — Telehealth: Payer: Self-pay | Admitting: Specialist

## 2023-04-27 MED ORDER — OXYCODONE HCL 5 MG PO TABS: 5.0000 mg | ORAL_TABLET | Freq: Four times a day (QID) | ORAL | 0 refills | Status: DC | PRN

## 2023-04-27 NOTE — Telephone Encounter (Signed)
Dr. Natale Lay, Molly Berry would like you to call her. She states that she had a rough weekend.  She has found that 2 7.5 mg every 4 hours works best to manage her pain. Molly Berry, MHA, OT/L [phone]

## 2023-04-27 NOTE — Telephone Encounter (Signed)
Patient cannot take dilaudid.   Can patient have refill of pain meds?

## 2023-04-30 ENCOUNTER — Telehealth: Payer: Self-pay | Admitting: *Deleted

## 2023-04-30 NOTE — Telephone Encounter (Signed)
Molly Berry has called back about Dr Benjie Karvonen managing her pain medication. Has not heard back from our office. It looks like othro rx #30 tablets oxy 5 ir on 04/27/23. She is saying we have agreed to manage her post op pain. Please advise/ call her.

## 2023-05-01 ENCOUNTER — Other Ambulatory Visit: Payer: Self-pay | Admitting: Orthopaedic Surgery

## 2023-05-01 ENCOUNTER — Telehealth: Payer: Self-pay

## 2023-05-01 ENCOUNTER — Telehealth: Payer: Self-pay | Admitting: Radiology

## 2023-05-01 NOTE — Telephone Encounter (Signed)
Dr. Ophelia Charter called and spoke with patient and advised.

## 2023-05-01 NOTE — Telephone Encounter (Signed)
Pt of Dr. Magnus Ivan that had left TKR on 04/17/2023. States that she is in pain and will not have enough medication to last her until her appt with Dr. Magnus Ivan on Monday. The pt has had the following refills 04/27/2023 Oxycodone 5 mg #30 04/24/2023 Oxycodone 5 mg #30 04/18/2023 Hydromorphone 4 mg #30- pt states can not take this 04/07/2023 Oxycodone 7.5/325 #120 states that she is not taking this medication right now given by pain management had filled but it is on hold. Husband states that the pt is taking the Oxycodone 5 mg written by Dr. Magnus Ivan only and that she will be out of medication by Saturday. All rx were picked up at pharmacy listed above. [place] Spaniel that they need help and can not wait until Monday. Please advise.

## 2023-05-01 NOTE — Telephone Encounter (Signed)
Patient left voicemail on triage line stating Dr. Magnus Ivan originally sent her home with Dilaudid after TKA on 04/17/2023.  She was unable to take this medication due to it "making her crazy and dilusional".  She states that the medication was then changed (per chart to Oxycodone 5mg ).  She states that Dr. Magnus Ivan had spoken with Dr. Benjie Karvonen (her pain doctor) and they were supposed to have this medication situation decided prior to surgery. She states that she only received a few pills and is going to be out, but they still are not controlling her pain. She is tearful and begging for someone to help her.  Per chart, last rx was sent in 04/27/2023-Oxycodone 5mg  take 1-2 tabs q 6 hours prn #30.  Can you please advise since Dr. Magnus Ivan is out of the office?   CB for patient is [phone]

## 2023-05-04 ENCOUNTER — Ambulatory Visit (INDEPENDENT_AMBULATORY_CARE_PROVIDER_SITE_OTHER): Payer: Commercial Managed Care - HMO | Admitting: Orthopaedic Surgery

## 2023-05-04 ENCOUNTER — Encounter: Payer: Self-pay | Admitting: Orthopaedic Surgery

## 2023-05-04 DIAGNOSIS — Z96652 Presence of left artificial knee joint: Secondary | ICD-10-CM

## 2023-05-04 NOTE — Progress Notes (Signed)
The patient is being seen today for first postoperative visit status post a left total knee replacement.  She is someone who is in chronic pain management so she has had to take Dilaudid to get her through the surgical pain but she is only had to take half of a pill.  That has helped.  She is really pushed herself hard and has actually exceeded our expectations.  On my exam today her extension is almost full and her flexion is just past 90 degrees.  Her calf is soft.  She looks really good.  She even gets up and walks around the room without a walker.  I want her to transition into outpatient physical therapy.  She prefers [facility] in [place].  She can stop her baby aspirin.  When she runs low on Dilaudid she knows to let us know.  Will see her back in 4 weeks to see how she is doing overall from a range of motion standpoint.  I told her I am very proud of how she has done.

## 2023-05-05 ENCOUNTER — Encounter: Payer: Commercial Managed Care - HMO | Admitting: Physical Medicine & Rehabilitation

## 2023-05-07 ENCOUNTER — Other Ambulatory Visit: Payer: Self-pay | Admitting: Orthopaedic Surgery

## 2023-05-07 ENCOUNTER — Telehealth: Payer: Self-pay | Admitting: Orthopaedic Surgery

## 2023-05-07 MED ORDER — HYDROMORPHONE HCL 2 MG PO TABS: 2.0000 mg | ORAL_TABLET | ORAL | 0 refills | Status: DC | PRN

## 2023-05-07 NOTE — Telephone Encounter (Signed)
 Pt's husband requesting a refill of dilaudid. Please send refill to [facility] [place]. Pt phone number is [phone].

## 2023-05-08 ENCOUNTER — Telehealth: Payer: Self-pay | Admitting: Orthopaedic Surgery

## 2023-05-08 ENCOUNTER — Other Ambulatory Visit: Payer: Self-pay | Admitting: Orthopaedic Surgery

## 2023-05-08 MED ORDER — HYDROMORPHONE HCL 2 MG PO TABS: 2.0000 mg | ORAL_TABLET | ORAL | 0 refills | Status: DC | PRN

## 2023-05-08 NOTE — Telephone Encounter (Signed)
 Patient called requesting refill on hydromorphone.

## 2023-05-14 ENCOUNTER — Other Ambulatory Visit: Payer: Self-pay | Admitting: Orthopaedic Surgery

## 2023-05-14 ENCOUNTER — Telehealth: Payer: Self-pay | Admitting: Orthopaedic Surgery

## 2023-05-14 MED ORDER — TIZANIDINE HCL 4 MG PO TABS: 4.0000 mg | ORAL_TABLET | Freq: Four times a day (QID) | ORAL | 0 refills | Status: DC | PRN

## 2023-05-14 MED ORDER — HYDROMORPHONE HCL 2 MG PO TABS: 2.0000 mg | ORAL_TABLET | ORAL | 0 refills | Status: DC | PRN

## 2023-05-14 NOTE — Telephone Encounter (Signed)
 Patient called. She would like a refill on dilaudid and 2MG   Zanaflex. Her cb# [phone]

## 2023-05-20 ENCOUNTER — Other Ambulatory Visit: Payer: Self-pay | Admitting: Orthopaedic Surgery

## 2023-05-20 ENCOUNTER — Telehealth: Payer: Self-pay | Admitting: Orthopaedic Surgery

## 2023-05-20 MED ORDER — TIZANIDINE HCL 2 MG PO TABS: 2.0000 mg | ORAL_TABLET | Freq: Four times a day (QID) | ORAL | 0 refills | Status: DC | PRN

## 2023-05-20 MED ORDER — HYDROMORPHONE HCL 2 MG PO TABS: 2.0000 mg | ORAL_TABLET | ORAL | 0 refills | Status: DC | PRN

## 2023-05-20 NOTE — Telephone Encounter (Signed)
 Pt called about update for refills. Please call pt at [phone].

## 2023-05-20 NOTE — Telephone Encounter (Signed)
 Pt called requesting refill of dilaudid  and tizanidine  she is asking for tizanidine  less mg. Please send to [facility]. Pt [phone].

## 2023-05-21 NOTE — Telephone Encounter (Signed)
LMOM for patient of the below message  

## 2023-05-27 ENCOUNTER — Ambulatory Visit: Payer: Commercial Managed Care - HMO | Admitting: Orthopaedic Surgery

## 2023-05-27 ENCOUNTER — Encounter: Payer: Self-pay | Admitting: Orthopaedic Surgery

## 2023-05-27 DIAGNOSIS — Z96652 Presence of left artificial knee joint: Secondary | ICD-10-CM

## 2023-05-27 NOTE — Progress Notes (Signed)
HPI: [name] returns today follow-up of her left total knee arthroplasty.  She is overall doing well.  She states her range of motion and strength is improving.  She is due to see her pain management specialist in the near future so that they can take over her pain medicine she is on Dilaudid, Neurontin and Zanaflex.  She is asking about getting her right knee done in the near future.  Physical exam: General Well-developed well-nourished female no acute distress.  Ambulates with a cane but is able to give up and down out of  chair on her own walks about the room without the cane.  Left knee full extension flexion 110 degrees no instability valgus varus stressing.  Keloid formation.  No signs of infection or wound dehiscence.  Calf supple nontender dorsiflexion plantarflexion left ankle intact  Impression: Status post left total knee arthroplasty 04/17/2023  Plan: She will continue to work on range of motion strengthening of her knee.  Will see her back in 1 month at that time discussed possible right total knee arthroplasty.  Questions were encouraged and answered at length.  Will have her pain management physician take over pain medicines at this point in time.  Scar tissue mobilization encouraged.  Also the use of Moderma is encouraged.

## 2023-05-28 ENCOUNTER — Encounter
Payer: Commercial Managed Care - HMO | Attending: Physical Medicine & Rehabilitation | Admitting: Physical Medicine & Rehabilitation

## 2023-05-28 ENCOUNTER — Encounter: Payer: Self-pay | Admitting: Physical Medicine & Rehabilitation

## 2023-05-28 VITALS — BP 136/89 | HR 67 | Ht 66.0 in | Wt 224.0 lb

## 2023-05-28 DIAGNOSIS — Z79891 Long term (current) use of opiate analgesic: Secondary | ICD-10-CM | POA: Diagnosis not present

## 2023-05-28 DIAGNOSIS — Z5181 Encounter for therapeutic drug level monitoring: Secondary | ICD-10-CM | POA: Insufficient documentation

## 2023-05-28 DIAGNOSIS — M797 Fibromyalgia: Secondary | ICD-10-CM | POA: Insufficient documentation

## 2023-05-28 DIAGNOSIS — G894 Chronic pain syndrome: Secondary | ICD-10-CM | POA: Insufficient documentation

## 2023-05-28 DIAGNOSIS — M170 Bilateral primary osteoarthritis of knee: Secondary | ICD-10-CM | POA: Diagnosis present

## 2023-05-28 MED ORDER — OXYCODONE-ACETAMINOPHEN 7.5-325 MG PO TABS: 1.5000 | ORAL_TABLET | ORAL | 0 refills | Status: DC | PRN

## 2023-05-28 NOTE — Progress Notes (Addendum)
Subjective:    Patient ID: Molly Berry, female    DOB: 26-Sep-1965, 58 y.o.   MRN: [id number]  HPI  HPI 08/18/22 Molly Berry is a 58 y.o. year old female  who  has a past medical history of Allergy, Anemia, Anxiety, Arthritis, Asthma, Bell palsy, Blood transfusion without reported diagnosis, Breast mass, Chronic kidney disease, Chronic pain syndrome, COPD (chronic obstructive pulmonary disease), Depression, Diabetes mellitus, Endometrial cancer, Fibromyalgia, GERD (gastroesophageal reflux disease), H/O gastric bypass (05/09/2014), Headache, Hyperlipidemia, Hypertension, Hypothyroidism, Multiple pulmonary nodules (11/30/2012), Multiple thyroid nodules, Nephropathy, Pneumonia, PONV (postoperative nausea and vomiting), Pulmonary nodules, Thyroid disease, Ulcer, and Uterine cancer (12/26/2011).   They are presenting to PM&R clinic as a new patient for pain management evaluation.  Molly Berry reports that most of her pain is related to her history of fibromyalgia and osteoarthritis particular of her knees.  She has widespread pain throughout her whole body from fibromyalgia.  She has had knee pain for several years and her knees are her worst area of pain. It was initially worse in her right knee, then her left knee became more severe.  She reports she had cortisone injections with slight benefit however she cannot get gel injections approved by insurance.  She would like to have knee surgery and is followed by orthopedics most recently seen by Legrand Como, PA.  She is working on weight loss in order to be able to qualify for surgery.  She was previously seen by [facility].  Her pain was previously managed with oxycodone 10 mg 3 times a day.  She reports she was with this practice for about 7 years.  She does report she was discharged after she took a tramadol by a family member gave her when she had had multiple procedures and surgeries for renal stones.  Since this time her PCP has been  prescribing her oxycodone 10 mg.     No red flags for back pain endorsed in Hx or ROS   Medications tried: Nsaids meloxicam helps, rare use due to concern for kidneys Tylenol - helps slightly  Opiates  oxycodone 10mg  helps, has used this for many years Gabapentin 1200mg  BID  Lyrica- reports reason she can't take it but doesn't remember what that was  Lamictal for mood  Buprenorphine - bad feeling  Tramadol didn't help  TCAs  - didn't help SNRIs  cymbalta, did not tolerate it  Other   Tizanidine/robaxin slightly helps    Other treatments: PT/OT  - helped in the past, she likes "integrative therapy" TENs unit - made her sore  Injections - knee cortisone   Interval history 09/15/2022 Patient also reports she has been doing well overall regarding her pain control.  Her pain has been severe in her right back and flank recently.  She reports follow-up with urology today in clinic, says she has had multiple issues with kidney stones.  She also had a UA ordered today.  She was started on Macrobid.  Prior to this her pain was much better controlled on Percocet 7.5 mg 4 times a day.  This was providing more consistent pain control than Percocet 10 mg 3 times a day.  Patient reports she got message from [facility] however needs to call them back.   Interval history 09/15/2022 Patient also reports she has been doing well overall regarding her pain control.  Her pain has been severe in her right back and flank recently.  She reports follow-up with urology today in clinic,  says she has had multiple issues with kidney stones.  She also had a UA ordered today.  She was started on Macrobid.  Prior to this her pain was much better controlled on Percocet 7.5 mg 4 times a day.  This was providing more consistent pain control than Percocet 10 mg 3 times a day.  Patient reports she got message from [facility] however needs to call them back.   Interval history 03/02/23 Molly Berry is here for follow-up regarding her  chronic pain.  She reports that pain continues to be poorly controlled.  Her worst pain is at her knees however she has pain all throughout her body.  Percocet 7.5 mg helps some days, other days her pain continues to be very severe.  No side effects with the medication.  She is planning on having knee replacement surgery with Dr. Magnus Ivan.  Interval history 04/07/23 Patient here for follow-up regarding her chronic pain.  Percocet continues to help keep her pain more tolerable.  She does have left TKA scheduled with Dr. Magnus Ivan on 04/17/2023.  She is a little concerned about postoperative pain.  She has been limited in her ability to start physical therapy because her truck is having mechanical issues.  Interval history 05/28/2023 Patient reports she is doing well after her knee replacement.  She was seen by Dr. Magnus Ivan yesterday, note indicates that Dr. Magnus Ivan would like her to follow-up with pain management for continued pain treatment.  She has been doing fairly well on Dilaudid 2 mg every 4 hours as needed.  Patient does report that she thinks this has been causing her GI upset.  She did not have the symptoms with oxycodone.  She has run out of Dilaudid yesterday and restarted with Percocet 7.5 2 tabs every 4 hours.  She reports she tried 1 tab of Percocet 7.5 but she cannot tolerate the pain.  She is not having any side effects with Percocet.  She reports that Dr. Magnus Ivan is overall happy with her progress.  She is considering having her other knee replaced as well.  Pain Inventory Average Pain 8 Pain Right Now 6 My pain is constant and aching  In the last 24 hours, has pain interfered with the following? General activity 8 Relation with others 8 Enjoyment of life 9 What TIME of day is your pain at its worst? morning , daytime, evening, and night Sleep (in general) Fair  Pain is worse with: bending and standing Pain improves with: rest and medication Relief from Meds: 5  Family History   Problem Relation Age of Onset   Pneumonia Mother        Deceased   Arthritis Mother    Asthma Mother    Cancer Mother        pancreatic   COPD Mother    Depression Mother    Diabetes Mother    Kidney disease Mother    Liver cancer Father        Living   Arthritis Father    Cancer Father        liver, prostate   COPD Father    Depression Father    Stroke Father    Heart disease Maternal Grandmother    Mental illness Maternal Grandmother    Breast cancer Maternal Grandmother    Alcohol abuse Paternal Grandfather    Breast cancer Paternal Aunt    Anesthesia problems Neg Hx    Hypotension Neg Hx    Malignant hyperthermia Neg Hx  Pseudochol deficiency Neg Hx    Colon cancer Neg Hx    Social History   Socioeconomic History   Marital status: Married    Spouse name: Fayrene Fearing "Rusty"   Number of children: Not on file   Years of education: 16   Highest education level: Not on file  Occupational History   Occupation: Curator employed)    Employer: NOT EMPLOYED  Tobacco Use   Smoking status: Never   Smokeless tobacco: Never  Vaping Use   Vaping status: Never Used  Substance and Sexual Activity   Alcohol use: No    Alcohol/week: 0.0 standard drinks of alcohol   Drug use: No   Sexual activity: Yes    Birth control/protection: None, Surgical  Other Topics Concern   Not on file  Social History Narrative   Lives with husband and 2 sons 22 and 15. (adopted)    Wants to work but says she is not dependable.  She does do volunteer work.    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  Working on disability.     Education: bachelors degree.   Disabled because of fibromyalgia and depression since 2010   Social Drivers of Health   Financial Resource Strain: Low Risk  (05/26/2023)   Received from [facility]   Overall Financial Resource Strain (CARDIA)    Difficulty of Paying Living Expenses: Not hard at all  Food Insecurity: No Food Insecurity (05/26/2023)    [facility]   Hunger Vital Sign    Worried About Running Out of Food in the Last Year: Never true    Ran Out of Food in the Last Year: Never true  Transportation Needs: No Transportation Needs (05/26/2023)   Received from [facility] - Transportation    Lack of Transportation (Medical): No    Lack of Transportation (Non-Medical): No  Physical Activity: Inactive (03/19/2021)   Received from [facility], [facility]   Exercise Vital Sign    Days of Exercise per Week: 0 days    Minutes of Exercise per Session: 0 min  Stress: Stress Concern Present (03/19/2021)   Received from [facility] - Occupational Stress Questionnaire    Feeling of Stress : Very much  Social Connections: Unknown (09/15/2021)   Received from [facility], [facility]   Social Network    Social Network: Not on file   Past Surgical History:  Procedure Laterality Date   ABDOMINAL HYSTERECTOMY     endometrial cancer   CHOLECYSTECTOMY     COLONOSCOPY WITH PROPOFOL N/A 01/19/2014   [id number]   CYSTOSCOPY W/ URETERAL STENT PLACEMENT Bilateral 02/07/2022   Procedure: CYSTOSCOPY WITH RETROGRADE PYELOGRAM/RIGHT URETERAL STENT PLACEMENT WITH POSSIBLE LEFT;  Surgeon: Jannifer Hick, MD;  Location: [facility];  Service: Urology;  Laterality: Bilateral;   CYSTOSCOPY WITH RETROGRADE PYELOGRAM, URETEROSCOPY AND STENT PLACEMENT Bilateral 03/06/2022   Procedure: CYSTOSCOPY WITH RETROGRADE PYELOGRAM, URETEROSCOPY AND STENT EXCHANGE;  Surgeon: Malen Gauze, MD;  Location: [facility];  Service: Urology;  Laterality: Bilateral;   DILATION AND CURETTAGE OF UTERUS  12 yrs ago   DILATION AND CURETTAGE OF UTERUS  06/18/2011   Procedure: DILATATION AND CURETTAGE;  Surgeon: Lazaro Arms, MD;  Location: [facility];  Service: Gynecology;  Laterality: N/A;  Suction Dilation and Curettage   ESOPHAGOGASTRODUODENOSCOPY N/A 10/12/2013   Dr.  Rourk:anastomotic ulcer likely cause of bleeding. likely ischemic    ESOPHAGOGASTRODUODENOSCOPY (EGD)  WITH PROPOFOL N/A 01/19/2014   [id number]   EXTRACORPOREAL SHOCK WAVE LITHOTRIPSY Left 01/11/2018   Procedure: LEFT EXTRACORPOREAL SHOCK WAVE LITHOTRIPSY (ESWL);  Surgeon: Crista Elliot, MD;  [facility]: [facility];  Service: Urology;  Laterality: Left;   EXTRACORPOREAL SHOCK WAVE LITHOTRIPSY Left 01/14/2018   Procedure: LEFT EXTRACORPOREAL SHOCK WAVE LITHOTRIPSY (ESWL);  Surgeon: Malen Gauze, MD;  Location: [facility];  Service: Urology;  Laterality: Left;  75 MINS  W/ MAC   GASTRIC BYPASS  2013   [facility]   HOLMIUM LASER APPLICATION Bilateral 03/06/2022   Procedure: HOLMIUM LASER APPLICATION;  Surgeon: Malen Gauze, MD;  Location: [facility];  Service: Urology;  Laterality: Bilateral;   HYSTEROSCOPY WITH D & C  06/18/2011   Procedure: DILATATION AND CURETTAGE /HYSTEROSCOPY;  Surgeon: Lazaro Arms, MD;  Location: [facility];  Service: Gynecology;  Laterality: N/A;   LITHOTRIPSY     paniculectomy     SHOULDER ARTHROSCOPY WITH SUBACROMIAL DECOMPRESSION AND OPEN ROTATOR C Left 07/16/2021   Procedure: LEFT ARTHROSCOPIC SUBACROMIAL DECOMPRESSION, MINI OPEN ROTATOR CUFF TEAR REPAIR, BICEPS TENOTOMY;  Surgeon: Valeria Batman, MD;  [facility];  Service: Orthopedics;  Laterality: Left;   STONE EXTRACTION WITH BASKET Bilateral 03/06/2022   Procedure: STONE EXTRACTION WITH BASKET;  Surgeon: Malen Gauze, MD;  [facility]: [facility];  Service: Urology;  Laterality: Bilateral;   TOTAL KNEE ARTHROPLASTY Left 04/17/2023   Procedure: LEFT TOTAL KNEE ARTHROPLASTY;  Surgeon: [name], MD;  [facility];  Service: Orthopedics;  Laterality: Left;   TRIGGER FINGER RELEASE     WISDOM TOOTH EXTRACTION     Past Surgical History:  Procedure Laterality Date   ABDOMINAL HYSTERECTOMY     endometrial cancer   CHOLECYSTECTOMY     COLONOSCOPY WITH PROPOFOL N/A 01/19/2014    [id number]   CYSTOSCOPY W/ URETERAL STENT PLACEMENT Bilateral 02/07/2022   Procedure: CYSTOSCOPY WITH RETROGRADE PYELOGRAM/RIGHT URETERAL STENT PLACEMENT WITH POSSIBLE LEFT;  Surgeon: Jannifer Hick, MD;  [facility];  Service: Urology;  Laterality: Bilateral;   CYSTOSCOPY WITH RETROGRADE PYELOGRAM, URETEROSCOPY AND STENT PLACEMENT Bilateral 03/06/2022   Procedure: CYSTOSCOPY WITH RETROGRADE PYELOGRAM, URETEROSCOPY AND STENT EXCHANGE;  Surgeon: Malen Gauze, MD;  [facility];  Service: Urology;  Laterality: Bilateral;   DILATION AND CURETTAGE OF UTERUS  12 yrs ago   DILATION AND CURETTAGE OF UTERUS  06/18/2011   Procedure: DILATATION AND CURETTAGE;  Surgeon: Lazaro Arms, MD;  [facility];  Service: Gynecology;  Laterality: N/A;  Suction Dilation and Curettage   ESOPHAGOGASTRODUODENOSCOPY N/A 10/12/2013   Dr. Rourk:anastomotic ulcer likely cause of bleeding. likely ischemic    ESOPHAGOGASTRODUODENOSCOPY (EGD) WITH PROPOFOL N/A 01/19/2014   [id number]   EXTRACORPOREAL SHOCK WAVE LITHOTRIPSY Left 01/11/2018   Procedure: LEFT EXTRACORPOREAL SHOCK WAVE LITHOTRIPSY (ESWL);  Surgeon: Crista Elliot, MD;  [facility];  Service: Urology;  Laterality: Left;   EXTRACORPOREAL SHOCK WAVE LITHOTRIPSY Left 01/14/2018   Procedure: LEFT EXTRACORPOREAL SHOCK WAVE LITHOTRIPSY (ESWL);  Surgeon: Malen Gauze, MD;  [facility];  Service: Urology;  Laterality: Left;  75 MINS  W/ MAC   GASTRIC BYPASS  2013   [facility]   HOLMIUM LASER APPLICATION Bilateral 03/06/2022   Procedure: HOLMIUM LASER APPLICATION;  Surgeon: Malen Gauze, MD;  [facility];  Service: Urology;  Laterality: Bilateral;   HYSTEROSCOPY WITH D & C  06/18/2011   Procedure: DILATATION AND CURETTAGE /HYSTEROSCOPY;  Surgeon: Lazaro Arms, MD;  [facility]: [facility];  Service: Gynecology;  Laterality: N/A;   LITHOTRIPSY     paniculectomy     SHOULDER ARTHROSCOPY WITH SUBACROMIAL DECOMPRESSION AND OPEN  ROTATOR C Left 07/16/2021   Procedure: LEFT ARTHROSCOPIC SUBACROMIAL DECOMPRESSION, MINI OPEN ROTATOR CUFF TEAR REPAIR, BICEPS TENOTOMY;  Surgeon: Valeria Batman, MD;  [facility];  Service: Orthopedics;  Laterality: Left;   STONE EXTRACTION WITH BASKET Bilateral 03/06/2022   Procedure: STONE EXTRACTION WITH BASKET;  Surgeon: Malen Gauze, MD;  [facility];  Service: Urology;  Laterality: Bilateral;   TOTAL KNEE ARTHROPLASTY Left 04/17/2023   Procedure: LEFT TOTAL KNEE ARTHROPLASTY;  Surgeon: [name], MD;  [facility];  Service: Orthopedics;  Laterality: Left;   TRIGGER FINGER RELEASE     WISDOM TOOTH EXTRACTION     Past Medical History:  Diagnosis Date   Allergy    Anemia    Anxiety    Arthritis    knees, multiple joints   Asthma    Bell palsy    Blood transfusion without reported diagnosis    Breast mass    lt breast mass x's 3 years increased in size   Chronic kidney disease    Chronic pain syndrome    COPD (chronic obstructive pulmonary disease) (HCC)    Depression    Diabetes mellitus    Endometrial cancer (HCC)    Fibromyalgia    GERD (gastroesophageal reflux disease)    H/O gastric bypass 05/09/2014   At [facility]   Headache    mirgraines   Heart murmur    Hyperlipidemia    Hypertension    Hypothyroidism    Multiple pulmonary nodules 11/30/2012   Followed in Pulmonary clinic/ [facility]/ [place]  - See CT abd  10/29/12  New right lower lobe pulmonary nodularity, primarily ground-  glass in density. This could reflect an inflammatory process,  although follow-up is necessary to exclude atypical neoplasm. Full  chest CT should be considered to evaluate for other pulmonary  findings.      Multiple thyroid nodules    Nephropathy    Pneumonia    PONV (postoperative nausea and vomiting)    Pulmonary nodules    Thyroid disease    Ulcer    Uterine cancer (HCC) 12/26/2011   Stage 1, grade 1, S/P hysterectomy initially by robotic  technique and then salping[facility]oophorectomy at [facility] receiving no postoperative treatment.   BP 136/89   Pulse 67   Ht 5\' 6"  (1.676 m)   Wt 224 lb (101.6 kg)   LMP 05/06/2011   SpO2 96%   BMI 36.15 kg/m   Opioid Risk Score:   Fall Risk Score:  `1  Depression screen [facility] 2/9     04/07/2023   10:25 AM 03/02/2023   10:20 AM 01/02/2023    1:45 PM 09/15/2022   11:06 AM 08/18/2022    2:11 PM 10/30/2016    4:49 PM 07/07/2016    2:11 PM  Depression screen PHQ 2/9  Decreased Interest 1 0 1 2 2  0 0  Down, Depressed, Hopeless 1 0 1 2 2  0 0  PHQ - 2 Score 2 0 2 4 4  0 0  Altered sleeping     3    Tired, decreased energy     2    Change in appetite     2    Feeling bad or failure about yourself      2    Trouble concentrating  2    Moving slowly or fidgety/restless     0    Suicidal thoughts     0    PHQ-9 Score     15    Difficult doing work/chores     Extremely dIfficult        Review of Systems  Musculoskeletal:  Positive for back pain, gait problem and neck pain.       B/L hand, knees,  right foot pain, left shoulder  All other systems reviewed and are negative.      Objective:   Physical Exam   Gen: no distress, normal appearing HEENT: oral mucosa pink and moist, NCAT Chest: normal effort, normal rate of breathing Abd: soft, non-distended Ext: no edema Psych:  pleasant and appropriate  Skin: intact Neuro: Alert and awake, follows commands, cranial nerves II through XII grossly intact DTR normal and symmetric No focal motor or sensory deficits noted No dysmetria or ataxia Musculoskeletal: No knee erythema, warmth or effusion Bilateral knee joint line tenderness medial and lateral, left greater than right TTP lumbar and cervical paraspinal muscles, shoulders, periscapular muscles, elbows, greater trochanters, ankles Slightly decreased L-spine and C-spine ROM in all directions She is able to fully extend her left knee.     Xray L knee result 03/10/22 "Three-view  radiographs of the left knee were obtained today.  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  She has periarticular  osteophytes no acute fractures are noted "   Xray L knee result 12/02/21 " Radiographs of her right knee in 3 projections were reviewed today.  She  has tricompartmental arthritis with most changes in the medial compartment  with near bone-on-bone.  Also patellofemoral sclerosis and osteophyte  formation no acute fractures "        Assessment & Plan:   Knee OA bilateral -Foods for pain list provided -Could consider genicular nerve block if she does not have surgery -S/p TKA by Dr. Magnus Ivan  04/17/23 -ORT low  -Continue UDS and pill counts.  Continue PDMP monitoring.  Pain contract completed prior visit. -Discussed bringing pill bottle with any medications even if empty to all appointments  -Will decrease her oxycodone dose to Percocet 7.5 mg 1.5 tabs every 4 hours as needed.  10 days ordered #90.  Will try to wean back down gradually to her prior dose of Percocet 7.5 mg every 6 hours as needed. -Continue tizanidine as needed -Continue gabapentin 1200 mg twice daily -Consider reconsult aquatic therapy at a later time   Right back and flank pain with Hx of Kidney stones -Improved, continue follow-up with urology as directed  06/08/23 decreased percocet to 10mg  q4h prn

## 2023-06-01 LAB — TOXASSURE SELECT,+ANTIDEPR,UR

## 2023-06-08 ENCOUNTER — Telehealth: Payer: Self-pay | Admitting: *Deleted

## 2023-06-08 MED ORDER — OXYCODONE-ACETAMINOPHEN 10-325 MG PO TABS: 1.0000 | ORAL_TABLET | ORAL | 0 refills | Status: DC | PRN

## 2023-06-08 NOTE — Addendum Note (Signed)
Addended by: Fanny Dance on: 06/08/2023 06:28 PM   Modules accepted: Orders

## 2023-06-08 NOTE — Telephone Encounter (Signed)
Molly Berry called for his wife Molly Berry about a refill on the medication that was being managed for her. They are asking for a call back from Dr Benjie Karvonen  to discuss dosing @ [phone], or [phone].

## 2023-06-11 ENCOUNTER — Encounter: Payer: Self-pay | Admitting: Physical Medicine & Rehabilitation

## 2023-06-11 ENCOUNTER — Telehealth: Payer: Self-pay | Admitting: Physical Medicine & Rehabilitation

## 2023-06-11 NOTE — Telephone Encounter (Signed)
 Patient called in and left voicemail states rx sent- needs PA

## 2023-06-17 ENCOUNTER — Encounter (HOSPITAL_COMMUNITY): Payer: Self-pay | Admitting: Hematology and Oncology

## 2023-06-17 MED ORDER — OXYCODONE-ACETAMINOPHEN 10-325 MG PO TABS
1.0000 | ORAL_TABLET | ORAL | 0 refills | Status: DC | PRN
Start: 2023-06-17 — End: 2023-06-19

## 2023-06-18 NOTE — Addendum Note (Signed)
Addended by: Fanny Dance on: 06/18/2023 12:07 AM   Modules accepted: Orders

## 2023-06-19 MED ORDER — OXYCODONE-ACETAMINOPHEN 10-325 MG PO TABS: 1.0000 | ORAL_TABLET | ORAL | 0 refills | Status: DC | PRN

## 2023-06-22 NOTE — Telephone Encounter (Addendum)
Need PA for Oxycodone [employer] ID #[id number] Rxbin [id number] PCN [id number] Name on card Molly Berry

## 2023-06-24 ENCOUNTER — Encounter: Payer: Commercial Managed Care - HMO | Admitting: Orthopaedic Surgery

## 2023-06-24 NOTE — Telephone Encounter (Signed)
Prior auth submitted to [employer] for oxycodone acetaminophen 10/325.

## 2023-06-25 NOTE — Telephone Encounter (Signed)
Outcome Approved on February 19 by [facility] NCPDP 2017 Your PA request has been approved. Additional information will be provided in the approval communication. (Message 1145)

## 2023-06-30 ENCOUNTER — Encounter (HOSPITAL_COMMUNITY): Payer: Self-pay | Admitting: Hematology and Oncology

## 2023-06-30 ENCOUNTER — Encounter: Payer: 59 | Attending: Physical Medicine & Rehabilitation | Admitting: Physical Medicine & Rehabilitation

## 2023-06-30 ENCOUNTER — Encounter: Payer: Self-pay | Admitting: Physical Medicine & Rehabilitation

## 2023-06-30 VITALS — BP 91/68 | HR 73 | Ht 66.0 in | Wt 222.0 lb

## 2023-06-30 DIAGNOSIS — Z79891 Long term (current) use of opiate analgesic: Secondary | ICD-10-CM | POA: Insufficient documentation

## 2023-06-30 DIAGNOSIS — G894 Chronic pain syndrome: Secondary | ICD-10-CM | POA: Insufficient documentation

## 2023-06-30 DIAGNOSIS — M170 Bilateral primary osteoarthritis of knee: Secondary | ICD-10-CM | POA: Diagnosis not present

## 2023-06-30 DIAGNOSIS — M797 Fibromyalgia: Secondary | ICD-10-CM | POA: Diagnosis not present

## 2023-06-30 MED ORDER — OXYCODONE-ACETAMINOPHEN 7.5-325 MG PO TABS: 1.0000 | ORAL_TABLET | ORAL | 0 refills | Status: DC | PRN

## 2023-06-30 NOTE — Progress Notes (Signed)
 Subjective:    Patient ID: Molly Berry, female    DOB: [birth year], 58 y.o.   MRN: [id number]  HPI  HPI 08/18/22 Molly Berry is a 58 y.o. year old female  who  has a past medical history of Allergy, Anemia, Anxiety, Arthritis, Asthma, Bell palsy, Blood transfusion without reported diagnosis, Breast mass, Chronic kidney disease, Chronic pain syndrome, COPD (chronic obstructive pulmonary disease), Depression, Diabetes mellitus, Endometrial cancer, Fibromyalgia, GERD (gastroesophageal reflux disease), H/O gastric bypass (05/09/2014), Headache, Hyperlipidemia, Hypertension, Hypothyroidism, Multiple pulmonary nodules (11/30/2012), Multiple thyroid nodules, Nephropathy, Pneumonia, PONV (postoperative nausea and vomiting), Pulmonary nodules, Thyroid disease, Ulcer, and Uterine cancer (12/26/2011).   They are presenting to PM&R clinic as a new patient for pain management evaluation.  Ms. Molly Berry reports that most of her pain is related to her history of fibromyalgia and osteoarthritis particular of her knees.  She has widespread pain throughout her whole body from fibromyalgia.  She has had knee pain for several years and her knees are her worst area of pain. It was initially worse in her right knee, then her left knee became more severe.  She reports she had cortisone injections with slight benefit however she cannot get gel injections approved by insurance.  She would like to have knee surgery and is followed by orthopedics most recently seen by Legrand Como, PA.  She is working on weight loss in order to be able to qualify for surgery.  She was previously seen by [facility] pain management.  Her pain was previously managed with oxycodone 10 mg 3 times a day.  She reports she was with this practice for about 7 years.  She does report she was discharged after she took a tramadol by a family member gave her when she had had multiple procedures and surgeries for renal stones.  Since this time her PCP has been  prescribing her oxycodone 10 mg.     No red flags for back pain endorsed in Hx or ROS   Medications tried: Nsaids meloxicam helps, rare use due to concern for kidneys Tylenol - helps slightly  Opiates  oxycodone 10mg  helps, has used this for many years Gabapentin 1200mg  BID  Lyrica- reports reason she can't take it but doesn't remember what that was  Lamictal for mood  Buprenorphine - bad feeling  Tramadol didn't help  TCAs  - didn't help SNRIs  cymbalta, did not tolerate it  Other   Tizanidine/robaxin slightly helps    Other treatments: PT/OT  - helped in the past, she likes "integrative therapy" TENs unit - made her sore  Injections - knee cortisone   Interval history 09/15/2022 Patient also reports she has been doing well overall regarding her pain control.  Her pain has been severe in her right back and flank recently.  She reports follow-up with urology today in clinic, says she has had multiple issues with kidney stones.  She also had a UA ordered today.  She was started on Macrobid.  Prior to this her pain was much better controlled on Percocet 7.5 mg 4 times a day.  This was providing more consistent pain control than Percocet 10 mg 3 times a day.  Patient reports she got message from [facility] however needs to call them back.   Interval history 09/15/2022 Patient also reports she has been doing well overall regarding her pain control.  Her pain has been severe in her right back and flank recently.  She reports follow-up with urology today in clinic,  says she has had multiple issues with kidney stones.  She also had a UA ordered today.  She was started on Macrobid.  Prior to this her pain was much better controlled on Percocet 7.5 mg 4 times a day.  This was providing more consistent pain control than Percocet 10 mg 3 times a day.  Patient reports she got message from [facility] however needs to call them back.   Interval history 03/02/23 Ms. Molly Berry is here for follow-up regarding her  chronic pain.  She reports that pain continues to be poorly controlled.  Her worst pain is at her knees however she has pain all throughout her body.  Percocet 7.5 mg helps some days, other days her pain continues to be very severe.  No side effects with the medication.  She is planning on having knee replacement surgery with Dr. Magnus Ivan.  Interval history 04/07/23 Patient here for follow-up regarding her chronic pain.  Percocet continues to help keep her pain more tolerable.  She does have left TKA scheduled with Dr. Magnus Ivan on 04/17/2023.  She is a little concerned about postoperative pain.  She has been limited in her ability to start physical therapy because her truck is having mechanical issues.  Interval history 05/28/2023 Patient reports she is doing well after her knee replacement.  She was seen by Dr. Magnus Ivan yesterday, note indicates that Dr. Magnus Ivan would like her to follow-up with pain management for continued pain treatment.  She has been doing fairly well on Dilaudid 2 mg every 4 hours as needed.  Patient does report that she thinks this has been causing her GI upset.  She did not have the symptoms with oxycodone.  She has run out of Dilaudid yesterday and restarted with Percocet 7.5 2 tabs every 4 hours.  She reports she tried 1 tab of Percocet 7.5 but she cannot tolerate the pain.  She is not having any side effects with Percocet.  She reports that Dr. Magnus Ivan is overall happy with her progress.  She is considering having her other knee replaced as well.  Interval history 06/30/23 Patient reports her knee pain is doing better.  She is ready to decrease Percocet to 7.5 dose from the Percocet 10 dose.  She would also like to lower this for when she decides to have her other knee replaced.  She is not having any side effects with the medication.  She continues to have a lot of pain throughout her body.  She continues to be limited in her activity level.  We discussed trying tai chi, even  for about 5 minutes a day and potentially working up.  We also discussed trying aquatic therapy unfortunately she is not close to aquatic therapy center.  She might try joining the [facility] where they have a pool where she can do exercises on her own or potentially do water aerobics there.   She had problems with prior Auth for the medication but was able to get the Percocet.   Pain Inventory Average Pain 8 Pain Right Now 8 My pain is constant and aching  In the last 24 hours, has pain interfered with the following? General activity 10 Relation with others 0 Enjoyment of life 10 What TIME of day is your pain at its worst? morning , daytime, evening, and night Sleep (in general) Fair  Pain is worse with: bending and standing Pain improves with: rest and medication Relief from Meds: 5  Family History  Problem Relation Age of Onset  Pneumonia Mother        Deceased   Arthritis Mother    Asthma Mother    Cancer Mother        pancreatic   COPD Mother    Depression Mother    Diabetes Mother    Kidney disease Mother    Liver cancer Father        Living   Arthritis Father    Cancer Father        liver, prostate   COPD Father    Depression Father    Stroke Father    Heart disease Maternal Grandmother    Mental illness Maternal Grandmother    Breast cancer Maternal Grandmother    Alcohol abuse Paternal Grandfather    Breast cancer Paternal Aunt    Anesthesia problems Neg Hx    Hypotension Neg Hx    Malignant hyperthermia Neg Hx    Pseudochol deficiency Neg Hx    Colon cancer Neg Hx    Social History   Socioeconomic History   Marital status: Married    Spouse name: Fayrene Fearing "Rusty"   Number of children: Not on file   Years of education: 16   Highest education level: Not on file  Occupational History   Occupation: Curator employed)    Employer: NOT EMPLOYED  Tobacco Use   Smoking status: Never   Smokeless tobacco: Never  Vaping Use   Vaping status: Never Used   Substance and Sexual Activity   Alcohol use: No    Alcohol/week: 0.0 standard drinks of alcohol   Drug use: No   Sexual activity: Yes    Birth control/protection: None, Surgical  Other Topics Concern   Not on file  Social History Narrative   Lives with husband and 2 sons 75 and 15. (adopted)    Wants to work but says she is not dependable.  She does do volunteer work.    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  Working on disability.     Education: bachelors degree.   Disabled because of fibromyalgia and depression since 2010   Social Drivers of Health   Financial Resource Strain: Low Risk  (05/26/2023)   Received from [facility]   Overall Financial Resource Strain (CARDIA)    Difficulty of Paying Living Expenses: Not hard at all  Food Insecurity: No Food Insecurity (05/26/2023)   Received from [facility]   Hunger Vital Sign    Worried About Running Out of Food in the Last Year: Never true    Ran Out of Food in the Last Year: Never true  Transportation Needs: No Transportation Needs (05/26/2023)   Received from [facility] - Transportation    Lack of Transportation (Medical): No    Lack of Transportation (Non-Medical): No  Physical Activity: Inactive (03/19/2021)   Received from [facility], [facility]   Exercise Vital Sign    Days of Exercise per Week: 0 days    Minutes of Exercise per Session: 0 min  Stress: Stress Concern Present (03/19/2021)   Received from [facility], [facility] - Occupational Stress Questionnaire    Feeling of Stress : Very much  Social Connections: Unknown (09/15/2021)   Received from [facility], [facility]   Social Network    Social Network: Not on file   Past Surgical History:  Procedure Laterality Date   ABDOMINAL HYSTERECTOMY     endometrial cancer   CHOLECYSTECTOMY  COLONOSCOPY WITH PROPOFOL N/A 01/19/2014   WGN:[id number]   CYSTOSCOPY W/ URETERAL STENT  PLACEMENT Bilateral 02/07/2022   Procedure: CYSTOSCOPY WITH RETROGRADE PYELOGRAM/RIGHT URETERAL STENT PLACEMENT WITH POSSIBLE LEFT;  Surgeon: Jannifer Hick, MD;  [facility]: [facility];  Service: Urology;  Laterality: Bilateral;   CYSTOSCOPY WITH RETROGRADE PYELOGRAM, URETEROSCOPY AND STENT PLACEMENT Bilateral 03/06/2022   Procedure: CYSTOSCOPY WITH RETROGRADE PYELOGRAM, URETEROSCOPY AND STENT EXCHANGE;  Surgeon: Malen Gauze, MD;  Location: [facility];  Service: Urology;  Laterality: Bilateral;   DILATION AND CURETTAGE OF UTERUS  12 yrs ago   DILATION AND CURETTAGE OF UTERUS  06/18/2011   Procedure: DILATATION AND CURETTAGE;  Surgeon: Lazaro Arms, MD;  Location: [facility];  Service: Gynecology;  Laterality: N/A;  Suction Dilation and Curettage   ESOPHAGOGASTRODUODENOSCOPY N/A 10/12/2013   Dr. Rourk:anastomotic ulcer likely cause of bleeding. likely ischemic    ESOPHAGOGASTRODUODENOSCOPY (EGD) WITH PROPOFOL N/A 01/19/2014   ION:[id number]   EXTRACORPOREAL SHOCK WAVE LITHOTRIPSY Left 01/11/2018   Procedure: LEFT EXTRACORPOREAL SHOCK WAVE LITHOTRIPSY (ESWL);  Surgeon: Crista Elliot, MD;  [facility];  Service: Urology;  Laterality: Left;   EXTRACORPOREAL SHOCK WAVE LITHOTRIPSY Left 01/14/2018   Procedure: LEFT EXTRACORPOREAL SHOCK WAVE LITHOTRIPSY (ESWL);  Surgeon: Malen Gauze, MD;  [facility]: [facility];  Service: Urology;  Laterality: Left;  75 MINS  W/ MAC   GASTRIC BYPASS  2013   [facility]   HOLMIUM LASER APPLICATION Bilateral 03/06/2022   Procedure: HOLMIUM LASER APPLICATION;  Surgeon: Malen Gauze, MD;  Location: [facility];  Service: Urology;  Laterality: Bilateral;   HYSTEROSCOPY WITH D & C  06/18/2011   Procedure: DILATATION AND CURETTAGE /HYSTEROSCOPY;  Surgeon: Lazaro Arms, MD;  [facility]: [facility];  Service: Gynecology;  Laterality: N/A;   LITHOTRIPSY     paniculectomy     SHOULDER ARTHROSCOPY WITH SUBACROMIAL DECOMPRESSION AND OPEN ROTATOR C Left 07/16/2021   Procedure: LEFT  ARTHROSCOPIC SUBACROMIAL DECOMPRESSION, MINI OPEN ROTATOR CUFF TEAR REPAIR, BICEPS TENOTOMY;  Surgeon: Valeria Batman, MD;  [facility];  Service: Orthopedics;  Laterality: Left;   STONE EXTRACTION WITH BASKET Bilateral 03/06/2022   Procedure: STONE EXTRACTION WITH BASKET;  Surgeon: Malen Gauze, MD;  [facility];  Service: Urology;  Laterality: Bilateral;   TOTAL KNEE ARTHROPLASTY Left 04/17/2023   Procedure: LEFT TOTAL KNEE ARTHROPLASTY;  Surgeon: [name], MD;  [facility];  Service: Orthopedics;  Laterality: Left;   TRIGGER FINGER RELEASE     WISDOM TOOTH EXTRACTION     Past Surgical History:  Procedure Laterality Date   ABDOMINAL HYSTERECTOMY     endometrial cancer   CHOLECYSTECTOMY     COLONOSCOPY WITH PROPOFOL N/A 01/19/2014   [id number]   CYSTOSCOPY W/ URETERAL STENT PLACEMENT Bilateral 02/07/2022   Procedure: CYSTOSCOPY WITH RETROGRADE PYELOGRAM/RIGHT URETERAL STENT PLACEMENT WITH POSSIBLE LEFT;  Surgeon: Jannifer Hick, MD;  [facility];  Service: Urology;  Laterality: Bilateral;   CYSTOSCOPY WITH RETROGRADE PYELOGRAM, URETEROSCOPY AND STENT PLACEMENT Bilateral 03/06/2022   Procedure: CYSTOSCOPY WITH RETROGRADE PYELOGRAM, URETEROSCOPY AND STENT EXCHANGE;  Surgeon: Malen Gauze, MD;  [facility];  Service: Urology;  Laterality: Bilateral;   DILATION AND CURETTAGE OF UTERUS  12 yrs ago   DILATION AND CURETTAGE OF UTERUS  06/18/2011   Procedure: DILATATION AND CURETTAGE;  Surgeon: Lazaro Arms, MD;  [facility];  Service: Gynecology;  Laterality: N/A;  Suction Dilation and Curettage   ESOPHAGOGASTRODUODENOSCOPY N/A 10/12/2013   Dr.  Rourk:anastomotic ulcer likely cause of bleeding. likely ischemic    ESOPHAGOGASTRODUODENOSCOPY (EGD) WITH PROPOFOL N/A 01/19/2014   EAV:[id number]   EXTRACORPOREAL SHOCK WAVE LITHOTRIPSY Left 01/11/2018   Procedure: LEFT EXTRACORPOREAL SHOCK WAVE LITHOTRIPSY (ESWL);  Surgeon: Crista Elliot, MD;  [facility];  Service: Urology;  Laterality: Left;   EXTRACORPOREAL SHOCK WAVE LITHOTRIPSY Left 01/14/2018   Procedure: LEFT EXTRACORPOREAL SHOCK WAVE LITHOTRIPSY (ESWL);  Surgeon: Malen Gauze, MD;  [facility];  Service: Urology;  Laterality: Left;  75 MINS  W/ MAC   GASTRIC BYPASS  2013   [facility]   HOLMIUM LASER APPLICATION Bilateral 03/06/2022   Procedure: HOLMIUM LASER APPLICATION;  Surgeon: Malen Gauze, MD;  [facility];  Service: Urology;  Laterality: Bilateral;   HYSTEROSCOPY WITH D & C  06/18/2011   Procedure: DILATATION AND CURETTAGE /HYSTEROSCOPY;  Surgeon: Lazaro Arms, MD;  [facility];  Service: Gynecology;  Laterality: N/A;   LITHOTRIPSY     paniculectomy     SHOULDER ARTHROSCOPY WITH SUBACROMIAL DECOMPRESSION AND OPEN ROTATOR C Left 07/16/2021   Procedure: LEFT ARTHROSCOPIC SUBACROMIAL DECOMPRESSION, MINI OPEN ROTATOR CUFF TEAR REPAIR, BICEPS TENOTOMY;  Surgeon: Valeria Batman, MD;  [facility];  Service: Orthopedics;  Laterality: Left;   STONE EXTRACTION WITH BASKET Bilateral 03/06/2022   Procedure: STONE EXTRACTION WITH BASKET;  Surgeon: Malen Gauze, MD;  [facility];  Service: Urology;  Laterality: Bilateral;   TOTAL KNEE ARTHROPLASTY Left 04/17/2023   Procedure: LEFT TOTAL KNEE ARTHROPLASTY;  Surgeon: [name], MD;  [facility];  Service: Orthopedics;  Laterality: Left;   TRIGGER FINGER RELEASE     WISDOM TOOTH EXTRACTION     Past Medical History:  Diagnosis Date   Allergy    Anemia    Anxiety    Arthritis    knees, multiple joints   Asthma    Bell palsy    Blood transfusion without reported diagnosis    Breast mass    lt breast mass x's 3 years increased in size   Chronic kidney disease    Chronic pain syndrome    COPD (chronic obstructive pulmonary disease) (HCC)    Depression    Diabetes mellitus    Endometrial cancer (HCC)    Fibromyalgia    GERD (gastroesophageal  reflux disease)    H/O gastric bypass 05/09/2014   At [facility]   Headache    mirgraines   Heart murmur    Hyperlipidemia    Hypertension    Hypothyroidism    Multiple pulmonary nodules 11/30/2012   Followed in Pulmonary clinic/  Healthcare/ [place]  - See CT abd  10/29/12  New right lower lobe pulmonary nodularity, primarily ground-  glass in density. This could reflect an inflammatory process,  although follow-up is necessary to exclude atypical neoplasm. Full  chest CT should be considered to evaluate for other pulmonary  findings.      Multiple thyroid nodules    Nephropathy    Pneumonia    PONV (postoperative nausea and vomiting)    Pulmonary nodules    Thyroid disease    Ulcer    Uterine cancer (HCC) 12/26/2011   Stage 1, grade 1, S/P hysterectomy initially by robotic technique and then salpingo[facility]oophorectomy at [facility] receiving no postoperative treatment.   BP 91/68   Pulse 73   Ht 5\' 6"  (1.676 m)   Wt 222 lb (100.7 kg)   LMP 05/06/2011   BMI 35.83 kg/m  Opioid Risk Score:   Fall Risk Score:  `1  Depression screen [facility] 2/9     04/07/2023   10:25 AM 03/02/2023   10:20 AM 01/02/2023    1:45 PM 09/15/2022   11:06 AM 08/18/2022    2:11 PM 10/30/2016    4:49 PM 07/07/2016    2:11 PM  Depression screen PHQ 2/9  Decreased Interest 1 0 1 2 2  0 0  Down, Depressed, Hopeless 1 0 1 2 2  0 0  PHQ - 2 Score 2 0 2 4 4  0 0  Altered sleeping     3    Tired, decreased energy     2    Change in appetite     2    Feeling bad or failure about yourself      2    Trouble concentrating     2    Moving slowly or fidgety/restless     0    Suicidal thoughts     0    PHQ-9 Score     15    Difficult doing work/chores     Extremely dIfficult        Review of Systems  Musculoskeletal:  Positive for back pain, gait problem and neck pain.       B/L hand, knees,  right foot pain, left shoulder  All other systems reviewed and are negative.      Objective:   Physical Exam   Gen: no  distress, normal appearing HEENT: oral mucosa pink and moist, NCAT Chest: normal effort, normal rate of breathing Abd: soft, non-distended Ext: no edema Psych:  pleasant and appropriate  Skin: intact Neuro: Alert and awake, follows commands, cranial nerves II through XII grossly intact DTR normal and symmetric No focal motor or sensory deficits noted Musculoskeletal: No knee erythema, warmth or effusion Mild bilateral medial and lateral joint line of the knee tenderness, left knee greater than right knee TTP lumbar and cervical paraspinal muscles, shoulders, periscapular muscles Mild diffuse tenderness throughout her upper and lower extremities to palpation  Prior exams Slightly decreased L-spine and C-spine ROM in all directions She is able to fully extend her left knee.     Xray L knee result 03/10/22 "Three-view radiographs of the left knee were obtained today.  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  She has periarticular  osteophytes no acute fractures are noted "   Xray L knee result 12/02/21 " Radiographs of her right knee in 3 projections were reviewed today.  She  has tricompartmental arthritis with most changes in the medial compartment  with near bone-on-bone.  Also patellofemoral sclerosis and osteophyte  formation no acute fractures "        Assessment & Plan:   Knee OA bilateral -Foods for pain list provided -Could consider genicular nerve block if she does not have surgery -S/p TKA by Dr. Magnus Ivan  04/17/23 -ORT low  -Continue UDS and pill counts.  Continue PDMP monitoring.  Pain contract completed prior visit. -Discussed bringing pill bottle with any medications even if empty to all appointments  -Will decrease her Percocet 10 every 4 hours as needed to Percocet 7.5 every 4 hours as needed -Continue tizanidine as  needed -Continue gabapentin 1200 mg twice daily   Fibromyalgia  -Continue tizanidine as needed -Continue gabapentin 1200 mg twice daily -New consult for aquatic therapy placed-she  is limited by distance and cost because she lives in [place]- Her cars also need repair -Zynex Nexwave device ordered, Zynex Cryoheat blanket ordered prior visit -Consider reconsult aquatic therapy at a later time-limited by distance -We discussed trying to do self exercises at her local pool.  Provided note indicating she would benefit from pool therapy. -Discussed trying tai chi -She does have frequent stiffness in her joints , could consider rheumatology consult to rule out other rheumatological causes.   Right back and flank pain with Hx of Kidney stones -Improved, continue follow-up with urology as directed

## 2023-07-01 ENCOUNTER — Telehealth: Payer: Self-pay

## 2023-07-01 ENCOUNTER — Other Ambulatory Visit: Payer: Self-pay | Admitting: Physical Medicine & Rehabilitation

## 2023-07-01 MED ORDER — OXYCODONE-ACETAMINOPHEN 7.5-325 MG PO TABS: 1.0000 | ORAL_TABLET | ORAL | 0 refills | Status: DC | PRN

## 2023-07-01 NOTE — Telephone Encounter (Addendum)
 PA For Oxycodone 10-325 MG - RX approved 07/01/2023-12/29/2023.

## 2023-07-01 NOTE — Telephone Encounter (Signed)
 The Pharmacist at [facility] has  -cancelled the Oxycodone - 325 MG.  [facility] [place] [facility]   [facility] [address].

## 2023-07-06 ENCOUNTER — Encounter: Payer: Commercial Managed Care - HMO | Admitting: Physician Assistant

## 2023-07-07 ENCOUNTER — Encounter: Payer: Self-pay | Admitting: Physical Medicine & Rehabilitation

## 2023-07-15 ENCOUNTER — Ambulatory Visit (INDEPENDENT_AMBULATORY_CARE_PROVIDER_SITE_OTHER): Admitting: Physician Assistant

## 2023-07-15 ENCOUNTER — Encounter: Payer: Self-pay | Admitting: Physician Assistant

## 2023-07-15 DIAGNOSIS — M1711 Unilateral primary osteoarthritis, right knee: Secondary | ICD-10-CM

## 2023-07-15 DIAGNOSIS — Z96652 Presence of left artificial knee joint: Secondary | ICD-10-CM

## 2023-07-15 NOTE — Progress Notes (Signed)
 HPI: Molly Berry returns today status post left total knee arthroplasty 04/17/2023.  She overall is doing well until yesterday she feels she may have overdone it.  She has some pain medial aspect of the left knee.  No known injury.  Otherwise is doing well.  She denies any fevers chills or infections.  She does note that she has severe pain in her right knee 8 out of 10.  She has a lot of crunching in the knee.  She has known end-stage tricompartmental arthritis.  She is in chronic pain management and takes oxycodone 6 times daily for chronic pain.  Review of systems: Denies any fevers chills.  Physical exam: General Well-developed well-nourished pleasant female no acute distress mood affect appropriate. Respirations: Unlabored on room air. Vascular: Calves are supple and nontender bilaterally. Left knee: Full range of motion.  No instability valgus varus stressing.  Surgical incision positive keloid no signs of infection no wound dehiscence.  Right knee: Tenderness lateral joint line significant patellofemoral crepitus with passive range of motion no abnormal warmth erythema or effusion no gross instability.  Slight varus malalignment.  Impression: Status post left total knee arthroplasty Right knee end-stage tricompartmental arthritis  Plan: She will continue work on range of motion strengthening left knee.  Per her wishes she would like to proceed with a right total knee arthroplasty in May.  She like to have this done at [facility] on a Tuesday.  She denies any chest pain fevers chills or ongoing infections.  She is diabetic reports hemoglobin A1c at 6.0.  She understands risk benefits of surgery given her recent left total knee arthroplasty.  Risk benefits reviewed.  Questions were encouraged and answered at length.  Will work on scheduling her for right total knee in May.  In regards to the left knee we did not need to see her back until she is 6 months postop.

## 2023-07-30 ENCOUNTER — Other Ambulatory Visit: Payer: Self-pay | Admitting: Physical Medicine & Rehabilitation

## 2023-07-31 ENCOUNTER — Telehealth: Payer: Self-pay | Admitting: Physical Medicine & Rehabilitation

## 2023-07-31 ENCOUNTER — Encounter: Payer: Self-pay | Admitting: Physical Medicine & Rehabilitation

## 2023-07-31 MED ORDER — OXYCODONE-ACETAMINOPHEN 7.5-325 MG PO TABS
1.0000 | ORAL_TABLET | ORAL | 0 refills | Status: DC | PRN
Start: 2023-07-31 — End: 2023-08-11

## 2023-07-31 NOTE — Telephone Encounter (Signed)
 Left voicemail requesting [facility] [place]

## 2023-08-03 NOTE — Telephone Encounter (Signed)
 Verified with pharmacy she was able to fill the full rx on 08/01/23.

## 2023-08-04 ENCOUNTER — Encounter (HOSPITAL_COMMUNITY): Payer: Self-pay | Admitting: Hematology and Oncology

## 2023-08-11 ENCOUNTER — Encounter (HOSPITAL_COMMUNITY): Payer: Self-pay | Admitting: Hematology and Oncology

## 2023-08-11 ENCOUNTER — Encounter: Payer: 59 | Attending: Physical Medicine & Rehabilitation | Admitting: Physical Medicine & Rehabilitation

## 2023-08-11 ENCOUNTER — Encounter: Payer: Self-pay | Admitting: Physical Medicine & Rehabilitation

## 2023-08-11 VITALS — BP 117/79 | HR 68 | Ht 66.0 in | Wt 233.0 lb

## 2023-08-11 DIAGNOSIS — G894 Chronic pain syndrome: Secondary | ICD-10-CM | POA: Diagnosis not present

## 2023-08-11 DIAGNOSIS — Z79891 Long term (current) use of opiate analgesic: Secondary | ICD-10-CM | POA: Insufficient documentation

## 2023-08-11 DIAGNOSIS — M170 Bilateral primary osteoarthritis of knee: Secondary | ICD-10-CM | POA: Diagnosis present

## 2023-08-11 DIAGNOSIS — M797 Fibromyalgia: Secondary | ICD-10-CM | POA: Insufficient documentation

## 2023-08-11 MED ORDER — OXYCODONE-ACETAMINOPHEN 7.5-325 MG PO TABS: 1.0000 | ORAL_TABLET | ORAL | 0 refills | Status: DC | PRN

## 2023-08-11 NOTE — Progress Notes (Signed)
 Subjective:    Patient ID: Molly Berry, female    DOB: 10/09/1965, 58 y.o.   MRN: [id number]  HPI  HPI 08/18/22 Molly Berry is a 58 y.o. year old female  who  has a past medical history of Allergy, Anemia, Anxiety, Arthritis, Asthma, Bell palsy, Blood transfusion without reported diagnosis, Breast mass, Chronic kidney disease, Chronic pain syndrome, COPD (chronic obstructive pulmonary disease), Depression, Diabetes mellitus, Endometrial cancer, Fibromyalgia, GERD (gastroesophageal reflux disease), H/O gastric bypass (05/09/2014), Headache, Hyperlipidemia, Hypertension, Hypothyroidism, Multiple pulmonary nodules (11/30/2012), Multiple thyroid nodules, Nephropathy, Pneumonia, PONV (postoperative nausea and vomiting), Pulmonary nodules, Thyroid disease, Ulcer, and Uterine cancer (12/26/2011).   They are presenting to PM&R clinic as a new patient for pain management evaluation.  Molly Berry reports that most of her pain is related to her history of fibromyalgia and osteoarthritis particular of her knees.  She has widespread pain throughout her whole body from fibromyalgia.  She has had knee pain for several years and her knees are her worst area of pain. It was initially worse in her right knee, then her left knee became more severe.  She reports she had cortisone injections with slight benefit however she cannot get gel injections approved by insurance.  She would like to have knee surgery and is followed by orthopedics most recently seen by Legrand Como, PA.  She is working on weight loss in order to be able to qualify for surgery.  She was previously seen by [facility] pain management.  Her pain was previously managed with oxycodone 10 mg 3 times a day.  She reports she was with this practice for about 7 years.  She does report she was discharged after she took a tramadol by a family member gave her when she had had multiple procedures and surgeries for renal stones.  Since this time her PCP has been  prescribing her oxycodone 10 mg.     No red flags for back pain endorsed in Hx or ROS   Medications tried: Nsaids meloxicam helps, rare use due to concern for kidneys Tylenol - helps slightly  Opiates  oxycodone 10mg  helps, has used this for many years Gabapentin 1200mg  BID  Lyrica- reports reason she can't take it but doesn't remember what that was  Lamictal for mood  Buprenorphine - bad feeling  Tramadol didn't help  TCAs  - didn't help SNRIs  cymbalta, did not tolerate it  Other   Tizanidine/robaxin slightly helps    Other treatments: PT/OT  - helped in the past, she likes "integrative therapy" TENs unit - made her sore  Injections - knee cortisone   Interval history 09/15/2022 Patient also reports she has been doing well overall regarding her pain control.  Her pain has been severe in her right back and flank recently.  She reports follow-up with urology today in clinic, says she has had multiple issues with kidney stones.  She also had a UA ordered today.  She was started on Macrobid.  Prior to this her pain was much better controlled on Percocet 7.5 mg 4 times a day.  This was providing more consistent pain control than Percocet 10 mg 3 times a day.  Patient reports she got message from [facility] however needs to call them back.   Interval history 09/15/2022 Patient also reports she has been doing well overall regarding her pain control.  Her pain has been severe in her right back and flank recently.  She reports follow-up with urology today in clinic,  says she has had multiple issues with kidney stones.  She also had a UA ordered today.  She was started on Macrobid.  Prior to this her pain was much better controlled on Percocet 7.5 mg 4 times a day.  This was providing more consistent pain control than Percocet 10 mg 3 times a day.  Patient reports she got message from [facility] however needs to call them back.   Interval history 03/02/23 Molly Berry is here for follow-up regarding her  chronic pain.  She reports that pain continues to be poorly controlled.  Her worst pain is at her knees however she has pain all throughout her body.  Percocet 7.5 mg helps some days, other days her pain continues to be very severe.  No side effects with the medication.  She is planning on having knee replacement surgery with Dr. Magnus Ivan.  Interval history 04/07/23 Patient here for follow-up regarding her chronic pain.  Percocet continues to help keep her pain more tolerable.  She does have left TKA scheduled with Dr. Magnus Ivan on 04/17/2023.  She is a little concerned about postoperative pain.  She has been limited in her ability to start physical therapy because her truck is having mechanical issues.  Interval history 05/28/2023 Patient reports she is doing well after her knee replacement.  She was seen by Dr. Magnus Ivan yesterday, note indicates that Dr. Magnus Ivan would like her to follow-up with pain management for continued pain treatment.  She has been doing fairly well on Dilaudid 2 mg every 4 hours as needed.  Patient does report that she thinks this has been causing her GI upset.  She did not have the symptoms with oxycodone.  She has run out of Dilaudid yesterday and restarted with Percocet 7.5 2 tabs every 4 hours.  She reports she tried 1 tab of Percocet 7.5 but she cannot tolerate the pain.  She is not having any side effects with Percocet.  She reports that Dr. Magnus Ivan is overall happy with her progress.  She is considering having her other knee replaced as well.  Interval history 06/30/23 Patient reports her knee pain is doing better.  She is ready to decrease Percocet to 7.5 dose from the Percocet 10 dose.  She would also like to lower this for when she decides to have her other knee replaced.  She is not having any side effects with the medication.  She continues to have a lot of pain throughout her body.  She continues to be limited in her activity level.  We discussed trying tai chi, even  for about 5 minutes a day and potentially working up.  We also discussed trying aquatic therapy unfortunately she is not close to aquatic therapy center.  She might try joining the [facility] where they have a pool where she can do exercises on her own or potentially do water aerobics there.   She had problems with prior Auth for the medication but was able to get the Percocet.  Interval history 08/11/22 Patient is here for follow-up up about her chronic pain.  Left knee pain is gradually improving, she is happy with the results overall.  Right knee continues to be very painful.  She has been gradually increasing her activity level.  She is able to ride a horse for about an hour, this is something she very much enjoys doing.  No side effects with the Percocet 7.5 mg, using 5-6 times a day.  She is going to try decrease frequency of use  to 4 times a day.  She is considering surgery for her right knee late April or early May potentially.   Pain Inventory Average Pain 8 Pain Right Now 6 My pain is constant, burning, and aching  In the last 24 hours, has pain interfered with the following? General activity 6 Relation with others 5 Enjoyment of life 7 What TIME of day is your pain at its worst? morning  and evening Sleep (in general) Fair  Pain is worse with: bending and standing Pain improves with: rest, heat/ice, and medication Relief from Meds: 7  Family History  Problem Relation Age of Onset   Pneumonia Mother        Deceased   Arthritis Mother    Asthma Mother    Cancer Mother        pancreatic   COPD Mother    Depression Mother    Diabetes Mother    Kidney disease Mother    Liver cancer Father        Living   Arthritis Father    Cancer Father        liver, prostate   COPD Father    Depression Father    Stroke Father    Heart disease Maternal Grandmother    Mental illness Maternal Grandmother    Breast cancer Maternal Grandmother    Alcohol abuse Paternal Grandfather     Breast cancer Paternal Aunt    Anesthesia problems Neg Hx    Hypotension Neg Hx    Malignant hyperthermia Neg Hx    Pseudochol deficiency Neg Hx    Colon cancer Neg Hx    Social History   Socioeconomic History   Marital status: Married    Spouse name: Fayrene Fearing "Rusty"   Number of children: Not on file   Years of education: 16   Highest education level: Not on file  Occupational History   Occupation: Curator employed)    Employer: NOT EMPLOYED  Tobacco Use   Smoking status: Never   Smokeless tobacco: Never  Vaping Use   Vaping status: Never Used  Substance and Sexual Activity   Alcohol use: No    Alcohol/week: 0.0 standard drinks of alcohol   Drug use: No   Sexual activity: Yes    Birth control/protection: None, Surgical  Other Topics Concern   Not on file  Social History Narrative   Lives with husband and 2 sons 76 and 15. (adopted)    Wants to work but says she is not dependable.  She does do volunteer work.    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  Working on disability.     Education: bachelors degree.   Disabled because of fibromyalgia and depression since 2010   Social Drivers of Health   Financial Resource Strain: Low Risk  (05/26/2023)   Received from [facility]   Overall Financial Resource Strain (CARDIA)    Difficulty of Paying Living Expenses: Not hard at all  Food Insecurity: No Food Insecurity (05/26/2023)   Received from [facility]   Hunger Vital Sign    Worried About Running Out of Food in the Last Year: Never true    Ran Out of Food in the Last Year: Never true  Transportation Needs: No Transportation Needs (05/26/2023)   Received from [facility] - Transportation    Lack of Transportation (Medical): No    Lack of Transportation (Non-Medical): No  Physical Activity: Inactive (03/19/2021)   [facility]   Exercise Vital Sign    Days of Exercise per Week: 0 days    Minutes of Exercise per  Session: 0 min  Stress: Stress Concern Present (03/19/2021)   Received from [facility] - Occupational Stress Questionnaire    Feeling of Stress : Very much  Social Connections: Unknown (09/15/2021)   Received from [facility]   Social Network    Social Network: Not on file   Past Surgical History:  Procedure Laterality Date   ABDOMINAL HYSTERECTOMY     endometrial cancer   CHOLECYSTECTOMY     COLONOSCOPY WITH PROPOFOL N/A 01/19/2014   [id number]   CYSTOSCOPY W/ URETERAL STENT PLACEMENT Bilateral 02/07/2022   Procedure: CYSTOSCOPY WITH RETROGRADE PYELOGRAM/RIGHT URETERAL STENT PLACEMENT WITH POSSIBLE LEFT;  Surgeon: Jannifer Hick, MD;  Location: [facility];  Service: Urology;  Laterality: Bilateral;   CYSTOSCOPY WITH RETROGRADE PYELOGRAM, URETEROSCOPY AND STENT PLACEMENT Bilateral 03/06/2022   Procedure: CYSTOSCOPY WITH RETROGRADE PYELOGRAM, URETEROSCOPY AND STENT EXCHANGE;  Surgeon: Malen Gauze, MD;  Location: [facility];  Service: Urology;  Laterality: Bilateral;   DILATION AND CURETTAGE OF UTERUS  12 yrs ago   DILATION AND CURETTAGE OF UTERUS  06/18/2011   Procedure: DILATATION AND CURETTAGE;  Surgeon: Lazaro Arms, MD;  Location: [facility];  Service: Gynecology;  Laterality: N/A;  Suction Dilation and Curettage   ESOPHAGOGASTRODUODENOSCOPY N/A 10/12/2013   Dr. Rourk:anastomotic ulcer likely cause of bleeding. likely ischemic    ESOPHAGOGASTRODUODENOSCOPY (EGD) WITH PROPOFOL N/A 01/19/2014   [id number]   EXTRACORPOREAL SHOCK WAVE LITHOTRIPSY Left 01/11/2018   Procedure: LEFT EXTRACORPOREAL SHOCK WAVE LITHOTRIPSY (ESWL);  Surgeon: Crista Elliot, MD;  [facility]: [facility];  Service: Urology;  Laterality: Left;   EXTRACORPOREAL SHOCK WAVE LITHOTRIPSY Left 01/14/2018   Procedure: LEFT EXTRACORPOREAL SHOCK WAVE LITHOTRIPSY (ESWL);  Surgeon: Malen Gauze, MD;  Location: [facility];  Service: Urology;   Laterality: Left;  75 MINS  W/ MAC   GASTRIC BYPASS  2013   [facility]   HOLMIUM LASER APPLICATION Bilateral 03/06/2022   Procedure: HOLMIUM LASER APPLICATION;  Surgeon: Malen Gauze, MD;  Location: [facility];  Service: Urology;  Laterality: Bilateral;   HYSTEROSCOPY WITH D & C  06/18/2011   Procedure: DILATATION AND CURETTAGE /HYSTEROSCOPY;  Surgeon: Lazaro Arms, MD;  Location: [facility];  Service: Gynecology;  Laterality: N/A;   LITHOTRIPSY     paniculectomy     SHOULDER ARTHROSCOPY WITH SUBACROMIAL DECOMPRESSION AND OPEN ROTATOR C Left 07/16/2021   Procedure: LEFT ARTHROSCOPIC SUBACROMIAL DECOMPRESSION, MINI OPEN ROTATOR CUFF TEAR REPAIR, BICEPS TENOTOMY;  Surgeon: Valeria Batman, MD;  [facility];  Service: Orthopedics;  Laterality: Left;   STONE EXTRACTION WITH BASKET Bilateral 03/06/2022   Procedure: STONE EXTRACTION WITH BASKET;  Surgeon: Malen Gauze, MD;  [facility]: [facility];  Service: Urology;  Laterality: Bilateral;   TOTAL KNEE ARTHROPLASTY Left 04/17/2023   Procedure: LEFT TOTAL KNEE ARTHROPLASTY;  Surgeon: [name], MD;  [facility];  Service: Orthopedics;  Laterality: Left;   TRIGGER FINGER RELEASE     WISDOM TOOTH EXTRACTION     Past Surgical History:  Procedure Laterality Date   ABDOMINAL HYSTERECTOMY     endometrial cancer   CHOLECYSTECTOMY     COLONOSCOPY WITH PROPOFOL N/A 01/19/2014   [id number]   CYSTOSCOPY W/ URETERAL STENT PLACEMENT Bilateral 02/07/2022   Procedure: CYSTOSCOPY WITH RETROGRADE PYELOGRAM/RIGHT URETERAL STENT PLACEMENT WITH POSSIBLE  LEFT;  Surgeon: Jannifer Hick, MD;  [facility];  Service: Urology;  Laterality: Bilateral;   CYSTOSCOPY WITH RETROGRADE PYELOGRAM, URETEROSCOPY AND STENT PLACEMENT Bilateral 03/06/2022   Procedure: CYSTOSCOPY WITH RETROGRADE PYELOGRAM, URETEROSCOPY AND STENT EXCHANGE;  Surgeon: Malen Gauze, MD;  [facility];  Service: Urology;  Laterality: Bilateral;   DILATION AND  CURETTAGE OF UTERUS  12 yrs ago   DILATION AND CURETTAGE OF UTERUS  06/18/2011   Procedure: DILATATION AND CURETTAGE;  Surgeon: Lazaro Arms, MD;  [facility]: [facility];  Service: Gynecology;  Laterality: N/A;  Suction Dilation and Curettage   ESOPHAGOGASTRODUODENOSCOPY N/A 10/12/2013   Dr. Rourk:anastomotic ulcer likely cause of bleeding. likely ischemic    ESOPHAGOGASTRODUODENOSCOPY (EGD) WITH PROPOFOL N/A 01/19/2014   [id number]   EXTRACORPOREAL SHOCK WAVE LITHOTRIPSY Left 01/11/2018   Procedure: LEFT EXTRACORPOREAL SHOCK WAVE LITHOTRIPSY (ESWL);  Surgeon: Crista Elliot, MD;  [facility];  Service: Urology;  Laterality: Left;   EXTRACORPOREAL SHOCK WAVE LITHOTRIPSY Left 01/14/2018   Procedure: LEFT EXTRACORPOREAL SHOCK WAVE LITHOTRIPSY (ESWL);  Surgeon: Malen Gauze, MD;  [facility];  Service: Urology;  Laterality: Left;  75 MINS  W/ MAC   GASTRIC BYPASS  2013   [facility]   HOLMIUM LASER APPLICATION Bilateral 03/06/2022   Procedure: HOLMIUM LASER APPLICATION;  Surgeon: Malen Gauze, MD;  [facility]: [facility];  Service: Urology;  Laterality: Bilateral;   HYSTEROSCOPY WITH D & C  06/18/2011   Procedure: DILATATION AND CURETTAGE /HYSTEROSCOPY;  Surgeon: Lazaro Arms, MD;  Location: [facility];  Service: Gynecology;  Laterality: N/A;   LITHOTRIPSY     paniculectomy     SHOULDER ARTHROSCOPY WITH SUBACROMIAL DECOMPRESSION AND OPEN ROTATOR C Left 07/16/2021   Procedure: LEFT ARTHROSCOPIC SUBACROMIAL DECOMPRESSION, MINI OPEN ROTATOR CUFF TEAR REPAIR, BICEPS TENOTOMY;  Surgeon: Valeria Batman, MD;  [facility];  Service: Orthopedics;  Laterality: Left;   STONE EXTRACTION WITH BASKET Bilateral 03/06/2022   Procedure: STONE EXTRACTION WITH BASKET;  Surgeon: Malen Gauze, MD;  [facility];  Service: Urology;  Laterality: Bilateral;   TOTAL KNEE ARTHROPLASTY Left 04/17/2023   Procedure: LEFT TOTAL KNEE ARTHROPLASTY;  Surgeon: [name], MD;  [facility];  Service: Orthopedics;  Laterality: Left;   TRIGGER FINGER RELEASE     WISDOM TOOTH EXTRACTION     Past Medical History:  Diagnosis Date   Allergy    Anemia    Anxiety    Arthritis    knees, multiple joints   Asthma    Bell palsy    Blood transfusion without reported diagnosis    Breast mass    lt breast mass x's 3 years increased in size   Chronic kidney disease    Chronic pain syndrome    COPD (chronic obstructive pulmonary disease) (HCC)    Depression    Diabetes mellitus    Endometrial cancer (HCC)    Fibromyalgia    GERD (gastroesophageal reflux disease)    H/O gastric bypass 05/09/2014   At [facility]   Headache    mirgraines   Heart murmur    Hyperlipidemia    Hypertension    Hypothyroidism    Multiple pulmonary nodules 11/30/2012   Followed in Pulmonary clinic/ [facility]/ [place]  - See CT abd  10/29/12  New right lower lobe pulmonary nodularity, primarily ground-  glass in density. This could reflect an inflammatory process,  although follow-up is necessary to exclude atypical neoplasm. Full  chest CT should be considered to evaluate for other pulmonary  findings.      Multiple thyroid nodules    Nephropathy    Pneumonia    PONV (postoperative nausea and vomiting)    Pulmonary nodules    Thyroid disease    Ulcer    Uterine cancer (HCC) 12/26/2011   Stage 1, grade 1, S/P hysterectomy initially by robotic technique and then salpingo[facility]oophorectomy at [facility] receiving no postoperative treatment.   BP 117/79   Pulse 68   Ht 5\' 6"  (1.676 m)   Wt 233 lb (105.7 kg)   LMP 05/06/2011   SpO2 95%   BMI 37.61 kg/m   Opioid Risk Score:   Fall Risk Score:  `1  Depression screen [facility] 2/9     08/11/2023   11:18 AM 04/07/2023   10:25 AM 03/02/2023   10:20 AM 01/02/2023    1:45 PM 09/15/2022   11:06 AM 08/18/2022    2:11 PM 10/30/2016    4:49 PM  Depression screen PHQ 2/9  Decreased Interest 1 1 0 1 2 2  0  Down, Depressed, Hopeless 1 1 0 1 2 2  0  PHQ - 2  Score 2 2 0 2 4 4  0  Altered sleeping      3   Tired, decreased energy      2   Change in appetite      2   Feeling bad or failure about yourself       2   Trouble concentrating      2   Moving slowly or fidgety/restless      0   Suicidal thoughts      0   PHQ-9 Score      15   Difficult doing work/chores      Extremely dIfficult       Review of Systems  Musculoskeletal:  Positive for back pain, gait problem and neck pain.       B/L hand, knees,  right foot pain, left shoulder  All other systems reviewed and are negative.      Objective:   Physical Exam   Gen: no distress, normal appearing HEENT: oral mucosa pink and moist, NCAT Chest: normal effort, normal rate of breathing Abd: soft, non-distended Ext: no edema Psych:  pleasant and appropriate  Skin: intact Neuro: Alert and awake, follows commands, cranial nerves II through XII grossly intact DTR normal and symmetric No focal motor or sensory deficits noted Musculoskeletal: No knee erythema, warmth or effusion Right knee joint line TTP, No significant left knee joint line TTP today Mild TTP lumbar today   TTP cervical paraspinal muscles, shoulders, periscapular muscles-not checked today Mild diffuse tenderness throughout her upper and lower extremities to palpation-not checked today      Prior exams Slightly decreased L-spine and C-spine ROM in all directions She is able to fully extend her left knee.     Xray L knee result 03/10/22 "Three-view radiographs of the left knee were obtained today.  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  She has periarticular  osteophytes no acute fractures are noted "   Xray L knee result 12/02/21 " Radiographs of her right knee in 3 projections were reviewed today.  She  has tricompartmental arthritis with most changes in the  medial compartment  with near bone-on-bone.  Also patellofemoral sclerosis and osteophyte  formation  no acute fractures "        Assessment & Plan:   Knee OA bilateral -Foods for pain list provided -Could consider genicular nerve block if she does not have surgery -S/p TKA by Dr. Magnus Ivan  04/17/23 -ORT low  -Continue UDS and pill counts.  Continue PDMP monitoring.  Pain contract completed prior visit. -Discussed bringing pill bottle with any medications even if empty to all appointments  - Continue Percocet 10 every 4 hours as needed to Percocet 7.5 every 4 hours as needed, patient will try to wean this down to 4 times a day -Continue tizanidine as needed -Continue gabapentin 1200 mg twice daily -Patient reports she is scheduling her right knee replacement surgery soon   Fibromyalgia  -Continue tizanidine as needed -Continue gabapentin 1200 mg twice daily -New consult for aquatic therapy placed-she is limited by distance and cost because she lives in [place]- Her cars also need repair -Zynex Nexwave device ordered, Zynex Cryoheat blanket ordered prior visit -Consider reconsult aquatic therapy at a later time-limited by distance -We discussed trying to do self exercises at her local pool.  Provided note indicating she would benefit from pool therapy. - Recommend tai chi -She does have frequent stiffness in her joints , could consider rheumatology consult to rule out other rheumatological causes.   Right back and flank pain with Hx of Kidney stones -Improved, continue follow-up with urology as directed

## 2023-08-12 ENCOUNTER — Encounter (HOSPITAL_COMMUNITY): Payer: Self-pay | Admitting: Hematology and Oncology

## 2023-08-18 ENCOUNTER — Encounter (HOSPITAL_COMMUNITY): Payer: Self-pay | Admitting: Hematology and Oncology

## 2023-08-20 ENCOUNTER — Telehealth: Payer: Self-pay | Admitting: Radiology

## 2023-08-20 NOTE — Telephone Encounter (Signed)
 Patient called triage wanting to cancel RT TKA scheduled for 4/29.    States injured right shoulder and wanted to take care of this first, then reschedule surgery.

## 2023-08-24 ENCOUNTER — Encounter: Admitting: Sports Medicine

## 2023-08-24 NOTE — Telephone Encounter (Signed)
 Cancelled 04/29 surgery.

## 2023-09-01 ENCOUNTER — Encounter: Admitting: Physician Assistant

## 2023-09-01 ENCOUNTER — Telehealth: Payer: Self-pay

## 2023-09-01 ENCOUNTER — Telehealth: Payer: Self-pay | Admitting: Physical Medicine & Rehabilitation

## 2023-09-01 ENCOUNTER — Ambulatory Visit (HOSPITAL_COMMUNITY): Admit: 2023-09-01 | Admitting: Orthopaedic Surgery

## 2023-09-01 DIAGNOSIS — M1711 Unilateral primary osteoarthritis, right knee: Secondary | ICD-10-CM

## 2023-09-01 SURGERY — ARTHROPLASTY, KNEE, TOTAL
Anesthesia: Spinal | Site: Knee | Laterality: Right

## 2023-09-01 NOTE — Telephone Encounter (Signed)
(  Molly Berry) PA Case ID #: [id number] Submitted for Oxycodone Margueritte Shilling

## 2023-09-01 NOTE — Telephone Encounter (Signed)
 Pt needs hydrocodone  refill- pharmacy told her to call because they have not received the PA.

## 2023-09-03 NOTE — Telephone Encounter (Signed)
 Approved on April 29 by [employer] 2017 NCPDP Request Reference Number: [id number]. OXYCOD/APAP TAB 7.5-325 is approved through 03/02/2024. For further questions, call [employer] at [phone]. Effective Date: 09/01/2023 Authorization Expiration Date: 03/02/2024

## 2023-09-04 ENCOUNTER — Other Ambulatory Visit: Payer: Self-pay

## 2023-09-04 ENCOUNTER — Encounter: Payer: Self-pay | Admitting: Sports Medicine

## 2023-09-04 ENCOUNTER — Ambulatory Visit (INDEPENDENT_AMBULATORY_CARE_PROVIDER_SITE_OTHER): Admitting: Sports Medicine

## 2023-09-04 ENCOUNTER — Encounter: Payer: Self-pay | Admitting: Radiology

## 2023-09-04 ENCOUNTER — Other Ambulatory Visit (INDEPENDENT_AMBULATORY_CARE_PROVIDER_SITE_OTHER): Payer: Self-pay

## 2023-09-04 DIAGNOSIS — S46211A Strain of muscle, fascia and tendon of other parts of biceps, right arm, initial encounter: Secondary | ICD-10-CM

## 2023-09-04 DIAGNOSIS — G8929 Other chronic pain: Secondary | ICD-10-CM

## 2023-09-04 DIAGNOSIS — M25511 Pain in right shoulder: Secondary | ICD-10-CM

## 2023-09-04 DIAGNOSIS — M19011 Primary osteoarthritis, right shoulder: Secondary | ICD-10-CM | POA: Diagnosis not present

## 2023-09-04 NOTE — Progress Notes (Unsigned)
 Molly Berry - 58 y.o. female MRN [id number]  Date of birth: August 13, 1965  Office Visit Note: Visit Date: 09/04/2023 PCP: Gerrianne Krauss, PA-C Referred by: Gerrianne Krauss, PA-C  Subjective: Chief Complaint  Patient presents with  . Right Shoulder - Pain   HPI: Molly Berry is a pleasant 58 y.o. female who presents today for right shoulder injury.  Molly Berry had an injury about 2.5 weeks ago when she was reaching overhead and felt a pop in the shoulder that radiated down the arm.  Since then she has noticed a poking of the muscle in the bicep.  She has been icing consistently and her pain has somewhat improved but the muscle bulk is still present and she still does have some pain over the front part of the shoulder.  She does take Percocet 7.5-325 mg 5-6 times daily for her chronic pain in her knee pain, which has helped some as well.  Pertinent ROS were reviewed with the patient and found to be negative unless otherwise specified above in HPI.   Assessment & Plan: Visit Diagnoses:  1. Biceps tendon rupture, proximal, right, initial encounter   2. Chronic right shoulder pain   3. Primary osteoarthritis, right shoulder    Plan: ***  Follow-up: Return in about 3 weeks (around 09/25/2023) for R-shoulder f/u (30-min poss inj).   Meds & Orders: No orders of the defined types were placed in this encounter.   Orders Placed This Encounter  Procedures  . XR Shoulder Right  . US  Extrem Up Right Ltd     Procedures: US -Guided Biceps tendon sheath injection, Right shoulder  After discussion on risks/benefits/indications, informed verbal consent was obtained. A timeout was then performed. Patient was placed in supine position on the table in exam room. The patient's anterior shoulder was prepped with betadine  and alcohol swabs. Utilizing ultrasound guidance, the biceps tendon sheath was identified in a short-axis view. Using a 25G, 1.5" needle under ultrasound guidance, the tendon sheath was  injected from a lateral-medial direction with a 1:1:1 lidocaine :bupivicaine:depomedrol with visualization of injectate flow within the tendon sheath via an in-plane technique. Patient tolerated the procedure well without immediate complications.        Clinical History: No specialty comments available.  She reports that she has never smoked. She has never used smokeless tobacco.  Recent Labs    04/10/23 1118  HGBA1C 5.5    Objective:   Vital Signs: LMP 05/06/2011   Physical Exam  Gen: Well-appearing, in no acute distress; non-toxic CV: Well-perfused. Warm.  Resp: Breathing unlabored on room air; no wheezing. Psych: Fluid speech in conversation; appropriate affect; normal thought process  Ortho Exam - Right shoulder: + TTP within the bicipital groove.  There is mild soft tissue swelling in this location.  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  There is pain with speeds testing but also global motion about the shoulder in all directions.  Imaging: US  Extrem Up Right Ltd Result Date: 09/04/2023 Limited musculoskeletal ultrasound of the right upper extremity, anterior shoulder was performed today.  Ultrasound evaluated the bicipital groove with a large hypoechoic region where the proximal bicep tendon should be located.  There is evidence of proximal bicep tendon rupture with greater than 2 cm of retraction.  This was evaluated both in short and long axis.  The subscapularis was visualized in static and dynamic views with tendinopathic changes but no high-grade tearing. *Procedurally successful ultrasound-guided proximal bicep tendon sheath injection  XR Shoulder  Right Result Date: 09/04/2023 4 views of the right shoulder including AP, Grashey, scapular Y and axial view were ordered and reviewed by myself today.  X-rays demonstrate moderate to severe glenohumeral joint narrowing with inferior osteophytes and sclerosis of the medial femoral head and acetabular rim.  There is  sclerosis of the greater tuberosity, likely indicative of impingement syndrome.  There is advanced AC joint arthritic change as well.  No acute fracture noted.   Past Medical/Family/Surgical/Social History: Medications & Allergies reviewed per EMR, new medications updated. Patient Active Problem List   Diagnosis Date Noted  . Status post total left knee replacement 04/17/2023  . Elevated d-dimer 02/07/2022  . Obesity, Class III, BMI 40-49.9 (morbid obesity) 02/06/2022  . Hypoxia 02/06/2022  . Opioid dependence (HCC) 02/06/2022  . Pain in right wrist 11/07/2021  . Pain in left wrist 11/07/2021  . Complete tear of left rotator cuff 07/23/2021  . Gastroesophageal reflux disease without esophagitis 10/27/2018  . Type 2 diabetes mellitus with hyperlipidemia (HCC) 10/27/2018  . Carpal tunnel syndrome, bilateral 08/26/2017  . Moderate recurrent major depression (HCC) 12/10/2016  . Chronic pain syndrome 09/19/2016  . History of uterine cancer 07/16/2016  . Bee sting allergy 07/07/2016  . Attention deficit disorder (ADD) in adult 07/07/2016  . Chronic insomnia 07/07/2016  . Vitamin D  deficiency 07/07/2016  . Migraine syndrome 07/07/2016  . Fibromyalgia syndrome 07/07/2016  . HLD (hyperlipidemia) 07/07/2016  . Morbid obesity due to excess calories (HCC) 03/10/2015  . Controlled type 2 diabetes mellitus without complication, without long-term current use of insulin  (HCC) 03/09/2015  . Nontoxic multinodular goiter 03/09/2015  . Essential hypertension, benign 03/09/2015  . Hypertension associated with diabetes (HCC) 03/09/2015  . 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 IDA (iron deficiency anemia) 12/27/2013  . Calculus of kidney 09/17/2011   Past Medical History:  Diagnosis Date  . Allergy   . Anemia   . Anxiety   . Arthritis    knees, multiple joints  . Asthma   . Bell palsy   . Blood transfusion without reported diagnosis   . Breast mass    lt breast mass x's 3 years increased in size   . Chronic kidney disease   . Chronic pain syndrome   . COPD (chronic obstructive pulmonary disease) (HCC)   . Depression   . Diabetes mellitus   . Endometrial cancer (HCC)   . Fibromyalgia   . GERD (gastroesophageal reflux disease)   . H/O gastric bypass 05/09/2014   At [facility]  . Headache    mirgraines  . Heart murmur   . Hyperlipidemia   . Hypertension   . Hypothyroidism   . Multiple pulmonary nodules 11/30/2012   Followed in Pulmonary clinic/ [facility]/ [place]  - See CT abd  10/29/12  New right lower lobe pulmonary nodularity, primarily ground-  glass in density. This could reflect an inflammatory process,  although follow-up is necessary to exclude atypical neoplasm. Full  chest CT should be considered to evaluate for other pulmonary  findings.     . Multiple thyroid  nodules   . Nephropathy   . Pneumonia   . PONV (postoperative nausea and vomiting)   . Pulmonary nodules   . Thyroid  disease   . Ulcer   . Uterine cancer (HCC) 12/26/2011   Stage 1, grade 1, S/P hysterectomy initially by robotic technique and then salpingo[facility]oophorectomy at [facility] receiving no postoperative treatment.   Family History  Problem Relation Age of Onset  . Pneumonia Mother  Deceased  . Arthritis Mother   . Asthma Mother   . Cancer Mother        pancreatic  . COPD Mother   . Depression Mother   . Diabetes Mother   . Kidney disease Mother   . Liver cancer Father        Living  . Arthritis Father   . Cancer Father        liver, prostate  . COPD Father   . Depression Father   . Stroke Father   . Heart disease Maternal Grandmother   . Mental illness Maternal Grandmother   . Breast cancer Maternal Grandmother   . Alcohol abuse Paternal Grandfather   . Breast cancer Paternal Aunt   . Anesthesia problems Neg Hx   . Hypotension Neg Hx   . Malignant hyperthermia Neg Hx   . Pseudochol deficiency Neg Hx   . Colon cancer Neg Hx    Past Surgical History:  Procedure Laterality Date   . ABDOMINAL HYSTERECTOMY     endometrial cancer  . CHOLECYSTECTOMY    . COLONOSCOPY WITH PROPOFOL  N/A 01/19/2014   [id number]  . CYSTOSCOPY W/ URETERAL STENT PLACEMENT Bilateral 02/07/2022   Procedure: CYSTOSCOPY WITH RETROGRADE PYELOGRAM/RIGHT URETERAL STENT PLACEMENT WITH POSSIBLE LEFT;  Surgeon: Lahoma Pigg, MD;  Location: [facility];  Service: Urology;  Laterality: Bilateral;  . CYSTOSCOPY WITH RETROGRADE PYELOGRAM, URETEROSCOPY AND STENT PLACEMENT Bilateral 03/06/2022   Procedure: CYSTOSCOPY WITH RETROGRADE PYELOGRAM, URETEROSCOPY AND STENT EXCHANGE;  Surgeon: Marco Severs, MD;  Location: [facility];  Service: Urology;  Laterality: Bilateral;  . DILATION AND CURETTAGE OF UTERUS  12 yrs ago  . DILATION AND CURETTAGE OF UTERUS  06/18/2011   Procedure: DILATATION AND CURETTAGE;  Surgeon: Wendelyn Halter, MD;  Location: [facility];  Service: Gynecology;  Laterality: N/A;  Suction Dilation and Curettage  . ESOPHAGOGASTRODUODENOSCOPY N/A 10/12/2013   Dr. Rourk:anastomotic ulcer likely cause of bleeding. likely ischemic   . ESOPHAGOGASTRODUODENOSCOPY (EGD) WITH PROPOFOL  N/A 01/19/2014   [id number]  . EXTRACORPOREAL SHOCK WAVE LITHOTRIPSY Left 01/11/2018   Procedure: LEFT EXTRACORPOREAL SHOCK WAVE LITHOTRIPSY (ESWL);  Surgeon: Samson Croak, MD;  Location: [facility];  Service: Urology;  Laterality: Left;  . EXTRACORPOREAL SHOCK WAVE LITHOTRIPSY Left 01/14/2018   Procedure: LEFT EXTRACORPOREAL SHOCK WAVE LITHOTRIPSY (ESWL);  Surgeon: Marco Severs, MD;  Location: [facility];  Service: Urology;  Laterality: Left;  75 MINS  W/ MAC  . GASTRIC BYPASS  2013   [facility]  . HOLMIUM LASER APPLICATION Bilateral 03/06/2022   Procedure: HOLMIUM LASER APPLICATION;  Surgeon: Marco Severs, MD;  Location: [facility];  Service: Urology;  Laterality: Bilateral;  . HYSTEROSCOPY WITH D & C  06/18/2011   Procedure: DILATATION AND CURETTAGE /HYSTEROSCOPY;  Surgeon: Wendelyn Halter, MD;  Location: [facility];   Service: Gynecology;  Laterality: N/A;  . LITHOTRIPSY    . paniculectomy    . SHOULDER ARTHROSCOPY WITH SUBACROMIAL DECOMPRESSION AND OPEN ROTATOR C Left 07/16/2021   Procedure: LEFT ARTHROSCOPIC SUBACROMIAL DECOMPRESSION, MINI OPEN ROTATOR CUFF TEAR REPAIR, BICEPS TENOTOMY;  Surgeon: Shirlee Dotter, MD;  Location: [facility];  Service: Orthopedics;  Laterality: Left;  . STONE EXTRACTION WITH BASKET Bilateral 03/06/2022   Procedure: STONE EXTRACTION WITH BASKET;  Surgeon: Marco Severs, MD;  Location: [facility];  Service: Urology;  Laterality: Bilateral;  . TOTAL KNEE ARTHROPLASTY Left 04/17/2023   Procedure: LEFT TOTAL KNEE ARTHROPLASTY;  Surgeon: Arnie Lao, MD;  Location: [facility];  Service: Orthopedics;  Laterality: Left;  . TRIGGER FINGER RELEASE    . WISDOM TOOTH EXTRACTION     Social History   Occupational History  . Occupation: Curator employed)    Employer: NOT EMPLOYED  Tobacco Use  . Smoking status: Never  . Smokeless tobacco: Never  Vaping Use  . Vaping status: Never Used  Substance and Sexual Activity  . Alcohol use: No    Alcohol/week: 0.0 standard drinks of alcohol  . Drug use: No  . Sexual activity: Yes    Birth control/protection: None, Surgical

## 2023-09-04 NOTE — Telephone Encounter (Signed)
Patient has picked up the RX.

## 2023-09-04 NOTE — Progress Notes (Unsigned)
 Patient says that she hurt her shoulder about 2.5 weeks ago when reaching up overhead. She says that she felt a pop and had pain, and mentions a little ball in her arm. She has been icing and says that that ball went away. Her pain has improved in the 2.5 weeks since her initial injury. Patient says that most of her pain was in the front of the shoulder. She says that she already takes pain medication for her knee, but does think that has probably helped with her shoulder pain as well.

## 2023-09-14 ENCOUNTER — Encounter: Admitting: Orthopaedic Surgery

## 2023-09-24 ENCOUNTER — Other Ambulatory Visit: Payer: Self-pay | Admitting: Sports Medicine

## 2023-09-24 ENCOUNTER — Encounter: Payer: Self-pay | Admitting: Sports Medicine

## 2023-09-24 ENCOUNTER — Other Ambulatory Visit (INDEPENDENT_AMBULATORY_CARE_PROVIDER_SITE_OTHER): Payer: Self-pay

## 2023-09-24 ENCOUNTER — Ambulatory Visit (INDEPENDENT_AMBULATORY_CARE_PROVIDER_SITE_OTHER): Admitting: Sports Medicine

## 2023-09-24 DIAGNOSIS — S46211A Strain of muscle, fascia and tendon of other parts of biceps, right arm, initial encounter: Secondary | ICD-10-CM | POA: Diagnosis not present

## 2023-09-24 DIAGNOSIS — G8929 Other chronic pain: Secondary | ICD-10-CM | POA: Diagnosis not present

## 2023-09-24 DIAGNOSIS — M75102 Unspecified rotator cuff tear or rupture of left shoulder, not specified as traumatic: Secondary | ICD-10-CM

## 2023-09-24 DIAGNOSIS — M12812 Other specific arthropathies, not elsewhere classified, left shoulder: Secondary | ICD-10-CM

## 2023-09-24 DIAGNOSIS — M19011 Primary osteoarthritis, right shoulder: Secondary | ICD-10-CM | POA: Diagnosis not present

## 2023-09-24 DIAGNOSIS — M25512 Pain in left shoulder: Secondary | ICD-10-CM

## 2023-09-24 MED ORDER — LIDOCAINE HCL 1 % IJ SOLN
2.0000 mL | INTRAMUSCULAR | Status: AC | PRN
  Administered 2023-09-24: 2 mL

## 2023-09-24 MED ORDER — METHYLPREDNISOLONE ACETATE 40 MG/ML IJ SUSP
40.0000 mg | INTRAMUSCULAR | Status: AC | PRN
  Administered 2023-09-24: 40 mg via INTRA_ARTICULAR

## 2023-09-24 MED ORDER — BUPIVACAINE HCL 0.25 % IJ SOLN
2.0000 mL | INTRAMUSCULAR | Status: AC | PRN
  Administered 2023-09-24: 2 mL via INTRA_ARTICULAR

## 2023-09-24 NOTE — Progress Notes (Signed)
 Molly Berry - 58 y.o. female MRN [id number]  Date of birth: 09/21/1965  Office Visit Note: Visit Date: 09/24/2023 PCP: Gerrianne Krauss, PA-C Referred by: Gerrianne Krauss, PA-C  Subjective: Chief Complaint  Patient presents with   Right Shoulder - Follow-up   HPI: Molly Berry is a pleasant 58 y.o. female who presents today for follow-up of right shoulder pain, also with newer onset left shoulder pain.  Right shoulder -the right shoulder is doing excellent.  She feels much better after the injection.  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  Left shoulder -this has been bothering her chronically over the last 6 months or more.  She does have a notable history of a fully retracted full-thickness supraspinatus tear back in February 2023 based on MRI.  The following month she did undergo arthroscopic SCD and mini open rotator cuff tendon repair and biceps tenodesis by Dr. Aviva Lemmings.  She states after this that her pain was better but never fully went away.  She does report a clicking/popping sensation in the shoulder with motion.  Just here over the last few weeks however her pain and aching has been exacerbated and affecting her sleeping and ADLs.  She does take Percocet 7.5-325 mg 5-6 times daily for her chronic pain.  status post arthroscopic SCD and mini open rotator cuff tear repair and release of the biceps tendon.   Pertinent ROS were reviewed with the patient and found to be negative unless otherwise specified above in HPI.   Assessment & Plan: Visit Diagnoses:  1. Chronic left shoulder pain   2. Tear of left supraspinatus tendon   3. Primary osteoarthritis, right shoulder   4. Biceps tendon rupture, proximal, right, initial encounter   5. Rotator cuff arthropathy of left shoulder    Plan: Impression is chronic left shoulder pain with acute exacerbation which her exam points to her rotator cuff pathology.  She did undergo supraspinatus tendon repair and subacromial  decompression back in 2023.  Her physical exam points to the supraspinatus and likely underlying subscapularis as the region of pathology, I am somewhat concerned for rotator cuff repair failure versus irritation of the rotator cuff itself.  We did perform a subacromial joint injection to calm down her pain and inflammation.  I would like to get her started in formalized physical therapy for the left shoulder rotator cuff and stabilization, referral sent to [facility].  In terms of her right shoulder, she does have arthritic change as well as a prior proximal bicep tendon rupture which has improved greatly with time and previous injection therapy.  She will continue her chronic Percocet 7.5-325 mg 5-6 times daily as needed.  She will get started in formal PT and I will see her back in about 6 weeks for reevaluation.  Next steps may be considering MRI of the left shoulder if still having difficulty  Follow-up: Return in about 6 weeks (around 11/05/2023) for L-shoulder .   Meds & Orders: No orders of the defined types were placed in this encounter.   Orders Placed This Encounter  Procedures   Large Joint Inj   XR Shoulder Left   Ambulatory referral to Physical Therapy     Procedures: Large Joint Inj: L subacromial bursa on 09/24/2023 11:34 AM Indications: pain Details: 22 G 1.5 in needle, posterior approach Medications: 2 mL lidocaine  1 %; 2 mL bupivacaine  0.25 %; 40 mg methylPREDNISolone  acetate 40 MG/ML Outcome: tolerated well, no immediate complications  Subacromial  Joint Injection, Left Shoulder After discussion on risks/benefits/indications, informed verbal consent was obtained. A timeout was then performed. Patient was seated on table in exam room. The patient's shoulder was prepped with betadine  and alcohol swabs and utilizing posterior approach a 22G, 1.5" needle was directed anteriorly and laterally into the patient's subacromial space was injected with 2:2:1 mixture of  lidocaine :bupivicaine:depomedrol with appreciation of free-flowing of the injectate into the bursal space. Patient tolerated the procedure well without immediate complications.   Procedure, treatment alternatives, risks and benefits explained, specific risks discussed. Consent was given by the patient. Immediately prior to procedure a time out was called to verify the correct patient, procedure, equipment, support staff and site/side marked as required. Patient was prepped and draped in the usual sterile fashion.          Clinical History: No specialty comments available.  She reports that she has never smoked. She has never used smokeless tobacco.  Recent Labs    04/10/23 1118  HGBA1C 5.5    Objective:   Vital Signs: LMP 05/06/2011   Physical Exam  Gen: Well-appearing, in no acute distress; non-toxic CV: Well-perfused. Warm.  Resp: Breathing unlabored on room air; no wheezing. Psych: Fluid speech in conversation; appropriate affect; normal thought process  Ortho Exam - Left shoulder: There is a well-healed incision from prior rotator cuff repair.  There is no redness swelling or effusion.  Positive TTP at Codman's point.  There is about 8 degrees less of active forward flexion and 10 degrees of abduction secondary to pain.  Positive Hawkins impingement testing.  Positive drop arm and empty can testing.  There is also pain with resisted internal rotation with activation of the subscapularis.  Imaging: XR Shoulder Left Result Date: 09/24/2023 4 views of the left shoulder including AP, Grashey, scapular Y and axial view were ordered and reviewed by myself.  X-ray shows mild to moderate AC joint arthritis and mild glenohumeral joint arthritic change.  There is a moderate-sized spur off the inferior aspect of the humeral head.  There is some evidence of likely prior rotator cuff repair and subacromial decompression.  No acute fracture noted.  Humeral head is well located.   09/04/23: 4  views of the right shoulder including AP, Grashey, scapular Y and axial  view were ordered and reviewed by myself today.  X-rays demonstrate  moderate to severe glenohumeral joint narrowing with inferior osteophytes  and sclerosis of the medial femoral head and acetabular rim.  There is  sclerosis of the greater tuberosity, likely indicative of impingement  syndrome.  There is advanced AC joint arthritic change as well.  No acute  fracture noted.   Narrative & Impression  CLINICAL DATA:  Left shoulder pain for 12 weeks. Clinical concern for rotator cuff disorder.   EXAM: MRI OF THE LEFT SHOULDER WITHOUT CONTRAST   TECHNIQUE: Multiplanar, multisequence MR imaging of the shoulder was performed. No intravenous contrast was administered.   COMPARISON:  X-ray shoulder 05/21/2021.   FINDINGS: Rotator cuff: Large full-thickness, near complete, tear of the supraspinatus tendon with 22 mm of retraction and a few intact posterior fibers. Moderate tendinosis of the infraspinatus tendon. Teres minor tendon is intact. Moderate tendinosis of the subscapularis tendon with a partial-thickness tear.   Muscles: No muscle atrophy or edema. No intramuscular fluid collection or hematoma.   Biceps Long Head: Moderate tendinosis of the intra-articular portion of the long head of the biceps tendon.   Acromioclavicular Joint: Moderate arthropathy of the acromioclavicular joint. Small  amount of subacromial/subdeltoid bursal fluid.   Glenohumeral Joint: Small joint effusion with mild synovitis. Partial-thickness cartilage loss of the glenohumeral joint.   Labrum: Grossly intact, but evaluation is limited by lack of intraarticular fluid/contrast.   Bones: No fracture or dislocation. No aggressive osseous lesion.   Other: No fluid collection or hematoma.   IMPRESSION: 1. Large full-thickness, near complete, tear of the supraspinatus tendon with 22 mm of retraction and a few intact posterior  fibers. 2. Moderate tendinosis of the infraspinatus tendon. 3. Moderate tendinosis of the subscapularis tendon with a partial-thickness tear. 4. Moderate tendinosis of the intra-articular portion of the long head of the biceps tendon.     Electronically Signed   By: Onnie Bilis M.D.   On: 06/08/2021 16:18     Past Medical/Family/Surgical/Social History: Medications & Allergies reviewed per EMR, new medications updated. Patient Active Problem List   Diagnosis Date Noted   Status post total left knee replacement 04/17/2023   Elevated d-dimer 02/07/2022   Obesity, Class III, BMI 40-49.9 (morbid obesity) 02/06/2022   Hypoxia 02/06/2022   Opioid dependence (HCC) 02/06/2022   Pain in right wrist 11/07/2021   Pain in left wrist 11/07/2021   Complete tear of left rotator cuff 07/23/2021   Gastroesophageal reflux disease without esophagitis 10/27/2018   Type 2 diabetes mellitus with hyperlipidemia (HCC) 10/27/2018   Carpal tunnel syndrome, bilateral 08/26/2017   Moderate recurrent major depression (HCC) 12/10/2016   Chronic pain syndrome 09/19/2016   History of uterine cancer 07/16/2016   Bee sting allergy 07/07/2016   Attention deficit disorder (ADD) in adult 07/07/2016   Chronic insomnia 07/07/2016   Vitamin D  deficiency 07/07/2016   Migraine syndrome 07/07/2016   Fibromyalgia syndrome 07/07/2016   HLD (hyperlipidemia) 07/07/2016   Morbid obesity due to excess calories (HCC) 03/10/2015   Controlled type 2 diabetes mellitus without complication, without long-term current use of insulin  (HCC) 03/09/2015   Nontoxic multinodular goiter 03/09/2015   Essential hypertension, benign 03/09/2015   Hypertension associated with diabetes (HCC) 03/09/2015   H/O gastric bypass 05/09/2014   IDA (iron deficiency anemia) 12/27/2013   Calculus of kidney 09/17/2011   Past Medical History:  Diagnosis Date   Allergy    Anemia    Anxiety    Arthritis    knees, multiple joints   Asthma     Bell palsy    Blood transfusion without reported diagnosis    Breast mass    lt breast mass x's 3 years increased in size   Chronic kidney disease    Chronic pain syndrome    COPD (chronic obstructive pulmonary disease) (HCC)    Depression    Diabetes mellitus    Endometrial cancer (HCC)    Fibromyalgia    GERD (gastroesophageal reflux disease)    H/O gastric bypass 05/09/2014   At [facility]   Headache    mirgraines   Heart murmur    Hyperlipidemia    Hypertension    Hypothyroidism    Multiple pulmonary nodules 11/30/2012   Followed in Pulmonary clinic/ [facility]/ [place]  - See CT abd  10/29/12  New right lower lobe pulmonary nodularity, primarily ground-  glass in density. This could reflect an inflammatory process,  although follow-up is necessary to exclude atypical neoplasm. Full  chest CT should be considered to evaluate for other pulmonary  findings.      Multiple thyroid  nodules    Nephropathy    Pneumonia    PONV (postoperative nausea and vomiting)  Pulmonary nodules    Thyroid  disease    Ulcer    Uterine cancer (HCC) 12/26/2011   Stage 1, grade 1, S/P hysterectomy initially by robotic technique and then salpingo[facility]oophorectomy at [facility] receiving no postoperative treatment.   Family History  Problem Relation Age of Onset   Pneumonia Mother        Deceased   Arthritis Mother    Asthma Mother    Cancer Mother        pancreatic   COPD Mother    Depression Mother    Diabetes Mother    Kidney disease Mother    Liver cancer Father        Living   Arthritis Father    Cancer Father        liver, prostate   COPD Father    Depression Father    Stroke Father    Heart disease Maternal Grandmother    Mental illness Maternal Grandmother    Breast cancer Maternal Grandmother    Alcohol abuse Paternal Grandfather    Breast cancer Paternal Aunt    Anesthesia problems Neg Hx    Hypotension Neg Hx    Malignant hyperthermia Neg Hx    Pseudochol deficiency Neg Hx     Colon cancer Neg Hx    Past Surgical History:  Procedure Laterality Date   ABDOMINAL HYSTERECTOMY     endometrial cancer   CHOLECYSTECTOMY     COLONOSCOPY WITH PROPOFOL  N/A 01/19/2014   [id number]   CYSTOSCOPY W/ URETERAL STENT PLACEMENT Bilateral 02/07/2022   Procedure: CYSTOSCOPY WITH RETROGRADE PYELOGRAM/RIGHT URETERAL STENT PLACEMENT WITH POSSIBLE LEFT;  Surgeon: Lahoma Pigg, MD;  Location: [facility];  Service: Urology;  Laterality: Bilateral;   CYSTOSCOPY WITH RETROGRADE PYELOGRAM, URETEROSCOPY AND STENT PLACEMENT Bilateral 03/06/2022   Procedure: CYSTOSCOPY WITH RETROGRADE PYELOGRAM, URETEROSCOPY AND STENT EXCHANGE;  Surgeon: Marco Severs, MD;  Location: [facility];  Service: Urology;  Laterality: Bilateral;   DILATION AND CURETTAGE OF UTERUS  12 yrs ago   DILATION AND CURETTAGE OF UTERUS  06/18/2011   Procedure: DILATATION AND CURETTAGE;  Surgeon: Wendelyn Halter, MD;  Location: [facility];  Service: Gynecology;  Laterality: N/A;  Suction Dilation and Curettage   ESOPHAGOGASTRODUODENOSCOPY N/A 10/12/2013   Dr. Rourk:anastomotic ulcer likely cause of bleeding. likely ischemic    ESOPHAGOGASTRODUODENOSCOPY (EGD) WITH PROPOFOL  N/A 01/19/2014   [id number]   EXTRACORPOREAL SHOCK WAVE LITHOTRIPSY Left 01/11/2018   Procedure: LEFT EXTRACORPOREAL SHOCK WAVE LITHOTRIPSY (ESWL);  Surgeon: Samson Croak, MD;  Location: [facility];  Service: Urology;  Laterality: Left;   EXTRACORPOREAL SHOCK WAVE LITHOTRIPSY Left 01/14/2018   Procedure: LEFT EXTRACORPOREAL SHOCK WAVE LITHOTRIPSY (ESWL);  Surgeon: Marco Severs, MD;  Location: [facility];  Service: Urology;  Laterality: Left;  75 MINS  W/ MAC   GASTRIC BYPASS  2013   [facility]   HOLMIUM LASER APPLICATION Bilateral 03/06/2022   Procedure: HOLMIUM LASER APPLICATION;  Surgeon: Marco Severs, MD;  Location: [facility];  Service: Urology;  Laterality: Bilateral;   HYSTEROSCOPY WITH D & C  06/18/2011   Procedure: DILATATION AND CURETTAGE  /HYSTEROSCOPY;  Surgeon: Wendelyn Halter, MD;  Location: [facility];  Service: Gynecology;  Laterality: N/A;   LITHOTRIPSY     paniculectomy     SHOULDER ARTHROSCOPY WITH SUBACROMIAL DECOMPRESSION AND OPEN ROTATOR C Left 07/16/2021   Procedure: LEFT ARTHROSCOPIC SUBACROMIAL DECOMPRESSION, MINI OPEN ROTATOR CUFF TEAR REPAIR, BICEPS TENOTOMY;  Surgeon: Shirlee Dotter, MD;  Location: [facility];  Service:  Orthopedics;  Laterality: Left;   STONE EXTRACTION WITH BASKET Bilateral 03/06/2022   Procedure: STONE EXTRACTION WITH BASKET;  Surgeon: Marco Severs, MD;  Location: [facility];  Service: Urology;  Laterality: Bilateral;   TOTAL KNEE ARTHROPLASTY Left 04/17/2023   Procedure: LEFT TOTAL KNEE ARTHROPLASTY;  Surgeon: Arnie Lao, MD;  Location: [facility];  Service: Orthopedics;  Laterality: Left;   TRIGGER FINGER RELEASE     WISDOM TOOTH EXTRACTION     Social History   Occupational History   Occupation: Curator employed)    Employer: NOT EMPLOYED  Tobacco Use   Smoking status: Never   Smokeless tobacco: Never  Vaping Use   Vaping status: Never Used  Substance and Sexual Activity   Alcohol use: No    Alcohol/week: 0.0 standard drinks of alcohol   Drug use: No   Sexual activity: Yes    Birth control/protection: None, Surgical

## 2023-09-24 NOTE — Progress Notes (Signed)
 Patient says that her right shoulder is feeling much better since the injection. She does have some occasional discomfort if she sleeps in specific positions, but she can readjust and alleviate that discomfort.   Patient says that her left shoulder has been painful. She says that it was painful prior to the right shoulder injury. She says that she had her rotator cuff repaired with Dr. Aviva Lemmings, but she does have audible popping. 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 She does get aching after about 1 hour of holding. She uses ice which gives her some relief.

## 2023-09-29 ENCOUNTER — Other Ambulatory Visit: Payer: Self-pay

## 2023-09-29 ENCOUNTER — Encounter: Payer: Self-pay | Admitting: Orthopaedic Surgery

## 2023-09-29 MED ORDER — OXYCODONE-ACETAMINOPHEN 7.5-325 MG PO TABS: 1.0000 | ORAL_TABLET | ORAL | 0 refills | Status: DC | PRN

## 2023-10-10 IMAGING — MR MR SHOULDER*L* W/O CM
5 series · 40 of 40 positions shown · non-contrast
Comparison: X-ray shoulder 05/21/2021.

CLINICAL DATA: Left shoulder pain for 12 weeks. Clinical concern
for rotator cuff disorder.

EXAM:
MRI OF THE LEFT SHOULDER WITHOUT CONTRAST
TECHNIQUE: Multiplanar, multisequence MR imaging of the shoulder was performed.
No intravenous contrast was administered.

[Series 2: T2 fat-sat · axial · left · 4.0mm · 0.49mm/px · z∈[-37,+49]mm · 8 of 20 slices shown (1 of 3)]
[im 1/20]
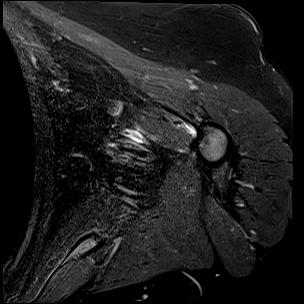
[im 3/20]
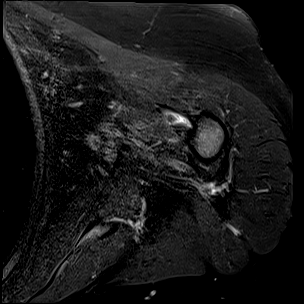
[im 6/20]
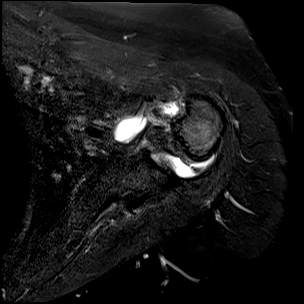
[im 9/20]
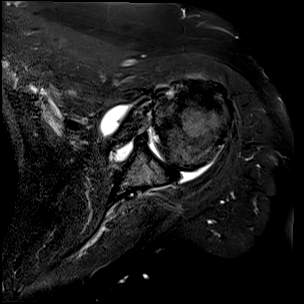
[im 11/20]
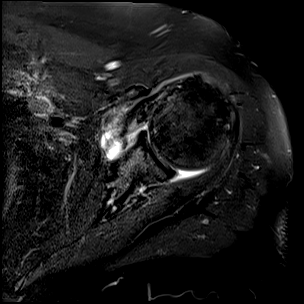
[im 14/20]
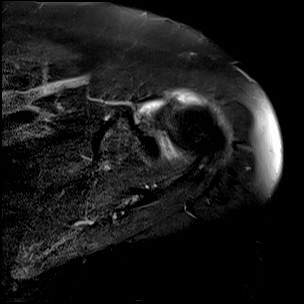
[im 17/20]
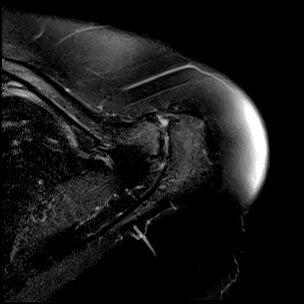
[im 20/20]
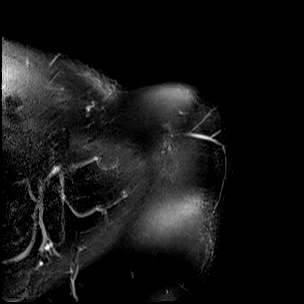

[Series 3: T1 · oblique · left · 4.0mm · 0.41mm/px · 8 of 19 slices shown]
[im 1/19]
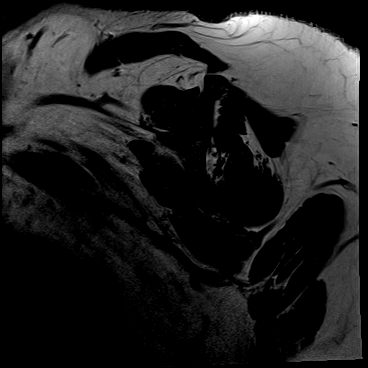
[im 3/19]
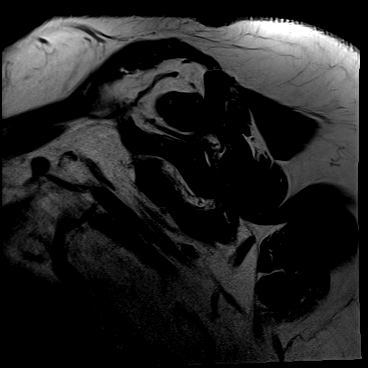
[im 6/19]
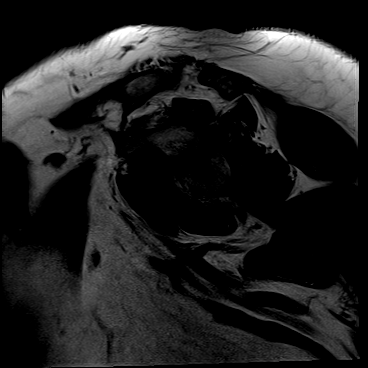
[im 8/19]
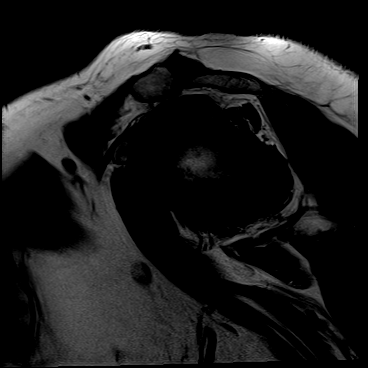
[im 11/19]
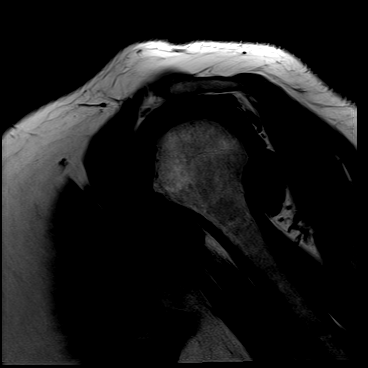
[im 13/19]
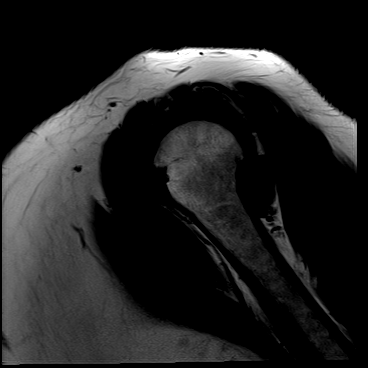
[im 16/19]
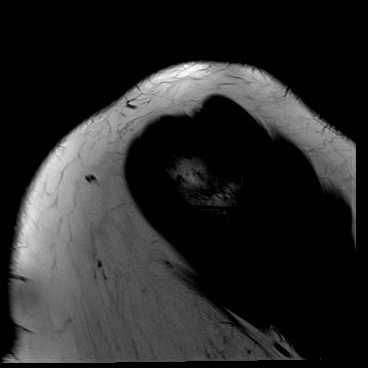
[im 19/19]
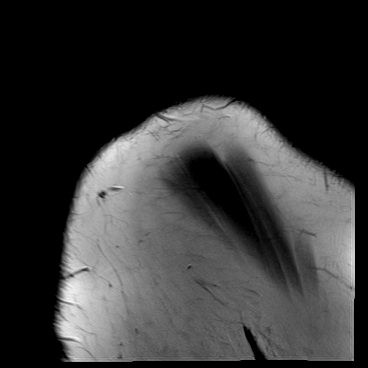

[Series 4: T2 fat-sat · oblique · left · 4.0mm · 0.47mm/px · 8 of 19 slices shown (2 of 3)]
[im 1/19]
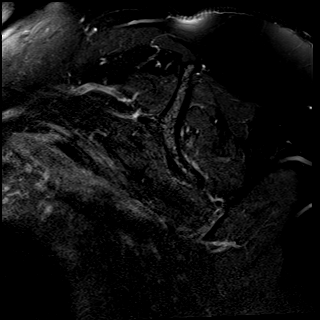
[im 3/19]
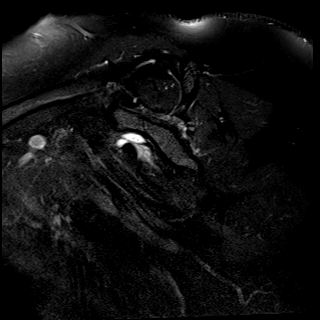
[im 6/19]
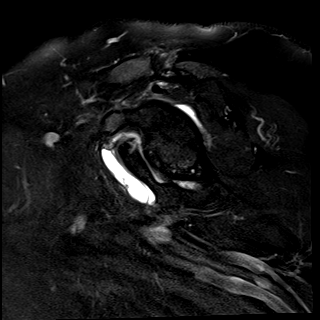
[im 8/19]
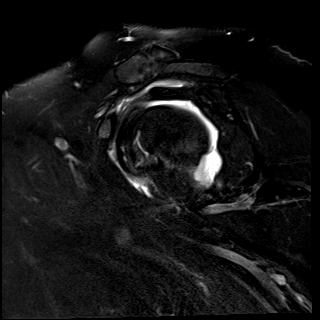
[im 11/19]
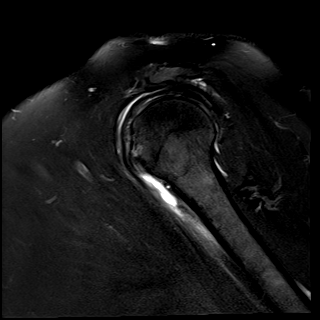
[im 13/19]
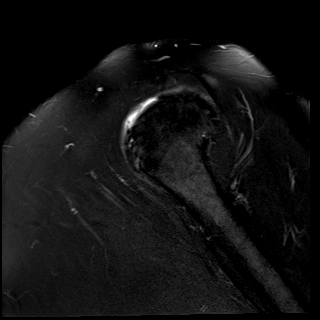
[im 16/19]
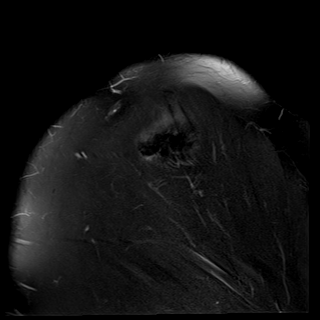
[im 19/19]
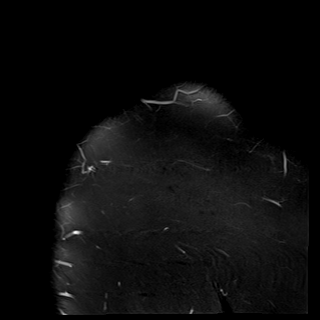

[Series 5: T2 fat-sat · oblique · left · 4.0mm · 0.47mm/px · 8 of 19 slices shown (3 of 3)]
[im 1/19]
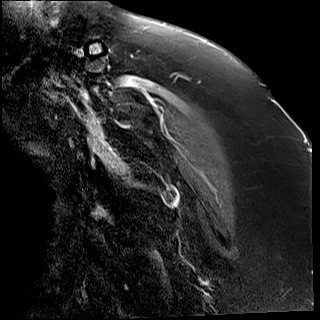
[im 3/19]
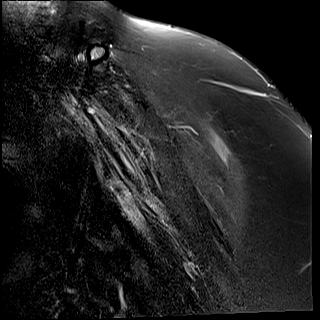
[im 6/19]
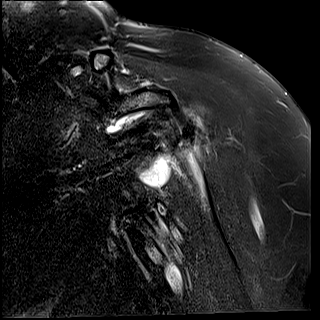
[im 8/19]
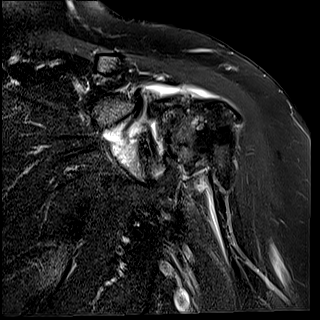
[im 11/19]
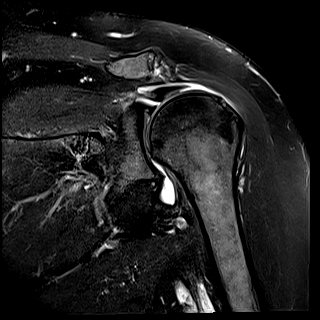
[im 13/19]
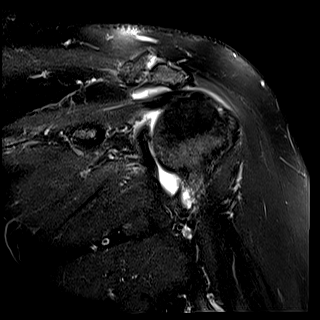
[im 16/19]
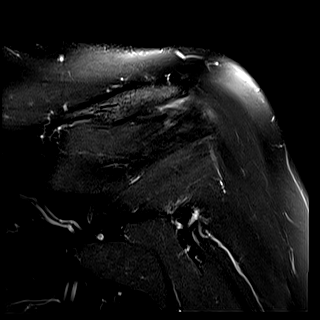
[im 19/19]
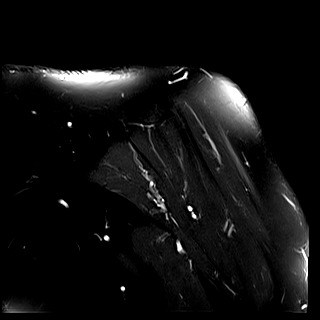

[Series 6: PD · oblique · left · 4.0mm · 0.43mm/px · 8 of 19 slices shown]
[im 1/19]
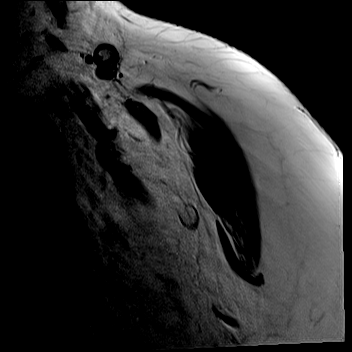
[im 3/19]
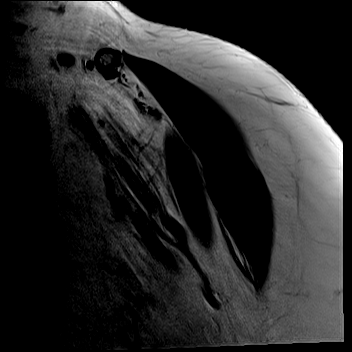
[im 6/19]
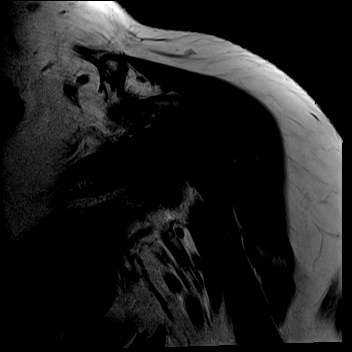
[im 8/19]
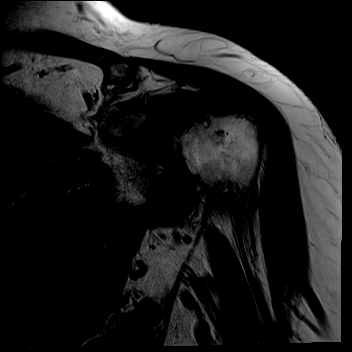
[im 11/19]
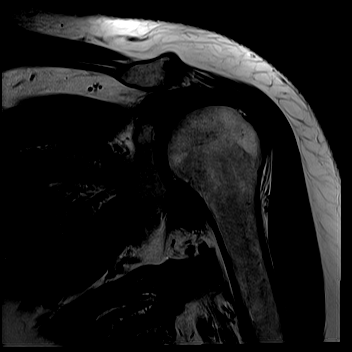
[im 13/19]
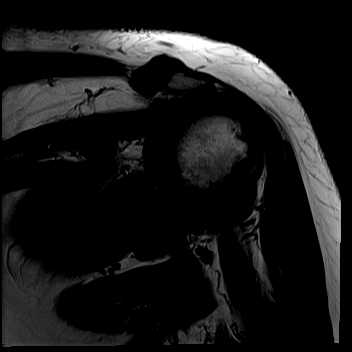
[im 16/19]
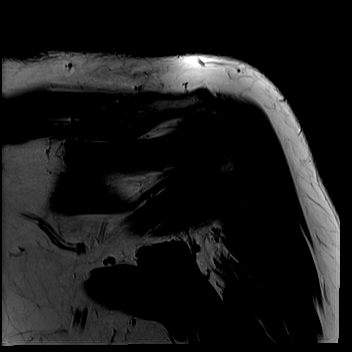
[im 19/19]
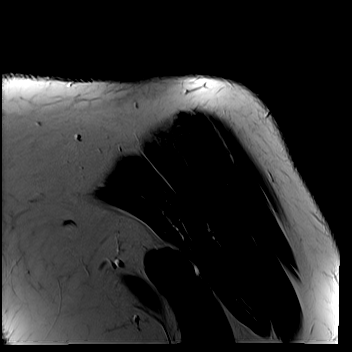

[40 of 40 positions shown; findings below may reference images not displayed]

FINDINGS: Rotator cuff: Large full-thickness, near complete, tear of the
supraspinatus tendon with 22 mm of retraction and a few intact
posterior fibers. Moderate tendinosis of the infraspinatus tendon.
Teres minor tendon is intact. Moderate tendinosis of the
subscapularis tendon with a partial-thickness tear.

Muscles: No muscle atrophy or edema. No intramuscular fluid
collection or hematoma.

Biceps Long Head: Moderate tendinosis of the intra-articular portion
of the long head of the biceps tendon.

Acromioclavicular Joint: Moderate arthropathy of the
acromioclavicular joint. Small amount of subacromial/subdeltoid
bursal fluid.

Glenohumeral Joint: Small joint effusion with mild synovitis.
Partial-thickness cartilage loss of the glenohumeral joint.

Labrum: Grossly intact, but evaluation is limited by lack of
intraarticular fluid/contrast.

Bones: No fracture or dislocation. No aggressive osseous lesion.

Other: No fluid collection or hematoma.
IMPRESSION: 1. Large full-thickness, near complete, tear of the supraspinatus
tendon with 22 mm of retraction and a few intact posterior fibers.
2. Moderate tendinosis of the infraspinatus tendon.
3. Moderate tendinosis of the subscapularis tendon with a
partial-thickness tear.
4. Moderate tendinosis of the intra-articular portion of the long
head of the biceps tendon.

## 2023-10-15 ENCOUNTER — Encounter: Payer: Self-pay | Admitting: Physical Medicine & Rehabilitation

## 2023-10-15 ENCOUNTER — Encounter: Attending: Physical Medicine & Rehabilitation | Admitting: Physical Medicine & Rehabilitation

## 2023-10-15 VITALS — BP 114/80 | HR 63 | Ht 66.0 in | Wt 231.2 lb

## 2023-10-15 DIAGNOSIS — Z79891 Long term (current) use of opiate analgesic: Secondary | ICD-10-CM | POA: Insufficient documentation

## 2023-10-15 DIAGNOSIS — M25512 Pain in left shoulder: Secondary | ICD-10-CM | POA: Insufficient documentation

## 2023-10-15 DIAGNOSIS — M170 Bilateral primary osteoarthritis of knee: Secondary | ICD-10-CM | POA: Insufficient documentation

## 2023-10-15 DIAGNOSIS — M797 Fibromyalgia: Secondary | ICD-10-CM | POA: Insufficient documentation

## 2023-10-15 DIAGNOSIS — M25511 Pain in right shoulder: Secondary | ICD-10-CM | POA: Insufficient documentation

## 2023-10-15 MED ORDER — OXYCODONE-ACETAMINOPHEN 7.5-325 MG PO TABS: 1.0000 | ORAL_TABLET | ORAL | 0 refills | Status: DC | PRN

## 2023-10-15 NOTE — Progress Notes (Signed)
 Subjective:    Patient ID: Molly Berry, female    DOB: 1965-11-24, 58 y.o.   MRN: [id number]  HPI  HPI 08/18/22 Molly Berry is a 58 y.o. year old female  who  has a past medical history of Allergy, Anemia, Anxiety, Arthritis, Asthma, Bell palsy, Blood transfusion without reported diagnosis, Breast mass, Chronic kidney disease, Chronic pain syndrome, COPD (chronic obstructive pulmonary disease), Depression, Diabetes mellitus, Endometrial cancer, Fibromyalgia, GERD (gastroesophageal reflux disease), H/O gastric bypass (05/09/2014), Headache, Hyperlipidemia, Hypertension, Hypothyroidism, Multiple pulmonary nodules (11/30/2012), Multiple thyroid  nodules, Nephropathy, Pneumonia, PONV (postoperative nausea and vomiting), Pulmonary nodules, Thyroid  disease, Ulcer, and Uterine cancer (12/26/2011).   They are presenting to PM&R clinic as a new patient for pain management evaluation.  Molly Berry reports that most of her pain is related to her history of fibromyalgia and osteoarthritis particular of her knees.  She has widespread pain throughout her whole body from fibromyalgia.  She has had knee pain for several years and her knees are her worst area of pain. It was initially worse in her right knee, then her left knee became more severe.  She reports she had cortisone injections with slight benefit however she cannot get gel injections approved by insurance.  She would like to have knee surgery and is followed by orthopedics most recently seen by Molly Ruff, PA.  She is working on weight loss in order to be able to qualify for surgery.  She was previously seen by [facility] pain management.  Her pain was previously managed with oxycodone  10 mg 3 times a day.  She reports she was with this practice for about 7 years.  She does report she was discharged after she took a tramadol by a family member gave her when she had had multiple procedures and surgeries for renal stones.  Since this time her PCP has been  prescribing her oxycodone  10 mg.     No red flags for back pain endorsed in Hx or ROS   Medications tried: Nsaids meloxicam  helps, rare use due to concern for kidneys Tylenol  - helps slightly  Opiates  oxycodone  10mg  helps, has used this for many years Gabapentin  1200mg  BID  Lyrica- reports reason she can't take it but doesn't remember what that was  Lamictal  for mood  Buprenorphine - bad feeling  Tramadol didn't help  TCAs  - didn't help SNRIs  cymbalta, did not tolerate it  Other   Tizanidine /robaxin  slightly helps    Other treatments: PT/OT  - helped in the past, she likes integrative therapy TENs unit - made her sore  Injections - knee cortisone   Interval history 09/15/2022 Patient also reports she has been doing well overall regarding her pain control.  Her pain has been severe in her right back and flank recently.  She reports follow-up with urology today in clinic, says she has had multiple issues with kidney stones.  She also had a UA ordered today.  She was started on Macrobid .  Prior to this her pain was much better controlled on Percocet 7.5 mg 4 times a day.  This was providing more consistent pain control than Percocet 10 mg 3 times a day.  Patient reports she got message from [facility]  however needs to call them back.   Interval history 09/15/2022 Patient also reports she has been doing well overall regarding her pain control.  Her pain has been severe in her right back and flank recently.  She reports follow-up with urology today in clinic,  says she has had multiple issues with kidney stones.  She also had a UA ordered today.  She was started on Macrobid .  Prior to this her pain was much better controlled on Percocet 7.5 mg 4 times a day.  This was providing more consistent pain control than Percocet 10 mg 3 times a day.  Patient reports she got message from [facility]  however needs to call them back.   Interval history 03/02/23 Molly Berry is here for follow-up regarding her  chronic pain.  She reports that pain continues to be poorly controlled.  Her worst pain is at her knees however she has pain all throughout her body.  Percocet 7.5 mg helps some days, other days her pain continues to be very severe.  No side effects with the medication.  She is planning on having knee replacement surgery with Molly Berry.  Interval history 04/07/23 Patient here for follow-up regarding her chronic pain.  Percocet continues to help keep her pain more tolerable.  She does have left TKA scheduled with Molly Berry on 04/17/2023.  She is a little concerned about postoperative pain.  She has been limited in her ability to start physical therapy because her truck is having mechanical issues.  Interval history 05/28/2023 Patient reports she is doing well after her knee replacement.  She was seen by Molly Berry yesterday, note indicates that Molly Berry would like her to follow-up with pain management for continued pain treatment.  She has been doing fairly well on Dilaudid  2 mg every 4 hours as needed.  Patient does report that she thinks this has been causing her GI upset.  She did not have the symptoms with oxycodone .  She has run out of Dilaudid  yesterday and restarted with Percocet 7.5 2 tabs every 4 hours.  She reports she tried 1 tab of Percocet 7.5 but she cannot tolerate the pain.  She is not having any side effects with Percocet.  She reports that Molly Berry is overall happy with her progress.  She is considering having her other knee replaced as well.  Interval history 06/30/23 Patient reports her knee pain is doing better.  She is ready to decrease Percocet to 7.5 dose from the Percocet 10 dose.  She would also like to lower this for when she decides to have her other knee replaced.  She is not having any side effects with the medication.  She continues to have a lot of pain throughout her body.  She continues to be limited in her activity level.  We discussed trying tai chi, even  for about 5 minutes a day and potentially working up.  We also discussed trying aquatic therapy unfortunately she is not close to aquatic therapy center.  She might try joining the [facility] where they have a pool where she can do exercises on her own or potentially do water  aerobics there.   She had problems with prior Auth for the medication but was able to get the Percocet.  Interval history 08/11/22 Patient is here for follow-up up about her chronic pain.  Left knee pain is gradually improving, she is happy with the results overall.  Right knee continues to be very painful.  She has been gradually increasing her activity level.  She is able to ride a horse for about an hour, this is something she very much enjoys doing.  No side effects with the Percocet 7.5 mg, using 5-6 times a day.  She is going to try decrease frequency of use  to 4 times a day.  She is considering surgery for her right knee late April or early May potentially.   Interval History 10/15/23 She has been having pain in her shoulders. She is followed by orthopaedics, Dr. Vaughn Georges.  R shoulder pain improved with cortisone injection with ortho, L shoulder continues to be painful. She is followed with Dr. Vaughn Georges, possibly rotator cuff disorder. PT has been ordered. She is noted to have recent R bicep tendon rupture. He has ordered PT and considering MRI L shoulder.  Percocet doing well at current dose. She is taking about 5 tabs percocet per day and this is helping control her overall pain. No side effects with the medication.   R knee pain is doing OK. L knee has been a little more painful.   Pain Inventory Average Pain 8 Pain Right Now 6 My pain is constant, burning, and aching  In the last 24 hours, has pain interfered with the following? General activity 6 Relation with others 5 Enjoyment of life 7 What TIME of day is your pain at its worst? morning  and evening Sleep (in general) Fair  Pain is worse with: bending and  standing Pain improves with: rest, heat/ice, and medication Relief from Meds: 7  Family History  Problem Relation Age of Onset   Pneumonia Mother        Deceased   Arthritis Mother    Asthma Mother    Cancer Mother        pancreatic   COPD Mother    Depression Mother    Diabetes Mother    Kidney disease Mother    Liver cancer Father        Living   Arthritis Father    Cancer Father        liver, prostate   COPD Father    Depression Father    Stroke Father    Heart disease Maternal Grandmother    Mental illness Maternal Grandmother    Breast cancer Maternal Grandmother    Alcohol abuse Paternal Grandfather    Breast cancer Paternal Aunt    Anesthesia problems Neg Hx    Hypotension Neg Hx    Malignant hyperthermia Neg Hx    Pseudochol deficiency Neg Hx    Colon cancer Neg Hx    Social History   Socioeconomic History   Marital status: Married    Spouse name: Marlana Silvan   Number of children: Not on file   Years of education: 16   Highest education level: Not on file  Occupational History   Occupation: Curator employed)    Employer: NOT EMPLOYED  Tobacco Use   Smoking status: Never   Smokeless tobacco: Never  Vaping Use   Vaping status: Never Used  Substance and Sexual Activity   Alcohol use: No    Alcohol/week: 0.0 standard drinks of alcohol   Drug use: No   Sexual activity: Yes    Birth control/protection: None, Surgical  Other Topics Concern   Not on file  Social History Narrative   Lives with husband and 2 sons 54 and 15. (adopted)    Wants to work but says she is not dependable.  She does do volunteer work.    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  Working on disability.     Education: bachelors degree.   Disabled because of fibromyalgia and depression since 2010   Social Drivers of Health   Financial Resource Strain: Low Risk  (05/26/2023)  Received from [facility]   Overall Financial Resource Strain (CARDIA)    Difficulty of  Paying Living Expenses: Not hard at all  Food Insecurity: No Food Insecurity (05/26/2023)   Received from [facility]   Hunger Vital Sign    Worried About Running Out of Food in the Last Year: Never true    Ran Out of Food in the Last Year: Never true  Transportation Needs: No Transportation Needs (05/26/2023)   Received from [facility] - Transportation    Lack of Transportation (Medical): No    Lack of Transportation (Non-Medical): No  Physical Activity: Inactive (03/19/2021)   Received from [facility]   Exercise Vital Sign    Days of Exercise per Week: 0 days    Minutes of Exercise per Session: 0 min  Stress: Stress Concern Present (03/19/2021)   Received from [facility] - Occupational Stress Questionnaire    Feeling of Stress : Very much  Social Connections: Unknown (09/15/2021)   Received from [facility]   Social Network    Social Network: Not on file   Past Surgical History:  Procedure Laterality Date   ABDOMINAL HYSTERECTOMY     endometrial cancer   CHOLECYSTECTOMY     COLONOSCOPY WITH PROPOFOL  N/A 01/19/2014   ZOX:[id number]   CYSTOSCOPY W/ URETERAL STENT PLACEMENT Bilateral 02/07/2022   Procedure: CYSTOSCOPY WITH RETROGRADE PYELOGRAM/RIGHT URETERAL STENT PLACEMENT WITH POSSIBLE LEFT;  Surgeon: Lahoma Pigg, MD;  Location: [facility];  Service: Urology;  Laterality: Bilateral;   CYSTOSCOPY WITH RETROGRADE PYELOGRAM, URETEROSCOPY AND STENT PLACEMENT Bilateral 03/06/2022   Procedure: CYSTOSCOPY WITH RETROGRADE PYELOGRAM, URETEROSCOPY AND STENT EXCHANGE;  Surgeon: Marco Severs, MD;  Location: [facility];  Service: Urology;  Laterality: Bilateral;   DILATION AND CURETTAGE OF UTERUS  12 yrs ago   DILATION AND CURETTAGE OF UTERUS  06/18/2011   Procedure: DILATATION AND CURETTAGE;  Surgeon: Wendelyn Halter, MD;  Location: [facility];  Service: Gynecology;  Laterality: N/A;  Suction Dilation and Curettage    ESOPHAGOGASTRODUODENOSCOPY N/A 10/12/2013   Dr. Rourk:anastomotic ulcer likely cause of bleeding. likely ischemic    ESOPHAGOGASTRODUODENOSCOPY (EGD) WITH PROPOFOL  N/A 01/19/2014   [id number]   EXTRACORPOREAL SHOCK WAVE LITHOTRIPSY Left 01/11/2018   Procedure: LEFT EXTRACORPOREAL SHOCK WAVE LITHOTRIPSY (ESWL);  Surgeon: Samson Croak, MD;  Location: [facility];  Service: Urology;  Laterality: Left;   EXTRACORPOREAL SHOCK WAVE LITHOTRIPSY Left 01/14/2018   Procedure: LEFT EXTRACORPOREAL SHOCK WAVE LITHOTRIPSY (ESWL);  Surgeon: Marco Severs, MD;  Location: [facility];  Service: Urology;  Laterality: Left;  75 MINS  W/ MAC   GASTRIC BYPASS  2013   [facility]   HOLMIUM LASER APPLICATION Bilateral 03/06/2022   Procedure: HOLMIUM LASER APPLICATION;  Surgeon: Marco Severs, MD;  Location: [facility];  Service: Urology;  Laterality: Bilateral;   HYSTEROSCOPY WITH D & C  06/18/2011   Procedure: DILATATION AND CURETTAGE /HYSTEROSCOPY;  Surgeon: Wendelyn Halter, MD;  Location: [facility];  Service: Gynecology;  Laterality: N/A;   LITHOTRIPSY     paniculectomy     SHOULDER ARTHROSCOPY WITH SUBACROMIAL DECOMPRESSION AND OPEN ROTATOR C Left 07/16/2021   Procedure: LEFT ARTHROSCOPIC SUBACROMIAL DECOMPRESSION, MINI OPEN ROTATOR CUFF TEAR REPAIR, BICEPS TENOTOMY;  Surgeon: Shirlee Dotter, MD;  [facility]: [facility];  Service: Orthopedics;  Laterality: Left;   STONE EXTRACTION WITH BASKET Bilateral 03/06/2022   Procedure: STONE EXTRACTION WITH BASKET;  Surgeon: Marco Severs, MD;  [facility]: [facility];  Service: Urology;  Laterality: Bilateral;   TOTAL KNEE ARTHROPLASTY Left 04/17/2023   Procedure: LEFT TOTAL KNEE ARTHROPLASTY;  Surgeon: Arnie Lao, MD;  [facility];  Service: Orthopedics;  Laterality: Left;   TRIGGER FINGER RELEASE     WISDOM TOOTH EXTRACTION     Past Surgical History:  Procedure Laterality Date   ABDOMINAL HYSTERECTOMY     endometrial cancer   CHOLECYSTECTOMY      COLONOSCOPY WITH PROPOFOL  N/A 01/19/2014   [id number]   CYSTOSCOPY W/ URETERAL STENT PLACEMENT Bilateral 02/07/2022   Procedure: CYSTOSCOPY WITH RETROGRADE PYELOGRAM/RIGHT URETERAL STENT PLACEMENT WITH POSSIBLE LEFT;  Surgeon: Lahoma Pigg, MD;  [facility];  Service: Urology;  Laterality: Bilateral;   CYSTOSCOPY WITH RETROGRADE PYELOGRAM, URETEROSCOPY AND STENT PLACEMENT Bilateral 03/06/2022   Procedure: CYSTOSCOPY WITH RETROGRADE PYELOGRAM, URETEROSCOPY AND STENT EXCHANGE;  Surgeon: Marco Severs, MD;  [facility];  Service: Urology;  Laterality: Bilateral;   DILATION AND CURETTAGE OF UTERUS  12 yrs ago   DILATION AND CURETTAGE OF UTERUS  06/18/2011   Procedure: DILATATION AND CURETTAGE;  Surgeon: Wendelyn Halter, MD;  Location: [facility];  Service: Gynecology;  Laterality: N/A;  Suction Dilation and Curettage   ESOPHAGOGASTRODUODENOSCOPY N/A 10/12/2013   Dr. Rourk:anastomotic ulcer likely cause of bleeding. likely ischemic    ESOPHAGOGASTRODUODENOSCOPY (EGD) WITH PROPOFOL  N/A 01/19/2014   [id number]   EXTRACORPOREAL SHOCK WAVE LITHOTRIPSY Left 01/11/2018   Procedure: LEFT EXTRACORPOREAL SHOCK WAVE LITHOTRIPSY (ESWL);  Surgeon: Samson Croak, MD;  [facility];  Service: Urology;  Laterality: Left;   EXTRACORPOREAL SHOCK WAVE LITHOTRIPSY Left 01/14/2018   Procedure: LEFT EXTRACORPOREAL SHOCK WAVE LITHOTRIPSY (ESWL);  Surgeon: Marco Severs, MD;  [facility];  Service: Urology;  Laterality: Left;  75 MINS  W/ MAC   GASTRIC BYPASS  2013   [facility]   HOLMIUM LASER APPLICATION Bilateral 03/06/2022   Procedure: HOLMIUM LASER APPLICATION;  Surgeon: Marco Severs, MD;  Location: [facility];  Service: Urology;  Laterality: Bilateral;   HYSTEROSCOPY WITH D & C  06/18/2011   Procedure: DILATATION AND CURETTAGE /HYSTEROSCOPY;  Surgeon: Wendelyn Halter, MD;  Location: [facility];  Service: Gynecology;  Laterality: N/A;   LITHOTRIPSY     paniculectomy     SHOULDER  ARTHROSCOPY WITH SUBACROMIAL DECOMPRESSION AND OPEN ROTATOR C Left 07/16/2021   Procedure: LEFT ARTHROSCOPIC SUBACROMIAL DECOMPRESSION, MINI OPEN ROTATOR CUFF TEAR REPAIR, BICEPS TENOTOMY;  Surgeon: Shirlee Dotter, MD;  [facility];  Service: Orthopedics;  Laterality: Left;   STONE EXTRACTION WITH BASKET Bilateral 03/06/2022   Procedure: STONE EXTRACTION WITH BASKET;  Surgeon: Marco Severs, MD;  [facility];  Service: Urology;  Laterality: Bilateral;   TOTAL KNEE ARTHROPLASTY Left 04/17/2023   Procedure: LEFT TOTAL KNEE ARTHROPLASTY;  Surgeon: Arnie Lao, MD;  [facility];  Service: Orthopedics;  Laterality: Left;   TRIGGER FINGER RELEASE     WISDOM TOOTH EXTRACTION     Past Medical History:  Diagnosis Date   Allergy    Anemia    Anxiety    Arthritis    knees, multiple joints   Asthma    Bell palsy    Blood transfusion without reported diagnosis    Breast mass    lt breast mass x's 3 years increased in size   Chronic kidney disease    Chronic pain syndrome    COPD (chronic obstructive pulmonary disease) (HCC)    Depression  Diabetes mellitus    Endometrial cancer (HCC)    Fibromyalgia    GERD (gastroesophageal reflux disease)    H/O gastric bypass 05/09/2014   At [facility]   Headache    mirgraines   Heart murmur    Hyperlipidemia    Hypertension    Hypothyroidism    Multiple pulmonary nodules 11/30/2012   Followed in Pulmonary clinic/ [facility]/ [place]  - See CT abd  10/29/12  New right lower lobe pulmonary nodularity, primarily ground-  glass in density. This could reflect an inflammatory process,  although follow-up is necessary to exclude atypical neoplasm. Full  chest CT should be considered to evaluate for other pulmonary  findings.      Multiple thyroid  nodules    Nephropathy    Pneumonia    PONV (postoperative nausea and vomiting)    Pulmonary nodules    Thyroid  disease    Ulcer    Uterine cancer (HCC) 12/26/2011   Stage  1, grade 1, S/P hysterectomy initially by robotic technique and then [facility]oophorectomy at [facility] receiving no postoperative treatment.   LMP 05/06/2011   Opioid Risk Score:   Fall Risk Score:  `1  Depression screen [facility] 2/9     08/11/2023   11:18 AM 04/07/2023   10:25 AM 03/02/2023   10:20 AM 01/02/2023    1:45 PM 09/15/2022   11:06 AM 08/18/2022    2:11 PM 10/30/2016    4:49 PM  Depression screen PHQ 2/9  Decreased Interest 1 1 0 1 2 2  0  Down, Depressed, Hopeless 1 1 0 1 2 2  0  PHQ - 2 Score 2 2 0 2 4 4  0  Altered sleeping      3   Tired, decreased energy      2   Change in appetite      2   Feeling bad or failure about yourself       2   Trouble concentrating      2   Moving slowly or fidgety/restless      0   Suicidal thoughts      0   PHQ-9 Score      15   Difficult doing work/chores      Extremely dIfficult       Review of Systems  Musculoskeletal:  Positive for back pain, gait problem and neck pain.       B/L hand, knees,  right foot pain, left shoulder  All other systems reviewed and are negative.      Objective:   Physical Exam   Gen: no distress, normal appearing HEENT: oral mucosa pink and moist, NCAT Chest: normal effort, normal rate of breathing Abd: soft, non-distended Ext: no edema Psych:  pleasant and appropriate  Skin: intact Neuro: Alert and awake, follows commands, cranial nerves II through XII grossly intact DTR normal and symmetric No focal motor or sensory deficits noted Musculoskeletal:  TTP anterior left shoulder, pain with shoulder abduction Mild TTP lumbar paraspinal muscles Mild TTP C spine paraspinal mucles Minimal TTP knee joint line on right Mild TTP anterior L knee   Mild diffuse tenderness throughout her upper and lower extremities to palpation-not checked today     Xray L knee result 03/10/22 Three-view radiographs of the left knee were obtained today.  She has  overall well-maintained alignment she has advanced  end-stage  osteoarthritis of all 3 compartments but most notable in the  patellofemoral compartment and medial compartment.  She has periarticular  osteophytes  no acute fractures are noted    Xray L knee result 12/02/21  Radiographs of her right knee in 3 projections were reviewed today.  She  has tricompartmental arthritis with most changes in the medial compartment  with near bone-on-bone.  Also patellofemoral sclerosis and osteophyte  formation no acute fractures         Assessment & Plan:   Knee OA bilateral -Could consider genicular nerve block if she does not have surgery -S/p L TKA by Molly Berry  04/17/23 -ORT low  -Continue UDS and pill counts.  Continue PDMP monitoring.  Pain contract completed prior visit. -Discussed bringing pill bottle with any medications even if empty to all appointments  -Decrease to Percocet 7.5 every 4 hours as needed, up to 5 times a day total -Continue tizanidine  as needed -Continue gabapentin  1200 mg twice daily -Patient considering right knee replacement   B/L shoulder pain left greater then right -Followed by ortho Dr. Vaughn Georges- has ordered PT, considering MRI -Continue medications as above  Fibromyalgia  -Continue tizanidine  as needed -Continue gabapentin  1200 mg twice daily -Aquatic therapy limited by transportation -Zynex Nexwave device, Zynex Cryoheat blanket ordered prior visit -We discussed trying to do self exercises at her local pool.  - Recommend tai chi -She does have frequent stiffness in her joints , could consider rheumatology consult to rule out other rheumatological causes.   Right back and flank pain with Hx of Kidney stones -Improved, continue follow-up with urology as directed

## 2023-10-18 ENCOUNTER — Other Ambulatory Visit: Payer: Self-pay

## 2023-10-18 ENCOUNTER — Emergency Department (HOSPITAL_COMMUNITY)

## 2023-10-18 ENCOUNTER — Emergency Department (HOSPITAL_COMMUNITY)
Admission: EM | Admit: 2023-10-18 | Discharge: 2023-10-18 | Disposition: A | Discharge status: Home / Self Care | Attending: Emergency Medicine | Admitting: Emergency Medicine

## 2023-10-18 DIAGNOSIS — E119 Type 2 diabetes mellitus without complications: Secondary | ICD-10-CM | POA: Diagnosis not present

## 2023-10-18 DIAGNOSIS — Z91040 Latex allergy status: Secondary | ICD-10-CM | POA: Insufficient documentation

## 2023-10-18 DIAGNOSIS — R509 Fever, unspecified: Secondary | ICD-10-CM | POA: Insufficient documentation

## 2023-10-18 LAB — URINALYSIS, W/ REFLEX TO CULTURE (INFECTION SUSPECTED)
Bacteria, UA: NONE SEEN
Bilirubin Urine: NEGATIVE
Glucose, UA: NEGATIVE mg/dL
Hgb urine dipstick: NEGATIVE
Ketones, ur: NEGATIVE mg/dL
Leukocytes,Ua: NEGATIVE
Nitrite: NEGATIVE
Protein, ur: 30 mg/dL — AB
Specific Gravity, Urine: 1.03 (ref 1.005–1.030)
pH: 5 (ref 5.0–8.0)

## 2023-10-18 LAB — COMPREHENSIVE METABOLIC PANEL WITH GFR
ALT: 16 U/L (ref 0–44)
AST: 17 U/L (ref 15–41)
Albumin: 3.5 g/dL (ref 3.5–5.0)
Alkaline Phosphatase: 83 U/L (ref 38–126)
Anion gap: 10 (ref 5–15)
BUN: 20 mg/dL (ref 6–20)
CO2: 24 mmol/L (ref 22–32)
Calcium: 8.8 mg/dL — ABNORMAL LOW (ref 8.9–10.3)
Chloride: 103 mmol/L (ref 98–111)
Creatinine, Ser: 0.96 mg/dL (ref 0.44–1.00)
GFR, Estimated: >60 mL/min (ref >60)
Glucose, Bld: 93 mg/dL (ref 70–99)
Potassium: 4.2 mmol/L (ref 3.5–5.1)
Sodium: 137 mmol/L (ref 135–145)
Total Bilirubin: 0.7 mg/dL (ref 0.0–1.2)
Total Protein: 6.4 g/dL — ABNORMAL LOW (ref 6.5–8.1)

## 2023-10-18 LAB — CBC WITH DIFFERENTIAL/PLATELET
Abs Immature Granulocytes: 0.02 10*3/uL (ref 0.00–0.07)
Basophils Absolute: 0 10*3/uL (ref 0.0–0.1)
Basophils Relative: 1 %
Eosinophils Absolute: 0.1 10*3/uL (ref 0.0–0.5)
Eosinophils Relative: 2 %
HCT: 36.3 % (ref 36.0–46.0)
Hemoglobin: 11.1 g/dL — ABNORMAL LOW (ref 12.0–15.0)
Immature Granulocytes: 0 %
Lymphocytes Relative: 10 %
Lymphs Abs: 0.5 10*3/uL — ABNORMAL LOW (ref 0.7–4.0)
MCH: 28.7 pg (ref 26.0–34.0)
MCHC: 30.6 g/dL (ref 30.0–36.0)
MCV: 93.8 fL (ref 80.0–100.0)
Monocytes Absolute: 0.5 10*3/uL (ref 0.1–1.0)
Monocytes Relative: 10 %
Neutro Abs: 4 10*3/uL (ref 1.7–7.7)
Neutrophils Relative %: 77 %
Platelets: 220 10*3/uL (ref 150–400)
RBC: 3.87 MIL/uL (ref 3.87–5.11)
RDW: 12.3 % (ref 11.5–15.5)
WBC: 5.2 10*3/uL (ref 4.0–10.5)
nRBC: 0 % (ref 0.0–0.2)

## 2023-10-18 LAB — RESP PANEL BY RT-PCR (RSV, FLU A&B, COVID)  RVPGX2
Influenza A by PCR: NEGATIVE
Influenza B by PCR: NEGATIVE
Resp Syncytial Virus by PCR: NEGATIVE
SARS Coronavirus 2 by RT PCR: NEGATIVE

## 2023-10-18 LAB — LACTIC ACID, PLASMA: Lactic Acid, Venous: 0.9 mmol/L (ref 0.5–1.9)

## 2023-10-18 NOTE — ED Provider Notes (Signed)
 [place] EMERGENCY DEPARTMENT AT [facility] Provider Note   CSN: [id number] Arrival date & time: 10/18/23  4098     Patient presents with: Shortness of Breath and Fever   Molly Berry is a 58 y.o. female.    Shortness of Breath Associated symptoms: fever   Fever  This patient is a 58 year old female, she reports that she is a well-controlled diabetic, she also has fibromyalgia, she is on chronic pain medication, she is on hypertension medications and she takes Xanax  for sleep and anxiety.  She presents to the hospital with a fever which she reported was up to 102.6 last night, after taking ibuprofen  is down to 99.5.  She denies abdominal pain flank pain or dysuria although she does report that 3 days ago she was at the urgent care complaining of some mild urinary symptoms and was told that her urine sample was negative.  She was nauseated and was given Phenergan  which helped.  She now feels like she has a little tightness in her chest, no cough but has been around her significant other and her son both of her had a viral upper respiratory illness this week.  She denies sore throat, runny nose, rashes, swelling, dysuria.  She does endorse having occasional watery diarrhea including yesterday.    Prior to Admission medications   Medication Sig Start Date End Date Taking? Authorizing Provider  albuterol  (PROVENTIL  HFA;VENTOLIN  HFA) 108 (90 BASE) MCG/ACT inhaler Inhale 2 puffs into the lungs every 6 (six) hours as needed for shortness of breath or wheezing.    [provider]  ALPRAZolam  (XANAX ) 1 MG tablet Take 1 mg by mouth 2 (two) times daily as needed for anxiety or sleep.    [provider]  AUVELITY 45-105 MG TBCR Take 1 tablet by mouth in the morning and at bedtime. 05/25/23   [provider]  cholecalciferol  (VITAMIN D3) 25 MCG (1000 UNIT) tablet Take 1,000 Units by mouth daily. 06/07/15   [provider]  Dextromethorphan-Bupropion   (AUVELITY PO) Take 1 tablet by mouth daily.    [provider]  dicyclomine  (BENTYL ) 20 MG tablet Take 20 mg by mouth as needed for spasms. 03/30/21   [provider]  diphenhydrAMINE  (BENADRYL ) 25 mg capsule Take 50 mg by mouth at bedtime.    [provider]  esomeprazole (NEXIUM) 40 MG capsule Take 40 mg by mouth daily. 12/11/21   [provider]  famotidine  (PEPCID ) 40 MG tablet Take 40 mg by mouth at bedtime.    [provider]  furosemide  (LASIX ) 20 MG tablet Take 20 mg by mouth daily as needed for fluid or edema. 02/15/14   [provider]  gabapentin  (NEURONTIN ) 600 MG tablet Take 1,200 mg by mouth 2 (two) times daily.    [provider]  indapamide  (LOZOL ) 2.5 MG tablet take 1 tablet by mouth once daily. 10/24/22   McKenzie, Arden Beck, MD  lamoTRIgine  (LAMICTAL ) 200 MG tablet Take 200 mg by mouth 2 (two) times daily.    [provider]  metoprolol  succinate (TOPROL -XL) 50 MG 24 hr tablet Take 50 mg by mouth daily. 12/10/16   [provider]  MOUNJARO 2.5 MG/0.5ML Pen Inject 2.5 mg into the skin once a week.    [provider]  nitrofurantoin , macrocrystal-monohydrate, (MACROBID ) 100 MG capsule Take 1 capsule (100 mg total) by mouth every 12 (twelve) hours. 04/06/23   Lauretta Ponto, FNP  ondansetron  (ZOFRAN ) 8 MG tablet Take 1 tablet (  8 mg total) by mouth every 8 (eight) hours as needed for nausea or vomiting. Patient taking differently: Take 8 mg by mouth as needed for nausea or vomiting. 03/06/22   McKenzie, Arden Beck, MD  oxyCODONE -acetaminophen  (PERCOCET) 7.5-325 MG tablet Take 1 tablet by mouth every 4 (four) hours as needed for severe pain (pain score 7-10). No more then 5 dose a day 10/15/23 10/14/24  Lylia Sand, MD  oxyCODONE -acetaminophen  (PERCOCET) 7.5-325 MG tablet Take 1 tablet by mouth every 4 (four) hours as needed for severe pain (pain score 7-10). No more then 5 doses a day 10/15/23    Lylia Sand, MD  OZEMPIC, 0.25 OR 0.5 MG/DOSE, 2 MG/3ML SOPN Inject 0.25 mg into the skin once a week. 08/25/23   [provider]  simvastatin  (ZOCOR ) 20 MG tablet Take 20 mg by mouth at bedtime. 05/29/16   [provider]  SUMAtriptan  (IMITREX ) 100 MG tablet Take 100 mg by mouth every 2 (two) hours as needed for migraine. 07/12/16   [provider]  tiZANidine  (ZANAFLEX ) 2 MG tablet Take 1 tablet (2 mg total) by mouth every 6 (six) hours as needed for muscle spasms. 05/20/23   Arnie Lao, MD  TRULICITY 3 MG/0.5ML SOAJ Inject 3 mg into the skin once a week. Every Tuesday    [provider]  Vitamin D , Ergocalciferol , (DRISDOL ) 1.25 MG (50000 UNIT) CAPS capsule Take 50,000 Units by mouth 2 (two) times a week.    [provider]    Allergies: Bee venom, Cephalexin, Ciprofloxacin , Penicillins, Vancomycin , Carbamazepine , Cefotaxime, Doxycycline , Sulfacetamide sodium, Adhesive [tape], Clindamycin , Latex, and Sulfa antibiotics    Review of Systems  Constitutional:  Positive for fever.  Respiratory:  Positive for shortness of breath.   All other systems reviewed and are negative.   Updated Vital Signs BP (!) 102/58   Pulse 71   Temp 99.5 F (37.5 C) (Oral)   Resp 16   Ht 1.676 m (5' 6)   Wt 104.9 kg   LMP 05/06/2011   SpO2 100%   BMI 37.32 kg/m   Physical Exam Vitals and nursing note reviewed.  Constitutional:      General: She is not in acute distress.    Appearance: She is well-developed.  HENT:     Head: Normocephalic and atraumatic.     Mouth/Throat:     Pharynx: No oropharyngeal exudate.   Eyes:     General: No scleral icterus.       Right eye: No discharge.        Left eye: No discharge.     Conjunctiva/sclera: Conjunctivae normal.     Pupils: Pupils are equal, round, and reactive to light.   Neck:     Thyroid : No thyromegaly.     Vascular: No JVD.   Cardiovascular:     Rate and Rhythm: Normal rate and  regular rhythm.     Heart sounds: Normal heart sounds. No murmur heard.    No friction rub. No gallop.  Pulmonary:     Effort: Pulmonary effort is normal. No respiratory distress.     Breath sounds: Normal breath sounds. No wheezing or rales.  Abdominal:     General: Bowel sounds are normal. There is no distension.     Palpations: Abdomen is soft. There is no mass.     Tenderness: There is no abdominal tenderness.   Musculoskeletal:        General: No tenderness. Normal range of motion.     Cervical  back: Normal range of motion and neck supple.     Right lower leg: No edema.     Left lower leg: No edema.  Lymphadenopathy:     Cervical: No cervical adenopathy.   Skin:    General: Skin is warm and dry.     Findings: No erythema or rash.   Neurological:     Mental Status: She is alert.     Coordination: Coordination normal.   Psychiatric:        Behavior: Behavior normal.     (all labs ordered are listed, but only abnormal results are displayed) Labs Reviewed  COMPREHENSIVE METABOLIC PANEL WITH GFR - Abnormal; Notable for the following components:      Result Value   Calcium 8.8 (*)    Total Protein 6.4 (*)    All other components within normal limits  CBC WITH DIFFERENTIAL/PLATELET - Abnormal; Notable for the following components:   Hemoglobin 11.1 (*)    Lymphs Abs 0.5 (*)    All other components within normal limits  URINALYSIS, W/ REFLEX TO CULTURE (INFECTION SUSPECTED) - Abnormal; Notable for the following components:   Protein, ur 30 (*)    All other components within normal limits  RESP PANEL BY RT-PCR (RSV, FLU A&B, COVID)  [id number]  LACTIC ACID, PLASMA    EKG: EKG Interpretation Date/Time:  Sunday October 18 2023 10:33:59 EDT Ventricular Rate:  83 PR Interval:  171 QRS Duration:  98 QT Interval:  356 QTC Calculation: 419 R Axis:   -30  Text Interpretation: Sinus rhythm Inferior infarct, old Consider anterior infarct Confirmed by Early Glisson ([id number]) on  10/18/2023 10:52:43 AM  Radiology: Lenell Query Chest Port 1 View Result Date: 10/18/2023 CLINICAL DATA:  [id number] Sepsis [facility]) [id number] EXAM: PORTABLE CHEST 1 VIEW COMPARISON:  February 19, 2022 FINDINGS: The cardiomediastinal silhouette is unchanged and enlarged in contour. No pleural effusion. No pneumothorax. No acute pleuroparenchymal abnormality. IMPRESSION: No acute cardiopulmonary abnormality. Electronically Signed   By: Clancy Crimes M.D.   On: 10/18/2023 11:15     Procedures   Medications Ordered in the ED - No data to display                                  Medical Decision Making Amount and/or Complexity of Data Reviewed Labs: ordered. Radiology: ordered.    This patient presents to the ED for concern of reported fever, this involves an extensive number of treatment options, and is a complaint that carries with it a high risk of complications and morbidity.  The differential diagnosis includes underlying infection, virus, UTI.  The patient does report that she has a history of recurrent kidney stones and is actually been septic several times from the kidney stones in the past.  Going back over the medical record for the last 12 months there has been no admissions to the hospital for anything regarding infection.  She was admitted in November 2023 for bilateral ureteric calculi and had cystoscopy with ureteral stents, admitted to the hospital for pyelonephritis in 2023 just prior to that.  When she was at the urgent care several days ago they told her that her urinalysis was unremarkable   Co morbidities / Chronic conditions that complicate the patient evaluation  History of recurrent kidney stones and pyelonephritis   Additional history obtained:  Additional history obtained from EMR External records from outside source obtained and reviewed including prior admissions to  the hospital in 2023   Lab Tests:  I Ordered, and personally interpreted labs.  The pertinent results  include: No leukocytosis, negative for COVID and the flu, metabolic panel unremarkable   Imaging Studies ordered:  I ordered imaging studies including chest x-ray I independently visualized and interpreted imaging which showed no acute findings I agree with the radiologist interpretation   Cardiac Monitoring: / EKG:  The patient was maintained on a cardiac monitor.  I personally viewed and interpreted the cardiac monitored which showed an underlying rhythm of: Normal sinus rhythm   Problem List / ED Course / Critical interventions / Medication management  The patient has not been hypoxic in fact when she ambulated she only dropped to 93%.  She has no wheezing, clear lungs on x-ray and labs which were unremarkable Patient was counseled on use of antipyretics and close follow-up I have reviewed the patients home medicines and have made adjustments as needed   Social Determinants of Health:  None   Test / Admission - Considered:  Considered admission but no source of fever found, she is not febrile here, vitals are unremarkable labs are stable. I have discussed with the patient at the bedside the results, and the meaning of these results.  They have had opportunity to ask questions,  expressed their understanding to the need for follow-up with primary care physician      Final diagnoses:  Fever, unspecified fever cause    ED Discharge Orders     None          Early Glisson, MD 10/18/23 1241

## 2023-10-18 NOTE — Discharge Instructions (Addendum)
 For fever control as an adult more than 160 pounds,, you may take Ibuprofen  or tylenol  maximum dose tylenol  650mg  and Ibuprofen  600mg  alternating every 4 hours.  For children the dose is ibuprofen  10mg  / kg and tylenol  15mg  / kg alternating every 4 hours.  No pneumonia, your blood work was unremarkable and reassuring, no signs of COVID or the flu, you probably have the same virus that your family had and you can expect to be sick for several days  Thankfully your testing is totally unremarkable, there is no signs of bacterial infection, no signs of pneumonia, no signs of COVID or the flu.  Please make sure that you are using your albuterol  inhaler exactly as prescribed 2 puffs every 6 hours as needed.  Thank you for allowing us  to treat you in the emergency department today.  After reviewing your examination and potential testing that was done it appears that you are safe to go home.  I would like for you to follow-up with your doctor within the next several days, have them obtain your records and follow-up with them to review all potential tests and results from your visit.  If you should develop severe or worsening symptoms return to the emergency department immediately

## 2023-10-18 NOTE — ED Triage Notes (Signed)
 Pt c/o fever, SOB, flank pain, nausea since Thurs. Pt stated fever was as high as 102.0

## 2023-10-18 NOTE — ED Notes (Signed)
 Walking oxygen level 90-94%; mostly 92%-93%. Dr. Annabell Key sent a secure chat about patients walking oxygen level.

## 2023-10-23 ENCOUNTER — Ambulatory Visit (HOSPITAL_COMMUNITY): Admitting: Occupational Therapy

## 2023-11-02 ENCOUNTER — Encounter (HOSPITAL_COMMUNITY): Admitting: Occupational Therapy

## 2023-11-03 ENCOUNTER — Encounter (HOSPITAL_COMMUNITY): Admitting: Occupational Therapy

## 2023-11-05 ENCOUNTER — Ambulatory Visit: Admitting: Sports Medicine

## 2023-11-10 ENCOUNTER — Other Ambulatory Visit: Payer: Self-pay | Admitting: Physician Assistant

## 2023-11-10 DIAGNOSIS — N641 Fat necrosis of breast: Secondary | ICD-10-CM

## 2023-11-12 ENCOUNTER — Other Ambulatory Visit: Payer: Self-pay | Admitting: Physician Assistant

## 2023-11-12 DIAGNOSIS — Z1231 Encounter for screening mammogram for malignant neoplasm of breast: Secondary | ICD-10-CM

## 2023-11-16 ENCOUNTER — Encounter

## 2023-11-16 ENCOUNTER — Other Ambulatory Visit

## 2023-11-17 ENCOUNTER — Telehealth (HOSPITAL_COMMUNITY): Payer: Self-pay | Admitting: Occupational Therapy

## 2023-11-17 ENCOUNTER — Encounter (HOSPITAL_COMMUNITY): Admitting: Occupational Therapy

## 2023-11-17 NOTE — Telephone Encounter (Signed)
 This OT spoke with patient regarding her No Show to her Evaluation. Pt reported that she has Pneumonia and forgot about the appointment for OT. She will call this office back once she is feeling better to reschedule.   Valentin Nightingale, OTR/L [facility] [phone]

## 2023-11-26 ENCOUNTER — Ambulatory Visit

## 2023-12-15 ENCOUNTER — Encounter (INDEPENDENT_AMBULATORY_CARE_PROVIDER_SITE_OTHER): Payer: Self-pay | Admitting: *Deleted

## 2023-12-15 ENCOUNTER — Encounter: Attending: Physical Medicine & Rehabilitation | Admitting: Physical Medicine & Rehabilitation

## 2023-12-15 ENCOUNTER — Encounter: Payer: Self-pay | Admitting: Physical Medicine & Rehabilitation

## 2023-12-15 VITALS — BP 114/78 | HR 63 | Ht 66.0 in | Wt 233.8 lb

## 2023-12-15 DIAGNOSIS — Z79891 Long term (current) use of opiate analgesic: Secondary | ICD-10-CM | POA: Diagnosis not present

## 2023-12-15 DIAGNOSIS — M170 Bilateral primary osteoarthritis of knee: Secondary | ICD-10-CM | POA: Insufficient documentation

## 2023-12-15 DIAGNOSIS — M25512 Pain in left shoulder: Secondary | ICD-10-CM | POA: Diagnosis present

## 2023-12-15 DIAGNOSIS — M797 Fibromyalgia: Secondary | ICD-10-CM | POA: Diagnosis present

## 2023-12-15 DIAGNOSIS — M25511 Pain in right shoulder: Secondary | ICD-10-CM | POA: Diagnosis present

## 2023-12-15 DIAGNOSIS — Z5181 Encounter for therapeutic drug level monitoring: Secondary | ICD-10-CM | POA: Diagnosis present

## 2023-12-15 DIAGNOSIS — G894 Chronic pain syndrome: Secondary | ICD-10-CM | POA: Insufficient documentation

## 2023-12-15 MED ORDER — OXYCODONE-ACETAMINOPHEN 7.5-325 MG PO TABS: 1.0000 | ORAL_TABLET | Freq: Four times a day (QID) | ORAL | 0 refills | Status: DC | PRN

## 2023-12-15 MED ORDER — TIZANIDINE HCL 2 MG PO TABS: 2.0000 mg | ORAL_TABLET | Freq: Two times a day (BID) | ORAL | 5 refills | Status: AC | PRN

## 2023-12-15 NOTE — Progress Notes (Signed)
 Subjective:    Patient ID: Molly Berry, female    DOB: 1966-03-10, 58 y.o.   MRN: [id number]  HPI  HPI 08/18/22 Molly Berry is a 58 y.o. year old female  who  has a past medical history of Allergy, Anemia, Anxiety, Arthritis, Asthma, Bell palsy, Blood transfusion without reported diagnosis, Breast mass, Chronic kidney disease, Chronic pain syndrome, COPD (chronic obstructive pulmonary disease), Depression, Diabetes mellitus, Endometrial cancer, Fibromyalgia, GERD (gastroesophageal reflux disease), H/O gastric bypass (05/09/2014), Headache, Hyperlipidemia, Hypertension, Hypothyroidism, Multiple pulmonary nodules (11/30/2012), Multiple thyroid  nodules, Nephropathy, Pneumonia, PONV (postoperative nausea and vomiting), Pulmonary nodules, Thyroid  disease, Ulcer, and Uterine cancer (12/26/2011).   They are presenting to PM&R clinic as a new patient for pain management evaluation.  Molly Berry reports that most of her pain is related to her history of fibromyalgia and osteoarthritis particular of her knees.  She has widespread pain throughout her whole body from fibromyalgia.  She has had knee pain for several years and her knees are her worst area of pain. It was initially worse in her right knee, then her left knee became more severe.  She reports she had cortisone injections with slight benefit however she cannot get gel injections approved by insurance.  She would like to have knee surgery and is followed by orthopedics most recently seen by Ronal Bach, PA.  She is working on weight loss in order to be able to qualify for surgery.  She was previously seen by [facility] pain [facility].  Her pain was previously managed with oxycodone  10 mg 3 times a day.  She reports she was with this practice for about 7 years.  She does report she was discharged after she took a tramadol by a family member gave her when she had had multiple procedures and surgeries for renal stones.  Since this time her PCP has been  prescribing her oxycodone  10 mg.     No red flags for back pain endorsed in Hx or ROS   Medications tried: Nsaids meloxicam  helps, rare use due to concern for kidneys Tylenol  - helps slightly  Opiates  oxycodone  10mg  helps, has used this for many years Gabapentin  1200mg  BID  Lyrica- reports reason she can't take it but doesn't remember what that was  Lamictal  for mood  Buprenorphine - bad feeling  Tramadol didn't help  TCAs  - didn't help SNRIs  cymbalta, did not tolerate it  Other   Tizanidine /robaxin  slightly helps    Other treatments: PT/OT  - helped in the past, she likes integrative therapy TENs unit - made her sore  Injections - knee cortisone   Interval history 09/15/2022 Patient also reports she has been doing well overall regarding her pain control.  Her pain has been severe in her right back and flank recently.  She reports follow-up with urology today in clinic, says she has had multiple issues with kidney stones.  She also had a UA ordered today.  She was started on Macrobid .  Prior to this her pain was much better controlled on Percocet 7.5 mg 4 times a day.  This was providing more consistent pain control than Percocet 10 mg 3 times a day.  Patient reports she got message from [facility]  however needs to call them back.   Interval history 09/15/2022 Patient also reports she has been doing well overall regarding her pain control.  Her pain has been severe in her right back and flank recently.  She reports follow-up with urology today in clinic,  says she has had multiple issues with kidney stones.  She also had a UA ordered today.  She was started on Macrobid .  Prior to this her pain was much better controlled on Percocet 7.5 mg 4 times a day.  This was providing more consistent pain control than Percocet 10 mg 3 times a day.  Patient reports she got message from [facility]  however needs to call them back.   Interval history 03/02/23 Molly Berry is here for follow-up regarding her  chronic pain.  She reports that pain continues to be poorly controlled.  Her worst pain is at her knees however she has pain all throughout her body.  Percocet 7.5 mg helps some days, other days her pain continues to be very severe.  No side effects with the medication.  She is planning on having knee replacement surgery with Dr. Vernetta.  Interval history 04/07/23 Patient here for follow-up regarding her chronic pain.  Percocet continues to help keep her pain more tolerable.  She does have left TKA scheduled with Dr. Vernetta on 04/17/2023.  She is a little concerned about postoperative pain.  She has been limited in her ability to start physical therapy because her truck is having mechanical issues.  Interval history 05/28/2023 Patient reports she is doing well after her knee replacement.  She was seen by Dr. Vernetta yesterday, note indicates that Dr. Vernetta would like her to follow-up with pain management for continued pain treatment.  She has been doing fairly well on Dilaudid  2 mg every 4 hours as needed.  Patient does report that she thinks this has been causing her GI upset.  She did not have the symptoms with oxycodone .  She has run out of Dilaudid  yesterday and restarted with Percocet 7.5 2 tabs every 4 hours.  She reports she tried 1 tab of Percocet 7.5 but she cannot tolerate the pain.  She is not having any side effects with Percocet.  She reports that Dr. Vernetta is overall happy with her progress.  She is considering having her other knee replaced as well.  Interval history 06/30/23 Patient reports her knee pain is doing better.  She is ready to decrease Percocet to 7.5 dose from the Percocet 10 dose.  She would also like to lower this for when she decides to have her other knee replaced.  She is not having any side effects with the medication.  She continues to have a lot of pain throughout her body.  She continues to be limited in her activity level.  We discussed trying tai chi, even  for about 5 minutes a day and potentially working up.  We also discussed trying aquatic therapy unfortunately she is not close to aquatic therapy center.  She might try joining the [facility] where they have a pool where she can do exercises on her own or potentially do water  aerobics there.   She had problems with prior Auth for the medication but was able to get the Percocet.  Interval history 08/11/22 Patient is here for follow-up up about her chronic pain.  Left knee pain is gradually improving, she is happy with the results overall.  Right knee continues to be very painful.  She has been gradually increasing her activity level.  She is able to ride a horse for about an hour, this is something she very much enjoys doing.  No side effects with the Percocet 7.5 mg, using 5-6 times a day.  She is going to try decrease frequency of use  to 4 times a day.  She is considering surgery for her right knee late April or early May potentially.   Interval History 10/15/23 She has been having pain in her shoulders. She is followed by orthopaedics, Dr. Burnetta.  R shoulder pain improved with cortisone injection with ortho, L shoulder continues to be painful. She is followed with Dr. Burnetta, possibly rotator cuff disorder. PT has been ordered. She is noted to have recent R bicep tendon rupture. He has ordered PT and considering MRI L shoulder.  Percocet doing well at current dose. She is taking about 5 tabs percocet per day and this is helping control her overall pain. No side effects with the medication.   R knee pain is doing OK. L knee has been a little more painful.    Interval History 10/15/23 Patient is here for follow-up of her chronic pain.  Pain has been doing better overall, not having to use Percocet as frequently.  Percocet continues to help her pain, no significant side effects with the medication.  She is happy with results of left knee replacement, considering having this done on the right side.  She also  continues to have pain in her shoulders, follow with Dr. Burnetta sports medicine.  Dr. Burnetta was considering MRI left shoulder if she continues to have difficulty with this area.  Patient feels like she will eventually need surgery in her left shoulder also, but at this time is feeling that she would rather have her right knee replaced before doing the shoulder surgery.  Patient reports tizanidine  has been helping her pain, this also helps her to use less oxycodone .  She usually takes about 4 mg at night, sometimes 2 mg during the day.   Pain Inventory Average Pain 6 Pain Right Now 4 My pain is sharp, burning, aching  In the last 24 hours, has pain interfered with the following? General activity 5 Relation with others 6 Enjoyment of life 5 What TIME of day is your pain at its worst? morning Sleep (in general) Good  Pain is worse with: Bending, walking, standing, some activities Pain improves with: rest, heat/ice, and medication, pacing activities, injections Relief from Meds: 8  Family History  Problem Relation Age of Onset   Pneumonia Mother        Deceased   Arthritis Mother    Asthma Mother    Cancer Mother        pancreatic   COPD Mother    Depression Mother    Diabetes Mother    Kidney disease Mother    Liver cancer Father        Living   Arthritis Father    Cancer Father        liver, prostate   COPD Father    Depression Father    Stroke Father    Heart disease Maternal Grandmother    Mental illness Maternal Grandmother    Breast cancer Maternal Grandmother    Alcohol abuse Paternal Grandfather    Breast cancer Paternal Aunt    Anesthesia problems Neg Hx    Hypotension Neg Hx    Malignant hyperthermia Neg Hx    Pseudochol deficiency Neg Hx    Colon cancer Neg Hx    Social History   Socioeconomic History   Marital status: Married    Spouse name: Molly Berry   Number of children: Not on file   Years of education: 16   Highest education level: Not on  file  Occupational History  Occupation: Curator employed)    Employer: NOT EMPLOYED  Tobacco Use   Smoking status: Never   Smokeless tobacco: Never  Vaping Use   Vaping status: Never Used  Substance and Sexual Activity   Alcohol use: No    Alcohol/week: 0.0 standard drinks of alcohol   Drug use: No   Sexual activity: Yes    Birth control/protection: None, Surgical  Other Topics Concern   Not on file  Social History Narrative   Lives with husband and 2 sons 31 and 15. (adopted)    Wants to work but says she is not dependable.  She does do volunteer work.    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  Working on disability.     Education: bachelors degree.   Disabled because of fibromyalgia and depression since 2010   Social Drivers of Health   Financial Resource Strain: Low Risk  (05/26/2023)   Received from [facility]   Overall Financial Resource Strain (CARDIA)    Difficulty of Paying Living Expenses: Not hard at all  Food Insecurity: No Food Insecurity (05/26/2023)   Received from [facility]   Hunger Vital Sign    Within the past 12 months, you worried that your food would run out before you got the money to buy more.: Never true    Within the past 12 months, the food you bought just didn't last and you didn't have money to get more.: Never true  Transportation Needs: No Transportation Needs (05/26/2023)   Received from [facility] - Transportation    Lack of Transportation (Medical): No    Lack of Transportation (Non-Medical): No  Physical Activity: Inactive (03/19/2021)   Received from [facility]   Exercise Vital Sign    On average, how many days per week do you engage in moderate to strenuous exercise (like a brisk walk)?: 0 days    On average, how many minutes do you engage in exercise at this level?: 0 min  Stress: Stress Concern Present (03/19/2021)   Received from [facility] - Occupational  Stress Questionnaire    Feeling of Stress : Very much  Social Connections: Unknown (09/15/2021)   Received from [facility]   Social Network    Social Network: Not on file   Past Surgical History:  Procedure Laterality Date   ABDOMINAL HYSTERECTOMY     endometrial cancer   CHOLECYSTECTOMY     COLONOSCOPY WITH PROPOFOL  N/A 01/19/2014   MFM:[email or web address]   CYSTOSCOPY W/ URETERAL STENT PLACEMENT Bilateral 02/07/2022   Procedure: CYSTOSCOPY WITH RETROGRADE PYELOGRAM/RIGHT URETERAL STENT PLACEMENT WITH POSSIBLE LEFT;  Surgeon: Selma Donnice SAUNDERS, MD;  Location: [facility];  Service: Urology;  Laterality: Bilateral;   CYSTOSCOPY WITH RETROGRADE PYELOGRAM, URETEROSCOPY AND STENT PLACEMENT Bilateral 03/06/2022   Procedure: CYSTOSCOPY WITH RETROGRADE PYELOGRAM, URETEROSCOPY AND STENT EXCHANGE;  Surgeon: Sherrilee Belvie CROME, MD;  Location: [facility];  Service: Urology;  Laterality: Bilateral;   DILATION AND CURETTAGE OF UTERUS  12 yrs ago   DILATION AND CURETTAGE OF UTERUS  06/18/2011   Procedure: DILATATION AND CURETTAGE;  Surgeon: Vonn VEAR Inch, MD;  Location: [facility];  Service: Gynecology;  Laterality: N/A;  Suction Dilation and Curettage   ESOPHAGOGASTRODUODENOSCOPY N/A 10/12/2013   Dr. Rourk:anastomotic ulcer likely cause of bleeding. likely ischemic    ESOPHAGOGASTRODUODENOSCOPY (EGD) WITH PROPOFOL  N/A 01/19/2014   MFM:[email or web address]   EXTRACORPOREAL SHOCK WAVE LITHOTRIPSY Left 01/11/2018   Procedure: LEFT EXTRACORPOREAL SHOCK  WAVE LITHOTRIPSY (ESWL);  Surgeon: Carolee Sherwood JONETTA DOUGLAS, MD;  Location: [facility];  Service: Urology;  Laterality: Left;   EXTRACORPOREAL SHOCK WAVE LITHOTRIPSY Left 01/14/2018   Procedure: LEFT EXTRACORPOREAL SHOCK WAVE LITHOTRIPSY (ESWL);  Surgeon: Sherrilee Belvie CROME, MD;  Location: [facility];  Service: Urology;  Laterality: Left;  75 MINS  W/ MAC   GASTRIC BYPASS  2013   [facility]   HOLMIUM LASER APPLICATION Bilateral 03/06/2022   Procedure: HOLMIUM LASER APPLICATION;  Surgeon: Sherrilee Belvie CROME, MD;  Location: [facility];  Service: Urology;  Laterality: Bilateral;   HYSTEROSCOPY WITH D & C  06/18/2011   Procedure: DILATATION AND CURETTAGE /HYSTEROSCOPY;  Surgeon: Vonn VEAR Inch, MD;  Location: [facility];  Service: Gynecology;  Laterality: N/A;   LITHOTRIPSY     paniculectomy     SHOULDER ARTHROSCOPY WITH SUBACROMIAL DECOMPRESSION AND OPEN ROTATOR C Left 07/16/2021   Procedure: LEFT ARTHROSCOPIC SUBACROMIAL DECOMPRESSION, MINI OPEN ROTATOR CUFF TEAR REPAIR, BICEPS TENOTOMY;  Surgeon: Anderson Maude ORN, MD;  [facility];  Service: Orthopedics;  Laterality: Left;   STONE EXTRACTION WITH BASKET Bilateral 03/06/2022   Procedure: STONE EXTRACTION WITH BASKET;  Surgeon: Sherrilee Belvie CROME, MD;  [facility]: [facility];  Service: Urology;  Laterality: Bilateral;   TOTAL KNEE ARTHROPLASTY Left 04/17/2023   Procedure: LEFT TOTAL KNEE ARTHROPLASTY;  Surgeon: Vernetta Lonni GRADE, MD;  Location: [facility];  Service: Orthopedics;  Laterality: Left;   TRIGGER FINGER RELEASE     WISDOM TOOTH EXTRACTION     Past Surgical History:  Procedure Laterality Date   ABDOMINAL HYSTERECTOMY     endometrial cancer   CHOLECYSTECTOMY     COLONOSCOPY WITH PROPOFOL  N/A 01/19/2014   MFM:[email or web address]   CYSTOSCOPY W/ URETERAL STENT PLACEMENT Bilateral 02/07/2022   Procedure: CYSTOSCOPY WITH RETROGRADE PYELOGRAM/RIGHT URETERAL STENT PLACEMENT WITH POSSIBLE LEFT;  Surgeon: Selma Donnice SAUNDERS, MD;  [facility];  Service: Urology;  Laterality: Bilateral;   CYSTOSCOPY WITH RETROGRADE PYELOGRAM, URETEROSCOPY AND STENT PLACEMENT Bilateral 03/06/2022   Procedure: CYSTOSCOPY WITH RETROGRADE PYELOGRAM, URETEROSCOPY AND STENT EXCHANGE;  Surgeon: Sherrilee Belvie CROME, MD;  [facility];  Service: Urology;  Laterality: Bilateral;   DILATION AND CURETTAGE OF UTERUS  12 yrs ago   DILATION AND CURETTAGE OF UTERUS  06/18/2011   Procedure: DILATATION AND CURETTAGE;  Surgeon: Vonn VEAR Inch, MD;  Location: [facility];  Service:  Gynecology;  Laterality: N/A;  Suction Dilation and Curettage   ESOPHAGOGASTRODUODENOSCOPY N/A 10/12/2013   Dr. Rourk:anastomotic ulcer likely cause of bleeding. likely ischemic    ESOPHAGOGASTRODUODENOSCOPY (EGD) WITH PROPOFOL  N/A 01/19/2014   MFM:[email or web address]   EXTRACORPOREAL SHOCK WAVE LITHOTRIPSY Left 01/11/2018   Procedure: LEFT EXTRACORPOREAL SHOCK WAVE LITHOTRIPSY (ESWL);  Surgeon: Carolee Sherwood JONETTA DOUGLAS, MD;  [facility];  Service: Urology;  Laterality: Left;   EXTRACORPOREAL SHOCK WAVE LITHOTRIPSY Left 01/14/2018   Procedure: LEFT EXTRACORPOREAL SHOCK WAVE LITHOTRIPSY (ESWL);  Surgeon: Sherrilee Belvie CROME, MD;  [facility];  Service: Urology;  Laterality: Left;  75 MINS  W/ MAC   GASTRIC BYPASS  2013   [facility]   HOLMIUM LASER APPLICATION Bilateral 03/06/2022   Procedure: HOLMIUM LASER APPLICATION;  Surgeon: Sherrilee Belvie CROME, MD;  Location: [facility];  Service: Urology;  Laterality: Bilateral;   HYSTEROSCOPY WITH D & C  06/18/2011   Procedure: DILATATION AND CURETTAGE /HYSTEROSCOPY;  Surgeon: Vonn VEAR Inch, MD;  [facility]: [facility];  Service: Gynecology;  Laterality: N/A;   LITHOTRIPSY     paniculectomy  SHOULDER ARTHROSCOPY WITH SUBACROMIAL DECOMPRESSION AND OPEN ROTATOR C Left 07/16/2021   Procedure: LEFT ARTHROSCOPIC SUBACROMIAL DECOMPRESSION, MINI OPEN ROTATOR CUFF TEAR REPAIR, BICEPS TENOTOMY;  Surgeon: Anderson Maude ORN, MD;  [facility];  Service: Orthopedics;  Laterality: Left;   STONE EXTRACTION WITH BASKET Bilateral 03/06/2022   Procedure: STONE EXTRACTION WITH BASKET;  Surgeon: Sherrilee Belvie CROME, MD;  [facility];  Service: Urology;  Laterality: Bilateral;   TOTAL KNEE ARTHROPLASTY Left 04/17/2023   Procedure: LEFT TOTAL KNEE ARTHROPLASTY;  Surgeon: Vernetta Lonni GRADE, MD;  [facility];  Service: Orthopedics;  Laterality: Left;   TRIGGER FINGER RELEASE     WISDOM TOOTH EXTRACTION     Past Medical History:  Diagnosis Date   Allergy    Anemia     Anxiety    Arthritis    knees, multiple joints   Asthma    Bell palsy    Blood transfusion without reported diagnosis    Breast mass    lt breast mass x's 3 years increased in size   Chronic kidney disease    Chronic pain syndrome    COPD (chronic obstructive pulmonary disease) (HCC)    Depression    Diabetes mellitus    Endometrial cancer (HCC)    Fibromyalgia    GERD (gastroesophageal reflux disease)    H/O gastric bypass 05/09/2014   At [facility]   Headache    mirgraines   Heart murmur    Hyperlipidemia    Hypertension    Hypothyroidism    Multiple pulmonary nodules 11/30/2012   Followed in Pulmonary clinic/ [facility]/ [place]  - See CT abd  10/29/12  New right lower lobe pulmonary nodularity, primarily ground-  glass in density. This could reflect an inflammatory process,  although follow-up is necessary to exclude atypical neoplasm. Full  chest CT should be considered to evaluate for other pulmonary  findings.      Multiple thyroid  nodules    Nephropathy    Pneumonia    PONV (postoperative nausea and vomiting)    Pulmonary nodules    Thyroid  disease    Ulcer    Uterine cancer (HCC) 12/26/2011   Stage 1, grade 1, S/P hysterectomy initially by robotic technique and then salping[facility]oophorectomy at [facility] receiving no postoperative treatment.   BP 114/78 (BP Location: Right Arm, Patient Position: Sitting, Cuff Size: Large) Comment (BP Location): patient preference due to lab work prior to office visit  Pulse 63   Ht 5' 6 (1.676 m)   Wt 233 lb 12.8 oz (106.1 kg)   LMP 05/06/2011   SpO2 97%   BMI 37.74 kg/m   Opioid Risk Score:   Fall Risk Score:  `1  Depression screen [facility] 2/9     12/15/2023   11:21 AM 10/15/2023   11:41 AM 08/11/2023   11:18 AM 04/07/2023   10:25 AM 03/02/2023   10:20 AM 01/02/2023    1:45 PM 09/15/2022   11:06 AM  Depression screen PHQ 2/9  Decreased Interest 2 0 1 1 0 1 2  Down, Depressed, Hopeless 2 0 1 1 0 1 2  PHQ - 2 Score 4 0 2 2 0 2 4   Altered sleeping 2        Tired, decreased energy 2        Change in appetite 2        Feeling bad or failure about yourself  1        Trouble concentrating 0  Moving slowly or fidgety/restless 0        Suicidal thoughts 0        PHQ-9 Score 11        Difficult doing work/chores Very difficult            Review of Systems  Musculoskeletal:  Positive for back pain, gait problem and neck pain.       B/L hand, knees, shoulder and B/L foot pain  All other systems reviewed and are negative.      Objective:   Physical Exam   Gen: no distress, normal appearing HEENT: oral mucosa pink and moist, NCAT Chest: normal effort, normal rate of breathing Abd: soft, non-distended Ext: no edema Psych:  pleasant and appropriate  Skin: intact Neuro: Alert and awake, follows commands, cranial nerves II through XII grossly intact DTR normal and symmetric Moving all 4 extremities to gravity and resistance Musculoskeletal:  TTP anterior left shoulder and pain with ROM Tenderness to palpation lumbar spine paraspinal muscles bilaterally Mild tenderness to palpation joint line right knee   Mild diffuse tenderness throughout her upper and lower extremities to palpation-not checked today     Xray L knee result 03/10/22 Three-view radiographs of the left knee were obtained today.  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  She has periarticular  osteophytes no acute fractures are noted    Xray L knee result 12/02/21  Radiographs of her right knee in 3 projections were reviewed today.  She  has tricompartmental arthritis with most changes in the medial compartment  with near bone-on-bone.  Also patellofemoral sclerosis and osteophyte  formation no acute fractures         Assessment & Plan:   Knee OA bilateral -Could consider genicular  nerve block if she does not have surgery -S/p L TKA by Dr. Vernetta  04/17/23 -ORT low  -Continue UDS and pill counts.  Continue PDMP monitoring.  Pain contract completed prior visit. -Discussed bringing pill bottle with any medications even if empty to all appointments  -Decrease to Percocet 7.5 every 6 hours as needed, #120 ordered -Continue tizanidine  as needed -Continue gabapentin  1200 mg twice daily -Patient considering right knee replacement   B/L shoulder pain left greater then right -Followed by ortho Dr. Burnetta- has ordered PT, considering MRI -Continue medications as above  Fibromyalgia  -Continue tizanidine  as needed, will order tizanidine  2 mg tabs.  Pt to use 2 to 4 mg at night as needed and 2 mg during the day as needed.  #90 ordered.  Discussed possible side effects of this medication.  Discussed that tizanidine  could potentially decrease blood pressure, if she develops dizziness when standing, low blood pressure she should stop the medication and we could consider trying something different such as Robaxin . -Continue gabapentin  1200 mg twice daily -Aquatic therapy limited by transportation -Zynex Nexwave device, Zynex Cryoheat blanket ordered prior visit -We discussed trying to do self exercises at her local pool.  - Recommend tai chi -She does have frequent stiffness in her joints , could consider rheumatology consult to rule out other rheumatological causes.   Right back and flank pain with Hx of Kidney stones -Improved, continue follow-up with urology as directed

## 2023-12-20 LAB — DRUG TOX MONITOR 1 W/CONF, ORAL FLD
Alprazolam: 3.04 ng/mL — ABNORMAL HIGH (ref <0.50)
Aminoclonazepam: NEGATIVE ng/mL (ref <0.50)
Amphetamines: NEGATIVE ng/mL (ref <10)
Barbiturates: NEGATIVE ng/mL (ref <10)
Benzodiazepines: POSITIVE ng/mL — AB (ref <0.50)
Buprenorphine: NEGATIVE ng/mL (ref <0.10)
Chlordiazepoxide: NEGATIVE ng/mL (ref <0.50)
Clonazepam: NEGATIVE ng/mL (ref <0.50)
Cocaine: NEGATIVE ng/mL (ref <5.0)
Codeine: NEGATIVE ng/mL (ref <2.5)
Diazepam: NEGATIVE ng/mL (ref <0.50)
Dihydrocodeine: NEGATIVE ng/mL (ref <2.5)
Fentanyl: NEGATIVE ng/mL (ref <0.10)
Flunitrazepam: NEGATIVE ng/mL (ref <0.50)
Flurazepam: NEGATIVE ng/mL (ref <0.50)
Heroin Metabolite: NEGATIVE ng/mL (ref <1.0)
Hydrocodone: NEGATIVE ng/mL (ref <2.5)
Hydromorphone: NEGATIVE ng/mL (ref <2.5)
Lorazepam: NEGATIVE ng/mL (ref <0.50)
MARIJUANA: NEGATIVE ng/mL (ref <2.5)
MDMA: NEGATIVE ng/mL (ref <10)
Meprobamate: NEGATIVE ng/mL (ref <2.5)
Methadone: NEGATIVE ng/mL (ref <5.0)
Midazolam: NEGATIVE ng/mL (ref <0.50)
Morphine: NEGATIVE ng/mL (ref <2.5)
Nicotine Metabolite: NEGATIVE ng/mL (ref <5.0)
Nordiazepam: NEGATIVE ng/mL (ref <0.50)
Norhydrocodone: NEGATIVE ng/mL (ref <2.5)
Noroxycodone: 44.9 ng/mL — ABNORMAL HIGH (ref <2.5)
Opiates: POSITIVE ng/mL — AB (ref <2.5)
Oxazepam: NEGATIVE ng/mL (ref <0.50)
Oxycodone: 48.4 ng/mL — ABNORMAL HIGH (ref <2.5)
Oxymorphone: NEGATIVE ng/mL (ref <2.5)
Phencyclidine: NEGATIVE ng/mL (ref <10)
Tapentadol: NEGATIVE ng/mL (ref <5.0)
Temazepam: NEGATIVE ng/mL (ref <0.50)
Tramadol: NEGATIVE ng/mL (ref <5.0)
Triazolam: NEGATIVE ng/mL (ref <0.50)
Zolpidem: NEGATIVE ng/mL (ref <5.0)

## 2023-12-20 LAB — DRUG TOX ALC METAB W/CON, ORAL FLD: Alcohol Metabolite: NEGATIVE ng/mL (ref <25)

## 2024-01-12 ENCOUNTER — Other Ambulatory Visit (HOSPITAL_COMMUNITY)
Admission: RE | Admit: 2024-01-12 | Discharge: 2024-01-12 | Disposition: A | Discharge status: Home / Self Care | Source: Ambulatory Visit | Attending: Ophthalmology | Admitting: Ophthalmology

## 2024-01-12 DIAGNOSIS — M316 Other giant cell arteritis: Secondary | ICD-10-CM | POA: Diagnosis present

## 2024-01-12 LAB — CBC WITH DIFFERENTIAL/PLATELET
Abs Immature Granulocytes: 0.02 K/uL (ref 0.00–0.07)
Basophils Absolute: 0.1 K/uL (ref 0.0–0.1)
Basophils Relative: 1 %
Eosinophils Absolute: 0.2 K/uL (ref 0.0–0.5)
Eosinophils Relative: 4 %
HCT: 41.1 % (ref 36.0–46.0)
Hemoglobin: 13 g/dL (ref 12.0–15.0)
Immature Granulocytes: 0 %
Lymphocytes Relative: 27 %
Lymphs Abs: 1.6 K/uL (ref 0.7–4.0)
MCH: 29.4 pg (ref 26.0–34.0)
MCHC: 31.6 g/dL (ref 30.0–36.0)
MCV: 93 fL (ref 80.0–100.0)
Monocytes Absolute: 0.5 K/uL (ref 0.1–1.0)
Monocytes Relative: 9 %
Neutro Abs: 3.5 K/uL (ref 1.7–7.7)
Neutrophils Relative %: 59 %
Platelets: 389 K/uL (ref 150–400)
RBC: 4.42 MIL/uL (ref 3.87–5.11)
RDW: 13.1 % (ref 11.5–15.5)
WBC: 5.9 K/uL (ref 4.0–10.5)
nRBC: 0 % (ref 0.0–0.2)

## 2024-01-12 LAB — SEDIMENTATION RATE: Sed Rate: 12 mm/h (ref 0–22)

## 2024-01-12 LAB — C-REACTIVE PROTEIN: CRP: 0.7 mg/dL (ref <1.0)

## 2024-01-19 ENCOUNTER — Encounter (HOSPITAL_COMMUNITY): Payer: Self-pay | Admitting: Hematology and Oncology

## 2024-01-25 ENCOUNTER — Ambulatory Visit (HOSPITAL_COMMUNITY)

## 2024-01-27 ENCOUNTER — Ambulatory Visit: Payer: Commercial Managed Care - HMO | Admitting: Urology

## 2024-02-04 ENCOUNTER — Other Ambulatory Visit: Payer: Self-pay | Admitting: Physical Medicine & Rehabilitation

## 2024-02-15 ENCOUNTER — Encounter: Admitting: Physical Medicine & Rehabilitation

## 2024-02-16 ENCOUNTER — Other Ambulatory Visit (INDEPENDENT_AMBULATORY_CARE_PROVIDER_SITE_OTHER): Payer: Self-pay | Admitting: Gastroenterology

## 2024-02-17 ENCOUNTER — Encounter (INDEPENDENT_AMBULATORY_CARE_PROVIDER_SITE_OTHER): Payer: Self-pay | Admitting: Gastroenterology

## 2024-02-29 ENCOUNTER — Encounter: Attending: Physical Medicine & Rehabilitation | Admitting: Physical Medicine & Rehabilitation

## 2024-02-29 ENCOUNTER — Encounter: Payer: Self-pay | Admitting: Physical Medicine & Rehabilitation

## 2024-02-29 VITALS — BP 124/85 | HR 78 | Ht 66.0 in | Wt 236.0 lb

## 2024-02-29 DIAGNOSIS — G8929 Other chronic pain: Secondary | ICD-10-CM | POA: Diagnosis present

## 2024-02-29 DIAGNOSIS — M545 Low back pain, unspecified: Secondary | ICD-10-CM | POA: Insufficient documentation

## 2024-02-29 DIAGNOSIS — Z79891 Long term (current) use of opiate analgesic: Secondary | ICD-10-CM | POA: Diagnosis not present

## 2024-02-29 DIAGNOSIS — M170 Bilateral primary osteoarthritis of knee: Secondary | ICD-10-CM | POA: Diagnosis present

## 2024-02-29 DIAGNOSIS — M797 Fibromyalgia: Secondary | ICD-10-CM | POA: Insufficient documentation

## 2024-02-29 DIAGNOSIS — G894 Chronic pain syndrome: Secondary | ICD-10-CM | POA: Insufficient documentation

## 2024-02-29 MED ORDER — OXYCODONE-ACETAMINOPHEN 7.5-325 MG PO TABS: 1.0000 | ORAL_TABLET | Freq: Four times a day (QID) | ORAL | 0 refills | Status: DC | PRN

## 2024-02-29 NOTE — Progress Notes (Signed)
 Subjective:    Patient ID: Molly Berry, female    DOB: Nov 22, 1965, 58 y.o.   MRN: [id number]  HPI  HPI 08/18/22 Molly Berry is a 58 y.o. year old female  who  has a past medical history of Allergy, Anemia, Anxiety, Arthritis, Asthma, Bell palsy, Blood transfusion without reported diagnosis, Breast mass, Chronic kidney disease, Chronic pain syndrome, COPD (chronic obstructive pulmonary disease), Depression, Diabetes mellitus, Endometrial cancer, Fibromyalgia, GERD (gastroesophageal reflux disease), H/O gastric bypass (05/09/2014), Headache, Hyperlipidemia, Hypertension, Hypothyroidism, Multiple pulmonary nodules (11/30/2012), Multiple thyroid  nodules, Nephropathy, Pneumonia, PONV (postoperative nausea and vomiting), Pulmonary nodules, Thyroid  disease, Ulcer, and Uterine cancer (12/26/2011).   They are presenting to PM&R clinic as a new patient for pain management evaluation.  [name] reports that most of her pain is related to her history of fibromyalgia and osteoarthritis particular of her knees.  She has widespread pain throughout her whole body from fibromyalgia.  She has had knee pain for several years and her knees are her worst area of pain. It was initially worse in her right knee, then her left knee became more severe.  She reports she had cortisone injections with slight benefit however she cannot get gel injections approved by insurance.  She would like to have knee surgery and is followed by orthopedics most recently seen by Ronal Bach, PA.  She is working on weight loss in order to be able to qualify for surgery.  She was previously seen by [facility] pain management.  Her pain was previously managed with oxycodone  10 mg 3 times a day.  She reports she was with this practice for about 7 years.  She does report she was discharged after she took a tramadol by a family member gave her when she had had multiple procedures and surgeries for renal stones.  Since this time her PCP has been  prescribing her oxycodone  10 mg.     No red flags for back pain endorsed in Hx or ROS   Medications tried: Nsaids meloxicam  helps, rare use due to concern for kidneys Tylenol  - helps slightly  Opiates  oxycodone  10mg  helps, has used this for many years Gabapentin  1200mg  BID  Lyrica- reports reason she can't take it but doesn't remember what that was  Lamictal  for mood  Buprenorphine - bad feeling  Tramadol didn't help  TCAs  - didn't help SNRIs  cymbalta, did not tolerate it  Other   Tizanidine /robaxin  slightly helps    Other treatments: PT/OT  - helped in the past, she likes integrative therapy TENs unit - made her sore  Injections - knee cortisone   Interval history 09/15/2022 Patient also reports she has been doing well overall regarding her pain control.  Her pain has been severe in her right back and flank recently.  She reports follow-up with urology today in clinic, says she has had multiple issues with kidney stones.  She also had a UA ordered today.  She was started on Macrobid .  Prior to this her pain was much better controlled on Percocet 7.5 mg 4 times a day.  This was providing more consistent pain control than Percocet 10 mg 3 times a day.  Patient reports she got message from [facility]  however needs to call them back.   Interval history 09/15/2022 Patient also reports she has been doing well overall regarding her pain control.  Her pain has been severe in her right back and flank recently.  She reports follow-up with urology today in clinic,  says she has had multiple issues with kidney stones.  She also had a UA ordered today.  She was started on Macrobid .  Prior to this her pain was much better controlled on Percocet 7.5 mg 4 times a day.  This was providing more consistent pain control than Percocet 10 mg 3 times a day.  Patient reports she got message from [facility]  however needs to call them back.   Interval history 03/02/23 Molly Berry is here for follow-up regarding her  chronic pain.  She reports that pain continues to be poorly controlled.  Her worst pain is at her knees however she has pain all throughout her body.  Percocet 7.5 mg helps some days, other days her pain continues to be very severe.  No side effects with the medication.  She is planning on having knee replacement surgery with Dr. Vernetta.  Interval history 04/07/23 Patient here for follow-up regarding her chronic pain.  Percocet continues to help keep her pain more tolerable.  She does have left TKA scheduled with Dr. Vernetta on 04/17/2023.  She is a little concerned about postoperative pain.  She has been limited in her ability to start physical therapy because her truck is having mechanical issues.  Interval history 05/28/2023 Patient reports she is doing well after her knee replacement.  She was seen by Dr. Vernetta yesterday, note indicates that Dr. Vernetta would like her to follow-up with pain management for continued pain treatment.  She has been doing fairly well on Dilaudid  2 mg every 4 hours as needed.  Patient does report that she thinks this has been causing her GI upset.  She did not have the symptoms with oxycodone .  She has run out of Dilaudid  yesterday and restarted with Percocet 7.5 2 tabs every 4 hours.  She reports she tried 1 tab of Percocet 7.5 but she cannot tolerate the pain.  She is not having any side effects with Percocet.  She reports that Dr. Vernetta is overall happy with her progress.  She is considering having her other knee replaced as well.  Interval history 06/30/23 Patient reports her knee pain is doing better.  She is ready to decrease Percocet to 7.5 dose from the Percocet 10 dose.  She would also like to lower this for when she decides to have her other knee replaced.  She is not having any side effects with the medication.  She continues to have a lot of pain throughout her body.  She continues to be limited in her activity level.  We discussed trying tai chi, even  for about 5 minutes a day and potentially working up.  We also discussed trying aquatic therapy unfortunately she is not close to aquatic therapy center.  She might try joining the [facility] where they have a pool where she can do exercises on her own or potentially do water  aerobics there.   She had problems with prior Auth for the medication but was able to get the Percocet.  Interval history 08/11/22 Patient is here for follow-up up about her chronic pain.  Left knee pain is gradually improving, she is happy with the results overall.  Right knee continues to be very painful.  She has been gradually increasing her activity level.  She is able to ride a horse for about an hour, this is something she very much enjoys doing.  No side effects with the Percocet 7.5 mg, using 5-6 times a day.  She is going to try decrease frequency of use  to 4 times a day.  She is considering surgery for her right knee late April or early May potentially.   Interval History 10/15/23 She has been having pain in her shoulders. She is followed by orthopaedics, Dr. Burnetta.  R shoulder pain improved with cortisone injection with ortho, L shoulder continues to be painful. She is followed with Dr. Burnetta, possibly rotator cuff disorder. PT has been ordered. She is noted to have recent R bicep tendon rupture. He has ordered PT and considering MRI L shoulder.  Percocet doing well at current dose. She is taking about 5 tabs percocet per day and this is helping control her overall pain. No side effects with the medication.   R knee pain is doing OK. L knee has been a little more painful.    Interval History 10/15/23 Patient is here for follow-up of her chronic pain.  Pain has been doing better overall, not having to use Percocet as frequently.  Percocet continues to help her pain, no significant side effects with the medication.  She is happy with results of left knee replacement, considering having this done on the right side.  She also  continues to have pain in her shoulders, follow with Dr. Burnetta sports medicine.  Dr. Burnetta was considering MRI left shoulder if she continues to have difficulty with this area.  Patient feels like she will eventually need surgery in her left shoulder also, but at this time is feeling that she would rather have her right knee replaced before doing the shoulder surgery.  Patient reports tizanidine  has been helping her pain, this also helps her to use less oxycodone .  She usually takes about 4 mg at night, sometimes 2 mg during the day.  Interval History 02/29/24 Reports new-onset back pain for the last month. Describes the pain as screaming and severe enough to awaken from sleep. Denies any specific inciting injury or aggravating activity, though notes it can worsen with prolonged sitting for craft activities. Pain is localized to the mid-to-lower back, without radiation into the legs. It is intermittent and not present today. Denies tenderness to palpation even during painful episodes. States the pain feels like the back knots up. General pain is otherwise controlled on the current regimen. Reports a recent episode of severe, generalized pain lasting a few days, which resolved with the addition of tizanidine . Depression is reportedly improved following a medication adjustment by their psychiatrist. History of kidney stones noted, but current back pain is not consistent with prior episodes of renal colic. No associated UTI symptoms. Has a urology follow-up scheduled with Dr. Jacquline for a scan.  Past Surgical History/Future Plans - Recent cataract surgery on both eyes. Right eye is still bruised and experiences watering/flickering. - Plans for right knee surgery after the holidays. - Plans for shoulder surgery after the knee surgery. Reports a recent steroid injection in the right shoulder by Dr. Burnetta provided significant relief.  Medication Review - Gabapentin : continues to take, reports it  is effective. - Tizanidine : uses as needed for muscle cramping and recent back pain episode, not taken daily. Reports it is helpful. - : Oxycodone : reports it is effective for overall pain control.  Pain Inventory Average Pain 6 Pain Right Now 4 My pain is sharp, burning, aching  In the last 24 hours, has pain interfered with the following? General activity 5 Relation with others 6 Enjoyment of life 5 What TIME of day is your pain at its worst? morning Sleep (in general) Good  Pain  is worse with: Bending, walking, standing, some activities Pain improves with: rest, heat/ice, and medication, pacing activities, injections Relief from Meds: 8  Family History  Problem Relation Age of Onset   Pneumonia Mother        Deceased   Arthritis Mother    Asthma Mother    Cancer Mother        pancreatic   COPD Mother    Depression Mother    Diabetes Mother    Kidney disease Mother    Liver cancer Father        Living   Arthritis Father    Cancer Father        liver, prostate   COPD Father    Depression Father    Stroke Father    Heart disease Maternal Grandmother    Mental illness Maternal Grandmother    Breast cancer Maternal Grandmother    Alcohol abuse Paternal Grandfather    Breast cancer Paternal Aunt    Anesthesia problems Neg Hx    Hypotension Neg Hx    Malignant hyperthermia Neg Hx    Pseudochol deficiency Neg Hx    Colon cancer Neg Hx    Social History   Socioeconomic History   Marital status: Married    Spouse name: Lynwood Parker   Number of children: Not on file   Years of education: 16   Highest education level: Not on file  Occupational History   Occupation: curator employed)    Employer: NOT EMPLOYED  Tobacco Use   Smoking status: Never   Smokeless tobacco: Never  Vaping Use   Vaping status: Never Used  Substance and Sexual Activity   Alcohol use: No    Alcohol/week: 0.0 standard drinks of alcohol   Drug use: No   Sexual activity: Yes     Birth control/protection: None, Surgical  Other Topics Concern   Not on file  Social History Narrative   Lives with husband and 2 sons 38 and 15. (adopted)    Wants to work but says she is not dependable.  She does do volunteer work.    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  Working on disability.     Education: bachelors degree.   Disabled because of fibromyalgia and depression since 2010   Social Drivers of Health   Financial Resource Strain: Low Risk  (05/26/2023)   Received from [facility]   Overall Financial Resource Strain (CARDIA)    Difficulty of Paying Living Expenses: Not hard at all  Food Insecurity: No Food Insecurity (05/26/2023)   Received from [facility] [place]   Hunger Vital Sign    Within the past 12 months, you worried that your food would run out before you got the money to buy more.: Never true    Within the past 12 months, the food you bought just didn't last and you didn't have money to get more.: Never true  Transportation Needs: No Transportation Needs (05/26/2023)   Received from [facility] - Transportation    Lack of Transportation (Medical): No    Lack of Transportation (Non-Medical): No  Physical Activity: Inactive (03/19/2021)   Received from [facility]   Exercise Vital Sign    On average, how many days per week do you engage in moderate to strenuous exercise (like a brisk walk)?: 0 days    On average, how many minutes do you engage in exercise at this level?: 0 min  Stress: Stress Concern Present (03/19/2021)  Received from [facility] - Occupational Stress Questionnaire    Feeling of Stress : Very much  Social Connections: Unknown (09/15/2021)   Received from [facility]   Social Network    Social Network: Not on file   Past Surgical History:  Procedure Laterality Date   ABDOMINAL HYSTERECTOMY     endometrial cancer   CHOLECYSTECTOMY     COLONOSCOPY WITH PROPOFOL  N/A 01/19/2014    MFM:[email or web address]   CYSTOSCOPY W/ URETERAL STENT PLACEMENT Bilateral 02/07/2022   Procedure: CYSTOSCOPY WITH RETROGRADE PYELOGRAM/RIGHT URETERAL STENT PLACEMENT WITH POSSIBLE LEFT;  Surgeon: Selma Donnice SAUNDERS, MD;  Location: [facility];  Service: Urology;  Laterality: Bilateral;   CYSTOSCOPY WITH RETROGRADE PYELOGRAM, URETEROSCOPY AND STENT PLACEMENT Bilateral 03/06/2022   Procedure: CYSTOSCOPY WITH RETROGRADE PYELOGRAM, URETEROSCOPY AND STENT EXCHANGE;  Surgeon: Sherrilee Belvie CROME, MD;  Location: [facility];  Service: Urology;  Laterality: Bilateral;   DILATION AND CURETTAGE OF UTERUS  12 yrs ago   DILATION AND CURETTAGE OF UTERUS  06/18/2011   Procedure: DILATATION AND CURETTAGE;  Surgeon: Vonn VEAR Inch, MD;  Location: [facility];  Service: Gynecology;  Laterality: N/A;  Suction Dilation and Curettage   ESOPHAGOGASTRODUODENOSCOPY N/A 10/12/2013   Dr. Rourk:anastomotic ulcer likely cause of bleeding. likely ischemic    ESOPHAGOGASTRODUODENOSCOPY (EGD) WITH PROPOFOL  N/A 01/19/2014   MFM:[email or web address]   EXTRACORPOREAL SHOCK WAVE LITHOTRIPSY Left 01/11/2018   Procedure: LEFT EXTRACORPOREAL SHOCK WAVE LITHOTRIPSY (ESWL);  Surgeon: Carolee Sherwood JONETTA DOUGLAS, MD;  Location: [facility];  Service: Urology;  Laterality: Left;   EXTRACORPOREAL SHOCK WAVE LITHOTRIPSY Left 01/14/2018   Procedure: LEFT EXTRACORPOREAL SHOCK WAVE LITHOTRIPSY (ESWL);  Surgeon: Sherrilee Belvie CROME, MD;  Location: [facility];  Service: Urology;  Laterality: Left;  75 MINS  W/ MAC   GASTRIC BYPASS  2013   [facility]   HOLMIUM LASER APPLICATION Bilateral 03/06/2022   Procedure: HOLMIUM LASER APPLICATION;  Surgeon: Sherrilee Belvie CROME, MD;  Location: [facility];  Service: Urology;  Laterality: Bilateral;   HYSTEROSCOPY WITH D & C  06/18/2011   Procedure: DILATATION AND CURETTAGE /HYSTEROSCOPY;  Surgeon: Vonn VEAR Inch, MD;  Location: [facility];  Service: Gynecology;  Laterality: N/A;   LITHOTRIPSY     paniculectomy     SHOULDER ARTHROSCOPY WITH SUBACROMIAL DECOMPRESSION AND  OPEN ROTATOR C Left 07/16/2021   Procedure: LEFT ARTHROSCOPIC SUBACROMIAL DECOMPRESSION, MINI OPEN ROTATOR CUFF TEAR REPAIR, BICEPS TENOTOMY;  Surgeon: Anderson Maude ORN, MD;  [facility];  Service: Orthopedics;  Laterality: Left;   STONE EXTRACTION WITH BASKET Bilateral 03/06/2022   Procedure: STONE EXTRACTION WITH BASKET;  Surgeon: Sherrilee Belvie CROME, MD;  [facility]: [facility];  Service: Urology;  Laterality: Bilateral;   TOTAL KNEE ARTHROPLASTY Left 04/17/2023   Procedure: LEFT TOTAL KNEE ARTHROPLASTY;  Surgeon: Vernetta Lonni GRADE, MD;  [facility];  Service: Orthopedics;  Laterality: Left;   TRIGGER FINGER RELEASE     WISDOM TOOTH EXTRACTION     Past Surgical History:  Procedure Laterality Date   ABDOMINAL HYSTERECTOMY     endometrial cancer   CHOLECYSTECTOMY     COLONOSCOPY WITH PROPOFOL  N/A 01/19/2014   MFM:[email or web address]   CYSTOSCOPY W/ URETERAL STENT PLACEMENT Bilateral 02/07/2022   Procedure: CYSTOSCOPY WITH RETROGRADE PYELOGRAM/RIGHT URETERAL STENT PLACEMENT WITH POSSIBLE LEFT;  Surgeon: Selma Donnice SAUNDERS, MD;  [facility];  Service: Urology;  Laterality: Bilateral;   CYSTOSCOPY WITH RETROGRADE PYELOGRAM, URETEROSCOPY AND STENT PLACEMENT Bilateral 03/06/2022   Procedure: CYSTOSCOPY WITH RETROGRADE PYELOGRAM, URETEROSCOPY AND  STENT EXCHANGE;  Surgeon: Sherrilee Belvie CROME, MD;  [facility];  Service: Urology;  Laterality: Bilateral;   DILATION AND CURETTAGE OF UTERUS  12 yrs ago   DILATION AND CURETTAGE OF UTERUS  06/18/2011   Procedure: DILATATION AND CURETTAGE;  Surgeon: Vonn VEAR Inch, MD;  [facility]: [facility];  Service: Gynecology;  Laterality: N/A;  Suction Dilation and Curettage   ESOPHAGOGASTRODUODENOSCOPY N/A 10/12/2013   Dr. Rourk:anastomotic ulcer likely cause of bleeding. likely ischemic    ESOPHAGOGASTRODUODENOSCOPY (EGD) WITH PROPOFOL  N/A 01/19/2014   MFM:[email or web address]   EXTRACORPOREAL SHOCK WAVE LITHOTRIPSY Left 01/11/2018   Procedure: LEFT EXTRACORPOREAL SHOCK  WAVE LITHOTRIPSY (ESWL);  Surgeon: Carolee Sherwood JONETTA DOUGLAS, MD;  [facility];  Service: Urology;  Laterality: Left;   EXTRACORPOREAL SHOCK WAVE LITHOTRIPSY Left 01/14/2018   Procedure: LEFT EXTRACORPOREAL SHOCK WAVE LITHOTRIPSY (ESWL);  Surgeon: Sherrilee Belvie CROME, MD;  [facility];  Service: Urology;  Laterality: Left;  75 MINS  W/ MAC   GASTRIC BYPASS  2013   [facility]   HOLMIUM LASER APPLICATION Bilateral 03/06/2022   Procedure: HOLMIUM LASER APPLICATION;  Surgeon: Sherrilee Belvie CROME, MD;  [facility]: [facility];  Service: Urology;  Laterality: Bilateral;   HYSTEROSCOPY WITH D & C  06/18/2011   Procedure: DILATATION AND CURETTAGE /HYSTEROSCOPY;  Surgeon: Vonn VEAR Inch, MD;  [facility]: [facility];  Service: Gynecology;  Laterality: N/A;   LITHOTRIPSY     paniculectomy     SHOULDER ARTHROSCOPY WITH SUBACROMIAL DECOMPRESSION AND OPEN ROTATOR C Left 07/16/2021   Procedure: LEFT ARTHROSCOPIC SUBACROMIAL DECOMPRESSION, MINI OPEN ROTATOR CUFF TEAR REPAIR, BICEPS TENOTOMY;  Surgeon: Anderson Maude ORN, MD;  [facility];  Service: Orthopedics;  Laterality: Left;   STONE EXTRACTION WITH BASKET Bilateral 03/06/2022   Procedure: STONE EXTRACTION WITH BASKET;  Surgeon: Sherrilee Belvie CROME, MD;  [facility];  Service: Urology;  Laterality: Bilateral;   TOTAL KNEE ARTHROPLASTY Left 04/17/2023   Procedure: LEFT TOTAL KNEE ARTHROPLASTY;  Surgeon: Vernetta Lonni GRADE, MD;  [facility];  Service: Orthopedics;  Laterality: Left;   TRIGGER FINGER RELEASE     WISDOM TOOTH EXTRACTION     Past Medical History:  Diagnosis Date   Allergy    Anemia    Anxiety    Arthritis    knees, multiple joints   Asthma    Bell palsy    Blood transfusion without reported diagnosis    Breast mass    lt breast mass x's 3 years increased in size   Chronic kidney disease    Chronic pain syndrome    COPD (chronic obstructive pulmonary disease) (HCC)    Depression    Diabetes mellitus    Endometrial cancer (HCC)     Fibromyalgia    GERD (gastroesophageal reflux disease)    H/O gastric bypass 05/09/2014   At [facility]   Headache    mirgraines   Heart murmur    Hyperlipidemia    Hypertension    Hypothyroidism    Multiple pulmonary nodules 11/30/2012   Followed in Pulmonary clinic/ [facility]/ [place]  - See CT abd  10/29/12  New right lower lobe pulmonary nodularity, primarily ground-  glass in density. This could reflect an inflammatory process,  although follow-up is necessary to exclude atypical neoplasm. Full  chest CT should be considered to evaluate for other pulmonary  findings.      Multiple thyroid  nodules    Nephropathy    Pneumonia    PONV (postoperative nausea and vomiting)  Pulmonary nodules    Thyroid  disease    Ulcer    Uterine cancer (HCC) 12/26/2011   Stage 1, grade 1, S/P hysterectomy initially by robotic technique and then salpingo[facility]oophorectomy at [facility] receiving no postoperative treatment.   LMP 05/06/2011   Opioid Risk Score:   Fall Risk Score:  `1  Depression screen [facility] 2/9     12/15/2023   11:21 AM 10/15/2023   11:41 AM 08/11/2023   11:18 AM 04/07/2023   10:25 AM 03/02/2023   10:20 AM 01/02/2023    1:45 PM 09/15/2022   11:06 AM  Depression screen PHQ 2/9  Decreased Interest 2 0 1 1 0 1 2  Down, Depressed, Hopeless 2 0 1 1 0 1 2  PHQ - 2 Score 4 0 2 2 0 2 4  Altered sleeping 2        Tired, decreased energy 2        Change in appetite 2        Feeling bad or failure about yourself  1        Trouble concentrating 0        Moving slowly or fidgety/restless 0        Suicidal thoughts 0        PHQ-9 Score 11        Difficult doing work/chores Very difficult            Review of Systems  Musculoskeletal:  Positive for back pain, gait problem and neck pain.       B/L hand, knees, shoulder and B/L foot pain  All other systems reviewed and are negative.      Objective:   Physical Exam   Gen: no distress, normal appearing HEENT: oral mucosa pink and  moist, NCAT Chest: normal effort, normal rate of breathing Abd: soft, non-distended Ext: no edema Psych:  pleasant and appropriate  Skin: intact Neuro: Alert and awake, follows commands, cranial nerves II through XII grossly intact  Moving all 4 extremities to gravity and resistance Musculoskeletal:  Minimal tenderness mid-to-lower back. Facet loading negative. Positive for pain with spinal extension.   Mild diffuse tenderness throughout her upper and lower extremities to palpation    Xray L knee result 03/10/22 Three-view radiographs of the left knee were obtained today.  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  She has periarticular  osteophytes no acute fractures are noted    Xray L knee result 12/02/21  Radiographs of her right knee in 3 projections were reviewed today.  She  has tricompartmental arthritis with most changes in the medial compartment  with near bone-on-bone.  Also patellofemoral sclerosis and osteophyte  formation no acute fractures         Assessment & Plan:   Knee OA bilateral -Could consider genicular nerve block if she does not have surgery -S/p L TKA by Dr. Vernetta  04/17/23 -ORT low  -Continue UDS and pill counts.  Continue PDMP monitoring.  Pain contract completed prior visit. -Discussed bringing pill bottle with any medications even if empty to all appointments  -Continue Percocet 7.5 every 6 hours as needed, #120 ordered -Continue tizanidine  as needed -Continue gabapentin  1200 mg twice daily -Patient considering right knee replacement soon  B/L shoulder pain left greater then right -Followed by ortho Dr. Burnetta, Recent MRI of shoulder, surgery may be considered at later time -Continue medications  as above  Fibromyalgia  -Continue tizanidine  as needed, will order tizanidine  2 mg tabs.  Pt to use  2 to 4 mg at night as needed and 2 mg during the day as needed.  #90 ordered.  Discussed possible side effects of this medication.  Discussed that tizanidine  could potentially decrease blood pressure, if she develops dizziness when standing, low blood pressure she should stop the medication and we could consider trying something different such as Robaxin . -Continue gabapentin  1200 mg twice daily -Aquatic therapy limited by transportation -Zynex Nexwave device, Zynex Cryoheat blanket ordered prior visit - Recommend tai chi -She does have frequent stiffness in her joints , could consider rheumatology consult to rule out other rheumatological causes.   Right back and flank pain with Hx of Kidney stones -Improved, continue follow-up with urology as directed -Xray L spine ordered

## 2024-03-07 ENCOUNTER — Encounter: Payer: Self-pay | Admitting: Radiology

## 2024-03-08 ENCOUNTER — Emergency Department (HOSPITAL_COMMUNITY)
Admission: EM | Admit: 2024-03-08 | Discharge: 2024-03-09 | Disposition: A | Discharge status: Home / Self Care | Attending: Emergency Medicine | Admitting: Emergency Medicine

## 2024-03-08 ENCOUNTER — Other Ambulatory Visit: Payer: Self-pay

## 2024-03-08 ENCOUNTER — Encounter (HOSPITAL_COMMUNITY): Payer: Self-pay | Admitting: Emergency Medicine

## 2024-03-08 ENCOUNTER — Emergency Department (HOSPITAL_COMMUNITY)

## 2024-03-08 DIAGNOSIS — N189 Chronic kidney disease, unspecified: Secondary | ICD-10-CM | POA: Insufficient documentation

## 2024-03-08 DIAGNOSIS — E039 Hypothyroidism, unspecified: Secondary | ICD-10-CM | POA: Diagnosis not present

## 2024-03-08 DIAGNOSIS — I129 Hypertensive chronic kidney disease with stage 1 through stage 4 chronic kidney disease, or unspecified chronic kidney disease: Secondary | ICD-10-CM | POA: Insufficient documentation

## 2024-03-08 DIAGNOSIS — R1031 Right lower quadrant pain: Secondary | ICD-10-CM | POA: Insufficient documentation

## 2024-03-08 DIAGNOSIS — Z96652 Presence of left artificial knee joint: Secondary | ICD-10-CM | POA: Diagnosis not present

## 2024-03-08 DIAGNOSIS — J449 Chronic obstructive pulmonary disease, unspecified: Secondary | ICD-10-CM | POA: Diagnosis not present

## 2024-03-08 DIAGNOSIS — E1122 Type 2 diabetes mellitus with diabetic chronic kidney disease: Secondary | ICD-10-CM | POA: Insufficient documentation

## 2024-03-08 DIAGNOSIS — Z8542 Personal history of malignant neoplasm of other parts of uterus: Secondary | ICD-10-CM | POA: Insufficient documentation

## 2024-03-08 LAB — URINALYSIS, ROUTINE W REFLEX MICROSCOPIC
Bilirubin Urine: NEGATIVE
Glucose, UA: NEGATIVE mg/dL
Hgb urine dipstick: NEGATIVE
Ketones, ur: NEGATIVE mg/dL
Nitrite: NEGATIVE
Protein, ur: NEGATIVE mg/dL
Specific Gravity, Urine: 1.019 (ref 1.005–1.030)
pH: 5 (ref 5.0–8.0)

## 2024-03-08 LAB — COMPREHENSIVE METABOLIC PANEL WITH GFR
ALT: 9 U/L (ref 0–44)
AST: 19 U/L (ref 15–41)
Albumin: 4.2 g/dL (ref 3.5–5.0)
Alkaline Phosphatase: 104 U/L (ref 38–126)
Anion gap: 11 (ref 5–15)
BUN: 16 mg/dL (ref 6–20)
CO2: 28 mmol/L (ref 22–32)
Calcium: 9.2 mg/dL (ref 8.9–10.3)
Chloride: 104 mmol/L (ref 98–111)
Creatinine, Ser: 1.34 mg/dL — ABNORMAL HIGH (ref 0.44–1.00)
GFR, Estimated: 46 mL/min — ABNORMAL LOW (ref >60)
Glucose, Bld: 92 mg/dL (ref 70–99)
Potassium: 4 mmol/L (ref 3.5–5.1)
Sodium: 143 mmol/L (ref 135–145)
Total Bilirubin: 0.3 mg/dL (ref 0.0–1.2)
Total Protein: 6.9 g/dL (ref 6.5–8.1)

## 2024-03-08 LAB — CBC
HCT: 39.8 % (ref 36.0–46.0)
Hemoglobin: 12.4 g/dL (ref 12.0–15.0)
MCH: 29.5 pg (ref 26.0–34.0)
MCHC: 31.2 g/dL (ref 30.0–36.0)
MCV: 94.8 fL (ref 80.0–100.0)
Platelets: 324 K/uL (ref 150–400)
RBC: 4.2 MIL/uL (ref 3.87–5.11)
RDW: 12.5 % (ref 11.5–15.5)
WBC: 6.5 K/uL (ref 4.0–10.5)
nRBC: 0 % (ref 0.0–0.2)

## 2024-03-08 LAB — LIPASE, BLOOD: Lipase: 20 U/L (ref 11–51)

## 2024-03-08 MED ORDER — SODIUM CHLORIDE 0.9 % IV BOLUS
1000.0000 mL | Freq: Once | INTRAVENOUS | Status: AC
  Administered 2024-03-08: 1000 mL via INTRAVENOUS

## 2024-03-08 MED ORDER — HYDROMORPHONE HCL 1 MG/ML IJ SOLN
1.0000 mg | Freq: Once | INTRAMUSCULAR | Status: AC
  Administered 2024-03-08: 1 mg via INTRAVENOUS
  Filled 2024-03-08: qty 1

## 2024-03-08 MED ORDER — IOHEXOL 300 MG/ML  SOLN
100.0000 mL | Freq: Once | INTRAMUSCULAR | Status: AC | PRN
  Administered 2024-03-09: 100 mL via INTRAVENOUS

## 2024-03-08 NOTE — ED Provider Notes (Signed)
 [facility] Emergency Department Provider Note MRN:  [id number]  Arrival date & time: 03/09/24     Chief Complaint   Abdominal Pain   History of Present Illness   Molly Berry is a 58 y.o. year-old female with a history of endometrial cancer, diabetes presenting to the ED with chief complaint of abdominal pain.  Right lower quadrant abdominal pain worsening over the past 2 to 3 days, getting worse.  Feels different than kidney stone.  Denies fever, no nausea or vomiting, normal bowel movements.  Review of Systems  A thorough review of systems was obtained and all systems are negative except as noted in the HPI and PMH.   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 This could reflect an inflammatory process,  although follow-up is necessary to exclude atypical neoplasm. Full  chest CT should be considered to evaluate for other pulmonary  findings.      Multiple thyroid  nodules    Nephropathy    Pneumonia    PONV (postoperative nausea and vomiting)    Pulmonary nodules    Thyroid  disease    Ulcer    Uterine cancer (HCC) 12/26/2011   Stage 1, grade 1, S/P  hysterectomy initially by robotic technique and then salpingo[facility]oophorectomy at [facility] receiving no postoperative treatment.    Past Surgical History:  Procedure Laterality Date   ABDOMINAL HYSTERECTOMY     endometrial cancer   CHOLECYSTECTOMY     COLONOSCOPY WITH PROPOFOL  N/A 01/19/2014   MFM:[email or web address]   CYSTOSCOPY W/ URETERAL STENT PLACEMENT Bilateral 02/07/2022   Procedure: CYSTOSCOPY WITH RETROGRADE PYELOGRAM/RIGHT URETERAL STENT PLACEMENT WITH POSSIBLE LEFT;  Surgeon: Selma Donnice JONELLE, MD;  Location: [facility];  Service: Urology;  Laterality: Bilateral;   CYSTOSCOPY WITH RETROGRADE PYELOGRAM, URETEROSCOPY AND STENT PLACEMENT Bilateral 03/06/2022   Procedure: CYSTOSCOPY WITH RETROGRADE PYELOGRAM, URETEROSCOPY AND STENT EXCHANGE;  Surgeon: Sherrilee Belvie CROME, MD;  Location: [facility];  Service: Urology;  Laterality: Bilateral;   DILATION AND CURETTAGE OF UTERUS  12 yrs ago   DILATION AND CURETTAGE OF UTERUS  06/18/2011   Procedure: DILATATION AND CURETTAGE;  Surgeon: Vonn VEAR Inch, MD;  Location: [facility];  Service: Gynecology;  Laterality: N/A;  Suction Dilation and Curettage   ESOPHAGOGASTRODUODENOSCOPY N/A 10/12/2013   Dr. Rourk:anastomotic ulcer likely cause of bleeding. likely ischemic    ESOPHAGOGASTRODUODENOSCOPY (EGD) WITH PROPOFOL  N/A 01/19/2014   MFM:[email or web address]   EXTRACORPOREAL SHOCK WAVE LITHOTRIPSY Left 01/11/2018   Procedure: LEFT EXTRACORPOREAL SHOCK WAVE  LITHOTRIPSY (ESWL);  Surgeon: Carolee Sherwood JONETTA DOUGLAS, MD;  Location: [facility];  Service: Urology;  Laterality: Left;   EXTRACORPOREAL SHOCK WAVE LITHOTRIPSY Left 01/14/2018   Procedure: LEFT EXTRACORPOREAL SHOCK WAVE LITHOTRIPSY (ESWL);  Surgeon: Sherrilee Belvie CROME, MD;  Location: [facility];  Service: Urology;  Laterality: Left;  75 MINS  W/ MAC   GASTRIC BYPASS  2013   [facility]   HOLMIUM LASER APPLICATION Bilateral 03/06/2022   Procedure: HOLMIUM LASER APPLICATION;  Surgeon: Sherrilee Belvie CROME, MD;  Location: [facility];  Service: Urology;   Laterality: Bilateral;   HYSTEROSCOPY WITH D & C  06/18/2011   Procedure: DILATATION AND CURETTAGE /HYSTEROSCOPY;  Surgeon: Vonn VEAR Inch, MD;  Location: [facility];  Service: Gynecology;  Laterality: N/A;   LITHOTRIPSY     paniculectomy     SHOULDER ARTHROSCOPY WITH SUBACROMIAL DECOMPRESSION AND OPEN ROTATOR C Left 07/16/2021   Procedure: LEFT ARTHROSCOPIC SUBACROMIAL DECOMPRESSION, MINI OPEN ROTATOR CUFF TEAR REPAIR, BICEPS TENOTOMY;  Surgeon: Anderson Maude ORN, MD;  Location: [facility];  Service: Orthopedics;  Laterality: Left;   STONE EXTRACTION WITH BASKET Bilateral 03/06/2022   Procedure: STONE EXTRACTION WITH BASKET;  Surgeon: Sherrilee Belvie CROME, MD;  Location: [facility];  Service: Urology;  Laterality: Bilateral;   TOTAL KNEE ARTHROPLASTY Left 04/17/2023   Procedure: LEFT TOTAL KNEE ARTHROPLASTY;  Surgeon: Vernetta Lonni GRADE, MD;  Location: [facility];  Service: Orthopedics;  Laterality: Left;   TRIGGER FINGER RELEASE     WISDOM TOOTH EXTRACTION      Family History  Problem Relation Age of Onset   Pneumonia Mother        Deceased   Arthritis Mother    Asthma Mother    Cancer Mother        pancreatic   COPD Mother    Depression Mother    Diabetes Mother    Kidney disease Mother    Liver cancer Father        Living   Arthritis Father    Cancer Father        liver, prostate   COPD Father    Depression Father    Stroke Father    Heart disease Maternal Grandmother    Mental illness Maternal Grandmother    Breast cancer Maternal Grandmother    Alcohol abuse Paternal Grandfather    Breast cancer Paternal Aunt    Anesthesia problems Neg Hx    Hypotension Neg Hx    Malignant hyperthermia Neg Hx    Pseudochol deficiency Neg Hx    Colon cancer Neg Hx     Social History   Socioeconomic History   Marital status: Married    Spouse name: Lynwood Parker   Number of children: Not on file   Years of education: 16   Highest education level: Not on file  Occupational History    Occupation: curator employed)    Employer: NOT EMPLOYED  Tobacco Use   Smoking status: Never   Smokeless tobacco: Never  Vaping Use   Vaping status: Never Used  Substance and Sexual Activity   Alcohol use: No    Alcohol/week: 0.0 standard drinks of alcohol   Drug use: No   Sexual activity: Yes    Birth control/protection: None, Surgical  Other Topics Concern   Not on file  Social History Narrative   Lives with husband and 2 sons 39 and 15. (adopted)    Wants to work but says she is not dependable.  She does do volunteer work.  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  Working on disability.     Education: bachelors degree.   Disabled because of fibromyalgia and depression since 2010   Social Drivers of Health   Financial Resource Strain: Low Risk  (05/26/2023)   Received from [facility]   Overall Financial Resource Strain (CARDIA)    Difficulty of Paying Living Expenses: Not hard at all  Food Insecurity: No Food Insecurity (05/26/2023)   Received [facility]   Hunger Vital Sign    Within the past 12 months, you worried that your food would run out before you got the money to buy more.: Never true    Within the past 12 months, the food you bought just didn't last and you didn't have money to get more.: Never true  Transportation Needs: No Transportation Needs (05/26/2023)   Received from [facility] - Transportation    Lack of Transportation (Medical): No    Lack of Transportation (Non-Medical): No  Physical Activity: Inactive (03/19/2021)   Received from [facility]   Exercise Vital Sign    On average, how many days per week do you engage in moderate to strenuous exercise (like a brisk walk)?: 0 days    On average, how many minutes do you engage in exercise at this level?: 0 min  Stress: Stress Concern Present (03/19/2021)   Received from [facility] - Occupational Stress Questionnaire     Feeling of Stress : Very much  Social Connections: Unknown (09/15/2021)   Received from [facility]   Social Network    Social Network: Not on file  Intimate Partner Violence: Not At Risk (04/17/2023)   Humiliation, Afraid, Rape, and Kick questionnaire    Fear of Current or Ex-Partner: No    Emotionally Abused: No    Physically Abused: No    Sexually Abused: No     Physical Exam   Vitals:   03/08/24 2330 03/09/24 0235  BP: 118/63 (!) 132/58  Pulse: 60 66  Resp:  16  Temp:  98 F (36.7 C)  SpO2: 99% 98%    CONSTITUTIONAL: Well-appearing, NAD NEURO/PSYCH:  Alert and oriented x 3, no focal deficits EYES:  eyes equal and reactive ENT/NECK:  no LAD, no JVD CARDIO: Regular rate, well-perfused, normal S1 and S2 PULM:  CTAB no wheezing or rhonchi GI/GU:  non-distended, moderate focal right lower quadrant tenderness to palpation MSK/SPINE:  No gross deformities, no edema SKIN:  no rash, atraumatic   *Additional and/or pertinent findings included in MDM below  Diagnostic and Interventional Summary    EKG Interpretation Date/Time:    Ventricular Rate:    PR Interval:    QRS Duration:    QT Interval:    QTC Calculation:   R Axis:      Text Interpretation:         Labs Reviewed  COMPREHENSIVE METABOLIC PANEL WITH GFR - Abnormal; Notable for the following components:      Result Value   Creatinine, Ser 1.34 (*)    GFR, Estimated 46 (*)    All other components within normal limits  URINALYSIS, ROUTINE W REFLEX MICROSCOPIC - Abnormal; Notable for the following components:   Leukocytes,Ua TRACE (*)    Bacteria, UA RARE (*)    All other components within normal limits  LIPASE, BLOOD  CBC    CT ABDOMEN PELVIS W CONTRAST  Final Result      Medications  cephALEXin (KEFLEX) capsule 500 mg (has no administration in time range)  metroNIDAZOLE  (FLAGYL ) tablet 500 mg (has no administration in time range)  sodium chloride  0.9 % bolus 1,000 mL (0 mLs Intravenous Stopped  03/09/24 0127)  HYDROmorphone  (DILAUDID ) injection 1 mg (1 mg Intravenous Given 03/08/24 2325)  iohexol  (OMNIPAQUE ) 300 MG/ML solution 100 mL (100 mLs Intravenous Contrast Given 03/09/24 0110)     Procedures  /  Critical Care Procedures  ED Course and Medical Decision Making  Initial Impression and Ddx Differential diagnosis includes appendicitis, right-sided diverticulitis, complication related to neoplasm given history of uterine cancer.  Past medical/surgical history that increases complexity of ED encounter: History of endometrial cancer  Interpretation of Diagnostics I personally reviewed the Laboratory Testing and my interpretation is as follows: No significant blood count or electrolyte disturbance.  CT reveals area of enteritis near the anastomotic site.  Patient Reassessment and Ultimate Disposition/Management     Patient feeling better on reassessment.  Has not had any fever, labs are reassuring with no leukocytosis, tenderness is improved but still focal.  No signs of anastomotic leak or ulcer on the CT scan.  Recommending close follow-up with her bariatric surgeon given the location of this enteritis.  Covering with antibiotics, strict return precautions discussed for fever, uncontrolled pain, symptoms of obstruction.  Patient management required discussion with the following services or consulting groups:  None  Complexity of Problems Addressed Acute illness or injury that poses threat of life of bodily function  Additional Data Reviewed and Analyzed Further history obtained from: Further history from spouse/family member  Additional Factors Impacting ED Encounter Risk Consideration of hospitalization  Ozell HERO. Theadore, MD [facility] [email or web address]  Final Clinical Impressions(s) / ED Diagnoses     ICD-10-CM   1. Right lower quadrant abdominal pain  R10.31       ED Discharge Orders          Ordered     cefdinir  (OMNICEF ) 300 MG capsule  2 times daily        03/09/24 0250    metroNIDAZOLE  (FLAGYL ) 500 MG tablet  3 times daily        03/09/24 0250    naproxen  (NAPROSYN ) 500 MG tablet  2 times daily        03/09/24 0250             Discharge Instructions Discussed with and Provided to Patient:     Discharge Instructions      You were evaluated in the Emergency Department and after careful evaluation, we did not find any emergent condition requiring admission or further testing in the hospital.  Your exam/testing today is overall reassuring.  Symptoms seem to be due to an enteritis near your gastric bypass site.  Recommend taking the cefdinir  and metronidazole  antibiotics as prescribed.  Can use the Naprosyn  twice daily for pain.  Can continue your home pain medications as well.  Recommend close follow-up with the [facility] bariatric surgery department, you can reach them at [phone].  You could also follow-up with Dr. Mavis here in town.  Please return to the Emergency Department if you experience any worsening of your condition.   Thank you for allowing us  to be a part of your care.       Theadore Ozell HERO, MD 03/09/24 [phone]

## 2024-03-08 NOTE — ED Notes (Signed)
 See triage notes. Pt tearful due to pain. Denies n/v/d. Edp aware.

## 2024-03-08 NOTE — ED Triage Notes (Signed)
 Pt c/o right lower abd pain that radiates around to her groin. Pt states pain started on Saturday but has continued to get worse. Hx of kidney stones, says it feels similar to a kidney stone but different this time.

## 2024-03-09 ENCOUNTER — Telehealth: Payer: Self-pay | Admitting: Physical Medicine & Rehabilitation

## 2024-03-09 MED ORDER — OXYCODONE-ACETAMINOPHEN 5-325 MG PO TABS
1.0000 | ORAL_TABLET | Freq: Once | ORAL | Status: AC
Start: 2024-03-09 — End: 2024-03-09
  Administered 2024-03-09: 1 via ORAL
  Filled 2024-03-09: qty 1

## 2024-03-09 MED ORDER — CEFDINIR 300 MG PO CAPS: 300.0000 mg | ORAL_CAPSULE | Freq: Two times a day (BID) | ORAL | 0 refills | Status: AC

## 2024-03-09 MED ORDER — METRONIDAZOLE 500 MG PO TABS
500.0000 mg | ORAL_TABLET | Freq: Once | ORAL | Status: AC
  Administered 2024-03-09: 500 mg via ORAL
  Filled 2024-03-09: qty 1

## 2024-03-09 MED ORDER — METRONIDAZOLE 500 MG PO TABS: 500.0000 mg | ORAL_TABLET | Freq: Three times a day (TID) | ORAL | 0 refills | Status: AC

## 2024-03-09 MED ORDER — NAPROXEN 500 MG PO TABS: 500.0000 mg | ORAL_TABLET | Freq: Two times a day (BID) | ORAL | 0 refills | Status: AC

## 2024-03-09 MED ORDER — CEPHALEXIN 500 MG PO CAPS
500.0000 mg | ORAL_CAPSULE | Freq: Once | ORAL | Status: AC
  Administered 2024-03-09: 500 mg via ORAL
  Filled 2024-03-09: qty 1

## 2024-03-09 NOTE — Telephone Encounter (Signed)
 Patient needs prior auth on Percocet.  She will also need a new prescription, pharmacy only gave her 5 days worth of medication.  Please call patient.

## 2024-03-09 NOTE — Discharge Instructions (Signed)
 You were evaluated in the Emergency Department and after careful evaluation, we did not find any emergent condition requiring admission or further testing in the hospital.  Your exam/testing today is overall reassuring.  Symptoms seem to be due to an enteritis near your gastric bypass site.  Recommend taking the cefdinir  and metronidazole  antibiotics as prescribed.  Can use the Naprosyn  twice daily for pain.  Can continue your home pain medications as well.  Recommend close follow-up with the [facility] bariatric surgery department, you can reach them at [phone].  You could also follow-up with Dr. Mavis here in town.  Please return to the Emergency Department if you experience any worsening of your condition.   Thank you for allowing us  to be a part of your care.

## 2024-03-10 ENCOUNTER — Ambulatory Visit (HOSPITAL_COMMUNITY)
Admission: RE | Admit: 2024-03-10 | Discharge: 2024-03-10 | Disposition: A | Discharge status: Home / Self Care | Source: Ambulatory Visit | Attending: Urology | Admitting: Urology

## 2024-03-10 ENCOUNTER — Telehealth: Payer: Self-pay | Admitting: *Deleted

## 2024-03-10 ENCOUNTER — Telehealth: Payer: Self-pay

## 2024-03-10 DIAGNOSIS — N200 Calculus of kidney: Secondary | ICD-10-CM

## 2024-03-10 NOTE — Telephone Encounter (Signed)
 Outcome Approved today by [facility] Medicaid 2017 NCPDP Request Reference Number: [id number]. OXYCOD/APAP TAB 7.5-325 is approved through 09/07/2024. For further questions, call [employer] at [phone]. Effective Date: 03/10/2024 Authorization Expiration Date: 09/07/2024

## 2024-03-10 NOTE — Telephone Encounter (Signed)
 CT done at ER and kidney stones noted. Patient advised to get kub and to call office back after she gets it. Results will be sent to provider and will follow up with patient with treatment options.

## 2024-03-10 NOTE — Telephone Encounter (Signed)
 Prior auth sent to [facility] for Molly Berry (Key: [id number]) oxycodone  acetaminophen  7.5/325 #120

## 2024-03-10 NOTE — Telephone Encounter (Signed)
 Please call Molly Berry. [name] as soon as you find out if the Rx is approved. She has kidney stones.

## 2024-03-11 ENCOUNTER — Telehealth: Payer: Self-pay | Admitting: Urology

## 2024-03-11 NOTE — Telephone Encounter (Signed)
 Called pt to let her know MD McKenzie verbally stated  he does not see kidney stones and she needs to flow up with her GI doctor ot general surgeon pt stated she did on yesterday and they looked at her images and seen 3 kidney stones pt was reiterated of MD recommendations pt was asked if she ever had surgery on her bowels pt stated  a gastric bypass in 2011, last night she talked to MD [facility] and he was talking about a 2 mm kidney stones and a 3 mm kidney stones pt was again reiterated of MD McKenzie response to her images. Pt voiced her understanding

## 2024-03-11 NOTE — Telephone Encounter (Signed)
 Outcome Approved on November 6 by [facility] 2017 NCPDP Request Reference Number: [id number]. OXYCOD/APAP TAB 7.5-325 is approved through 09/07/2024. For further questions, call [employer] at [phone]. Effective Date: 03/10/2024 Authorization Expiration Date: 09/07/2024  Pt notified.

## 2024-03-11 NOTE — Telephone Encounter (Signed)
 Patient left message that she has kidney stones and is in pain

## 2024-03-14 MED ORDER — OXYCODONE-ACETAMINOPHEN 7.5-325 MG PO TABS: 1.0000 | ORAL_TABLET | Freq: Four times a day (QID) | ORAL | 0 refills | Status: DC | PRN

## 2024-03-14 NOTE — Addendum Note (Signed)
 Addended by: Warnell Rasnic on: 03/14/2024 10:39 AM   Modules accepted: Orders

## 2024-04-13 ENCOUNTER — Other Ambulatory Visit: Payer: Self-pay | Admitting: Physical Medicine & Rehabilitation

## 2024-05-05 ENCOUNTER — Encounter (HOSPITAL_COMMUNITY): Payer: Self-pay | Admitting: Hematology and Oncology

## 2024-05-09 ENCOUNTER — Encounter: Attending: Physical Medicine & Rehabilitation | Admitting: Physical Medicine & Rehabilitation

## 2024-05-09 ENCOUNTER — Encounter: Payer: Self-pay | Admitting: Physical Medicine & Rehabilitation

## 2024-05-09 VITALS — BP 112/70 | HR 70 | Ht 66.0 in | Wt 229.0 lb

## 2024-05-09 DIAGNOSIS — M797 Fibromyalgia: Secondary | ICD-10-CM | POA: Insufficient documentation

## 2024-05-09 DIAGNOSIS — M170 Bilateral primary osteoarthritis of knee: Secondary | ICD-10-CM | POA: Diagnosis not present

## 2024-05-09 DIAGNOSIS — G8929 Other chronic pain: Secondary | ICD-10-CM | POA: Insufficient documentation

## 2024-05-09 DIAGNOSIS — Z5181 Encounter for therapeutic drug level monitoring: Secondary | ICD-10-CM | POA: Diagnosis not present

## 2024-05-09 DIAGNOSIS — M545 Low back pain, unspecified: Secondary | ICD-10-CM | POA: Diagnosis not present

## 2024-05-09 DIAGNOSIS — G894 Chronic pain syndrome: Secondary | ICD-10-CM | POA: Diagnosis not present

## 2024-05-09 MED ORDER — OXYCODONE-ACETAMINOPHEN 7.5-325 MG PO TABS: 1.0000 | ORAL_TABLET | Freq: Four times a day (QID) | ORAL | 0 refills | Status: DC | PRN

## 2024-05-09 MED ORDER — OXYCODONE-ACETAMINOPHEN 7.5-325 MG PO TABS: 1.0000 | ORAL_TABLET | Freq: Four times a day (QID) | ORAL | 0 refills | Status: AC | PRN

## 2024-05-09 NOTE — Progress Notes (Addendum)
 "  Subjective:    Patient ID: Molly Berry, female    DOB: 22-Oct-1965, 59 y.o.   MRN: [id number]  HPI  HPI 08/18/22 Molly Berry is a 59 y.o. year old female  who  has a past medical history of Allergy, Anemia, Anxiety, Arthritis, Asthma, Bell palsy, Blood transfusion without reported diagnosis, Breast mass, Chronic kidney disease, Chronic pain syndrome, COPD (chronic obstructive pulmonary disease), Depression, Diabetes mellitus, Endometrial cancer, Fibromyalgia, GERD (gastroesophageal reflux disease), H/O gastric bypass (05/09/2014), Headache, Hyperlipidemia, Hypertension, Hypothyroidism, Multiple pulmonary nodules (11/30/2012), Multiple thyroid  nodules, Nephropathy, Pneumonia, PONV (postoperative nausea and vomiting), Pulmonary nodules, Thyroid  disease, Ulcer, and Uterine cancer (12/26/2011).   They are presenting to PM&R clinic as a new patient for pain management evaluation.  [name] reports that most of her pain is related to her history of fibromyalgia and osteoarthritis particular of her knees.  She has widespread pain throughout her whole body from fibromyalgia.  She has had knee pain for several years and her knees are her worst area of pain. It was initially worse in her right knee, then her left knee became more severe.  She reports she had cortisone injections with slight benefit however she cannot get gel injections approved by insurance.  She would like to have knee surgery and is followed by orthopedics most recently seen by Ronal Bach, PA.  She is working on weight loss in order to be able to qualify for surgery.  She was previously seen by [facility] pain [facility].  Her pain was previously managed with oxycodone  10 mg 3 times a day.  She reports she was with this practice for about 7 years.  She does report she was discharged after she took a tramadol by a family member gave her when she had had multiple procedures and surgeries for renal stones.  Since this time her PCP has been  prescribing her oxycodone  10 mg.     No red flags for back pain endorsed in Hx or ROS   Medications tried: Nsaids meloxicam  helps, rare use due to concern for kidneys Tylenol  - helps slightly  Opiates  oxycodone  10mg  helps, has used this for many years Gabapentin  1200mg  BID  Lyrica- reports reason she can't take it but doesn't remember what that was  Lamictal  for mood  Buprenorphine - bad feeling  Tramadol didn't help  TCAs  - didn't help SNRIs  cymbalta, did not tolerate it  Other   Tizanidine /robaxin  slightly helps    Other treatments: PT/OT  - helped in the past, she likes integrative therapy TENs unit - made her sore  Injections - knee cortisone   Interval history 09/15/2022 Patient also reports she has been doing well overall regarding her pain control.  Her pain has been severe in her right back and flank recently.  She reports follow-up with urology today in clinic, says she has had multiple issues with kidney stones.  She also had a UA ordered today.  She was started on Macrobid .  Prior to this her pain was much better controlled on Percocet 7.5 mg 4 times a day.  This was providing more consistent pain control than Percocet 10 mg 3 times a day.  Patient reports she got message from [facility]  however needs to call them back.   Interval history 09/15/2022 Patient also reports she has been doing well overall regarding her pain control.  Her pain has been severe in her right back and flank recently.  She reports follow-up with urology today in  clinic, says she has had multiple issues with kidney stones.  She also had a UA ordered today.  She was started on Macrobid .  Prior to this her pain was much better controlled on Percocet 7.5 mg 4 times a day.  This was providing more consistent pain control than Percocet 10 mg 3 times a day.  Patient reports she got message from [facility]  however needs to call them back.   Interval history 03/02/23 Ms. [name] is here for follow-up regarding her  chronic pain.  She reports that pain continues to be poorly controlled.  Her worst pain is at her knees however she has pain all throughout her body.  Percocet 7.5 mg helps some days, other days her pain continues to be very severe.  No side effects with the medication.  She is planning on having knee replacement surgery with Dr. Vernetta.  Interval history 04/07/23 Patient here for follow-up regarding her chronic pain.  Percocet continues to help keep her pain more tolerable.  She does have left TKA scheduled with Dr. Vernetta on 04/17/2023.  She is a little concerned about postoperative pain.  She has been limited in her ability to start physical therapy because her truck is having mechanical issues.  Interval history 05/28/2023 Patient reports she is doing well after her knee replacement.  She was seen by Dr. Vernetta yesterday, note indicates that Dr. Vernetta would like her to follow-up with pain management for continued pain treatment.  She has been doing fairly well on Dilaudid  2 mg every 4 hours as needed.  Patient does report that she thinks this has been causing her GI upset.  She did not have the symptoms with oxycodone .  She has run out of Dilaudid  yesterday and restarted with Percocet 7.5 2 tabs every 4 hours.  She reports she tried 1 tab of Percocet 7.5 but she cannot tolerate the pain.  She is not having any side effects with Percocet.  She reports that Dr. Vernetta is overall happy with her progress.  She is considering having her other knee replaced as well.  Interval history 06/30/23 Patient reports her knee pain is doing better.  She is ready to decrease Percocet to 7.5 dose from the Percocet 10 dose.  She would also like to lower this for when she decides to have her other knee replaced.  She is not having any side effects with the medication.  She continues to have a lot of pain throughout her body.  She continues to be limited in her activity level.  We discussed trying tai chi, even  for about 5 minutes a day and potentially working up.  We also discussed trying aquatic therapy unfortunately she is not close to aquatic therapy center.  She might try joining the [facility] where they have a pool where she can do exercises on her own or potentially do water  aerobics there.   She had problems with prior Auth for the medication but was able to get the Percocet.  Interval history 08/11/22 Patient is here for follow-up up about her chronic pain.  Left knee pain is gradually improving, she is happy with the results overall.  Right knee continues to be very painful.  She has been gradually increasing her activity level.  She is able to ride a horse for about an hour, this is something she very much enjoys doing.  No side effects with the Percocet 7.5 mg, using 5-6 times a day.  She is going to try decrease frequency of  use to 4 times a day.  She is considering surgery for her right knee late April or early May potentially.   Interval History 10/15/23 She has been having pain in her shoulders. She is followed by orthopaedics, Dr. Burnetta.  R shoulder pain improved with cortisone injection with ortho, L shoulder continues to be painful. She is followed with Dr. Burnetta, possibly rotator cuff disorder. PT has been ordered. She is noted to have recent R bicep tendon rupture. He has ordered PT and considering MRI L shoulder.  Percocet doing well at current dose. She is taking about 5 tabs percocet per day and this is helping control her overall pain. No side effects with the medication.   R knee pain is doing OK. L knee has been a little more painful.    Interval History 10/15/23 Patient is here for follow-up of her chronic pain.  Pain has been doing better overall, not having to use Percocet as frequently.  Percocet continues to help her pain, no significant side effects with the medication.  She is happy with results of left knee replacement, considering having this done on the right side.  She also  continues to have pain in her shoulders, follow with Dr. Burnetta sports medicine.  Dr. Burnetta was considering MRI left shoulder if she continues to have difficulty with this area.  Patient feels like she will eventually need surgery in her left shoulder also, but at this time is feeling that she would rather have her right knee replaced before doing the shoulder surgery.  Patient reports tizanidine  has been helping her pain, this also helps her to use less oxycodone .  She usually takes about 4 mg at night, sometimes 2 mg during the day.  Interval History 02/29/24 Reports new-onset back pain for the last month. Describes the pain as screaming and severe enough to awaken from sleep. Denies any specific inciting injury or aggravating activity, though notes it can worsen with prolonged sitting for craft activities. Pain is localized to the mid-to-lower back, without radiation into the legs. It is intermittent and not present today. Denies tenderness to palpation even during painful episodes. States the pain feels like the back knots up. General pain is otherwise controlled on the current regimen. Reports a recent episode of severe, generalized pain lasting a few days, which resolved with the addition of tizanidine . Depression is reportedly improved following a medication adjustment by their psychiatrist. History of kidney stones noted, but current back pain is not consistent with prior episodes of renal colic. No associated UTI symptoms. Has a urology follow-up scheduled with Dr. Jacquline for a scan.  Past Surgical History/Future Plans - Recent cataract surgery on both eyes. Right eye is still bruised and experiences watering/flickering. - Plans for right knee surgery after the holidays. - Plans for shoulder surgery after the knee surgery. Reports a recent steroid injection in the right shoulder by Dr. Burnetta provided significant relief.  Medication Review - Gabapentin : continues to take, reports it  is effective. - Tizanidine : uses as needed for muscle cramping and recent back pain episode, not taken daily. Reports it is helpful. - : Oxycodone : reports it is effective for overall pain control.  05/09/24 interval history Reports new onset of severe back pain, which began around November of last year. The pain originates in the back and radiates to the lower abdomen. It was severe enough to prompt an emergency room visit. The ER visit included a CT scan and X-ray, which were focused on the abdomen.  The diagnosis from the ER was an infection of the lower intestine, and a referral was made to a careers adviser.  Patient reports follow-up with the gastric bypass surgeon confirmed this was not the issue. The pain persists, primarily on the right side of the back, but it also radiates to the left side.  It is managed with her current oxycodone , ice, heat, and rest. The pain medication provides enough relief to allow for some activity before needing to sit down and rest again. Muscle relaxers are also used and are effective, but cause drowsiness if taken during the day.  Patient reports the pain is not attributed to kidney stones, as per the urologist's assessment. Reports a history of multiple kidney stones and is familiar with that type of pain.  She also has a history of fibromyalgia, which may be contributing to the current pain. The right knee is also aching, and surgery she is planning on getting surgery scheduled for this issue.  Pain Inventory Average Pain 5 Pain Right Now 5 My pain is sharp, stabbing, aching  In the last 24 hours, has pain interfered with the following? General activity 7 Relation with others 3 Enjoyment of life 8 What TIME of day is your pain at its worst? Morning, daytime Sleep (in general) Fair  Pain is worse with:standing, some activities Pain improves with: heat/ice and medication, pacing activities, injections Relief from Meds: 8  Family History  Problem Relation Age of  Onset   Pneumonia Mother        Deceased   Arthritis Mother    Asthma Mother    Cancer Mother        pancreatic   COPD Mother    Depression Mother    Diabetes Mother    Kidney disease Mother    Liver cancer Father        Living   Arthritis Father    Cancer Father        liver, prostate   COPD Father    Depression Father    Stroke Father    Heart disease Maternal Grandmother    Mental illness Maternal Grandmother    Breast cancer Maternal Grandmother    Alcohol abuse Paternal Grandfather    Breast cancer Paternal Aunt    Anesthesia problems Neg Hx    Hypotension Neg Hx    Malignant hyperthermia Neg Hx    Pseudochol deficiency Neg Hx    Colon cancer Neg Hx    Social History   Socioeconomic History   Marital status: Married    Spouse name: Lynwood Parker   Number of children: Not on file   Years of education: 16   Highest education level: Not on file  Occupational History   Occupation: curator employed)    Employer: NOT EMPLOYED  Tobacco Use   Smoking status: Never   Smokeless tobacco: Never  Vaping Use   Vaping status: Never Used  Substance and Sexual Activity   Alcohol use: No    Alcohol/week: 0.0 standard drinks of alcohol   Drug use: No   Sexual activity: Yes    Birth control/protection: None, Surgical  Other Topics Concern   Not on file  Social History Narrative   Lives with husband and 2 sons 73 and 15. (adopted)    Wants to work but says she is not dependable.  She does do volunteer work.    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  Working on disability.     Education: bachelors  degree.   Disabled because of fibromyalgia and depression since 2010   Social Drivers of Health   Tobacco Use: Low Risk (04/15/2024)   Received from [facility]   Patient History    Smoking Tobacco Use: Never    Smokeless Tobacco Use: Never    Passive Exposure: Never  Financial Resource Strain: Low Risk (05/26/2023)   Received from [facility]   Overall  Financial Resource Strain (CARDIA)    Difficulty of Paying Living Expenses: Not hard at all  Food Insecurity: No Food Insecurity (05/26/2023)   Received from [facility]   Epic    Within the past 12 months, you worried that your food would run out before you got the money to buy more.: Never true    Within the past 12 months, the food you bought just didn't last and you didn't have money to get more.: Never true  Transportation Needs: No Transportation Needs (05/26/2023)   Received from [facility]    Lack of Transportation (Medical): No    Lack of Transportation (Non-Medical): No  Physical Activity: Not on file  Stress: Not on file  Social Connections: Not on file  Depression (PHQ2-9): High Risk (12/15/2023)   Depression (PHQ2-9)    PHQ-2 Score: 11  Alcohol Screen: Not on file  Housing: Low Risk (05/26/2023)   Received [facility]    In the last 12 months, was there a time when you were not able to pay the mortgage or rent on time?: No    In the past 12 months, how many times have you moved where you were living?: 0    At any time in the past 12 months, were you homeless or living in a shelter (including now)?: No  Utilities: Not At Risk (05/26/2023)   Received from [facility] Utilities    Threatened with loss of utilities: No  Recent Concern: Utilities - At Risk (04/17/2023)   [facility] Utilities    Threatened with loss of utilities: Yes  Health Literacy: Not on file   Past Surgical History:  Procedure Laterality Date   ABDOMINAL HYSTERECTOMY     endometrial cancer   CHOLECYSTECTOMY     COLONOSCOPY WITH PROPOFOL  N/A 01/19/2014   MFM:[email or web address]   CYSTOSCOPY W/ URETERAL STENT PLACEMENT Bilateral 02/07/2022   Procedure: CYSTOSCOPY WITH RETROGRADE PYELOGRAM/RIGHT URETERAL STENT PLACEMENT WITH POSSIBLE LEFT;  Surgeon: Selma Donnice SAUNDERS, MD;  Location: [facility];  Service: Urology;  Laterality: Bilateral;   CYSTOSCOPY WITH RETROGRADE  PYELOGRAM, URETEROSCOPY AND STENT PLACEMENT Bilateral 03/06/2022   Procedure: CYSTOSCOPY WITH RETROGRADE PYELOGRAM, URETEROSCOPY AND STENT EXCHANGE;  Surgeon: Sherrilee Belvie CROME, MD;  Location: [facility];  Service: Urology;  Laterality: Bilateral;   DILATION AND CURETTAGE OF UTERUS  12 yrs ago   DILATION AND CURETTAGE OF UTERUS  06/18/2011   Procedure: DILATATION AND CURETTAGE;  Surgeon: Vonn VEAR Inch, MD;  Location: [facility];  Service: Gynecology;  Laterality: N/A;  Suction Dilation and Curettage   ESOPHAGOGASTRODUODENOSCOPY N/A 10/12/2013   Dr. Rourk:anastomotic ulcer likely cause of bleeding. likely ischemic    ESOPHAGOGASTRODUODENOSCOPY (EGD) WITH PROPOFOL  N/A 01/19/2014   MFM:[email or web address]   EXTRACORPOREAL SHOCK WAVE LITHOTRIPSY Left 01/11/2018   Procedure: LEFT EXTRACORPOREAL SHOCK WAVE LITHOTRIPSY (ESWL);  Surgeon: Carolee Sherwood JONETTA DOUGLAS, MD;  Location: [facility];  Service: Urology;  Laterality: Left;   EXTRACORPOREAL SHOCK WAVE LITHOTRIPSY Left 01/14/2018   Procedure: LEFT EXTRACORPOREAL SHOCK WAVE LITHOTRIPSY (ESWL);  Surgeon:  McKenzie, Belvie CROME, MD;  Location: [facility];  Service: Urology;  Laterality: Left;  75 MINS  W/ MAC   GASTRIC BYPASS  2013   [facility]   HOLMIUM LASER APPLICATION Bilateral 03/06/2022   Procedure: HOLMIUM LASER APPLICATION;  Surgeon: Sherrilee Belvie CROME, MD;  Location: [facility];  Service: Urology;  Laterality: Bilateral;   HYSTEROSCOPY WITH D & C  06/18/2011   Procedure: DILATATION AND CURETTAGE /HYSTEROSCOPY;  Surgeon: Vonn VEAR Inch, MD;  Location: [facility];  Service: Gynecology;  Laterality: N/A;   LITHOTRIPSY     paniculectomy     SHOULDER ARTHROSCOPY WITH SUBACROMIAL DECOMPRESSION AND OPEN ROTATOR C Left 07/16/2021   Procedure: LEFT ARTHROSCOPIC SUBACROMIAL DECOMPRESSION, MINI OPEN ROTATOR CUFF TEAR REPAIR, BICEPS TENOTOMY;  Surgeon: Anderson Maude ORN, MD;  [facility]: [facility];  Service: Orthopedics;  Laterality: Left;   STONE EXTRACTION WITH BASKET Bilateral 03/06/2022   Procedure:  STONE EXTRACTION WITH BASKET;  Surgeon: Sherrilee Belvie CROME, MD;  [facility]: [facility];  Service: Urology;  Laterality: Bilateral;   TOTAL KNEE ARTHROPLASTY Left 04/17/2023   Procedure: LEFT TOTAL KNEE ARTHROPLASTY;  Surgeon: Vernetta Lonni GRADE, MD;  [facility];  Service: Orthopedics;  Laterality: Left;   TRIGGER FINGER RELEASE     WISDOM TOOTH EXTRACTION     Past Surgical History:  Procedure Laterality Date   ABDOMINAL HYSTERECTOMY     endometrial cancer   CHOLECYSTECTOMY     COLONOSCOPY WITH PROPOFOL  N/A 01/19/2014   MFM:[email or web address]   CYSTOSCOPY W/ URETERAL STENT PLACEMENT Bilateral 02/07/2022   Procedure: CYSTOSCOPY WITH RETROGRADE PYELOGRAM/RIGHT URETERAL STENT PLACEMENT WITH POSSIBLE LEFT;  Surgeon: Selma Donnice SAUNDERS, MD;  [facility];  Service: Urology;  Laterality: Bilateral;   CYSTOSCOPY WITH RETROGRADE PYELOGRAM, URETEROSCOPY AND STENT PLACEMENT Bilateral 03/06/2022   Procedure: CYSTOSCOPY WITH RETROGRADE PYELOGRAM, URETEROSCOPY AND STENT EXCHANGE;  Surgeon: Sherrilee Belvie CROME, MD;  [facility]: [facility];  Service: Urology;  Laterality: Bilateral;   DILATION AND CURETTAGE OF UTERUS  12 yrs ago   DILATION AND CURETTAGE OF UTERUS  06/18/2011   Procedure: DILATATION AND CURETTAGE;  Surgeon: Vonn VEAR Inch, MD;  [facility]: [facility];  Service: Gynecology;  Laterality: N/A;  Suction Dilation and Curettage   ESOPHAGOGASTRODUODENOSCOPY N/A 10/12/2013   Dr. Rourk:anastomotic ulcer likely cause of bleeding. likely ischemic    ESOPHAGOGASTRODUODENOSCOPY (EGD) WITH PROPOFOL  N/A 01/19/2014   MFM:[email or web address]   EXTRACORPOREAL SHOCK WAVE LITHOTRIPSY Left 01/11/2018   Procedure: LEFT EXTRACORPOREAL SHOCK WAVE LITHOTRIPSY (ESWL);  Surgeon: Carolee Sherwood JONETTA DOUGLAS, MD;  [facility];  Service: Urology;  Laterality: Left;   EXTRACORPOREAL SHOCK WAVE LITHOTRIPSY Left 01/14/2018   Procedure: LEFT EXTRACORPOREAL SHOCK WAVE LITHOTRIPSY (ESWL);  Surgeon: Sherrilee Belvie CROME, MD;  [facility];  Service:  Urology;  Laterality: Left;  75 MINS  W/ MAC   GASTRIC BYPASS  2013   [facility]   HOLMIUM LASER APPLICATION Bilateral 03/06/2022   Procedure: HOLMIUM LASER APPLICATION;  Surgeon: Sherrilee Belvie CROME, MD;  [facility]: [facility];  Service: Urology;  Laterality: Bilateral;   HYSTEROSCOPY WITH D & C  06/18/2011   Procedure: DILATATION AND CURETTAGE /HYSTEROSCOPY;  Surgeon: Vonn VEAR Inch, MD;  [facility]: [facility];  Service: Gynecology;  Laterality: N/A;   LITHOTRIPSY     paniculectomy     SHOULDER ARTHROSCOPY WITH SUBACROMIAL DECOMPRESSION AND OPEN ROTATOR C Left 07/16/2021   Procedure: LEFT ARTHROSCOPIC SUBACROMIAL DECOMPRESSION, MINI OPEN ROTATOR CUFF TEAR REPAIR, BICEPS TENOTOMY;  Surgeon: Anderson Maude ORN, MD;  [facility];  Service:  Orthopedics;  Laterality: Left;   STONE EXTRACTION WITH BASKET Bilateral 03/06/2022   Procedure: STONE EXTRACTION WITH BASKET;  Surgeon: Sherrilee Belvie CROME, MD;  [facility];  Service: Urology;  Laterality: Bilateral;   TOTAL KNEE ARTHROPLASTY Left 04/17/2023   Procedure: LEFT TOTAL KNEE ARTHROPLASTY;  Surgeon: Vernetta Lonni GRADE, MD;  [facility]: [facility];  Service: Orthopedics;  Laterality: Left;   TRIGGER FINGER RELEASE     WISDOM TOOTH EXTRACTION     Past Medical History:  Diagnosis Date   Allergy    Anemia    Anxiety    Arthritis    knees, multiple joints   Asthma    Bell palsy    Blood transfusion without reported diagnosis    Breast mass    lt breast mass x's 3 years increased in size   Chronic kidney disease    Chronic pain syndrome    COPD (chronic obstructive pulmonary disease) (HCC)    Depression    Diabetes mellitus    Endometrial cancer (HCC)    Fibromyalgia    GERD (gastroesophageal reflux disease)    H/O gastric bypass 05/09/2014   At [facility]   Headache    mirgraines   Heart murmur    Hyperlipidemia    Hypertension    Hypothyroidism    Multiple pulmonary nodules 11/30/2012   Followed in Pulmonary clinic/ [facility]/  [place]  - See CT abd  10/29/12  New right lower lobe pulmonary nodularity, primarily ground-  glass in density. This could reflect an inflammatory process,  although follow-up is necessary to exclude atypical neoplasm. Full  chest CT should be considered to evaluate for other pulmonary  findings.      Multiple thyroid  nodules    Nephropathy    Pneumonia    PONV (postoperative nausea and vomiting)    Pulmonary nodules    Thyroid  disease    Ulcer    Uterine cancer (HCC) 12/26/2011   Stage 1, grade 1, S/P hysterectomy initially by robotic technique and then salpingo-oophorectomy at [facility] receiving no postoperative treatment.   BP 112/70   Pulse 70   Ht 5' 6 (1.676 m)   Wt 229 lb (103.9 kg)   LMP 05/06/2011   SpO2 94%   BMI 36.96 kg/m   Opioid Risk Score:   Fall Risk Score:  `1  Depression screen [facility] 2/9     12/15/2023   11:21 AM 10/15/2023   11:41 AM 08/11/2023   11:18 AM 04/07/2023   10:25 AM 03/02/2023   10:20 AM 01/02/2023    1:45 PM 09/15/2022   11:06 AM  Depression screen PHQ 2/9  Decreased Interest 2 0 1 1 0 1 2  Down, Depressed, Hopeless 2 0 1 1 0 1 2  PHQ - 2 Score 4 0 2 2 0 2 4  Altered sleeping 2        Tired, decreased energy 2        Change in appetite 2        Feeling bad or failure about yourself  1        Trouble concentrating 0        Moving slowly or fidgety/restless 0        Suicidal thoughts 0        PHQ-9 Score 11         Difficult doing work/chores Very difficult           Data saved with a previous flowsheet row definition  Review of Systems  Musculoskeletal:  Positive for back pain, gait problem and neck pain.       B/L hand, knees, shoulder and B/L foot pain  All other systems reviewed and are negative.      Objective:   Physical Exam   Gen: no distress, normal appearing HEENT: oral mucosa pink and moist, NCAT Chest: normal effort, normal rate of breathing Abd: soft, non-distended Ext: no edema Psych:  pleasant and appropriate   Skin: intact Neuro: Alert and awake, follows commands, cranial nerves II through XII grossly intact  Moving all 4 extremities to gravity and resistance Musculoskeletal:  On examination, there is tenderness to palpation over the lower back, particularly on the right side. There is also some tenderness over the shoulders and ankles. Bending backwards reproduces pain in the lower back on the right side. Straight leg raise is negative bilaterally. Right greater than left knee tenderness. Mild diffuse tenderness throughout her upper and lower extremities to palpation    Xray L knee result 03/10/22 Three-view radiographs of the left knee were obtained today.  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  She has periarticular  osteophytes no acute fractures are noted    Xray L knee result 12/02/21  Radiographs of her right knee in 3 projections were reviewed today.  She  has tricompartmental arthritis with most changes in the medial compartment  with near bone-on-bone.  Also patellofemoral sclerosis and osteophyte  formation no acute fractures         Assessment & Plan:   Knee OA bilateral -Could consider genicular nerve block if she does not have surgery -S/p L TKA by Dr. Vernetta  04/17/23 -ORT low  -Continue UDS and pill counts.  Continue PDMP monitoring.  Pain contract completed prior visit. -Discussed bringing pill bottle with any medications even if empty to all appointments  -Continue Percocet 7.5 every 6 hours as needed, #120 ordered -Continue tizanidine  as needed -Continue gabapentin  1200 mg twice daily -Patient says she is planning to schedule her knee replacement soon  B/L shoulder pain left greater then right -Followed by ortho Dr. Burnetta, Recent MRI of shoulder, surgery may be considered at later time -Continue medications as  above  Fibromyalgia  -Continue tizanidine  as needed, will order tizanidine  2 mg tabs.  Pt to use 2 to 4 mg at night as needed and 2 mg during the day as needed.  #90 ordered.  Discussed possible side effects of this medication.   -Continue gabapentin  1200 mg twice daily -Aquatic therapy -will order today -Zynex Nexwave device, Zynex Cryoheat blanket ordered prior visit - Recommend tai chi -She does have frequent stiffness in her joints , could consider rheumatology consult to rule out other rheumatological causes.   Right back and flank pain with Hx of Kidney stones -Improved, continue follow-up with urology as directed -Xray L spine ordered-advised her to complete -Could try TENS unit to lower back - PT consult placed  Worsening back pain, ok to increase to 5 times a day for percocet  "

## 2024-05-10 ENCOUNTER — Encounter: Admitting: Physical Medicine & Rehabilitation

## 2024-05-23 ENCOUNTER — Ambulatory Visit (HOSPITAL_COMMUNITY)
Admission: RE | Admit: 2024-05-23 | Discharge: 2024-05-23 | Disposition: A | Source: Ambulatory Visit | Attending: Physical Medicine & Rehabilitation

## 2024-05-23 DIAGNOSIS — G8929 Other chronic pain: Secondary | ICD-10-CM | POA: Insufficient documentation

## 2024-05-23 DIAGNOSIS — M545 Low back pain, unspecified: Secondary | ICD-10-CM | POA: Diagnosis present

## 2024-05-24 ENCOUNTER — Telehealth: Payer: Self-pay | Admitting: Physical Medicine & Rehabilitation

## 2024-05-24 ENCOUNTER — Encounter: Payer: Self-pay | Admitting: Physical Medicine & Rehabilitation

## 2024-05-24 NOTE — Telephone Encounter (Signed)
 Patient is in so much pain and would like to know results of xray.  Wants to know what she can do for pain?

## 2024-05-27 ENCOUNTER — Ambulatory Visit: Admitting: Urology

## 2024-05-27 DIAGNOSIS — N200 Calculus of kidney: Secondary | ICD-10-CM

## 2024-06-01 ENCOUNTER — Ambulatory Visit: Admitting: Orthopaedic Surgery

## 2024-06-08 ENCOUNTER — Other Ambulatory Visit: Payer: Self-pay

## 2024-06-08 ENCOUNTER — Ambulatory Visit: Admitting: Orthopaedic Surgery

## 2024-06-08 VITALS — Ht 66.0 in | Wt 223.0 lb

## 2024-06-08 DIAGNOSIS — M25561 Pain in right knee: Secondary | ICD-10-CM

## 2024-06-08 DIAGNOSIS — M1711 Unilateral primary osteoarthritis, right knee: Secondary | ICD-10-CM | POA: Diagnosis not present

## 2024-06-08 DIAGNOSIS — G8929 Other chronic pain: Secondary | ICD-10-CM

## 2024-06-08 NOTE — Progress Notes (Signed)
 Molly Berry is a 59 year old female well-known to us .  We have been seeing her for several years for bilateral knee arthritis.  We did replace her left knee back in December 2024 and that is done very well.  At this point her right knee pain is daily and it is detrimentally affecting her mobility, her quality of life and her actives day living.  It is 10 out of 10 pain.  She has had steroid injections in that knee as well as worked on physical therapy with activity modification and quad strengthening exercises.  She has tried anti-inflammatories as well.  X-rays from even 2 years ago showed bone-on-bone wear of the right knee.  She says the left knee replacement has done well.  She is interested in proceeding with right knee replacement surgery.  She is a diabetic but has a hemoglobin A1c of below 6.  I did review all of her medications and past medical history with her within epic.  Her right knee has varus malalignment and significant medial joint line tenderness as well as patellofemoral crepitation and pain throughout the arc of motion of the right knee.  Her left total knee arthroplasty is ligaments stable has full range of motion with no swelling.  An AP and lateral of the right knee shows end-stage bone-on-bone arthritis of the right knee.  There is medial and patellofemoral narrowing which is significant as well as large osteophytes in all 3 compartments.  The AP view shows a well aligned left total knee replacement.  These are standing films.  At this point we will work on scheduling her for a right knee replacement.  She wishes to proceed with that surgery as well and fully understands the risks and benefits of surgery and what to expect from an intraoperative and postoperative standpoint having had this before.  We will work on getting her scheduled for surgery.

## 2024-06-09 ENCOUNTER — Encounter: Attending: Physical Medicine & Rehabilitation | Admitting: Physical Medicine & Rehabilitation

## 2024-06-09 ENCOUNTER — Encounter: Payer: Self-pay | Admitting: Physical Medicine & Rehabilitation

## 2024-06-09 VITALS — BP 103/69 | HR 75 | Ht 66.0 in | Wt 223.0 lb

## 2024-06-09 DIAGNOSIS — Z79891 Long term (current) use of opiate analgesic: Secondary | ICD-10-CM

## 2024-06-09 DIAGNOSIS — G894 Chronic pain syndrome: Secondary | ICD-10-CM

## 2024-06-09 DIAGNOSIS — Z5181 Encounter for therapeutic drug level monitoring: Secondary | ICD-10-CM

## 2024-06-09 DIAGNOSIS — M797 Fibromyalgia: Secondary | ICD-10-CM

## 2024-06-09 DIAGNOSIS — G8929 Other chronic pain: Secondary | ICD-10-CM

## 2024-06-09 DIAGNOSIS — M545 Low back pain, unspecified: Secondary | ICD-10-CM

## 2024-06-09 MED ORDER — OXYCODONE-ACETAMINOPHEN 7.5-325 MG PO TABS: 1.0000 | ORAL_TABLET | Freq: Four times a day (QID) | ORAL | 0 refills | Status: AC | PRN

## 2024-06-09 NOTE — Progress Notes (Signed)
 "  Subjective:    Patient ID: Molly Berry, female    DOB: 1965-05-19, 59 y.o.   MRN: [id number]  HPI  HPI 08/18/22 Molly Berry is a 59 y.o. year old female  who  has a past medical history of Allergy, Anemia, Anxiety, Arthritis, Asthma, Bell palsy, Blood transfusion without reported diagnosis, Breast mass, Chronic kidney disease, Chronic pain syndrome, COPD (chronic obstructive pulmonary disease), Depression, Diabetes mellitus, Endometrial cancer, Fibromyalgia, GERD (gastroesophageal reflux disease), H/O gastric bypass (05/09/2014), Headache, Hyperlipidemia, Hypertension, Hypothyroidism, Multiple pulmonary nodules (11/30/2012), Multiple thyroid  nodules, Nephropathy, Pneumonia, PONV (postoperative nausea and vomiting), Pulmonary nodules, Thyroid  disease, Ulcer, and Uterine cancer (12/26/2011).   They are presenting to PM&R clinic as a new patient for pain management evaluation.  Molly Berry reports that most of her pain is related to her history of fibromyalgia and osteoarthritis particular of her knees.  She has widespread pain throughout her whole body from fibromyalgia.  She has had knee pain for several years and her knees are her worst area of pain. It was initially worse in her right knee, then her left knee became more severe.  She reports she had cortisone injections with slight benefit however she cannot get gel injections approved by insurance.  She would like to have knee surgery and is followed by orthopedics most recently seen by Ronal Bach, PA.  She is working on weight loss in order to be able to qualify for surgery.  She was previously seen by [facility] pain [facility].  Her pain was previously managed with oxycodone  10 mg 3 times a day.  She reports she was with this practice for about 7 years.  She does report she was discharged after she took a tramadol by a family member gave her when she had had multiple procedures and surgeries for renal stones.  Since this time her PCP has been  prescribing her oxycodone  10 mg.     No red flags for back pain endorsed in Hx or ROS   Medications tried: Nsaids meloxicam  helps, rare use due to concern for kidneys Tylenol  - helps slightly  Opiates  oxycodone  10mg  helps, has used this for many years Gabapentin  1200mg  BID  Lyrica- reports reason she can't take it but doesn't remember what that was  Lamictal  for mood  Buprenorphine - bad feeling  Tramadol didn't help  TCAs  - didn't help SNRIs  cymbalta, did not tolerate it  Other   Tizanidine /robaxin  slightly helps    Other treatments: PT/OT  - helped in the past, she likes integrative therapy TENs unit - made her sore  Injections - knee cortisone   Interval history 09/15/2022 Patient also reports she has been doing well overall regarding her pain control.  Her pain has been severe in her right back and flank recently.  She reports follow-up with urology today in clinic, says she has had multiple issues with kidney stones.  She also had a UA ordered today.  She was started on Macrobid .  Prior to this her pain was much better controlled on Percocet 7.5 mg 4 times a day.  This was providing more consistent pain control than Percocet 10 mg 3 times a day.  Patient reports she got message from [facility]  however needs to call them back.   Interval history 09/15/2022 Patient also reports she has been doing well overall regarding her pain control.  Her pain has been severe in her right back and flank recently.  She reports follow-up with urology today in  clinic, says she has had multiple issues with kidney stones.  She also had a UA ordered today.  She was started on Macrobid .  Prior to this her pain was much better controlled on Percocet 7.5 mg 4 times a day.  This was providing more consistent pain control than Percocet 10 mg 3 times a day.  Patient reports she got message from [facility]  however needs to call them back.   Interval history 03/02/23 Molly Berry is here for follow-up regarding her  chronic pain.  She reports that pain continues to be poorly controlled.  Her worst pain is at her knees however she has pain all throughout her body.  Percocet 7.5 mg helps some days, other days her pain continues to be very severe.  No side effects with the medication.  She is planning on having knee replacement surgery with Dr. Vernetta.  Interval history 04/07/23 Patient here for follow-up regarding her chronic pain.  Percocet continues to help keep her pain more tolerable.  She does have left TKA scheduled with Dr. Vernetta on 04/17/2023.  She is a little concerned about postoperative pain.  She has been limited in her ability to start physical therapy because her truck is having mechanical issues.  Interval history 05/28/2023 Patient reports she is doing well after her knee replacement.  She was seen by Dr. Vernetta yesterday, note indicates that Dr. Vernetta would like her to follow-up with pain management for continued pain treatment.  She has been doing fairly well on Dilaudid  2 mg every 4 hours as needed.  Patient does report that she thinks this has been causing her GI upset.  She did not have the symptoms with oxycodone .  She has run out of Dilaudid  yesterday and restarted with Percocet 7.5 2 tabs every 4 hours.  She reports she tried 1 tab of Percocet 7.5 but she cannot tolerate the pain.  She is not having any side effects with Percocet.  She reports that Dr. Vernetta is overall happy with her progress.  She is considering having her other knee replaced as well.  Interval history 06/30/23 Patient reports her knee pain is doing better.  She is ready to decrease Percocet to 7.5 dose from the Percocet 10 dose.  She would also like to lower this for when she decides to have her other knee replaced.  She is not having any side effects with the medication.  She continues to have a lot of pain throughout her body.  She continues to be limited in her activity level.  We discussed trying tai chi, even  for about 5 minutes a day and potentially working up.  We also discussed trying aquatic therapy unfortunately she is not close to aquatic therapy center.  She might try joining the [facility] where they have a pool where she can do exercises on her own or potentially do water  aerobics there.   She had problems with prior Auth for the medication but was able to get the Percocet.  Interval history 08/11/22 Patient is here for follow-up up about her chronic pain.  Left knee pain is gradually improving, she is happy with the results overall.  Right knee continues to be very painful.  She has been gradually increasing her activity level.  She is able to ride a horse for about an hour, this is something she very much enjoys doing.  No side effects with the Percocet 7.5 mg, using 5-6 times a day.  She is going to try decrease frequency of  use to 4 times a day.  She is considering surgery for her right knee late April or early May potentially.   Interval History 10/15/23 She has been having pain in her shoulders. She is followed by orthopaedics, Dr. Burnetta.  R shoulder pain improved with cortisone injection with ortho, L shoulder continues to be painful. She is followed with Dr. Burnetta, possibly rotator cuff disorder. PT has been ordered. She is noted to have recent R bicep tendon rupture. He has ordered PT and considering MRI L shoulder.  Percocet doing well at current dose. She is taking about 5 tabs percocet per day and this is helping control her overall pain. No side effects with the medication.   R knee pain is doing OK. L knee has been a little more painful.    Interval History 10/15/23 Patient is here for follow-up of her chronic pain.  Pain has been doing better overall, not having to use Percocet as frequently.  Percocet continues to help her pain, no significant side effects with the medication.  She is happy with results of left knee replacement, considering having this done on the right side.  She also  continues to have pain in her shoulders, follow with Dr. Burnetta sports medicine.  Dr. Burnetta was considering MRI left shoulder if she continues to have difficulty with this area.  Patient feels like she will eventually need surgery in her left shoulder also, but at this time is feeling that she would rather have her right knee replaced before doing the shoulder surgery.  Patient reports tizanidine  has been helping her pain, this also helps her to use less oxycodone .  She usually takes about 4 mg at night, sometimes 2 mg during the day.  Interval History 02/29/24 Reports new-onset back pain for the last month. Describes the pain as screaming and severe enough to awaken from sleep. Denies any specific inciting injury or aggravating activity, though notes it can worsen with prolonged sitting for craft activities. Pain is localized to the mid-to-lower back, without radiation into the legs. It is intermittent and not present today. Denies tenderness to palpation even during painful episodes. States the pain feels like the back knots up. General pain is otherwise controlled on the current regimen. Reports a recent episode of severe, generalized pain lasting a few days, which resolved with the addition of tizanidine . Depression is reportedly improved following a medication adjustment by their psychiatrist. History of kidney stones noted, but current back pain is not consistent with prior episodes of renal colic. No associated UTI symptoms. Has a urology follow-up scheduled with Dr. Jacquline for a scan.  Past Surgical History/Future Plans - Recent cataract surgery on both eyes. Right eye is still bruised and experiences watering/flickering. - Plans for right knee surgery after the holidays. - Plans for shoulder surgery after the knee surgery. Reports a recent steroid injection in the right shoulder by Dr. Burnetta provided significant relief.  Medication Review - Gabapentin : continues to take, reports it  is effective. - Tizanidine : uses as needed for muscle cramping and recent back pain episode, not taken daily. Reports it is helpful. - : Oxycodone : reports it is effective for overall pain control.  05/09/24 interval history Reports new onset of severe back pain, which began around November of last year. The pain originates in the back and radiates to the lower abdomen. It was severe enough to prompt an emergency room visit. The ER visit included a CT scan and X-ray, which were focused on the abdomen.  The diagnosis from the ER was an infection of the lower intestine, and a referral was made to a careers adviser.  Patient reports follow-up with the gastric bypass surgeon confirmed this was not the issue. The pain persists, primarily on the right side of the back, but it also radiates to the left side.  It is managed with her current oxycodone , ice, heat, and rest. The pain medication provides enough relief to allow for some activity before needing to sit down and rest again. Muscle relaxers are also used and are effective, but cause drowsiness if taken during the day.  Patient reports the pain is not attributed to kidney stones, as per the urologist's assessment. Reports a history of multiple kidney stones and is familiar with that type of pain.  She also has a history of fibromyalgia, which may be contributing to the current pain. The right knee is also aching, and surgery she is planning on getting surgery scheduled for this issue.  06/09/24 interval history The patient reports worsening back pain, which is now her primary complaint. The pain is located in the upper lumbar spine, extending up to the lower shoulder blades. She describes it as a constant ache that worsens with activity, causing muscle knots and spasms. She denies any shooting pain down the legs. She notes that muscle relaxers provide some relief, suggesting a muscular component to her pain. She also reports a recent brief episode of numbness on the back  of her left hand, which has since resolved.  She has a history of fibromyalgia, which she believes exacerbates her pain. She also has a history of degenerative disc disease and arthritis in her back.   She is scheduled for a right knee replacement and is trying to manage her pain with her current regimen to avoid increasing her medication dose.   Pain Inventory Average Pain 5 Pain Right Now 5 My pain is sharp, stabbing,burning,tingling  In the last 24 hours, has pain interfered with the following? General activity 3 Relation with others 3 Enjoyment of life 3 What TIME of day is your pain at its worst? Morning, daytime,evening & night Sleep (in general) Good  Pain is worse with:standing,bending, unsure Pain improves with: rest, heat/ice and medication Relief from Meds: 7  Family History  Problem Relation Age of Onset   Pneumonia Mother        Deceased   Arthritis Mother    Asthma Mother    Cancer Mother        pancreatic   COPD Mother    Depression Mother    Diabetes Mother    Kidney disease Mother    Liver cancer Father        Living   Arthritis Father    Cancer Father        liver, prostate   COPD Father    Depression Father    Stroke Father    Heart disease Maternal Grandmother    Mental illness Maternal Grandmother    Breast cancer Maternal Grandmother    Alcohol abuse Paternal Grandfather    Breast cancer Paternal Aunt    Anesthesia problems Neg Hx    Hypotension Neg Hx    Malignant hyperthermia Neg Hx    Pseudochol deficiency Neg Hx    Colon cancer Neg Hx    Social History   Socioeconomic History   Marital status: Married    Spouse name: Lynwood Parker   Number of children: Not on file   Years of education: 30  Highest education level: Not on file  Occupational History   Occupation: curator employed)    Employer: NOT EMPLOYED  Tobacco Use   Smoking status: Never   Smokeless tobacco: Never  Vaping Use   Vaping status: Never Used   Substance and Sexual Activity   Alcohol use: No    Alcohol/week: 0.0 standard drinks of alcohol   Drug use: No   Sexual activity: Yes    Birth control/protection: None, Surgical  Other Topics Concern   Not on file  Social History Narrative   Lives with husband and 2 sons 47 and 15. (adopted)    Wants to work but says she is not dependable.  She does do volunteer work.    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  Working on disability.     Education: bachelors degree.   Disabled because of fibromyalgia and depression since 2010   Social Drivers of Health   Tobacco Use: Low Risk (05/09/2024)   Patient History    Smoking Tobacco Use: Never    Smokeless Tobacco Use: Never    Passive Exposure: Not on file  Financial Resource Strain: Low Risk (05/26/2023)   Received from [facility]   Overall Financial Resource Strain (CARDIA)    Difficulty of Paying Living Expenses: Not hard at all  Food Insecurity: No Food Insecurity (05/26/2023)   Received from [facility]   Epic    Within the past 12 months, you worried that your food would run out before you got the money to buy more.: Never true    Within the past 12 months, the food you bought just didn't last and you didn't have money to get more.: Never true  Transportation Needs: No Transportation Needs (05/26/2023)   Received from [facility] - Transportation    Lack of Transportation (Medical): No    Lack of Transportation (Non-Medical): No  Physical Activity: Not on file  Stress: Not on file  Social Connections: Not on file  Depression (PHQ2-9): High Risk (12/15/2023)   Depression (PHQ2-9)    PHQ-2 Score: 11  Alcohol Screen: Not on file  Housing: Low Risk (05/26/2023)   Received from [facility]    In the last 12 months, was there a time when you were not able to pay the mortgage or rent on time?: No    In the past 12 months, how many times have you moved where you were living?: 0    At any time in the past  12 months, were you homeless or living in a shelter (including now)?: No  Utilities: Not At Risk (05/26/2023)   Received from [facility] Utilities    Threatened with loss of utilities: No  Recent Concern: Utilities - At Risk (04/17/2023)   [facility] Utilities    Threatened with loss of utilities: Yes  Health Literacy: Not on file   Past Surgical History:  Procedure Laterality Date   ABDOMINAL HYSTERECTOMY     endometrial cancer   CHOLECYSTECTOMY     COLONOSCOPY WITH PROPOFOL  N/A 01/19/2014   MFM:[email or web address]   CYSTOSCOPY W/ URETERAL STENT PLACEMENT Bilateral 02/07/2022   Procedure: CYSTOSCOPY WITH RETROGRADE PYELOGRAM/RIGHT URETERAL STENT PLACEMENT WITH POSSIBLE LEFT;  Surgeon: Selma Donnice SAUNDERS, MD;  Location: [facility];  Service: Urology;  Laterality: Bilateral;   CYSTOSCOPY WITH RETROGRADE PYELOGRAM, URETEROSCOPY AND STENT PLACEMENT Bilateral 03/06/2022   Procedure: CYSTOSCOPY WITH RETROGRADE PYELOGRAM, URETEROSCOPY AND STENT EXCHANGE;  Surgeon: Sherrilee Belvie CROME,  MD;  Location: [facility];  Service: Urology;  Laterality: Bilateral;   DILATION AND CURETTAGE OF UTERUS  12 yrs ago   DILATION AND CURETTAGE OF UTERUS  06/18/2011   Procedure: DILATATION AND CURETTAGE;  Surgeon: Vonn VEAR Inch, MD;  Location: [facility];  Service: Gynecology;  Laterality: N/A;  Suction Dilation and Curettage   ESOPHAGOGASTRODUODENOSCOPY N/A 10/12/2013   Dr. Rourk:anastomotic ulcer likely cause of bleeding. likely ischemic    ESOPHAGOGASTRODUODENOSCOPY (EGD) WITH PROPOFOL  N/A 01/19/2014   MFM:[email or web address]   EXTRACORPOREAL SHOCK WAVE LITHOTRIPSY Left 01/11/2018   Procedure: LEFT EXTRACORPOREAL SHOCK WAVE LITHOTRIPSY (ESWL);  Surgeon: Carolee Sherwood JONETTA DOUGLAS, MD;  Location: [facility];  Service: Urology;  Laterality: Left;   EXTRACORPOREAL SHOCK WAVE LITHOTRIPSY Left 01/14/2018   Procedure: LEFT EXTRACORPOREAL SHOCK WAVE LITHOTRIPSY (ESWL);  Surgeon: Sherrilee Belvie CROME, MD;  Location: [facility];  Service: Urology;  Laterality: Left;  75  MINS  W/ MAC   GASTRIC BYPASS  2013   [facility]   HOLMIUM LASER APPLICATION Bilateral 03/06/2022   Procedure: HOLMIUM LASER APPLICATION;  Surgeon: Sherrilee Belvie CROME, MD;  Location: [facility];  Service: Urology;  Laterality: Bilateral;   HYSTEROSCOPY WITH D & C  06/18/2011   Procedure: DILATATION AND CURETTAGE /HYSTEROSCOPY;  Surgeon: Vonn VEAR Inch, MD;  Location: [facility];  Service: Gynecology;  Laterality: N/A;   LITHOTRIPSY     paniculectomy     SHOULDER ARTHROSCOPY WITH SUBACROMIAL DECOMPRESSION AND OPEN ROTATOR C Left 07/16/2021   Procedure: LEFT ARTHROSCOPIC SUBACROMIAL DECOMPRESSION, MINI OPEN ROTATOR CUFF TEAR REPAIR, BICEPS TENOTOMY;  Surgeon: Anderson Maude ORN, MD;  [facility]: [facility];  Service: Orthopedics;  Laterality: Left;   STONE EXTRACTION WITH BASKET Bilateral 03/06/2022   Procedure: STONE EXTRACTION WITH BASKET;  Surgeon: Sherrilee Belvie CROME, MD;  Location: [facility];  Service: Urology;  Laterality: Bilateral;   TOTAL KNEE ARTHROPLASTY Left 04/17/2023   Procedure: LEFT TOTAL KNEE ARTHROPLASTY;  Surgeon: Vernetta Lonni GRADE, MD;  [facility]: [facility];  Service: Orthopedics;  Laterality: Left;   TRIGGER FINGER RELEASE     WISDOM TOOTH EXTRACTION     Past Surgical History:  Procedure Laterality Date   ABDOMINAL HYSTERECTOMY     endometrial cancer   CHOLECYSTECTOMY     COLONOSCOPY WITH PROPOFOL  N/A 01/19/2014   MFM:[email or web address]   CYSTOSCOPY W/ URETERAL STENT PLACEMENT Bilateral 02/07/2022   Procedure: CYSTOSCOPY WITH RETROGRADE PYELOGRAM/RIGHT URETERAL STENT PLACEMENT WITH POSSIBLE LEFT;  Surgeon: Selma Donnice SAUNDERS, MD;  [facility];  Service: Urology;  Laterality: Bilateral;   CYSTOSCOPY WITH RETROGRADE PYELOGRAM, URETEROSCOPY AND STENT PLACEMENT Bilateral 03/06/2022   Procedure: CYSTOSCOPY WITH RETROGRADE PYELOGRAM, URETEROSCOPY AND STENT EXCHANGE;  Surgeon: Sherrilee Belvie CROME, MD;  [facility]: [facility];  Service: Urology;  Laterality: Bilateral;   DILATION AND CURETTAGE OF UTERUS  12  yrs ago   DILATION AND CURETTAGE OF UTERUS  06/18/2011   Procedure: DILATATION AND CURETTAGE;  Surgeon: Vonn VEAR Inch, MD;  [facility]: [facility];  Service: Gynecology;  Laterality: N/A;  Suction Dilation and Curettage   ESOPHAGOGASTRODUODENOSCOPY N/A 10/12/2013   Dr. Rourk:anastomotic ulcer likely cause of bleeding. likely ischemic    ESOPHAGOGASTRODUODENOSCOPY (EGD) WITH PROPOFOL  N/A 01/19/2014   MFM:[email or web address]   EXTRACORPOREAL SHOCK WAVE LITHOTRIPSY Left 01/11/2018   Procedure: LEFT EXTRACORPOREAL SHOCK WAVE LITHOTRIPSY (ESWL);  Surgeon: Carolee Sherwood JONETTA DOUGLAS, MD;  [facility];  Service: Urology;  Laterality: Left;   EXTRACORPOREAL SHOCK WAVE LITHOTRIPSY Left 01/14/2018   Procedure: LEFT EXTRACORPOREAL SHOCK WAVE LITHOTRIPSY (ESWL);  Surgeon: Sherrilee,  Belvie CROME, MD;  [facility]: [facility];  Service: Urology;  Laterality: Left;  75 MINS  W/ MAC   GASTRIC BYPASS  2013   [facility]   HOLMIUM LASER APPLICATION Bilateral 03/06/2022   Procedure: HOLMIUM LASER APPLICATION;  Surgeon: Sherrilee Belvie CROME, MD;  [facility]: [facility];  Service: Urology;  Laterality: Bilateral;   HYSTEROSCOPY WITH D & C  06/18/2011   Procedure: DILATATION AND CURETTAGE /HYSTEROSCOPY;  Surgeon: Vonn VEAR Inch, MD;  [facility]: [facility];  Service: Gynecology;  Laterality: N/A;   LITHOTRIPSY     paniculectomy     SHOULDER ARTHROSCOPY WITH SUBACROMIAL DECOMPRESSION AND OPEN ROTATOR C Left 07/16/2021   Procedure: LEFT ARTHROSCOPIC SUBACROMIAL DECOMPRESSION, MINI OPEN ROTATOR CUFF TEAR REPAIR, BICEPS TENOTOMY;  Surgeon: Anderson Maude ORN, MD;  [facility];  Service: Orthopedics;  Laterality: Left;   STONE EXTRACTION WITH BASKET Bilateral 03/06/2022   Procedure: STONE EXTRACTION WITH BASKET;  Surgeon: Sherrilee Belvie CROME, MD;  [facility];  Service: Urology;  Laterality: Bilateral;   TOTAL KNEE ARTHROPLASTY Left 04/17/2023   Procedure: LEFT TOTAL KNEE ARTHROPLASTY;  Surgeon: Vernetta Lonni GRADE, MD;  [facility];  Service:  Orthopedics;  Laterality: Left;   TRIGGER FINGER RELEASE     WISDOM TOOTH EXTRACTION     Past Medical History:  Diagnosis Date   Allergy    Anemia    Anxiety    Arthritis    knees, multiple joints   Asthma    Bell palsy    Blood transfusion without reported diagnosis    Breast mass    lt breast mass x's 3 years increased in size   Chronic kidney disease    Chronic pain syndrome    COPD (chronic obstructive pulmonary disease) (HCC)    Depression    Diabetes mellitus    Endometrial cancer (HCC)    Fibromyalgia    GERD (gastroesophageal reflux disease)    H/O gastric bypass 05/09/2014   At [facility]   Headache    mirgraines   Heart murmur    Hyperlipidemia    Hypertension    Hypothyroidism    Multiple pulmonary nodules 11/30/2012   Followed in Pulmonary clinic/ [facility]/ [place]  - See CT abd  10/29/12  New right lower lobe pulmonary nodularity, primarily ground-  glass in density. This could reflect an inflammatory process,  although follow-up is necessary to exclude atypical neoplasm. Full  chest CT should be considered to evaluate for other pulmonary  findings.      Multiple thyroid  nodules    Nephropathy    Pneumonia    PONV (postoperative nausea and vomiting)    Pulmonary nodules    Thyroid  disease    Ulcer    Uterine cancer (HCC) 12/26/2011   Stage 1, grade 1, S/P hysterectomy initially by robotic technique and then salpingo-oophorectomy at [facility] receiving no postoperative treatment.   LMP 05/06/2011   Opioid Risk Score:   Fall Risk Score:  `1  Depression screen [facility] 2/9     12/15/2023   11:21 AM 10/15/2023   11:41 AM 08/11/2023   11:18 AM 04/07/2023   10:25 AM 03/02/2023   10:20 AM 01/02/2023    1:45 PM 09/15/2022   11:06 AM  Depression screen PHQ 2/9  Decreased Interest 2 0 1 1 0 1 2  Down, Depressed, Hopeless 2 0 1 1 0 1 2  PHQ - 2 Score 4 0 2 2 0 2 4  Altered sleeping 2  Tired, decreased energy 2        Change in appetite 2        Feeling bad  or failure about yourself  1        Trouble concentrating 0        Moving slowly or fidgety/restless 0        Suicidal thoughts 0        PHQ-9 Score 11         Difficult doing work/chores Very difficult           Data saved with a previous flowsheet row definition      Review of Systems  Musculoskeletal:  Positive for back pain, gait problem and neck pain.       B/L hands, knees, shoulder and B/L foot pain  All other systems reviewed and are negative.      Objective:   Physical Exam   Gen: no distress, normal appearing HEENT: oral mucosa pink and moist, NCAT Chest: normal effort, normal rate of breathing Abd: soft, non-distended Ext: no edema Psych:  pleasant and appropriate  Skin: intact Neuro: Alert and awake, follows commands, cranial nerves II through XII grossly intact  Moving all 4 extremities to gravity and resistance Musculoskeletal:  On examination, there is tenderness to palpation in the upper lumbar and mid-thoracic spine. There is no tenderness over the lower lumbar spine. Straight leg raise is negative for radicular pain. Facet loading equivocal     Xray L knee result 03/10/22 Three-view radiographs of the left knee were obtained today.  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  She has periarticular  osteophytes no acute fractures are noted    Xray L knee result 12/02/21  Radiographs of her right knee in 3 projections were reviewed today.  She  has tricompartmental arthritis with most changes in the medial compartment  with near bone-on-bone.  Also patellofemoral sclerosis and osteophyte  formation no acute fractures     Xray L spine 05/23/24 Disc space narrowing and endplate remodeling throughout the lumbar spine in keeping with changes of diffuse mild-to-moderate degenerative disc disease.   SOFT  TISSUES: No acute abnormality.   IMPRESSION: 1. Diffuse mild-to-moderate degenerative disc disease throughout the lumbar spine.    Assessment & Plan:   Knee OA bilateral -Could consider genicular nerve block if she does not have surgery -S/p L TKA by Dr. Vernetta  04/17/23 -ORT low  -Continue UDS and pill counts.  Continue PDMP monitoring.  Pain contract completed prior visit. -Discussed bringing pill bottle with any medications even if empty to all appointments  -Continue Percocet 7.5 every 6 hours as needed, #120 ordered -Continue tizanidine  as needed -Continue gabapentin  1200 mg twice daily -Patient reports planning to get R knee replacement soon  B/L shoulder pain left greater then right -Followed by ortho Dr. Burnetta, Recent MRI of shoulder, surgery may be considered at later time -Continue medications as above  Fibromyalgia  -Continue tizanidine  as needed, will order tizanidine  2 mg tabs.  Pt to use 2 to 4 mg at night as needed and 2 mg during the day as needed.  -Continue gabapentin  1200 mg twice daily -Aquatic therapy -will order today -Zynex Nexwave device, Zynex Cryoheat blanket ordered prior visit - Recommend tai chi -She does have frequent stiffness in her joints , could consider rheumatology consult to rule out other rheumatological causes.  Chronic lower to mid back pain -Patient does have history of kidney stones but she says her urologist does not think back pain is related to this issue - X-ray L-spine with DDD, multilevel -Discussed non-invasive treatment options. Recommended a trial of physical therapy, specifically aquatic therapy given fibromyalgia history.core strengthening would be helpful.  The patient will contact the [facility] facility to schedule this. -Discussed imaging. An MRI of the back is an option if this does not improve.  The patient would like to hold off on this for now. "

## 2024-08-11 ENCOUNTER — Encounter: Attending: Physical Medicine & Rehabilitation | Admitting: Physical Medicine & Rehabilitation
